# Patient Record
Sex: Female | Born: 1950 | Race: Black or African American | Hispanic: No | Smoking: Former smoker
Health system: Southern US, Community
[De-identification: ages and names within clinical notes are randomized; demographics above are authoritative.]

## PROBLEM LIST (undated history)

## (undated) DIAGNOSIS — E785 Hyperlipidemia, unspecified: Secondary | ICD-10-CM

## (undated) DIAGNOSIS — G459 Transient cerebral ischemic attack, unspecified: Secondary | ICD-10-CM

## (undated) DIAGNOSIS — I499 Cardiac arrhythmia, unspecified: Secondary | ICD-10-CM

## (undated) DIAGNOSIS — I509 Heart failure, unspecified: Secondary | ICD-10-CM

## (undated) DIAGNOSIS — I639 Cerebral infarction, unspecified: Secondary | ICD-10-CM

## (undated) DIAGNOSIS — G8929 Other chronic pain: Secondary | ICD-10-CM

## (undated) DIAGNOSIS — F419 Anxiety disorder, unspecified: Secondary | ICD-10-CM

## (undated) DIAGNOSIS — I10 Essential (primary) hypertension: Secondary | ICD-10-CM

## (undated) DIAGNOSIS — K219 Gastro-esophageal reflux disease without esophagitis: Secondary | ICD-10-CM

## (undated) DIAGNOSIS — J45909 Unspecified asthma, uncomplicated: Secondary | ICD-10-CM

## (undated) DIAGNOSIS — M545 Low back pain, unspecified: Secondary | ICD-10-CM

## (undated) DIAGNOSIS — J189 Pneumonia, unspecified organism: Secondary | ICD-10-CM

## (undated) DIAGNOSIS — M1990 Unspecified osteoarthritis, unspecified site: Secondary | ICD-10-CM

## (undated) HISTORY — PX: TONSILLECTOMY: SUR1361

## (undated) HISTORY — PX: PATELLA FRACTURE SURGERY: SHX735

## (undated) HISTORY — PX: FRACTURE SURGERY: SHX138

---

## 1978-10-20 HISTORY — PX: VAGINAL HYSTERECTOMY: SUR661

## 1997-09-07 ENCOUNTER — Other Ambulatory Visit: Admission: RE | Admit: 1997-09-07 | Discharge: 1997-09-07 | Payer: Self-pay | Admitting: Family Medicine

## 1998-01-01 ENCOUNTER — Emergency Department (HOSPITAL_COMMUNITY): Admission: EM | Admit: 1998-01-01 | Discharge: 1998-01-01 | Payer: Self-pay | Admitting: Emergency Medicine

## 1998-01-01 ENCOUNTER — Encounter: Payer: Self-pay | Admitting: Emergency Medicine

## 1999-11-09 ENCOUNTER — Emergency Department (HOSPITAL_COMMUNITY): Admission: EM | Admit: 1999-11-09 | Discharge: 1999-11-09 | Payer: Self-pay | Admitting: Emergency Medicine

## 2000-04-16 ENCOUNTER — Ambulatory Visit (HOSPITAL_COMMUNITY): Admission: RE | Admit: 2000-04-16 | Discharge: 2000-04-16 | Payer: Self-pay

## 2001-05-11 ENCOUNTER — Ambulatory Visit (HOSPITAL_COMMUNITY): Admission: RE | Admit: 2001-05-11 | Discharge: 2001-05-11 | Payer: Self-pay | Admitting: Family Medicine

## 2001-09-06 ENCOUNTER — Emergency Department (HOSPITAL_COMMUNITY): Admission: EM | Admit: 2001-09-06 | Discharge: 2001-09-06 | Payer: Self-pay | Admitting: *Deleted

## 2002-05-13 ENCOUNTER — Ambulatory Visit (HOSPITAL_COMMUNITY): Admission: RE | Admit: 2002-05-13 | Discharge: 2002-05-13 | Payer: Self-pay | Admitting: Family Medicine

## 2002-07-02 ENCOUNTER — Ambulatory Visit (HOSPITAL_BASED_OUTPATIENT_CLINIC_OR_DEPARTMENT_OTHER): Admission: RE | Admit: 2002-07-02 | Discharge: 2002-07-02 | Payer: Self-pay | Admitting: *Deleted

## 2002-07-02 ENCOUNTER — Encounter (INDEPENDENT_AMBULATORY_CARE_PROVIDER_SITE_OTHER): Payer: Self-pay | Admitting: *Deleted

## 2002-07-09 ENCOUNTER — Emergency Department (HOSPITAL_COMMUNITY): Admission: EM | Admit: 2002-07-09 | Discharge: 2002-07-09 | Payer: Self-pay | Admitting: Emergency Medicine

## 2002-09-10 ENCOUNTER — Ambulatory Visit (HOSPITAL_COMMUNITY): Admission: RE | Admit: 2002-09-10 | Discharge: 2002-09-10 | Payer: Self-pay | Admitting: Sports Medicine

## 2002-09-10 ENCOUNTER — Encounter: Admission: RE | Admit: 2002-09-10 | Discharge: 2002-09-10 | Payer: Self-pay | Admitting: Family Medicine

## 2003-05-16 ENCOUNTER — Ambulatory Visit (HOSPITAL_COMMUNITY): Admission: RE | Admit: 2003-05-16 | Discharge: 2003-05-16 | Payer: Self-pay | Admitting: Sports Medicine

## 2003-09-18 ENCOUNTER — Emergency Department (HOSPITAL_COMMUNITY): Admission: EM | Admit: 2003-09-18 | Discharge: 2003-09-18 | Payer: Self-pay | Admitting: Emergency Medicine

## 2003-11-03 ENCOUNTER — Ambulatory Visit: Payer: Self-pay | Admitting: Family Medicine

## 2003-12-02 ENCOUNTER — Ambulatory Visit: Payer: Self-pay | Admitting: Family Medicine

## 2004-05-17 ENCOUNTER — Ambulatory Visit (HOSPITAL_COMMUNITY): Admission: RE | Admit: 2004-05-17 | Discharge: 2004-05-17 | Payer: Self-pay | Admitting: Family Medicine

## 2005-05-21 ENCOUNTER — Ambulatory Visit (HOSPITAL_COMMUNITY): Admission: RE | Admit: 2005-05-21 | Discharge: 2005-05-21 | Payer: Self-pay | Admitting: Sports Medicine

## 2005-08-05 ENCOUNTER — Emergency Department (HOSPITAL_COMMUNITY): Admission: EM | Admit: 2005-08-05 | Discharge: 2005-08-05 | Payer: Self-pay | Admitting: Family Medicine

## 2005-08-23 ENCOUNTER — Encounter: Admission: RE | Admit: 2005-08-23 | Discharge: 2005-08-23 | Payer: Self-pay | Admitting: Orthopedic Surgery

## 2005-09-25 ENCOUNTER — Emergency Department (HOSPITAL_COMMUNITY): Admission: EM | Admit: 2005-09-25 | Discharge: 2005-09-25 | Payer: Self-pay | Admitting: Emergency Medicine

## 2005-09-27 ENCOUNTER — Inpatient Hospital Stay (HOSPITAL_COMMUNITY): Admission: RE | Admit: 2005-09-27 | Discharge: 2005-09-29 | Payer: Self-pay | Admitting: Orthopaedic Surgery

## 2005-11-15 ENCOUNTER — Ambulatory Visit (HOSPITAL_COMMUNITY): Admission: RE | Admit: 2005-11-15 | Discharge: 2005-11-16 | Payer: Self-pay | Admitting: Orthopaedic Surgery

## 2006-04-17 DIAGNOSIS — E7800 Pure hypercholesterolemia, unspecified: Secondary | ICD-10-CM | POA: Insufficient documentation

## 2006-04-17 DIAGNOSIS — I10 Essential (primary) hypertension: Secondary | ICD-10-CM | POA: Insufficient documentation

## 2006-04-17 DIAGNOSIS — J309 Allergic rhinitis, unspecified: Secondary | ICD-10-CM | POA: Insufficient documentation

## 2006-05-31 ENCOUNTER — Emergency Department (HOSPITAL_COMMUNITY): Admission: EM | Admit: 2006-05-31 | Discharge: 2006-05-31 | Payer: Self-pay | Admitting: Emergency Medicine

## 2006-06-17 ENCOUNTER — Ambulatory Visit: Payer: Self-pay | Admitting: Family Medicine

## 2006-06-17 ENCOUNTER — Telehealth: Payer: Self-pay | Admitting: *Deleted

## 2006-07-02 ENCOUNTER — Ambulatory Visit: Payer: Self-pay | Admitting: Family Medicine

## 2006-07-02 DIAGNOSIS — R1310 Dysphagia, unspecified: Secondary | ICD-10-CM | POA: Insufficient documentation

## 2006-07-02 DIAGNOSIS — K219 Gastro-esophageal reflux disease without esophagitis: Secondary | ICD-10-CM | POA: Insufficient documentation

## 2006-08-29 ENCOUNTER — Telehealth: Payer: Self-pay | Admitting: *Deleted

## 2006-08-29 ENCOUNTER — Ambulatory Visit: Payer: Self-pay | Admitting: Family Medicine

## 2008-01-20 ENCOUNTER — Ambulatory Visit (HOSPITAL_COMMUNITY): Admission: RE | Admit: 2008-01-20 | Discharge: 2008-01-20 | Payer: Self-pay | Admitting: Orthopaedic Surgery

## 2008-02-01 ENCOUNTER — Encounter: Admission: RE | Admit: 2008-02-01 | Discharge: 2008-02-01 | Payer: Self-pay | Admitting: Orthopaedic Surgery

## 2008-03-18 ENCOUNTER — Ambulatory Visit (HOSPITAL_COMMUNITY): Admission: RE | Admit: 2008-03-18 | Discharge: 2008-03-18 | Payer: Self-pay | Admitting: Orthopaedic Surgery

## 2008-06-27 ENCOUNTER — Emergency Department (HOSPITAL_COMMUNITY): Admission: EM | Admit: 2008-06-27 | Discharge: 2008-06-27 | Payer: Self-pay | Admitting: Emergency Medicine

## 2009-09-06 ENCOUNTER — Emergency Department (HOSPITAL_COMMUNITY): Admission: EM | Admit: 2009-09-06 | Discharge: 2009-09-06 | Payer: Self-pay | Admitting: Emergency Medicine

## 2010-01-03 ENCOUNTER — Encounter: Payer: Self-pay | Admitting: Family Medicine

## 2010-03-22 NOTE — Assessment & Plan Note (Signed)
Summary: sore throat/ls   Vital Signs:  Patient Profile:   60 Years Old Female Height:     66.5 inches Temp:     98.6 degrees F oral Pulse rate:   88 / minute BP sitting:   133 / 77  Vitals Entered By: Lillia Pauls CMA (August 29, 2006 2:23 PM)               PCP:  [name]  MD  Chief Complaint:  SORE THROAT/ALLERGIES/COUGH.  History of Present Illness: cough and allergy problems since Tuesday.  Sore throat also began tuesday.  No fever.  Occasional chills.   SORE THROAT  Onset:Tues Severity:8/10 Better with: ibuprofen  Symptoms  Fever: no    Highest Temp: Cough/URI sxs: yes Myalgias: no Headache: yes Rash: no Swollen glands:    only on r side   Location:  Recent Strep Exposure:  daughter had 1 month ago.  LUQ pain:stomach pains but went away on its own.  Heartburn/brash: reflux.  Allergy Sxs: yes  Red Flags STD exposure: no Breathing difficulty:no Drooling:no Trismus:no Pt is taking advair and BP meds, zyrtec this am.  Prilosec as needed.         Past Surgical History:    Reviewed history from 04/17/2006 and no changes required:       Hysterectomy & BSO - 02/19/1980, Sinus surgery - 09/19/2002   Family History:    Reviewed history from 04/17/2006 and no changes required:       Fam hx positive for alcoholism. Mother with HTN.  Social History:    Reviewed history from 04/17/2006 and no changes required:       Lives in [place] w/ husband.  Married X 12 yrs. 2 children in good health. Occasional EtOH, no smoking.  Works at [employer].     Physical Exam  General:     Well-developed,well-nourished,in no acute distress; alert,appropriate and cooperative throughout examination Head:     Normocephalic and atraumatic without obvious abnormalities. No apparent alopecia or balding. Eyes:     No corneal or conjunctival inflammation noted. EOMI. Perrla.  Ears:      External ear exam shows no significant lesions or deformities.  Otoscopic examination reveals clear canals, tympanic membranes are intact bilaterally without bulging, retraction, inflammation or discharge. Hearing is grossly normal bilaterally. Nose:     External nasal examination shows no deformity or inflammation. Nasal mucosa are pink and moist without lesions or exudates. Mouth:     Oral mucosa and oropharynx without lesions or exudates.  Teeth in good repair. Neck:     No deformities, masses, or tenderness noted. Lungs:     Normal respiratory effort, chest expands symmetrically. Lungs are clear to auscultation, no crackles or wheezes. Heart:     Normal rate and regular rhythm. S1 and S2 normal without gallop, murmur, click, rub or other extra sounds.    Impression & Recommendations:  Problem # 1:  RHINITIS, ALLERGIC (ICD-477.9) Most likely flareup of allergies since patient has been moving boxes.  will treat with allegra D and Ibuprofen for pain.  will have pt return in 1 week if no improvement.  see pt discharge instructions.  Medications prescribed.  No fever.  no signs of strep throat.  No noted peritonsillar abscess.   Orders: [facility]- Est Level  3 (04540)    Patient Instructions: 1)  Take an allegra D daily and ibuprofen every 8 hours for pain.  drink plenty of fluids.  get rest.  wear a mask  when around dust.  If no better by  middle of next week return for followup appt.

## 2010-03-22 NOTE — Progress Notes (Signed)
Summary: WI request  Phone Note Call from Patient Call back at Home Phone [phone]   Reason for Call: Talk to Nurse Summary of Call: pt wants to be seen for possible strep Initial call taken by: Haydee Salter,  August 29, 2006 10:06 AM  Follow-up for Phone Call        pt reports sore throat for 3 days. no fever. appointment scheduled today. Follow-up by: [name] RN,  August 29, 2006 10:45 AM

## 2010-03-22 NOTE — Miscellaneous (Signed)
   Clinical Lists Changes  Problems: Removed problem of ASTHMA, UNSPECIFIED (ICD-493.90) 

## 2010-03-22 NOTE — Assessment & Plan Note (Signed)
Summary: asthma   Vital Signs:  Patient Profile:   60 Years Old Female Height:     66.5 inches Weight:      159.5 pounds O2 Sat:      97 % Temp:     97.2 degrees F Pulse rate:   88 / minute BP sitting:   168 / 82  (left arm)  Pt. in pain?   no  Vitals Entered By: [name] RN (June 17, 2006 3:54 PM)              Is Patient Diabetic? No   Serial Vital Signs/Assessments:                                PEF    PreRx  PostRx Time      O2 Sat  O2 Type     L/min  L/min  L/min   By           97  %   Room air                          Shannon SMITH RN   PCP:  [name]  MD  Chief Complaint:  asthma  and cough.  History of Present Illness: cc: asthma exacerbatin HPI 60 yo female not seen in clinic since 11/2003 here w/ asthma exacerbation.   1)Asthma:  See following section 2)HTN:  Pt elevated BP today.  Has not taken BP meds in over a month.  Previously on benezepril and clonidine.  Wants to start back on meds.    Asthma History:  Type of asthma visit: Acute       The patient's current asthma symptoms started increasing about 06/15/2006.  This asthma flare has been triggered by: URI/cold.  She is now complaining of cough, nocturnal symptoms, wheezing, dyspnea/SOB, and sputum production.  She denies fever, itchy/watery eyes, nasal congestion/drainage, and sinus pressure.        Pt in ED 05/29/06 for flare-up and received breathing treatment.  Has not been on controller Flovent for over a year.  Has been using albuterol 7 times daily. Over past number of months has been waking up almost nightly with cough and has had many days with heavy albuterol use.  Albuterol has helped over past few days.    Asthma Severity Index Determination:      Asthma severity index questions reveal continual symptoms during the day and frequent nighttime symptoms.        Risk Factors:  Tobacco use:  quit   Review of Systems      See HPI   Physical Exam  General:      Well-developed,well-nourished,in no acute distress; alert,appropriate and cooperative throughout examination.  NAD Lungs:     Prior to treatment: RR 20no intercostal retractions, no accessory muscle use, R wheezes, and L wheezes. Fair air movement.  Faint coarse BS bases bilaterally.    AFter treatment:  RR 18 with improvement in air movement, wheezes are stable.  Pt appears more comfortable.   Heart:     Normal rate and regular rhythm. S1 and S2 normal without gallop, murmur, click, rub or other extra sounds.    Impression & Recommendations:  Problem # 1:  ASTHMA, UNSPECIFIED (ICD-493.90) Assessment: Deteriorated Pt was tight w/ moderate wheezing bilaterallly.  Received nebulizer treatment w/ Atrovent/Albuterol x 1.  After treatment pt feels less tight.  PE is improved with improved air movement. Pulse ox 97%  Pt states feels MUCH better after treatment.    Patient instructed to go to ED or Urgent Care if symtpoms worsen after office hours Taught pt how to use Advair 250/50 and gave sample. Will follow-up in 1-2 weeks with PCP.  If improves may get prescription.  Refilled Albuterol inhaler.  Prednisone 40 mg x 5 days.   Pt has spacer.  Pt will need baseline peakflow at next visit.   Orders: Pulse Oximetry- [facility] [phone])  Pulmonary Functions Reviewed: O2 sat: 97 (06/17/2006)  Orders: Pulse Oximetry- [facility] ([id number]) Nebulizer- [facility] ([id number])   Problem # 2:  HYPERTENSION, BENIGN SYSTEMIC (ICD-401.1) Assessment: Deteriorated Will start HCTZ 25 mg by mouth daily.  Will defer further meds including clonidine to her PCP.   BP today: 168/82   Problem # 3:  U R I (ICD-465.9) Assessment: New Supporative care with fluids and continued treatment of asthma Instructed on symptomatic treatment. Call if symptoms persist or worsen.   Other Orders: [facility]- Est  Level 4 (09811)   Patient Instructions: 1)  Please schedule a follow-up appointment in 1-2 weeks with Dr [name]).  2)  Use Advair Diskus as shown in clinic once in morning and once at night.  If it begins to run out before your appointment call clinic for prescription. 3)  Use Albuterol inhaler every 4-6 hours for next 1-2 days and then only as needed. 4)  Take Prednisone, two pills daily with meals.  (20mg  each) 5)  Take HCTZ 25 mg daily

## 2010-03-22 NOTE — Progress Notes (Signed)
Summary: wi request  Phone Note Call from Patient Call back at [phone]   Reason for Call: Talk to Nurse Summary of Call: pt is requesting wi appt, she sts she has asthma & is out of medication, pt has not been here in a while Initial call taken by: ERIN LEVAN,  June 17, 2006 2:54 PM  Follow-up for Phone Call        Appt Scheduled Today Follow-up by: Golden Circle RN,  June 17, 2006 3:02 PM

## 2010-06-04 LAB — CBC
HCT: 39.2 % (ref 36.0–46.0)
Hemoglobin: 13.1 g/dL (ref 12.0–15.0)
MCHC: 33.3 g/dL (ref 30.0–36.0)
MCV: 94.8 fL (ref 78.0–100.0)
Platelets: 266 10*3/uL (ref 150–400)
RBC: 4.13 MIL/uL (ref 3.87–5.11)
RDW: 14.7 % (ref 11.5–15.5)
WBC: 4.4 10*3/uL (ref 4.0–10.5)

## 2010-06-04 LAB — BASIC METABOLIC PANEL
BUN: 13 mg/dL (ref 6–23)
CO2: 25 mEq/L (ref 19–32)
Calcium: 9.8 mg/dL (ref 8.4–10.5)
Chloride: 110 mEq/L (ref 96–112)
Creatinine, Ser: 0.8 mg/dL (ref 0.4–1.2)
GFR calc Af Amer: >60 mL/min (ref >60)
GFR calc non Af Amer: >60 mL/min (ref >60)
Glucose, Bld: 87 mg/dL (ref 70–99)
Potassium: 3.8 mEq/L (ref 3.5–5.1)
Sodium: 144 mEq/L (ref 135–145)

## 2010-07-03 NOTE — Op Note (Signed)
Shannon [name], Shannon [name]            ACCOUNT NO.:  [id number]   MEDICAL RECORD NO.:  [id number]          PATIENT TYPE:  AMB   LOCATION:  SDS                          FACILITY:  [facility]   PHYSICIAN:  Vanita Panda. Magnus Ivan, M.D.DATE OF BIRTH:  [birth year]   DATE OF PROCEDURE:  03/18/2008  DATE OF DISCHARGE:  03/18/2008                               OPERATIVE REPORT   PREOPERATIVE DIAGNOSES:  1. Retained, painful prominent hardware, right patella status post      open reduction and internal fixation, right patella fracture.  2. Right knee patellofemoral chondromalacia.   POSTOPERATIVE DIAGNOSES:  1. Retained, painful prominent hardware, right patella status post      open reduction and internal fixation, right patella fracture.  2. Right knee patellofemoral chondromalacia.   PROCEDURE:  1. Removal of prominent screws, right patella x2.  2. Right knee arthroscopy with debridement and chondroplasty of the      patella.   SURGEON:  Vanita Panda. Magnus Ivan, MD   ANESTHESIA:  General.   ANTIBIOTICS:  One gram IV Ancef.   TOURNIQUET TIME:  57 minutes.   BLOOD LOSS:  Minimal.   COMPLICATIONS:  None.   INDICATIONS:  Briefly, Ms. Asmus is a 60 year old female who 2  years ago fell in a work-related accident landing on her right knee.  She had to undergo open reduction and internal fixation of patella  fracture.  She over the time has developed increasingly painful  chondromalacia of the patella with grinding of her knee.  Her x-ray  showed evidence that the patella fracture had healed completely.  There  was some painful prominence of hardware and she is a very thin  individual.  I recommended she undergo hardware removal with an  arthroscopic surgery of the knee to debride cartilage and smooth out  cartilage hanging from underneath the patella.  The risks and benefits  of surgery were explained to her in length and she agrees to surgery.   PROCEDURE:  After informed consent  was obtained, appropriate right leg  was marked.  She was brought to the operating room and placed supine on  the operating table.  General anesthesia was then obtained.  A  nonsterile tourniquet was placed around her upper right thigh and her  leg was prepped and draped with DuraPrep and sterile drapes including  sterile stockinette.  A lateral leg post was also utilized.  A time-out  was called to identify the correct patient and correct right knee.  I  then used an Esmarch to wrap out the leg and tourniquet was inflated to  300 mm pressure.  I made an incision directly over the previous incision  over the patella.  I was able to dissect down and identified 2 screw  heads that I removed easily without complications.  I then put the knee  through flexion and extension arc and patella was intact.  I debrided  areas of prominent osteophytes around the external aspect of the  patella.  I then proceeded with arthroscopic portion of the case.  Two  separate stab incisions were then made for arthroscopic portal  management.  An anterolateral portal was first placed and a camera was  inserted through this side.  I went directly the medial compartment and  made an anterior medial portal for arthroscopic shaver to be placed.  The medial compartment was then assessed and had just mild arthritic  changes.  The ACL and PCL were intact.  The lateral compartment had  grade 4 chondromalacia of the tibial plateau.  Finally, the undersurface  of the patella was assessed and there was significant grade 4  chondromalacia of patella.  Using an arthroscopic shaver, I was able to  debride back the cartilage to a stable margin and there were no further  flaps of cartilage hanging down and this did made the surface appear to  be more smooth.  I then removed all instrumentation from the knee and  drained the knee of an effusion.  I closed the portal sites with  interrupted 3-0 nylon suture.  I then closed deep  tissue of the knee  with 0 Vicryl, followed by 2-0 Vicryl in the subcutaneous tissue and  then a running 4-0 Monocryl subcuticular suture.  Steri-Strips were then  placed over this incision.  A mixture of clonidine, morphine, and  Marcaine was then inserted to the knee joint itself.  Sterile dressing  was then applied including sterile Ace wrap and tourniquet was let down  at 57 minutes and the toes pinked nicely.  The patient's knee was placed  in a knee immobilizer.  She was awakened, extubated, and taken to  recovery room in stable condition.  All final counts were correct.  There were no complications noted.      Vanita Panda. Magnus Ivan, M.D.  Electronically Signed     CYB/MEDQ  D:  03/18/2008  T:  03/18/2008  Job:  [id number]

## 2010-07-06 NOTE — Op Note (Signed)
NAMEMarland Kitchen  Shannon, [name] NO.:  [id number]   MEDICAL RECORD NO.:  [id number]          PATIENT TYPE:  OIB   LOCATION:  5028                         FACILITY:  [facility]   PHYSICIAN:  Vanita Panda. Magnus Ivan, M.D.DATE OF BIRTH:  01/20/1951   DATE OF PROCEDURE:  09/27/2005  DATE OF DISCHARGE:                                 OPERATIVE REPORT   PREOPERATIVE DIAGNOSIS:  Right comminuted patellar fracture.   POSTOPERATIVE DIAGNOSIS:  Right comminuted patellar fracture.   PROCEDURE:  1. Right knee partial patellectomy.  2. Right knee advancement of patellar tendon, with repair of retinaculum      and patellar tendon repair.   SURGEON:  Vanita Panda. Magnus Ivan, M.D.   ANESTHESIA:  General.   TOURNIQUET TIME:  1 hour and 36 minutes.   ANTIBIOTICS:  1 gram IV Ancef.   BLOOD LOSS:  Minimal.   COMPLICATIONS:  None.   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  She had the  inability to extend her knee and was seen in the emergency room by the ER  staff.  She was found to have a comminuted patellar fracture, that seemed to  involve more of the inferior pole of the patella involving 2 pieces within a  large portion of the patella remaining.  When I saw her in the office she  demonstrated the inability to extend her knee, and the x-rays did show the  aforementioned fracture.  It was recommended that she undergo attempted  repair versus a partial patellectomy with advancement of the patellar  tendon.  The risks and benefits of this were explained to her.  She agreed  to proceed with surgery.   PROCEDURE:  After informed consent was obtained and appropriate right knee  was marked, [name] was brought to the operating room and placed  supine on the operating table.  A nonsterile tourniquet was placed around  her upper leg.  General anesthesia was then obtained.  Her leg was  prepped  and draped with DuraPrep and sterile drapes, including a sterile  stockinette.  Before wrapping out her knee and inflating the tourniquet, she  was identified as the correct patient and the correct extremity.  The leg  was wrapped out with Esmarch and the tourniquet was inflated 300 mmHg  pressure.  A midline incision was then made directly over the patella,  carried down to the patellar tubercle; immediately a large hematoma was  noted.  There was extensive tearing of the retinaculum on both sides of her  knee and the large portion of the patella was remaining.  I did a medial  parapatellar arthrotomy, to be able to assess the undersurface of the  patella and the cartilage -- and the majority of the cartilage was intact.  There were two separate inferior pole pieces, and these could not be  repaired.  I excised some of the tissue, but left a remnant of bone for  advancement of the  patellar tendon.  I then ran a Krakow-type of suture  using a #2 FiberWire through the patellar tendon, with four strands of the  Krakow-type of suture.  I then placed 3 individual drill holes parallel  through the patella, at the mid side of the patella laterally, medially and  then the central part.  I then brought these sutures through 3 separate  drill holes, with 2 sutures through the medial middle drill hole and one  suture through each medial and lateral drill hole.  These were then tied  over a bony bridge at the superior pole of the patella to advance the  tendon.  I then oversewed the retinaculum on both sides of the knee with  interrupted 5-0 Ethibond suture, followed by #1 Ethibond suture to reinforce  the repair.  I then repaired several other small areas with 0 Vicryl suture.  The wound was copiously irrigated, and I did run fluid through the knee  before closing the arthrotomy with #1 Ethibond suture.  The soft tissues  were then irrigated again and I closed the deep soft tissues with 0  Vicryl,  followed by 2-0 Vicryl of the subcutaneous tissue, and staples on the skin.  Xerofoam followed by a well-padded sterile dressing was applied.  I then  placed her back in a knee immobilizer; the tourniquet was let down at 1 hour  and 36 minutes.  The toes did pinken nicely and there was palpable pulse in  her foot.  She was then awakened, extubated and taken to the recovery room  in stable condition.           ______________________________  Vanita Panda. Magnus Ivan, M.D.     CYB/MEDQ  D:  09/27/2005  T:  09/28/2005  Job:  [id number]

## 2010-07-06 NOTE — Op Note (Signed)
NAME:  Shannon [name], Shannon [name]                      ACCOUNT NO.:  [id number]   MEDICAL RECORD NO.:  [id number]                   PATIENT TYPE:  AMB   LOCATION:  DSC                                  FACILITY:  [facility]   PHYSICIAN:  Kathy Breach, M.D.                   DATE OF BIRTH:  [birth year]   DATE OF PROCEDURE:  07/02/2002  DATE OF DISCHARGE:                                 OPERATIVE REPORT   PREOPERATIVE DIAGNOSES:  1. Deviated nasal septum.  2. Hyperplasia, nasal turbinates.   POSTOPERATIVE DIAGNOSES:  1. Deviated nasal septum.  2. Hyperplasia, nasal turbinates.  3. Nasal sinus polyps.   PROCEDURES:  1. Nasal septoplasty.  2. Submucous resection, inferior turbinates bilaterally.  3. Bilateral intranasal ethmoidectomy for removal of nasal ethmoid polyps.   DESCRIPTION OF PROCEDURE:  With the patient under general orotracheal  anesthesia, a nasal block was applied with 4% Xylocaine with ephedrine-  soaked olive-tipped probes applied to the sphenopalatine and anterior  ethmoid nerve areas.  The patient's nose is completely obstructed by pale,  boggy congestion as well as marked deviation of the nasal septum to the  left.  Nose decongested more favorably than anticipated with the topical  block application.  The nasal septum was infiltrated with 1% Xylocaine and  1:100,000 epinephrine.  Cotton pledgets soaked with 4% Xylocaine and  ephedrine solution inserted along the turbinates, and later in the procedure  the inferior turbinates were infiltrated with 1% Xylocaine with 1:100,000  epinephrine for vasoconstrictive effect.  Once the patient's nose  significantly decongested, inspection revealed that there was sharp 4+  deviation of the anterior nasal septum of the quadrilateral cartilage to the  junction of the perpendicular ethmoid plate area to the left.  The middle  and inferior turbinates were markedly hyperplastic, right greater than left.  The patient also had polypoid changes  and some polyps visibly extending from  the middle meatus and carried out on the left side, visible after proceeding  with the septoplasty.  A caudal incision was made inside the left nasal  vestibule, and mucosal flaps were elevated off the cartilaginous and bony  septum bilaterally.  A couple of tears along the sharp deflections on the  left side occurred, but the right septum mucosa was kept entirely intact  without problem.  An incision was made removing the posterior third of the  quadrilateral cartilage, the perpendicular ethmoid plate, vomer and  maxillary crest spur, all extending markedly to the left, bringing the nasal  septum to the near-midline position and gaining visualization of the entire  nose on the left side.  A stab incision made over the anterior aspect of the  right inferior turbinate.  A superiorly-based mucosal flap was elevated, and  the lower two-thirds of the bone and the adjacent mucosa were sharply  excised.  Hemostasis with the suction cautery along the bony and mucosal  edges.  The remaining turbinate bone was outfractured.  A similar procedure  on the left side, decreasing the left inferior turbinate by 50%, somewhat  less than on the right.  At this point the obvious middle meatal polyps  presented on the left side, and marked polypoid degeneration of the mucosa  of the middle meatus of the right side was addressed, choosing to proceed  with removal of the polyps, extended into a complete nasal ethmoidectomy  being completed bilaterally, removing mucoid and polyps from the ethmoid  cells anteriorly and posteriorly bilaterally.  The middle turbinates, though  very more prominent right much more so than the left, were kept intact.  At  completion of the case the septal incision was closed with interrupted 4-0  chromic catgut sutures.  The nose was packed bilaterally with Vaseline  gauze, impregnated with bacitracin.  The patient's nasopharynx was suctioned   clear.  No active bleeding in the nasopharynx was present at the completion  of the case.  Blood loss in the suction canister greater than 50 to less  than 100 mL.  The patient tolerated the procedure well, was taken to the  recovery room in stable general condition.                                               Kathy Breach, M.D.    Shannon [name]  D:  07/02/2002  T:  07/03/2002  Job:  [id number]

## 2010-07-06 NOTE — Op Note (Signed)
Shannon [name], Shannon [name] NO.:  [id number]   MEDICAL RECORD NO.:  [id number]          PATIENT TYPE:  OIB   LOCATION:  5017                         FACILITY:  [facility]   PHYSICIAN:  Vanita Panda. Magnus Ivan, M.D.DATE OF BIRTH:  12-06-1950   DATE OF PROCEDURE:  11/15/2005  DATE OF DISCHARGE:  11/16/2005                                 OPERATIVE REPORT   PREOPERATIVE DIAGNOSIS:  Right patella fracture failed fixation.   POSTOPERATIVE DIAGNOSIS:  Right patella fracture failed fixation.   PROCEDURE:  Revision open reduction, internal fixation of right inferior  pole patella fracture.   SURGEON:  Vanita Panda. Magnus Ivan, M.D.   ANESTHESIA:  General.   ANTIBIOTICS:  1 gram IV Ancef.   BLOOD LOSS:  Minimal.   TOURNIQUET TIME:  1 hour and 45 minutes.   COMPLICATIONS:  None.   INDICATIONS:  Briefly, Ms. Botsford is a 60 year old, who around 6 weeks  ago, sustained a patella fracture in a fall.  This was of her right knee.  I  took her to the operating room and was able to advance the inferior pole of  patella piece through and with 3 drills and large #2 FiberWire suture, was  able to reapproximate the patellar tendon, and then I treated in knee  immobilization.  I did allow her to start bending the knee to just 30  degrees in hinged knee brace, but then she sustained a fall and came to my  office, and I found the inferior pole piece had pulled off completely.  I  recommended she undergo a partial patellectomy versus revision  osteosynthesis of the knee, and she agreed to proceed with surgery.   PROCEDURE DESCRIPTION:  After informed consent was obtained, the risks,  benefits, and surgery were explained to her in detail.  Her right leg was  marked, and she was brought to the operating room and placed supine on the  operating table.  A nonsterile tourniquet was placed around her upper leg,  and then her leg was prepped and draped with DuraPrep, and sterile drapes  including a sterile stockinette.  Prior to making an incision, a time-out  was called.  She was identified as the correct patient and the correct  extremity.  An Esmarch was used to wrap the leg, and tourniquet was inflated  to 300 mm of pressure.  I went through her original incision sharply with a  knife and carried this down to the level of the patella fracture, dividing  the soft tissue.  There was a gap at fracture site, and the 2 inferior pole  pieces had better consolidated.  I was able to curette the fracture site  itself and hopefully get to some bleeding bone.  I then was able to hold the  inferior pole pieces with reduction forceps and then placed 2 K-wires from  inferior to superior and over these drilled for 4-0 cannulated screws.  These were long partially threaded screws, and I placed two 4-0 cannulated  screws in a parallel format.  I then ran a FiberWire through these from a  tension band standpoint as well and  then oversewed the edges of the patellar  tendon.  I used fluoroscopy to assure that the screws did not penetrate the  articular surface.  I then thoroughly irrigated the wound, including  cleaning out a small arthrotomy that I had made to put my finger underneath  the patella to assure that it was a smooth surface.  The wound was then  copiously irrigated again, and I closed the arthrotomy sites with  interrupted 0 Vicryl suture followed by 0 Vicryl in the deep tissue, 2-0  Vicryl in the subcutaneous tissue, and staples on the skin.  Well-padded  sterile dressing was applied.  The patient's leg was placed in knee  immobilizer and tourniquet was let down.  The toes did pink nicely.  The  patient was awakened, extubated, and taken to recovery room in stable  condition.  Again, all final counts were correct, and there were no  complications noted.           ______________________________  Vanita Panda. Magnus Ivan, M.D.     CYB/MEDQ  D:  11/15/2005  T:  11/17/2005   Job:  [id number]

## 2010-08-10 ENCOUNTER — Emergency Department (HOSPITAL_COMMUNITY)
Admission: EM | Admit: 2010-08-10 | Discharge: 2010-08-11 | Disposition: A | Discharge status: Home / Self Care | Payer: Self-pay | Attending: Emergency Medicine | Admitting: Emergency Medicine

## 2010-08-10 DIAGNOSIS — I10 Essential (primary) hypertension: Secondary | ICD-10-CM | POA: Insufficient documentation

## 2010-08-10 DIAGNOSIS — R0602 Shortness of breath: Secondary | ICD-10-CM | POA: Insufficient documentation

## 2010-08-10 DIAGNOSIS — R0609 Other forms of dyspnea: Secondary | ICD-10-CM | POA: Insufficient documentation

## 2010-08-10 DIAGNOSIS — R0789 Other chest pain: Secondary | ICD-10-CM | POA: Insufficient documentation

## 2010-08-10 DIAGNOSIS — J45909 Unspecified asthma, uncomplicated: Secondary | ICD-10-CM | POA: Insufficient documentation

## 2010-08-10 DIAGNOSIS — R0989 Other specified symptoms and signs involving the circulatory and respiratory systems: Secondary | ICD-10-CM | POA: Insufficient documentation

## 2010-12-04 ENCOUNTER — Other Ambulatory Visit: Payer: Self-pay | Admitting: Internal Medicine

## 2010-12-04 DIAGNOSIS — Z1231 Encounter for screening mammogram for malignant neoplasm of breast: Secondary | ICD-10-CM

## 2010-12-20 ENCOUNTER — Ambulatory Visit (HOSPITAL_COMMUNITY)
Admission: RE | Admit: 2010-12-20 | Discharge: 2010-12-20 | Disposition: A | Discharge status: Home / Self Care | Payer: Self-pay | Source: Ambulatory Visit | Attending: Internal Medicine | Admitting: Internal Medicine

## 2010-12-20 DIAGNOSIS — Z1231 Encounter for screening mammogram for malignant neoplasm of breast: Secondary | ICD-10-CM | POA: Insufficient documentation

## 2012-01-09 ENCOUNTER — Other Ambulatory Visit (HOSPITAL_COMMUNITY): Payer: Self-pay | Admitting: Internal Medicine

## 2012-01-13 ENCOUNTER — Other Ambulatory Visit (HOSPITAL_COMMUNITY): Payer: Self-pay | Admitting: Internal Medicine

## 2012-01-13 DIAGNOSIS — Z1231 Encounter for screening mammogram for malignant neoplasm of breast: Secondary | ICD-10-CM

## 2012-01-30 ENCOUNTER — Ambulatory Visit (HOSPITAL_COMMUNITY)
Admission: RE | Admit: 2012-01-30 | Discharge: 2012-01-30 | Disposition: A | Discharge status: Home / Self Care | Payer: Self-pay | Source: Ambulatory Visit | Attending: Internal Medicine | Admitting: Internal Medicine

## 2012-01-30 DIAGNOSIS — Z1231 Encounter for screening mammogram for malignant neoplasm of breast: Secondary | ICD-10-CM

## 2012-09-20 ENCOUNTER — Emergency Department (HOSPITAL_COMMUNITY): Payer: No Typology Code available for payment source

## 2012-09-20 ENCOUNTER — Encounter (HOSPITAL_COMMUNITY): Payer: Self-pay | Admitting: *Deleted

## 2012-09-20 ENCOUNTER — Emergency Department (HOSPITAL_COMMUNITY)
Admission: EM | Admit: 2012-09-20 | Discharge: 2012-09-21 | Disposition: A | Discharge status: Home / Self Care | Payer: No Typology Code available for payment source | Attending: Emergency Medicine | Admitting: Emergency Medicine

## 2012-09-20 DIAGNOSIS — J45909 Unspecified asthma, uncomplicated: Secondary | ICD-10-CM | POA: Insufficient documentation

## 2012-09-20 DIAGNOSIS — I10 Essential (primary) hypertension: Secondary | ICD-10-CM | POA: Insufficient documentation

## 2012-09-20 DIAGNOSIS — J40 Bronchitis, not specified as acute or chronic: Secondary | ICD-10-CM | POA: Insufficient documentation

## 2012-09-20 HISTORY — DX: Unspecified asthma, uncomplicated: J45.909

## 2012-09-20 HISTORY — DX: Essential (primary) hypertension: I10

## 2012-09-20 MED ORDER — IPRATROPIUM BROMIDE HFA 17 MCG/ACT IN AERS
2.0000 | INHALATION_SPRAY | Freq: Once | RESPIRATORY_TRACT | Status: DC
  Filled 2012-09-20: qty 12.9

## 2012-09-20 MED ORDER — PREDNISONE 20 MG PO TABS
60.0000 mg | ORAL_TABLET | Freq: Once | ORAL | Status: AC
  Administered 2012-09-21: 60 mg via ORAL
  Filled 2012-09-20: qty 3

## 2012-09-20 MED ORDER — ALBUTEROL SULFATE (5 MG/ML) 0.5% IN NEBU
5.0000 mg | INHALATION_SOLUTION | Freq: Once | RESPIRATORY_TRACT | Status: DC
  Filled 2012-09-20: qty 1

## 2012-09-20 MED ORDER — ALBUTEROL SULFATE (5 MG/ML) 0.5% IN NEBU
5.0000 mg | INHALATION_SOLUTION | Freq: Once | RESPIRATORY_TRACT | Status: AC
  Administered 2012-09-21: 5 mg via RESPIRATORY_TRACT
  Filled 2012-09-20 (×2): qty 1

## 2012-09-20 NOTE — ED Notes (Signed)
Hx asthma. Currently out of medication

## 2012-09-20 NOTE — ED Notes (Signed)
Pt c/o SOB x 3 days progressively worsening, with productive cough.

## 2012-09-20 NOTE — ED Provider Notes (Signed)
CSN: [id number]     Arrival date & time 09/20/12  2311 History     First MD Initiated Contact with Patient 09/20/12 2327     Chief Complaint  Patient presents with  . Shortness of Breath   HPI  History provided by the patient. Patient is a 62 year old female with history of hypertension, hypercholesterolemia and asthma who presents with complaints of worsening cough, shortness of breath and wheezing for the past 3 days. Patient reports running out of her albuterol and Advair inhalers most 3 weeks ago. She has done generally well until the last 3 days. She has had a productive clear to yellow phlegm cough without hemoptysis. She denies significant nasal congestion or rhinorrhea. No sore throat. No associated fever, chills or sweats. She has not used any other treatments for symptoms. Denies any other aggravating or alleviating factors. No other associated symptoms. No chest pains, nausea, vomiting or diarrhea. No recent travel. No known sick contacts.   Past Medical History  Diagnosis Date  . Hypertension   . Asthma    No past surgical history on file. No family history on file. History  Substance Use Topics  . Smoking status: Not on file  . Smokeless tobacco: Not on file  . Alcohol Use: Not on file   OB History   Grav Para Term Preterm Abortions TAB SAB Ect Mult Living                 Review of Systems  Constitutional: Negative for fever, chills and unexpected weight change.  HENT: Negative for congestion, sore throat and rhinorrhea.   Respiratory: Positive for cough, shortness of breath and wheezing.   Cardiovascular: Negative for chest pain and palpitations.  Gastrointestinal: Negative for nausea, vomiting, abdominal pain and diarrhea.  All other systems reviewed and are negative.    Allergies  Review of patient's allergies indicates no known allergies.  Home Medications  No current outpatient prescriptions on file. BP 153/88  Pulse 93  Temp(Src) 97.7 F (36.5 C)  (Oral)  Resp 28  SpO2 97% Physical Exam  Nursing note and vitals reviewed. Constitutional: She is oriented to person, place, and time. She appears well-developed and well-nourished. No distress.  HENT:  Head: Normocephalic.  Mouth/Throat: Oropharynx is clear and moist.  Neck: Normal range of motion. Neck supple.  No meningeal signs.  Cardiovascular: Normal rate and regular rhythm.   Pulmonary/Chest: Effort normal. She has wheezes.  Expiratory wheezes  Abdominal: Soft. There is no tenderness. There is no rebound and no guarding.  Neurological: She is alert and oriented to person, place, and time.  Skin: Skin is warm and dry. No rash noted.  Psychiatric: She has a normal mood and affect. Her behavior is normal.    ED Course   Procedures     Dg Chest 2 View  09/20/2012   *RADIOLOGY REPORT*  Clinical Data: Shortness of breath, cough, asthma  CHEST - 2 VIEW  Comparison: 03/16/2008  Findings: Stable mild hyperinflation.  Normal heart size and vascularity.  No CHF, pneumonia, effusion or pneumothorax.  Trachea midline.  IMPRESSION: Stable hyperinflation.   Original Report Authenticated By: Judie Petit. Shick, M.D.      1. Bronchitis     MDM  11:25PM patient seen and evaluated. Patient with frequent occasional coughing. Appears well otherwise without any respiratory distress. Expiratory wheezing throughout. No other concerning symptoms.  Ran out of albuterol nad advair 3 wks ago  Pt feeling better after breathing treatment.  CXR without concerns for  PNA at this time. Pt does feel a second treatment would help and I will order.      Angus Seller, PA-C 09/21/12 [phone]

## 2012-09-21 ENCOUNTER — Encounter (HOSPITAL_COMMUNITY): Payer: Self-pay | Admitting: *Deleted

## 2012-09-21 MED ORDER — ALBUTEROL SULFATE HFA 108 (90 BASE) MCG/ACT IN AERS: 2.0000 | INHALATION_SPRAY | RESPIRATORY_TRACT | Status: DC | PRN

## 2012-09-21 MED ORDER — IPRATROPIUM BROMIDE 0.02 % IN SOLN
0.5000 mg | Freq: Once | RESPIRATORY_TRACT | Status: AC
  Administered 2012-09-21: 0.5 mg via RESPIRATORY_TRACT
  Filled 2012-09-21: qty 2.5

## 2012-09-21 MED ORDER — FLUTICASONE-SALMETEROL 250-50 MCG/DOSE IN AEPB: 1.0000 | INHALATION_SPRAY | Freq: Two times a day (BID) | RESPIRATORY_TRACT | Status: DC

## 2012-09-21 MED ORDER — ALBUTEROL SULFATE (5 MG/ML) 0.5% IN NEBU
5.0000 mg | INHALATION_SOLUTION | Freq: Once | RESPIRATORY_TRACT | Status: AC
  Administered 2012-09-21: 5 mg via RESPIRATORY_TRACT
  Filled 2012-09-21 (×2): qty 1

## 2012-09-21 NOTE — ED Provider Notes (Signed)
Medical screening examination/treatment/procedure(s) were performed by non-physician practitioner and as supervising physician I was immediately available for consultation/collaboration.  Jones Skene, M.D.     Jones Skene, MD 09/21/12 1610

## 2012-11-13 ENCOUNTER — Ambulatory Visit: Payer: No Typology Code available for payment source | Admitting: Cardiovascular Disease

## 2012-12-09 ENCOUNTER — Ambulatory Visit: Payer: No Typology Code available for payment source | Admitting: Cardiovascular Disease

## 2012-12-22 ENCOUNTER — Encounter: Payer: Self-pay | Admitting: Cardiovascular Disease

## 2013-01-07 ENCOUNTER — Other Ambulatory Visit (HOSPITAL_COMMUNITY): Payer: Self-pay | Admitting: Family Medicine

## 2013-01-07 DIAGNOSIS — Z1231 Encounter for screening mammogram for malignant neoplasm of breast: Secondary | ICD-10-CM

## 2013-02-01 ENCOUNTER — Ambulatory Visit (HOSPITAL_COMMUNITY)
Admission: RE | Admit: 2013-02-01 | Discharge: 2013-02-01 | Disposition: A | Discharge status: Home / Self Care | Payer: No Typology Code available for payment source | Source: Ambulatory Visit | Attending: Family Medicine | Admitting: Family Medicine

## 2013-02-01 DIAGNOSIS — Z1231 Encounter for screening mammogram for malignant neoplasm of breast: Secondary | ICD-10-CM | POA: Insufficient documentation

## 2014-01-26 ENCOUNTER — Other Ambulatory Visit (HOSPITAL_COMMUNITY): Payer: Self-pay | Admitting: Nurse Practitioner

## 2014-01-26 DIAGNOSIS — Z1231 Encounter for screening mammogram for malignant neoplasm of breast: Secondary | ICD-10-CM

## 2014-02-04 ENCOUNTER — Ambulatory Visit (HOSPITAL_COMMUNITY)
Admission: RE | Admit: 2014-02-04 | Discharge: 2014-02-04 | Disposition: A | Discharge status: Home / Self Care | Payer: No Typology Code available for payment source | Source: Ambulatory Visit | Attending: Nurse Practitioner | Admitting: Nurse Practitioner

## 2014-02-04 DIAGNOSIS — Z1231 Encounter for screening mammogram for malignant neoplasm of breast: Secondary | ICD-10-CM | POA: Diagnosis present

## 2014-05-26 ENCOUNTER — Emergency Department (HOSPITAL_COMMUNITY): Payer: Medicare HMO

## 2014-05-26 ENCOUNTER — Inpatient Hospital Stay (HOSPITAL_COMMUNITY): Payer: Medicare HMO

## 2014-05-26 ENCOUNTER — Encounter (HOSPITAL_COMMUNITY): Payer: Self-pay | Admitting: *Deleted

## 2014-05-26 ENCOUNTER — Inpatient Hospital Stay (HOSPITAL_COMMUNITY)
Admission: EM | Admit: 2014-05-26 | Discharge: 2014-05-27 | DRG: 392 | Disposition: A | Discharge status: Home / Self Care | Payer: Medicare HMO | Attending: Internal Medicine | Admitting: Internal Medicine

## 2014-05-26 DIAGNOSIS — J45909 Unspecified asthma, uncomplicated: Secondary | ICD-10-CM | POA: Diagnosis present

## 2014-05-26 DIAGNOSIS — I10 Essential (primary) hypertension: Secondary | ICD-10-CM | POA: Diagnosis present

## 2014-05-26 DIAGNOSIS — R0789 Other chest pain: Secondary | ICD-10-CM | POA: Diagnosis present

## 2014-05-26 DIAGNOSIS — E7800 Pure hypercholesterolemia, unspecified: Secondary | ICD-10-CM | POA: Diagnosis present

## 2014-05-26 DIAGNOSIS — R197 Diarrhea, unspecified: Secondary | ICD-10-CM | POA: Diagnosis present

## 2014-05-26 DIAGNOSIS — F10129 Alcohol abuse with intoxication, unspecified: Secondary | ICD-10-CM | POA: Diagnosis present

## 2014-05-26 DIAGNOSIS — F10120 Alcohol abuse with intoxication, uncomplicated: Secondary | ICD-10-CM | POA: Diagnosis not present

## 2014-05-26 DIAGNOSIS — E876 Hypokalemia: Secondary | ICD-10-CM | POA: Diagnosis present

## 2014-05-26 DIAGNOSIS — K210 Gastro-esophageal reflux disease with esophagitis: Secondary | ICD-10-CM | POA: Diagnosis present

## 2014-05-26 DIAGNOSIS — M545 Low back pain: Secondary | ICD-10-CM | POA: Diagnosis present

## 2014-05-26 DIAGNOSIS — M1990 Unspecified osteoarthritis, unspecified site: Secondary | ICD-10-CM | POA: Diagnosis present

## 2014-05-26 DIAGNOSIS — K222 Esophageal obstruction: Principal | ICD-10-CM | POA: Diagnosis present

## 2014-05-26 DIAGNOSIS — J309 Allergic rhinitis, unspecified: Secondary | ICD-10-CM | POA: Diagnosis present

## 2014-05-26 DIAGNOSIS — E860 Dehydration: Secondary | ICD-10-CM | POA: Diagnosis present

## 2014-05-26 DIAGNOSIS — A084 Viral intestinal infection, unspecified: Secondary | ICD-10-CM | POA: Diagnosis present

## 2014-05-26 DIAGNOSIS — R1310 Dysphagia, unspecified: Secondary | ICD-10-CM | POA: Diagnosis present

## 2014-05-26 DIAGNOSIS — K449 Diaphragmatic hernia without obstruction or gangrene: Secondary | ICD-10-CM | POA: Diagnosis present

## 2014-05-26 DIAGNOSIS — E785 Hyperlipidemia, unspecified: Secondary | ICD-10-CM | POA: Diagnosis present

## 2014-05-26 DIAGNOSIS — F10929 Alcohol use, unspecified with intoxication, unspecified: Secondary | ICD-10-CM | POA: Diagnosis present

## 2014-05-26 DIAGNOSIS — K219 Gastro-esophageal reflux disease without esophagitis: Secondary | ICD-10-CM | POA: Diagnosis present

## 2014-05-26 DIAGNOSIS — R079 Chest pain, unspecified: Secondary | ICD-10-CM | POA: Diagnosis not present

## 2014-05-26 DIAGNOSIS — N179 Acute kidney failure, unspecified: Secondary | ICD-10-CM | POA: Diagnosis present

## 2014-05-26 DIAGNOSIS — F10920 Alcohol use, unspecified with intoxication, uncomplicated: Secondary | ICD-10-CM

## 2014-05-26 HISTORY — DX: Low back pain: M54.5

## 2014-05-26 HISTORY — DX: Unspecified osteoarthritis, unspecified site: M19.90

## 2014-05-26 HISTORY — DX: Other chronic pain: G89.29

## 2014-05-26 HISTORY — DX: Hyperlipidemia, unspecified: E78.5

## 2014-05-26 HISTORY — DX: Gastro-esophageal reflux disease without esophagitis: K21.9

## 2014-05-26 HISTORY — DX: Low back pain, unspecified: M54.50

## 2014-05-26 LAB — COMPREHENSIVE METABOLIC PANEL
ALT: 18 U/L (ref 0–35)
AST: 21 U/L (ref 0–37)
Albumin: 3.5 g/dL (ref 3.5–5.2)
Alkaline Phosphatase: 94 U/L (ref 39–117)
Anion gap: 12 (ref 5–15)
BUN: 5 mg/dL — ABNORMAL LOW (ref 6–23)
CO2: 22 mmol/L (ref 19–32)
Calcium: 8.8 mg/dL (ref 8.4–10.5)
Chloride: 108 mmol/L (ref 96–112)
Creatinine, Ser: 0.98 mg/dL (ref 0.50–1.10)
GFR calc Af Amer: 69 mL/min — ABNORMAL LOW (ref >90)
GFR calc non Af Amer: 59 mL/min — ABNORMAL LOW (ref >90)
Glucose, Bld: 108 mg/dL — ABNORMAL HIGH (ref 70–99)
Potassium: 3.3 mmol/L — ABNORMAL LOW (ref 3.5–5.1)
Sodium: 142 mmol/L (ref 135–145)
Total Bilirubin: 0.6 mg/dL (ref 0.3–1.2)
Total Protein: 6.6 g/dL (ref 6.0–8.3)

## 2014-05-26 LAB — TROPONIN I
Troponin I: <0.03 ng/mL (ref <0.031)
Troponin I: <0.03 ng/mL (ref <0.031)
Troponin I: <0.03 ng/mL (ref <0.031)

## 2014-05-26 LAB — CBC
HCT: 37 % (ref 36.0–46.0)
HCT: 41.5 % (ref 36.0–46.0)
Hemoglobin: 12.2 g/dL (ref 12.0–15.0)
Hemoglobin: 13.8 g/dL (ref 12.0–15.0)
MCH: 30 pg (ref 26.0–34.0)
MCH: 30.1 pg (ref 26.0–34.0)
MCHC: 33 g/dL (ref 30.0–36.0)
MCHC: 33.3 g/dL (ref 30.0–36.0)
MCV: 90.4 fL (ref 78.0–100.0)
MCV: 90.9 fL (ref 78.0–100.0)
Platelets: 263 10*3/uL (ref 150–400)
Platelets: 306 10*3/uL (ref 150–400)
RBC: 4.07 MIL/uL (ref 3.87–5.11)
RBC: 4.59 MIL/uL (ref 3.87–5.11)
RDW: 13.9 % (ref 11.5–15.5)
RDW: 14 % (ref 11.5–15.5)
WBC: 5.2 10*3/uL (ref 4.0–10.5)
WBC: 5.8 10*3/uL (ref 4.0–10.5)

## 2014-05-26 LAB — BASIC METABOLIC PANEL
Anion gap: 16 — ABNORMAL HIGH (ref 5–15)
BUN: 7 mg/dL (ref 6–23)
CO2: 19 mmol/L (ref 19–32)
Calcium: 9 mg/dL (ref 8.4–10.5)
Chloride: 102 mmol/L (ref 96–112)
Creatinine, Ser: 1.2 mg/dL — ABNORMAL HIGH (ref 0.50–1.10)
GFR calc Af Amer: 54 mL/min — ABNORMAL LOW (ref >90)
GFR calc non Af Amer: 46 mL/min — ABNORMAL LOW (ref >90)
Glucose, Bld: 143 mg/dL — ABNORMAL HIGH (ref 70–99)
Potassium: 2.8 mmol/L — ABNORMAL LOW (ref 3.5–5.1)
Sodium: 137 mmol/L (ref 135–145)

## 2014-05-26 LAB — ETHANOL: Alcohol, Ethyl (B): 231 mg/dL — ABNORMAL HIGH (ref 0–9)

## 2014-05-26 LAB — RAPID URINE DRUG SCREEN, HOSP PERFORMED
Amphetamines: NOT DETECTED
Barbiturates: NOT DETECTED
Benzodiazepines: NOT DETECTED
Cocaine: NOT DETECTED
Opiates: NOT DETECTED
Tetrahydrocannabinol: NOT DETECTED

## 2014-05-26 LAB — LIPID PANEL
Cholesterol: 174 mg/dL (ref 0–200)
HDL: 59 mg/dL (ref >39)
LDL Cholesterol: 86 mg/dL (ref 0–99)
Total CHOL/HDL Ratio: 2.9 RATIO
Triglycerides: 145 mg/dL (ref <150)
VLDL: 29 mg/dL (ref 0–40)

## 2014-05-26 LAB — MAGNESIUM: Magnesium: 1.9 mg/dL (ref 1.5–2.5)

## 2014-05-26 LAB — PROTIME-INR
INR: 1.03 (ref 0.00–1.49)
Prothrombin Time: 13.7 seconds (ref 11.6–15.2)

## 2014-05-26 LAB — SODIUM, URINE, RANDOM: Sodium, Ur: 140 mmol/L

## 2014-05-26 LAB — CBG MONITORING, ED: Glucose-Capillary: 138 mg/dL — ABNORMAL HIGH (ref 70–99)

## 2014-05-26 LAB — HIV ANTIBODY (ROUTINE TESTING W REFLEX): HIV Screen 4th Generation wRfx: NONREACTIVE

## 2014-05-26 LAB — I-STAT TROPONIN, ED: Troponin i, poc: 0 ng/mL (ref 0.00–0.08)

## 2014-05-26 LAB — CREATININE, URINE, RANDOM: Creatinine, Urine: 98.86 mg/dL

## 2014-05-26 MED ORDER — LORAZEPAM 2 MG/ML IJ SOLN: 0.0000 mg | Freq: Four times a day (QID) | INTRAMUSCULAR | Status: DC

## 2014-05-26 MED ORDER — NITROGLYCERIN 0.4 MG SL SUBL: 0.4000 mg | SUBLINGUAL_TABLET | SUBLINGUAL | Status: DC | PRN

## 2014-05-26 MED ORDER — VITAMIN B-1 100 MG PO TABS
100.0000 mg | ORAL_TABLET | Freq: Every day | ORAL | Status: DC
  Administered 2014-05-26 – 2014-05-27 (×2): 100 mg via ORAL
  Filled 2014-05-26 (×2): qty 1

## 2014-05-26 MED ORDER — ADULT MULTIVITAMIN W/MINERALS CH
1.0000 | ORAL_TABLET | Freq: Every day | ORAL | Status: DC
  Administered 2014-05-26 – 2014-05-27 (×2): 1 via ORAL
  Filled 2014-05-26 (×2): qty 1

## 2014-05-26 MED ORDER — MOMETASONE FURO-FORMOTEROL FUM 100-5 MCG/ACT IN AERO
2.0000 | INHALATION_SPRAY | Freq: Two times a day (BID) | RESPIRATORY_TRACT | Status: DC
  Administered 2014-05-26: 2 via RESPIRATORY_TRACT
  Filled 2014-05-26: qty 8.8

## 2014-05-26 MED ORDER — PANTOPRAZOLE SODIUM 40 MG PO TBEC
40.0000 mg | DELAYED_RELEASE_TABLET | Freq: Every day | ORAL | Status: DC
  Administered 2014-05-26 – 2014-05-27 (×2): 40 mg via ORAL
  Filled 2014-05-26 (×2): qty 1

## 2014-05-26 MED ORDER — POTASSIUM CHLORIDE CRYS ER 20 MEQ PO TBCR
40.0000 meq | EXTENDED_RELEASE_TABLET | Freq: Once | ORAL | Status: AC
  Administered 2014-05-26: 40 meq via ORAL
  Filled 2014-05-26: qty 2

## 2014-05-26 MED ORDER — MORPHINE SULFATE 2 MG/ML IJ SOLN: 2.0000 mg | INTRAMUSCULAR | Status: DC | PRN

## 2014-05-26 MED ORDER — ONDANSETRON HCL 4 MG/2ML IJ SOLN: 4.0000 mg | Freq: Four times a day (QID) | INTRAMUSCULAR | Status: DC | PRN

## 2014-05-26 MED ORDER — ONDANSETRON HCL 4 MG PO TABS: 4.0000 mg | ORAL_TABLET | Freq: Four times a day (QID) | ORAL | Status: DC | PRN

## 2014-05-26 MED ORDER — ASPIRIN EC 81 MG PO TBEC
81.0000 mg | DELAYED_RELEASE_TABLET | Freq: Every day | ORAL | Status: DC
  Administered 2014-05-26 – 2014-05-27 (×2): 81 mg via ORAL
  Filled 2014-05-26 (×2): qty 1

## 2014-05-26 MED ORDER — LORAZEPAM 2 MG/ML IJ SOLN: 0.0000 mg | Freq: Two times a day (BID) | INTRAMUSCULAR | Status: DC

## 2014-05-26 MED ORDER — SODIUM CHLORIDE 0.9 % IV SOLN
INTRAVENOUS | Status: DC
  Administered 2014-05-26 (×2): via INTRAVENOUS

## 2014-05-26 MED ORDER — SODIUM CHLORIDE 0.9 % IJ SOLN
3.0000 mL | Freq: Two times a day (BID) | INTRAMUSCULAR | Status: DC
  Administered 2014-05-26: 3 mL via INTRAVENOUS

## 2014-05-26 MED ORDER — PNEUMOCOCCAL VAC POLYVALENT 25 MCG/0.5ML IJ INJ
0.5000 mL | INJECTION | INTRAMUSCULAR | Status: AC
  Administered 2014-05-27: 0.5 mL via INTRAMUSCULAR
  Filled 2014-05-26: qty 0.5

## 2014-05-26 MED ORDER — ALBUTEROL SULFATE (2.5 MG/3ML) 0.083% IN NEBU: 2.5000 mg | INHALATION_SOLUTION | RESPIRATORY_TRACT | Status: DC | PRN

## 2014-05-26 MED ORDER — FOLIC ACID 1 MG PO TABS
1.0000 mg | ORAL_TABLET | Freq: Every day | ORAL | Status: DC
  Administered 2014-05-26 – 2014-05-27 (×2): 1 mg via ORAL
  Filled 2014-05-26 (×2): qty 1

## 2014-05-26 MED ORDER — POTASSIUM CHLORIDE 10 MEQ/100ML IV SOLN
10.0000 meq | Freq: Once | INTRAVENOUS | Status: DC
  Filled 2014-05-26: qty 100

## 2014-05-26 MED ORDER — HEPARIN SODIUM (PORCINE) 5000 UNIT/ML IJ SOLN
5000.0000 [IU] | Freq: Three times a day (TID) | INTRAMUSCULAR | Status: DC
  Administered 2014-05-26 (×2): 5000 [IU] via SUBCUTANEOUS
  Filled 2014-05-26 (×6): qty 1

## 2014-05-26 MED ORDER — ATORVASTATIN CALCIUM 20 MG PO TABS
20.0000 mg | ORAL_TABLET | Freq: Every day | ORAL | Status: DC
  Administered 2014-05-26: 20 mg via ORAL
  Filled 2014-05-26 (×2): qty 1

## 2014-05-26 MED ORDER — AMLODIPINE BESYLATE 10 MG PO TABS
10.0000 mg | ORAL_TABLET | Freq: Every day | ORAL | Status: DC
  Administered 2014-05-26 – 2014-05-27 (×2): 10 mg via ORAL
  Filled 2014-05-26 (×2): qty 1

## 2014-05-26 MED ORDER — WHITE PETROLATUM GEL
Status: DC | PRN
  Administered 2014-05-26: 0.2 via TOPICAL
  Filled 2014-05-26 (×2): qty 28.35

## 2014-05-26 MED ORDER — LORAZEPAM 1 MG PO TABS: 1.0000 mg | ORAL_TABLET | Freq: Four times a day (QID) | ORAL | Status: DC | PRN

## 2014-05-26 MED ORDER — LORAZEPAM 2 MG/ML IJ SOLN: 1.0000 mg | Freq: Four times a day (QID) | INTRAMUSCULAR | Status: DC | PRN

## 2014-05-26 MED ORDER — HYDRALAZINE HCL 20 MG/ML IJ SOLN
5.0000 mg | INTRAMUSCULAR | Status: DC | PRN
Start: 2014-05-26 — End: 2014-05-27

## 2014-05-26 MED ORDER — ALUM & MAG HYDROXIDE-SIMETH 200-200-20 MG/5ML PO SUSP: 30.0000 mL | Freq: Four times a day (QID) | ORAL | Status: DC | PRN

## 2014-05-26 MED ORDER — THIAMINE HCL 100 MG/ML IJ SOLN
100.0000 mg | Freq: Every day | INTRAMUSCULAR | Status: DC
  Filled 2014-05-26 (×2): qty 1

## 2014-05-26 NOTE — Progress Notes (Signed)
Speech Language Pathology :   Patient Details Name: Shannon [name] MRN: [id number] DOB: 12-02-1950 Today's Date: 05/26/2014 Time:  -    Received order for MBS, spoke with MD who clarified pt needs barium esophagram and no further ST at this time.  Marcene Duos MA, CCC-SLP Acute Care Speech Language Pathologist
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
## 2014-05-26 NOTE — Progress Notes (Signed)
Dr Jerral Ralph notified of completion of esophagus xray. Results pending, images visible, need orders to feed and or give meds.
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
## 2014-05-26 NOTE — Progress Notes (Signed)
PATIENT DETAILS Name: Shannon [name] Age: 64 y.o. Sex: female Date of Birth: 1950/09/29 Admit Date: 05/26/2014 Admitting Physician Lorretta Harp, MD PCP:No PCP Per Patient  Subjective: No chest pain this am. Has solid food dysphagia for the past 3 months.   Assessment/Plan: Principal Problem:   Chest pain:mostly right sided/epigastric-suspect GI etiology.EKG/Troponin negative. Pending Echo. Await GI input and Barium esophagogram.  Active Problems:   Dysphagia: solid food dysphagia for the past 3 months. Await Barium Esophagogram, consulted GI for EGD.    Diarrhea:resolved, doubt any need for work up    Acute Kidney Injury:Resolved.     Hypokalemia:replete and recheck    TDV:VOHYWVPXTG, continue with Amlodipine    ETOH Abuse:counseled,continue with Ativan per [facility]WA protocol.    Dyslipidemia:continue Statin    GERD:continue PPI, has hx of solid food dysphagia. Await GI eval  Disposition: Remain inpatient  Antibiotics:  None   Anti-infectives    None      DVT Prophylaxis: Prophylactic Heparin   Code Status: Full code   Family Communication None at bedside  Procedures:  None  CONSULTS:  GI  Time spent 40 minutes-which includes 50% of the time with face-to-face with patient/ family and coordinating care related to the above assessment and plan.  MEDICATIONS: Scheduled Meds: . amLODipine  10 mg Oral Daily  . aspirin EC  81 mg Oral Daily  . atorvastatin  20 mg Oral q1800  . folic acid  1 mg Oral Daily  . heparin  5,000 Units Subcutaneous 3 times per day  . LORazepam  0-4 mg Intravenous Q6H   Followed by  . [START ON 05/28/2014] LORazepam  0-4 mg Intravenous Q12H  . mometasone-formoterol  2 puff Inhalation BID  . multivitamin with minerals  1 tablet Oral Daily  . pantoprazole  40 mg Oral Q1200  . potassium chloride  10 mEq Intravenous Once  . potassium chloride  40 mEq Oral Once  . sodium chloride  3 mL Intravenous Q12H  . thiamine  100 mg  Oral Daily   Or  . thiamine  100 mg Intravenous Daily   Continuous Infusions: . sodium chloride     PRN Meds:.albuterol, alum & mag hydroxide-simeth, hydrALAZINE, LORazepam **OR** LORazepam, morphine injection, nitroGLYCERIN, ondansetron **OR** ondansetron (ZOFRAN) IV    PHYSICAL EXAM: Vital signs in last 24 hours: Filed Vitals:   05/26/14 0215 05/26/14 0230 05/26/14 0245 05/26/14 0341  BP: 107/63 90/59 102/59 112/56  Pulse: 83 86 88 85  Temp:    97.9 F (36.6 C)  TempSrc:    Oral  Resp: 21 10 15 18   SpO2: 97% 92% 97% 98%    Weight change:  There were no vitals filed for this visit. There is no weight on file to calculate BMI.   Gen Exam: Awake and alert with clear speech.   Neck: Supple, No JVD.   Chest: B/L Clear.   CVS: S1 S2 Regular, no murmurs.  Abdomen: soft, BS +, non tender, non distended.  Extremities: no edema, lower extremities warm to touch. Neurologic: Non Focal.   Skin: No Rash.   Wounds: N/A.    Intake/Output from previous day: No intake or output data in the 24 hours ending 05/26/14 1204   LAB RESULTS: CBC  Recent Labs Lab 05/26/14 0041 05/26/14 0512  WBC 5.8 5.2  HGB 13.8 12.2  HCT 41.5 37.0  PLT 306 263  MCV 90.4 90.9  MCH 30.1 30.0  MCHC 33.3 33.0  RDW 13.9 14.0    Chemistries   Recent Labs Lab 05/26/14 0041 05/26/14 0512  NA 137 142  K 2.8* 3.3*  CL 102 108  CO2 19 22  GLUCOSE 143* 108*  BUN 7 5*  CREATININE 1.20* 0.98  CALCIUM 9.0 8.8  MG  --  1.9    CBG:  Recent Labs Lab 05/26/14 0041  GLUCAP 138*    GFR CrCl cannot be calculated (Unknown ideal weight.).  Coagulation profile  Recent Labs Lab 05/26/14 0512  INR 1.03    Cardiac Enzymes  Recent Labs Lab 05/26/14 0512 05/26/14 1020  TROPONINI <0.03 <0.03    Invalid input(s): POCBNP No results for input(s): DDIMER in the last 72 hours. No results for input(s): HGBA1C in the last 72 hours.  Recent Labs  05/26/14 0512  CHOL 174  HDL 59    LDLCALC 86  TRIG 145  CHOLHDL 2.9   No results for input(s): TSH, T4TOTAL, T3FREE, THYROIDAB in the last 72 hours.  Invalid input(s): FREET3 No results for input(s): VITAMINB12, FOLATE, FERRITIN, TIBC, IRON, RETICCTPCT in the last 72 hours. No results for input(s): LIPASE, AMYLASE in the last 72 hours.  Urine Studies No results for input(s): UHGB, CRYS in the last 72 hours.  Invalid input(s): UACOL, UAPR, USPG, UPH, UTP, UGL, UKET, UBIL, UNIT, UROB, ULEU, UEPI, UWBC, URBC, UBAC, CAST, UCOM, BILUA  MICROBIOLOGY: No results found for this or any previous visit (from the past 240 hour(s)).  RADIOLOGY STUDIES/RESULTS: Dg Chest Port 1 View  05/26/2014   CLINICAL DATA:  Centralized chest pain tonight with labored breathing. History of asthma and hypertension.  EXAM: PORTABLE CHEST - 1 VIEW  COMPARISON:  09/20/2012  FINDINGS: The heart size and mediastinal contours are within normal limits. Both lungs are clear. The visualized skeletal structures are unremarkable.  IMPRESSION: No active disease.   Electronically Signed   By: Burman Nieves M.D.   On: 05/26/2014 00:56    Jeoffrey Massed, MD  [facility] Pager:[phone]  If 7PM-7AM, please contact night-coverage [email or web address] Password [email or web address] 05/26/2014, 12:04 PM   LOS: 0 days

## 2014-05-26 NOTE — ED Provider Notes (Addendum)
CSN: [id number]     Arrival date & time 05/26/14  0032 History   First MD Initiated Contact with Patient 05/26/14 0100     This chart was scribed for Shannon Booze, MD by Arlan Organ, ED Scribe. This patient was seen in room A05C/A05C and the patient's care was started 1:04 AM.    Chief Complaint  Patient presents with  . Chest Pain   HPI  HPI Comments: Shannon [name] is a 64 y.o. female with a PMHx of HTN and hyperlipidemia  who presents to the Emergency Department complaining of constant, sudden onset chest pain that radiates into the R arm onset earlier this evening after consuming alcohol and arguing with her daughter. Pain is described as pressure which lasted approximately 10-15 minutes. Pt also reports SOB and diaphoresis at time of chest pain. She denies any fever, chills, nausea, or vomiting. Shannon [name] admits to consuming approximately 3-4 drinks this evening. No personal or family history of heart disease. No known allergies to medications.  She is followed by a Family Clinic- Unaware of name  Past Medical History  Diagnosis Date  . Hypertension   . Asthma    History reviewed. No pertinent past surgical history. History reviewed. No pertinent family history. History  Substance Use Topics  . Smoking status: Not on file  . Smokeless tobacco: Not on file  . Alcohol Use: Not on file   OB History    No data available     Review of Systems  Constitutional: Positive for diaphoresis. Negative for fever and chills.  Respiratory: Positive for shortness of breath.   Cardiovascular: Positive for chest pain.  All other systems reviewed and are negative.     Allergies  Review of patient's allergies indicates no known allergies.  Home Medications   Prior to Admission medications   Medication Sig Start Date End Date Taking? Authorizing Provider  albuterol (PROVENTIL HFA;VENTOLIN HFA) 108 (90 BASE) MCG/ACT inhaler Inhale 2 puffs into the lungs every 6 (six) hours as  needed for wheezing or shortness of breath.    Historical Provider, MD  albuterol (PROVENTIL HFA;VENTOLIN HFA) 108 (90 BASE) MCG/ACT inhaler Inhale 2 puffs into the lungs every 2 (two) hours as needed for wheezing or shortness of breath (cough). 09/21/12   Ivonne Andrew, PA-C  amLODipine (NORVASC) 10 MG tablet Take 10 mg by mouth daily.    Historical Provider, MD  aspirin EC 81 MG tablet Take 81 mg by mouth daily.    Historical Provider, MD  CALCIUM PO Take 1 tablet by mouth daily.    Historical Provider, MD  Fluticasone-Salmeterol (ADVAIR DISKUS) 250-50 MCG/DOSE AEPB Inhale 1 puff into the lungs 2 (two) times daily. 09/21/12   Ivonne Andrew, PA-C   Triage Vitals: BP 137/74 mmHg  Pulse 95  Temp(Src) 98.1 F (36.7 C) (Oral)  Resp 24  SpO2 100%   Physical Exam  Constitutional: She is oriented to person, place, and time. She appears well-developed and well-nourished. No distress.  HENT:  Head: Normocephalic and atraumatic.  Eyes: EOM are normal. Pupils are equal, round, and reactive to light.  Neck: Normal range of motion. Neck supple. No JVD present.  Cardiovascular: Normal rate, regular rhythm and normal heart sounds.   No murmur heard. Pulmonary/Chest: Effort normal and breath sounds normal. She has no wheezes. She has no rales. She exhibits no tenderness.  Abdominal: Soft. Bowel sounds are normal. She exhibits no distension and no mass. There is no tenderness.  Musculoskeletal: Normal range of  motion. She exhibits no edema.  Lymphadenopathy:    She has no cervical adenopathy.  Neurological: She is alert and oriented to person, place, and time. No cranial nerve deficit. Coordination normal.  Slow to answer questions  Skin: Skin is warm and dry. No rash noted.  Psychiatric: She has a normal mood and affect. Judgment normal.  Nursing note and vitals reviewed.   ED Course  Procedures (including critical care time)  DIAGNOSTIC STUDIES: Oxygen Saturation is 100% on RA, Normal by my  interpretation.    COORDINATION OF CARE: 1:01 AM-Discussed treatment plan with pt at bedside and pt agreed to plan.     Labs Review Results for orders placed or performed during the hospital encounter of 05/26/14  CBC  Result Value Ref Range   WBC 5.8 4.0 - 10.5 K/uL   RBC 4.59 3.87 - 5.11 MIL/uL   Hemoglobin 13.8 12.0 - 15.0 g/dL   HCT 16.1 09.6 - 04.5 %   MCV 90.4 78.0 - 100.0 fL   MCH 30.1 26.0 - 34.0 pg   MCHC 33.3 30.0 - 36.0 g/dL   RDW 40.9 81.1 - 91.4 %   Platelets 306 150 - 400 K/uL  Basic metabolic panel  Result Value Ref Range   Sodium 137 135 - 145 mmol/L   Potassium 2.8 (L) 3.5 - 5.1 mmol/L   Chloride 102 96 - 112 mmol/L   CO2 19 19 - 32 mmol/L   Glucose, Bld 143 (H) 70 - 99 mg/dL   BUN 7 6 - 23 mg/dL   Creatinine, Ser 7.82 (H) 0.50 - 1.10 mg/dL   Calcium 9.0 8.4 - 95.6 mg/dL   GFR calc non Af Amer 46 (L) >90 mL/min   GFR calc Af Amer 54 (L) >90 mL/min   Anion gap 16 (H) 5 - 15  Ethanol  Result Value Ref Range   Alcohol, Ethyl (B) 231 (H) 0 - 9 mg/dL  I-stat troponin, ED (not at [facility])  Result Value Ref Range   Troponin i, poc 0.00 0.00 - 0.08 ng/mL   Comment 3          CBG monitoring, ED  Result Value Ref Range   Glucose-Capillary 138 (H) 70 - 99 mg/dL   Imaging Review Dg Chest Port 1 View  05/26/2014   CLINICAL DATA:  Centralized chest pain tonight with labored breathing. History of asthma and hypertension.  EXAM: PORTABLE CHEST - 1 VIEW  COMPARISON:  09/20/2012  FINDINGS: The heart size and mediastinal contours are within normal limits. Both lungs are clear. The visualized skeletal structures are unremarkable.  IMPRESSION: No active disease.   Electronically Signed   By: Burman Nieves M.D.   On: 05/26/2014 00:56     EKG Interpretation   Date/Time:  Thursday May 26 2014 00:36:20 EDT Ventricular Rate:  103 PR Interval:  149 QRS Duration: 88 QT Interval:  374 QTC Calculation: 490 R Axis:   61 Text Interpretation:  Sinus tachycardia Nonspecific  repol abnormality,  diffuse leads When compared with ECG of 03/16/2008, No significant change  was found Confirmed by [facility]  MD, Arianis Bowditch ([id number]) on 05/26/2014 12:45:24 AM      MDM   Final diagnoses:  Chest pain, unspecified chest pain type  Alcohol intoxication, uncomplicated    Chest pain which is worrisome for cardiac etiology. She does have risk factors of hypertension and hyperlipidemia. She is also intoxicated. ECG shows no acute changes and troponin is normal. Hypokalemia is noted and she is given  both oral and intravenous potassium. Case is discussed with Dr. Clyde Lundborg of [facility] agrees to admit the patient under observation status.  I personally performed the services described in this documentation, which was scribed in my presence. The recorded information has been reviewed and is accurate.      Shannon Booze, MD 05/26/14 1610  Shannon Booze, MD 05/26/14 [phone]

## 2014-05-26 NOTE — Consult Note (Signed)
Reason for Consult: Dysphagia and diarrhea Referring Physician: [facility]  Elmer Ramp HPI: This is a 64 year old female with a PMH of HTN, GERD, asthma, hyperlipidemia, and ETOH abuse who is admitted with chest pain and diarrhea.  Her symptoms of chest pain started when she was having an argument with her daughter and this was associated with ETOH.  The chest pain appeared to be cardiac as it was substernal with a pressure sensation, but she was ruled out for an MI.  Her chest pain also resolved, but with the resolution of her chest pain she reports issues with dysphagia.  This has been an ongoing issue for the past 5 months.  The dysphagia is to both solids and liquids, but it is mostly with solids.  At times she has had to vomit the food bolus.  She has a history of GERD, but no problems with nausea or weight loss with her current symptoms.  An esophagram was performed and it is positive for a small sliding hiatal hernia and a nonobstructive Schatzki's ring.  The ring did not prevent the passage of the 13 mm tablet.  As for her diarrhea, it was acute and it has now resolved.  Past Medical History  Diagnosis Date  . Hypertension   . Asthma   . GERD (gastroesophageal reflux disease)   . HLD (hyperlipidemia)     Past Surgical History  Procedure Laterality Date  . Knee surgery      Right knee patellar surgery    Family History  Problem Relation Age of Onset  . Hypertension Mother   . Hypertension Father   . Hypertension Sister     Social History:  reports that she has never smoked. She does not have any smokeless tobacco history on file. She reports that she drinks alcohol. She reports that she does not use illicit drugs.  Allergies: No Known Allergies  Medications:  Scheduled: . amLODipine  10 mg Oral Daily  . aspirin EC  81 mg Oral Daily  . atorvastatin  20 mg Oral q1800  . folic acid  1 mg Oral Daily  . heparin  5,000 Units Subcutaneous 3 times per day  .  LORazepam  0-4 mg Intravenous Q6H   Followed by  . [START ON 05/28/2014] LORazepam  0-4 mg Intravenous Q12H  . mometasone-formoterol  2 puff Inhalation BID  . multivitamin with minerals  1 tablet Oral Daily  . pantoprazole  40 mg Oral Q1200  . potassium chloride  10 mEq Intravenous Once  . potassium chloride  40 mEq Oral Once  . sodium chloride  3 mL Intravenous Q12H  . thiamine  100 mg Oral Daily   Or  . thiamine  100 mg Intravenous Daily   Continuous: . sodium chloride      Results for orders placed or performed during the hospital encounter of 05/26/14 (from the past 24 hour(s))  Ethanol     Status: Abnormal   Collection Time: 05/26/14 12:40 AM  Result Value Ref Range   Alcohol, Ethyl (B) 231 (H) 0 - 9 mg/dL  CBC     Status: None   Collection Time: 05/26/14 12:41 AM  Result Value Ref Range   WBC 5.8 4.0 - 10.5 K/uL   RBC 4.59 3.87 - 5.11 MIL/uL   Hemoglobin 13.8 12.0 - 15.0 g/dL   HCT 16.1 09.6 - 04.5 %   MCV 90.4 78.0 - 100.0 fL   MCH 30.1 26.0 - 34.0 pg  MCHC 33.3 30.0 - 36.0 g/dL   RDW 14.7 82.9 - 56.2 %   Platelets 306 150 - 400 K/uL  Basic metabolic panel     Status: Abnormal   Collection Time: 05/26/14 12:41 AM  Result Value Ref Range   Sodium 137 135 - 145 mmol/L   Potassium 2.8 (L) 3.5 - 5.1 mmol/L   Chloride 102 96 - 112 mmol/L   CO2 19 19 - 32 mmol/L   Glucose, Bld 143 (H) 70 - 99 mg/dL   BUN 7 6 - 23 mg/dL   Creatinine, Ser 1.30 (H) 0.50 - 1.10 mg/dL   Calcium 9.0 8.4 - 86.5 mg/dL   GFR calc non Af Amer 46 (L) >90 mL/min   GFR calc Af Amer 54 (L) >90 mL/min   Anion gap 16 (H) 5 - 15  CBG monitoring, ED     Status: Abnormal   Collection Time: 05/26/14 12:41 AM  Result Value Ref Range   Glucose-Capillary 138 (H) 70 - 99 mg/dL  I-stat troponin, ED (not at [facility])     Status: None   Collection Time: 05/26/14 12:43 AM  Result Value Ref Range   Troponin i, poc 0.00 0.00 - 0.08 ng/mL   Comment 3          Troponin I (q 6hr x 3)     Status: None   Collection  Time: 05/26/14  5:12 AM  Result Value Ref Range   Troponin I <0.03 <0.031 ng/mL  Lipid panel     Status: None   Collection Time: 05/26/14  5:12 AM  Result Value Ref Range   Cholesterol 174 0 - 200 mg/dL   Triglycerides 784 <696 mg/dL   HDL 59 >29 mg/dL   Total CHOL/HDL Ratio 2.9 RATIO   VLDL 29 0 - 40 mg/dL   LDL Cholesterol 86 0 - 99 mg/dL  Magnesium     Status: None   Collection Time: 05/26/14  5:12 AM  Result Value Ref Range   Magnesium 1.9 1.5 - 2.5 mg/dL  Protime-INR     Status: None   Collection Time: 05/26/14  5:12 AM  Result Value Ref Range   Prothrombin Time 13.7 11.6 - 15.2 seconds   INR 1.03 0.00 - 1.49  Comprehensive metabolic panel     Status: Abnormal   Collection Time: 05/26/14  5:12 AM  Result Value Ref Range   Sodium 142 135 - 145 mmol/L   Potassium 3.3 (L) 3.5 - 5.1 mmol/L   Chloride 108 96 - 112 mmol/L   CO2 22 19 - 32 mmol/L   Glucose, Bld 108 (H) 70 - 99 mg/dL   BUN 5 (L) 6 - 23 mg/dL   Creatinine, Ser 5.28 0.50 - 1.10 mg/dL   Calcium 8.8 8.4 - 41.3 mg/dL   Total Protein 6.6 6.0 - 8.3 g/dL   Albumin 3.5 3.5 - 5.2 g/dL   AST 21 0 - 37 U/L   ALT 18 0 - 35 U/L   Alkaline Phosphatase 94 39 - 117 U/L   Total Bilirubin 0.6 0.3 - 1.2 mg/dL   GFR calc non Af Amer 59 (L) >90 mL/min   GFR calc Af Amer 69 (L) >90 mL/min   Anion gap 12 5 - 15  CBC     Status: None   Collection Time: 05/26/14  5:12 AM  Result Value Ref Range   WBC 5.2 4.0 - 10.5 K/uL   RBC 4.07 3.87 - 5.11 MIL/uL   Hemoglobin 12.2  12.0 - 15.0 g/dL   HCT 82.4 23.5 - 36.1 %   MCV 90.9 78.0 - 100.0 fL   MCH 30.0 26.0 - 34.0 pg   MCHC 33.0 30.0 - 36.0 g/dL   RDW 44.3 15.4 - 00.8 %   Platelets 263 150 - 400 K/uL  Troponin I (q 6hr x 3)     Status: None   Collection Time: 05/26/14 10:20 AM  Result Value Ref Range   Troponin I <0.03 <0.031 ng/mL  Urine rapid drug screen (hosp performed)     Status: None   Collection Time: 05/26/14  1:33 PM  Result Value Ref Range   Opiates NONE DETECTED  NONE DETECTED   Cocaine NONE DETECTED NONE DETECTED   Benzodiazepines NONE DETECTED NONE DETECTED   Amphetamines NONE DETECTED NONE DETECTED   Tetrahydrocannabinol NONE DETECTED NONE DETECTED   Barbiturates NONE DETECTED NONE DETECTED  Creatinine, urine, random     Status: None   Collection Time: 05/26/14  1:44 PM  Result Value Ref Range   Creatinine, Urine 98.86 mg/dL     Dg Esophagus  07/25/6193   CLINICAL DATA:  Dysphagia.  Food sticking in chest.  EXAM: ESOPHOGRAM / BARIUM SWALLOW / BARIUM TABLET STUDY  TECHNIQUE: Combined double contrast and single contrast examination performed using effervescent crystals, thick barium liquid, and thin barium liquid. The patient was observed with fluoroscopy swallowing a 56mm barium sulphate tablet.  FLUOROSCOPY TIME:  Fluoroscopy Time:  2 minutes 0 seconds  Number of Acquired Images:  10  COMPARISON:  None.  FINDINGS: No evidence of esophageal mass. A tiny sliding hiatal hernia is seen. A mild Schatzki ring is seen at the gastroesophageal junction, however this does not prevent passage of a 13 mm barium tablet.  Mild gastroesophageal reflux was seen to the level of the mid thoracic esophagus during coughing. Esophageal motility is within normal limits.  IMPRESSION: Tiny sliding hiatal hernia and mild gastroesophageal reflux.  Mild Schatzki ring at the GE junction, however this does not prevent passage of a 13 mm barium tablet.   Electronically Signed   By: Myles Rosenthal M.D.   On: 05/26/2014 13:24   Dg Chest Port 1 View  05/26/2014   CLINICAL DATA:  Centralized chest pain tonight with labored breathing. History of asthma and hypertension.  EXAM: PORTABLE CHEST - 1 VIEW  COMPARISON:  09/20/2012  FINDINGS: The heart size and mediastinal contours are within normal limits. Both lungs are clear. The visualized skeletal structures are unremarkable.  IMPRESSION: No active disease.   Electronically Signed   By: Burman Nieves M.D.   On: 05/26/2014 00:56    ROS:  As  stated above in the HPI otherwise negative.  Blood pressure 127/57, pulse 93, temperature 98 F (36.7 C), temperature source Oral, resp. rate 20, SpO2 97 %.    PE: Gen: NAD, Alert and Oriented HEENT:  [place]/AT, EOMI Neck: Supple, no LAD Lungs: CTA Bilaterally CV: RRR without M/G/R ABM: Soft, NTND, +BS Ext: No C/C/E  Assessment/Plan: 1) Dysphagia. 2) Schatzki's ring. 3) Noncardiac chest pain. 4) Diarrhea.   With her symptoms I think she will benefit with an EGD and possible dilation.    Plan: 1) EGD. 2) Continue with Protonix.  Abiha Lukehart D 05/26/2014, 2:25 PM

## 2014-05-26 NOTE — Evaluation (Signed)
Occupational Therapy Evaluation and Discharge Patient Details Name: Shannon [name] MRN: [id number] DOB: [birth year] Today's Date: 05/26/2014    History of Present Illness Pt is a 64 y.o. Female with PMH of HTN, HLD, GERD, asthma, dysphagia, alcohol abuse admitted 05/26/14 with chest pain and diarrhea. In ED, pt's chest pain resolved. Pt's dysphagia has been occuring for several months and she has not yet seen a GI doctor. In ED, pt was found to have negative troponins, potassium 2.8, AKI, no tachy; EKG showed Q-wave only aVL, nonspecific T-wave changes in precordial leads.   Clinical Impression   PTA pt was independent with ADLs and IADLs. Pt is at Mod I level and reports chest pain has resolved. Pt is eager for results of barium esophagram today to further determine cause of dysphagia. No further acute OT needs.     Follow Up Recommendations  No OT follow up    Equipment Recommendations  None recommended by OT    Recommendations for Other Services       Precautions / Restrictions Precautions Precautions: Fall Restrictions Weight Bearing Restrictions: No      Mobility Bed Mobility               General bed mobility comments: Pt sitting in recliner when OT arrived.   Transfers Overall transfer level: Modified independent Equipment used: None                  Balance Overall balance assessment: No apparent balance deficits (not formally assessed)                                          ADL Overall ADL's : Modified independent                                       General ADL Comments: Mod I for ADLs. Pt is eager from results of barium esophagram.      Vision Additional Comments: Pt reports no change from baseline.           Pertinent Vitals/Pain Pain Assessment: No/denies pain        Extremity/Trunk Assessment Upper Extremity Assessment Upper Extremity Assessment: Overall WFL for tasks assessed   Lower  Extremity Assessment Lower Extremity Assessment: Overall WFL for tasks assessed   Cervical / Trunk Assessment Cervical / Trunk Assessment: Normal   Communication Communication Communication: No difficulties   Cognition Arousal/Alertness: Awake/alert Behavior During Therapy: WFL for tasks assessed/performed Overall Cognitive Status: Within Functional Limits for tasks assessed                                Home Living Family/patient expects to be discharged to:: Private residence Living Arrangements: Other (Comment) (pt did not state but reports she does not live alone) Available Help at Discharge: Family;Available 24 hours/day Type of Home: Apartment Home Access: Stairs to enter Entrance Stairs-Number of Steps: 6 steps down to her apartment door   Home Layout: One level     Bathroom Shower/Tub: Tub/shower unit Shower/tub characteristics: Engineer, building services: Standard     Home Equipment: None          Prior Functioning/Environment Level of Independence: Independent        Comments: Does not work, continues  to drive. Is able to do all IADL's independently.     OT Diagnosis: Generalized weakness;Acute pain    End of Session  Activity Tolerance: Patient tolerated treatment well Patient left: in chair;with call bell/phone within reach   Time: 1610-9604 OT Time Calculation (min): 10 min Charges:  OT General Charges $OT Visit: 1 Procedure OT Evaluation $Initial OT Evaluation Tier I: 1 Procedure G-Codes:    Rae Lips 06-10-2014, 1:45 PM  Carney Living, OTR/L Occupational Therapist [phone] (pager)

## 2014-05-27 ENCOUNTER — Inpatient Hospital Stay (HOSPITAL_COMMUNITY): Payer: Medicare HMO | Admitting: Critical Care Medicine

## 2014-05-27 ENCOUNTER — Encounter (HOSPITAL_COMMUNITY): Payer: Self-pay | Admitting: Critical Care Medicine

## 2014-05-27 ENCOUNTER — Encounter (HOSPITAL_COMMUNITY)
Admission: EM | Disposition: A | Discharge status: Home / Self Care | Payer: Self-pay | Source: Home / Self Care | Attending: Internal Medicine

## 2014-05-27 DIAGNOSIS — R079 Chest pain, unspecified: Secondary | ICD-10-CM

## 2014-05-27 DIAGNOSIS — K219 Gastro-esophageal reflux disease without esophagitis: Secondary | ICD-10-CM

## 2014-05-27 DIAGNOSIS — K222 Esophageal obstruction: Secondary | ICD-10-CM | POA: Diagnosis not present

## 2014-05-27 HISTORY — PX: ESOPHAGOGASTRODUODENOSCOPY: SHX5428

## 2014-05-27 LAB — HEMOGLOBIN A1C
Hgb A1c MFr Bld: 6.1 % — ABNORMAL HIGH (ref 4.8–5.6)
Mean Plasma Glucose: 128 mg/dL

## 2014-05-27 LAB — GLUCOSE, CAPILLARY: Glucose-Capillary: 110 mg/dL — ABNORMAL HIGH (ref 70–99)

## 2014-05-27 SURGERY — EGD (ESOPHAGOGASTRODUODENOSCOPY)
Anesthesia: Monitor Anesthesia Care

## 2014-05-27 MED ORDER — SODIUM CHLORIDE 0.9 % IV SOLN: INTRAVENOUS | Status: DC

## 2014-05-27 MED ORDER — PANTOPRAZOLE SODIUM 40 MG PO TBEC: 40.0000 mg | DELAYED_RELEASE_TABLET | Freq: Every day | ORAL | Status: DC

## 2014-05-27 MED ORDER — LACTATED RINGERS IV SOLN
INTRAVENOUS | Status: DC
  Administered 2014-05-27: 1000 mL via INTRAVENOUS

## 2014-05-27 MED ORDER — BUTAMBEN-TETRACAINE-BENZOCAINE 2-2-14 % EX AERO
INHALATION_SPRAY | CUTANEOUS | Status: DC | PRN
  Administered 2014-05-27: 2 via TOPICAL

## 2014-05-27 MED ORDER — PROPOFOL INFUSION 10 MG/ML OPTIME
INTRAVENOUS | Status: DC | PRN
  Administered 2014-05-27: 100 ug/kg/min via INTRAVENOUS

## 2014-05-27 NOTE — Anesthesia Procedure Notes (Signed)
Procedure Name: MAC Date/Time: 05/27/2014 8:30 AM Performed by: Glo Herring B Pre-anesthesia Checklist: Patient identified, Emergency Drugs available, Suction available, Patient being monitored and Timeout performed Patient Re-evaluated:Patient Re-evaluated prior to inductionOxygen Delivery Method: Nasal cannula Intubation Type: IV induction Placement Confirmation: positive ETCO2 Dental Injury: Teeth and Oropharynx as per pre-operative assessment

## 2014-05-27 NOTE — Anesthesia Postprocedure Evaluation (Signed)
  Anesthesia Post-op Note  Patient: Shannon [name]  Procedure(s) Performed: Procedure(s): ESOPHAGOGASTRODUODENOSCOPY (EGD) (N/A)  Patient Location: Endoscopy Unit  Anesthesia Type:MAC  Level of Consciousness: awake and alert   Airway and Oxygen Therapy: Patient Spontanous Breathing  Post-op Pain: none  Post-op Assessment: Post-op Vital signs reviewed, Patient's Cardiovascular Status Stable, Respiratory Function Stable, Patent Airway, No signs of Nausea or vomiting and Pain level controlled  Post-op Vital Signs: Reviewed and stable  Last Vitals:  Filed Vitals:   05/27/14 1042  BP: 115/56  Pulse: 100  Temp:   Resp:     Complications: No apparent anesthesia complications

## 2014-05-27 NOTE — Op Note (Signed)
[facility] [address] [place], [address]   ENDOSCOPY PROCEDURE REPORT  PATIENT: Shannon [name], Shannon [name]  MR#: [id number] BIRTHDATE: 12-12-1950 , 64  yrs. old GENDER: female ENDOSCOPIST:Zahirah Cheslock Elnoria Howard, MD REFERRED BY: PROCEDURE DATE:  30-May-2014 PROCEDURE:   EGD w/ balloon dilation ASA CLASS:    Class III INDICATIONS: dysphagia. MEDICATION: Monitored anesthesia care TOPICAL ANESTHETIC:   Cetacaine Spray  DESCRIPTION OF PROCEDURE:   After the risks and benefits of the procedure were explained, informed consent was obtained.  The [employer] GASTOROSCOPE [id number]  endoscope was introduced through the mouth  and advanced to the second portion of the duodenum .  The instrument was slowly withdrawn as the mucosa was fully examined. Estimated blood loss is zero unless otherwise noted in this procedure report.   FINDINGS: In the distal esophagus there was a peptic stricture and evidence of an LA Grade A esophagitis.  A 3-4 cm hiatal hernia was also identified.  The stricture was balloon dilated at 16.5 mm. The balloon moved freely, but with deflation of the balloon two small mucosal tears were achieved.  The gastric lumen exhibited some mild antral erythema.  The duodenum was normal.    Retroflexed views revealed no abnormalities.    The scope was then withdrawn from the patient and the procedure completed.  COMPLICATIONS: There were no immediate complications.  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 2) LA Grade A esophagitis. 3) 3-4 cm hiatal hernia. 4) Mild antral eyrthema.  RECOMMENDATIONS: 1) PPI QD 30 minutes before breakfast. 2) Follow up in the office in 2-4 weeks.   _______________________________ eSignedJeani Hawking, MD May 30, 2014 8:49 AM     cc:  CPT CODES: ICD CODES:  The ICD and CPT codes recommended by this software are interpretations from the data that the clinical staff has captured with the  software.  The verification of the translation of this report to the ICD and CPT codes and modifiers is the sole responsibility of the health care institution and practicing physician where this report was generated.  [employer] will not be held responsible for the validity of the ICD and CPT codes included on this report.  AMA assumes no liability for data contained or not contained herein. CPT is a Publishing rights manager of the [employer].  PATIENT NAME:  Shannon [name], Shannon [name] MR#: [id number]

## 2014-05-27 NOTE — Progress Notes (Signed)
  Echocardiogram 2D Echocardiogram has been performed.  Delcie Roch 05/27/2014, 11:18 AM

## 2014-05-27 NOTE — Progress Notes (Signed)
Utilization review completed.  

## 2014-05-27 NOTE — Progress Notes (Signed)
Patient discharged to home. Discharge instructions and prescriptions given, patient verbalized understanding. Patient taken out to private vehicle via wheelchair. afleming RN

## 2014-05-27 NOTE — Transfer of Care (Signed)
Immediate Anesthesia Transfer of Care Note  Patient: Shannon [name]  Procedure(s) Performed: Procedure(s): ESOPHAGOGASTRODUODENOSCOPY (EGD) (N/A)  Patient Location: Endoscopy Unit  Anesthesia Type:MAC  Level of Consciousness: awake, alert  and oriented  Airway & Oxygen Therapy: Patient Spontanous Breathing and Patient connected to nasal cannula oxygen  Post-op Assessment: Report given to RN, Post -op Vital signs reviewed and stable and Patient moving all extremities X 4  Post vital signs: Reviewed and stable  Last Vitals:  Filed Vitals:   05/27/14 0734  BP: 144/75  Pulse:   Temp: 37.2 C  Resp: 15    Complications: No apparent anesthesia complications

## 2014-05-27 NOTE — Progress Notes (Signed)
Patient returned to room from Endoscopy. Patient A/O, pain 0/10 at this time. POC dicussed with the patient and her daughter. Patient resting comfortably at this time. afleming RN

## 2014-05-27 NOTE — Discharge Summary (Signed)
PATIENT DETAILS Name: Shannon [name] Age: 64 y.o. Sex: female Date of Birth: June 03, 1950 MRN: [id number]. Admitting Physician: Lorretta Harp, MD [id number]  Admit Date: 05/26/2014 Discharge date: 05/27/2014  Recommendations for Outpatient Follow-up:  1. Please ensure follow-up with gastroenterology-Dr. Elnoria Howard 2. Please continue counseling regarding avoidance of further alcohol use 3. Consider referral to cardiology for outpatient nuclear stress test  PRIMARY DISCHARGE DIAGNOSIS:  Principal Problem:   Chest pain Active Problems:   HYPERCHOLESTEROLEMIA   HYPERTENSION, BENIGN SYSTEMIC   Allergic rhinitis   G E R D   Dysphagia   AKI (acute kidney injury)   Diarrhea   Alcohol intoxication   Hypokalemia      PAST MEDICAL HISTORY: Past Medical History  Diagnosis Date  . Hypertension   . Asthma   . GERD (gastroesophageal reflux disease)   . HLD (hyperlipidemia)   . Arthritis     "right knee" (05/26/2014)  . Chronic lower back pain     DISCHARGE MEDICATIONS: Current Discharge Medication List    START taking these medications   Details  pantoprazole (PROTONIX) 40 MG tablet Take 1 tablet (40 mg total) by mouth daily. Take 30 minutes before breakfast Qty: 30 tablet, Refills: 0      CONTINUE these medications which have NOT CHANGED   Details  albuterol (PROVENTIL HFA;VENTOLIN HFA) 108 (90 BASE) MCG/ACT inhaler Inhale 2 puffs into the lungs every 2 (two) hours as needed for wheezing or shortness of breath (cough). Qty: 1 Inhaler, Refills: 0    amLODipine (NORVASC) 10 MG tablet Take 10 mg by mouth daily.    aspirin EC 81 MG tablet Take 81 mg by mouth daily.    CALCIUM PO Take 1 tablet by mouth daily.    Fluticasone-Salmeterol (ADVAIR DISKUS) 250-50 MCG/DOSE AEPB Inhale 1 puff into the lungs 2 (two) times daily. Qty: 60 each, Refills: 0    lovastatin (ALTOPREV) 40 MG 24 hr tablet Take 80 mg by mouth at bedtime.    metoprolol tartrate (LOPRESSOR)  25 MG tablet Take 25 mg by mouth 2 (two) times daily.        ALLERGIES:  No Known Allergies  BRIEF HPI:  See H&P, Labs, Consult and Test reports for all details in brief, patient was admitted for evaluation of chest pain and diarrhea.  CONSULTATIONS:   GI  PERTINENT RADIOLOGIC STUDIES: Dg Esophagus  05/26/2014   CLINICAL DATA:  Dysphagia.  Food sticking in chest.  EXAM: ESOPHOGRAM / BARIUM SWALLOW / BARIUM TABLET STUDY  TECHNIQUE: Combined double contrast and single contrast examination performed using effervescent crystals, thick barium liquid, and thin barium liquid. The patient was observed with fluoroscopy swallowing a 13mm barium sulphate tablet.  FLUOROSCOPY TIME:  Fluoroscopy Time:  2 minutes 0 seconds  Number of Acquired Images:  10  COMPARISON:  None.  FINDINGS: No evidence of esophageal mass. A tiny sliding hiatal hernia is seen. A mild Schatzki ring is seen at the gastroesophageal junction, however this does not prevent passage of a 13 mm barium tablet.  Mild gastroesophageal reflux was seen to the level of the mid thoracic esophagus during coughing. Esophageal motility is within normal limits.  IMPRESSION: Tiny sliding hiatal hernia and mild gastroesophageal reflux.  Mild Schatzki ring at the GE junction, however this does not prevent passage of a 13 mm barium tablet.   Electronically Signed   By: Myles Rosenthal M.D.   On: 05/26/2014 13:24   Dg Chest Port 1 View  05/26/2014  CLINICAL DATA:  Centralized chest pain tonight with labored breathing. History of asthma and hypertension.  EXAM: PORTABLE CHEST - 1 VIEW  COMPARISON:  09/20/2012  FINDINGS: The heart size and mediastinal contours are within normal limits. Both lungs are clear. The visualized skeletal structures are unremarkable.  IMPRESSION: No active disease.   Electronically Signed   By: Burman Nieves M.D.   On: 05/26/2014 00:56     PERTINENT LAB RESULTS: CBC:  Recent Labs  05/26/14 0041 05/26/14 0512  WBC 5.8 5.2    HGB 13.8 12.2  HCT 41.5 37.0  PLT 306 263   CMET CMP     Component Value Date/Time   NA 142 05/26/2014 0512   K 3.3* 05/26/2014 0512   CL 108 05/26/2014 0512   CO2 22 05/26/2014 0512   GLUCOSE 108* 05/26/2014 0512   BUN 5* 05/26/2014 0512   CREATININE 0.98 05/26/2014 0512   CALCIUM 8.8 05/26/2014 0512   PROT 6.6 05/26/2014 0512   ALBUMIN 3.5 05/26/2014 0512   AST 21 05/26/2014 0512   ALT 18 05/26/2014 0512   ALKPHOS 94 05/26/2014 0512   BILITOT 0.6 05/26/2014 0512   GFRNONAA 59* 05/26/2014 0512   GFRAA 69* 05/26/2014 0512    GFR Estimated Creatinine Clearance: 55.7 mL/min (by C-G formula based on Cr of 0.98). No results for input(s): LIPASE, AMYLASE in the last 72 hours.  Recent Labs  05/26/14 0512 05/26/14 1020 05/26/14 1539  TROPONINI <0.03 <0.03 <0.03   Invalid input(s): POCBNP No results for input(s): DDIMER in the last 72 hours.  Recent Labs  05/26/14 0500  HGBA1C 6.1*    Recent Labs  05/26/14 0512  CHOL 174  HDL 59  LDLCALC 86  TRIG 145  CHOLHDL 2.9   No results for input(s): TSH, T4TOTAL, T3FREE, THYROIDAB in the last 72 hours.  Invalid input(s): FREET3 No results for input(s): VITAMINB12, FOLATE, FERRITIN, TIBC, IRON, RETICCTPCT in the last 72 hours. Coags:  Recent Labs  05/26/14 0512  INR 1.03   Microbiology: No results found for this or any previous visit (from the past 240 hour(s)).   BRIEF HOSPITAL COURSE:   Principal Problem: Chest pain:mostly right sided/epigastric-suspect GI etiology.EKG/Troponin negative.  Echo showed preserved ejection fraction without any wall motion abnormalities. Gastroenterology was consulted, patient underwent a barium esophagogram which showed a Schatzki ring. Subsequently underwent EGD on 4/8 which showed a peptic stricture which was dilated. Patient had lunch following EGD, did not have any further dysphagia. Recommendations are to keep on a PPI and have patient follow up with Dr. Elnoria Howard as an  outpatient. Will defer to PCP for outpatient cardiology referral and also both stress testing. However pain very atypical in nature.  Active Problems: Dysphagia: Patient developed solid food dysphagia for the past 3 months. Please see above.  Diarrhea: Resolved without need any further workup.   Hypokalemia:repleted   INO:MVEHMCNOBS, continue with Amlodipine   ETOH Abuse:counseled, no signs of alcohol withdrawal this admission, was managed with Ativan per CIWA protocol.   Dyslipidemia:continue Statin   GERD:continue PPI  TODAY-DAY OF DISCHARGE:  Subjective:   Surya Beine today has no headache,no chest abdominal pain,no new weakness tingling or numbness, feels much better wants to go home today.   Objective:   Blood pressure 115/56, pulse 100, temperature 98 F (36.7 C), temperature source Oral, resp. rate 13, height 5\' 7"  (1.702 m), weight 70.308 kg (155 lb), SpO2 99 %.  Intake/Output Summary (Last 24 hours) at 05/27/14 1403 Last data filed at  05/27/14 0845  Gross per 24 hour  Intake    540 ml  Output    200 ml  Net    340 ml   Filed Weights   05/27/14 0734  Weight: 70.308 kg (155 lb)    Exam Awake Alert, Oriented *3, No new F.N deficits, Normal affect [place].AT,PERRAL Supple Neck,No JVD, No cervical lymphadenopathy appriciated.  Symmetrical Chest wall movement, Good air movement bilaterally, CTAB RRR,No Gallops,Rubs or new Murmurs, No Parasternal Heave +ve B.Sounds, Abd Soft, Non tender, No organomegaly appriciated, No rebound -guarding or rigidity. No Cyanosis, Clubbing or edema, No new Rash or bruise  DISCHARGE CONDITION: Stable  DISPOSITION: Home  DISCHARGE INSTRUCTIONS:    Activity:  As tolerated   Diet recommendation: Heart Healthy diet  Discharge Instructions    Call MD for:  severe uncontrolled pain    Complete by:  As directed      Diet - low sodium heart healthy    Complete by:  As directed      Increase activity slowly    Complete by:   As directed            Follow-up Information    Follow up with [facility]. Schedule an appointment as soon as possible for a visit in 1 week.   Contact information:   [address] [place] [address] [phone]       Follow up with HUNG,PATRICK D, MD. Schedule an appointment as soon as possible for a visit in 2 weeks.   Specialty:  Gastroenterology   Contact information:   [address] [place] [address] [phone]       Total Time spent on discharge equals 25 minutes.  SignedJeoffrey Massed 05/27/2014 2:03 PM

## 2014-05-27 NOTE — Anesthesia Preprocedure Evaluation (Signed)
Anesthesia Evaluation  Patient identified by MRN, date of birth, ID band Patient awake    Reviewed: Allergy & Precautions, NPO status , Patient's Chart, lab work & pertinent test results  Airway        Dental  (+) Dental Advisory Given   Pulmonary asthma , former smoker,          Cardiovascular hypertension, Pt. on medications     Neuro/Psych    GI/Hepatic GERD-  Medicated,  Endo/Other    Renal/GU      Musculoskeletal  (+) Arthritis -,   Abdominal   Peds  Hematology   Anesthesia Other Findings   Reproductive/Obstetrics                             Anesthesia Physical Anesthesia Plan  ASA: II  Anesthesia Plan: MAC   Post-op Pain Management:    Induction: Intravenous  Airway Management Planned: Nasal Cannula  Additional Equipment:   Intra-op Plan:   Post-operative Plan:   Informed Consent: I have reviewed the patients History and Physical, chart, labs and discussed the procedure including the risks, benefits and alternatives for the proposed anesthesia with the patient or authorized representative who has indicated his/her understanding and acceptance.   Dental advisory given  Plan Discussed with: Anesthesiologist and Surgeon  Anesthesia Plan Comments:         Anesthesia Quick Evaluation

## 2014-05-30 ENCOUNTER — Encounter (HOSPITAL_COMMUNITY): Payer: Self-pay | Admitting: Gastroenterology

## 2014-06-24 ENCOUNTER — Other Ambulatory Visit: Payer: Self-pay | Admitting: Internal Medicine

## 2014-12-25 ENCOUNTER — Encounter (HOSPITAL_COMMUNITY): Payer: Self-pay | Admitting: *Deleted

## 2014-12-25 ENCOUNTER — Emergency Department (HOSPITAL_COMMUNITY): Payer: Medicare HMO

## 2014-12-25 ENCOUNTER — Emergency Department (HOSPITAL_COMMUNITY)
Admission: EM | Admit: 2014-12-25 | Discharge: 2014-12-25 | Disposition: A | Discharge status: Home / Self Care | Payer: Medicare HMO | Attending: Emergency Medicine | Admitting: Emergency Medicine

## 2014-12-25 DIAGNOSIS — Z7951 Long term (current) use of inhaled steroids: Secondary | ICD-10-CM | POA: Diagnosis not present

## 2014-12-25 DIAGNOSIS — J45901 Unspecified asthma with (acute) exacerbation: Secondary | ICD-10-CM | POA: Diagnosis not present

## 2014-12-25 DIAGNOSIS — G8929 Other chronic pain: Secondary | ICD-10-CM | POA: Diagnosis not present

## 2014-12-25 DIAGNOSIS — Z79891 Long term (current) use of opiate analgesic: Secondary | ICD-10-CM | POA: Diagnosis not present

## 2014-12-25 DIAGNOSIS — Z79899 Other long term (current) drug therapy: Secondary | ICD-10-CM | POA: Diagnosis not present

## 2014-12-25 DIAGNOSIS — M1990 Unspecified osteoarthritis, unspecified site: Secondary | ICD-10-CM | POA: Insufficient documentation

## 2014-12-25 DIAGNOSIS — Z7982 Long term (current) use of aspirin: Secondary | ICD-10-CM | POA: Insufficient documentation

## 2014-12-25 DIAGNOSIS — E785 Hyperlipidemia, unspecified: Secondary | ICD-10-CM | POA: Diagnosis not present

## 2014-12-25 DIAGNOSIS — I10 Essential (primary) hypertension: Secondary | ICD-10-CM | POA: Insufficient documentation

## 2014-12-25 DIAGNOSIS — K219 Gastro-esophageal reflux disease without esophagitis: Secondary | ICD-10-CM | POA: Diagnosis not present

## 2014-12-25 DIAGNOSIS — R0602 Shortness of breath: Secondary | ICD-10-CM | POA: Diagnosis present

## 2014-12-25 LAB — CBC WITH DIFFERENTIAL/PLATELET
Basophils Absolute: 0 10*3/uL (ref 0.0–0.1)
Basophils Relative: 1 %
Eosinophils Absolute: 0.9 10*3/uL — ABNORMAL HIGH (ref 0.0–0.7)
Eosinophils Relative: 14 %
HCT: 37.6 % (ref 36.0–46.0)
Hemoglobin: 12.4 g/dL (ref 12.0–15.0)
Lymphocytes Relative: 30 %
Lymphs Abs: 1.9 10*3/uL (ref 0.7–4.0)
MCH: 29.8 pg (ref 26.0–34.0)
MCHC: 33 g/dL (ref 30.0–36.0)
MCV: 90.4 fL (ref 78.0–100.0)
Monocytes Absolute: 0.5 10*3/uL (ref 0.1–1.0)
Monocytes Relative: 8 %
Neutro Abs: 3 10*3/uL (ref 1.7–7.7)
Neutrophils Relative %: 47 %
Platelets: 272 10*3/uL (ref 150–400)
RBC: 4.16 MIL/uL (ref 3.87–5.11)
RDW: 14 % (ref 11.5–15.5)
WBC: 6.3 10*3/uL (ref 4.0–10.5)

## 2014-12-25 LAB — BASIC METABOLIC PANEL
Anion gap: 12 (ref 5–15)
BUN: 13 mg/dL (ref 6–20)
CO2: 22 mmol/L (ref 22–32)
Calcium: 9.2 mg/dL (ref 8.9–10.3)
Chloride: 106 mmol/L (ref 101–111)
Creatinine, Ser: 0.87 mg/dL (ref 0.44–1.00)
GFR calc Af Amer: >60 mL/min (ref >60)
GFR calc non Af Amer: >60 mL/min (ref >60)
Glucose, Bld: 103 mg/dL — ABNORMAL HIGH (ref 65–99)
Potassium: 3.4 mmol/L — ABNORMAL LOW (ref 3.5–5.1)
Sodium: 140 mmol/L (ref 135–145)

## 2014-12-25 LAB — BRAIN NATRIURETIC PEPTIDE: B Natriuretic Peptide: 29 pg/mL (ref 0.0–100.0)

## 2014-12-25 MED ORDER — PREDNISONE 20 MG PO TABS
60.0000 mg | ORAL_TABLET | Freq: Once | ORAL | Status: AC
  Administered 2014-12-25: 60 mg via ORAL
  Filled 2014-12-25: qty 3

## 2014-12-25 MED ORDER — PREDNISONE 20 MG PO TABS: 60.0000 mg | ORAL_TABLET | Freq: Every day | ORAL | Status: DC

## 2014-12-25 MED ORDER — IPRATROPIUM-ALBUTEROL 0.5-2.5 (3) MG/3ML IN SOLN
6.0000 mL | RESPIRATORY_TRACT | Status: DC
  Administered 2014-12-25: 6 mL via RESPIRATORY_TRACT
  Filled 2014-12-25: qty 3

## 2014-12-25 NOTE — Discharge Instructions (Signed)
Asthma, Adult Shannon [name], continue to use albuterol at home 2 puffs every 6 hours for the next 2 days. Take prednisone as prescribed and see your primary care doctor within 3 days for close follow up.  If any symptoms worsen, come back to the ED immediately.  Thank you. Asthma is a condition of the lungs in which the airways tighten and narrow. Asthma can make it hard to breathe. Asthma cannot be cured, but medicine and lifestyle changes can help control it. Asthma may be started (triggered) by:  Animal skin flakes (dander).  Dust.  Cockroaches.  Pollen.  Mold.  Smoke.  Cleaning products.  Hair sprays or aerosol sprays.  Paint fumes or strong smells.  Cold air, weather changes, and winds.  Crying or laughing hard.  Stress.  Certain medicines or drugs.  Foods, such as dried fruit, potato chips, and sparkling grape juice.  Infections or conditions (colds, flu).  Exercise.  Certain medical conditions or diseases.  Exercise or tiring activities. HOME CARE   Take medicine as told by your doctor.  Use a peak flow meter as told by your doctor. A peak flow meter is a tool that measures how well the lungs are working.  Record and keep track of the peak flow meter's readings.  Understand and use the asthma action plan. An asthma action plan is a written plan for taking care of your asthma and treating your attacks.  To help prevent asthma attacks:  Do not smoke. Stay away from secondhand smoke.  Change your heating and air conditioning filter often.  Limit your use of fireplaces and wood stoves.  Get rid of pests (such as roaches and mice) and their droppings.  Throw away plants if you see mold on them.  Clean your floors. Dust regularly. Use cleaning products that do not smell.  Have someone vacuum when you are not home. Use a vacuum cleaner with a HEPA filter if possible.  Replace carpet with wood, tile, or vinyl flooring. Carpet can trap animal skin  flakes and dust.  Use allergy-proof pillows, mattress covers, and box spring covers.  Wash bed sheets and blankets every week in hot water and dry them in a dryer.  Use blankets that are made of polyester or cotton.  Clean bathrooms and kitchens with bleach. If possible, have someone repaint the walls in these rooms with mold-resistant paint. Keep out of the rooms that are being cleaned and painted.  Wash hands often. GET HELP IF:  You have make a whistling sound when breaking (wheeze), have shortness of breath, or have a cough even if taking medicine to prevent attacks.  The colored mucus you cough up (sputum) is thicker than usual.  The colored mucus you cough up changes from clear or white to yellow, green, gray, or bloody.  You have problems from the medicine you are taking such as:  A rash.  Itching.  Swelling.  Trouble breathing.  You need reliever medicines more than 2-3 times a week.  Your peak flow measurement is still at 50-79% of your personal best after following the action plan for 1 hour.  You have a fever. GET HELP RIGHT AWAY IF:   You seem to be worse and are not responding to medicine during an asthma attack.  You are short of breath even at rest.  You get short of breath when doing very little activity.  You have trouble eating, drinking, or talking.  You have chest pain.  You have a fast  heartbeat.  Your lips or fingernails start to turn blue.  You are light-headed, dizzy, or faint.  Your peak flow is less than 50% of your personal best.   This information is not intended to replace advice given to you by your health care provider. Make sure you discuss any questions you have with your health care provider.   Document Released: 07/24/2007 Document Revised: 10/26/2014 Document Reviewed: 09/03/2012 Elsevier Interactive Patient Education [employer].

## 2014-12-25 NOTE — ED Notes (Signed)
The pt has asthma and she has had  Difficult breathing for 2 weeks..  Audible faint  Expiratory wheezes

## 2014-12-25 NOTE — ED Provider Notes (Signed)
CSN: [id number]   Arrival date & time 12/25/14 0206  History  By signing my name below, I, Bethel Born, attest that this documentation has been prepared under the direction and in the presence of Tomasita Crumble, MD. Electronically Signed: Bethel Born, ED Scribe. 12/25/2014. 6:21 AM.  Chief Complaint  Patient presents with  . Shortness of Breath    HPI The history is provided by the patient. No language interpreter was used.   Shannon [name] is a 64 y.o. female with history of asthma, HTN, and HLD who presents to the Emergency Department complaining of constant SOB with gradual onset 3 weeks ago. Pt states that a couple of months ago her Advair was changed to Spiriva and the new medication does not seem to work as well. She saw her PCP 4 days ago and the medication was changed back but she states that the pharmacy has not yet received it. Her breathing is worse with exertion and laying flat. Improved with rest. She also has swelling in her feet that is not new. Pt denies unexpected weight gain, chest pain, nausea, and sweating. No recent steroid use.  Past Medical History  Diagnosis Date  . Hypertension   . Asthma   . GERD (gastroesophageal reflux disease)   . HLD (hyperlipidemia)   . Arthritis     "right knee" (05/26/2014)  . Chronic lower back pain     Past Surgical History  Procedure Laterality Date  . Tonsillectomy  ~ 1970  . Patella fracture surgery Right ~ 2009  . Fracture surgery    . Vaginal hysterectomy  1980's  . Esophagogastroduodenoscopy N/A 05/27/2014    Procedure: ESOPHAGOGASTRODUODENOSCOPY (EGD);  Surgeon: Jeani Hawking, MD;  Location: [facility] ENDOSCOPY;  Service: Endoscopy;  Laterality: N/A;    Family History  Problem Relation Age of Onset  . Hypertension Mother   . Hypertension Father   . Hypertension Sister     Social History  Substance Use Topics  . Smoking status: Former Smoker -- 0.75 packs/day for 30 years    Types: Cigarettes  . Smokeless tobacco:  Never Used     Comment: "stopped smoking in ~ 2000"  . Alcohol Use: 7.2 oz/week    0 Standard drinks or equivalent, 12 Shots of liquor per week     Comment: 05/26/2014 "4, 1 shot mixed drinks q Fri,, Sat, Sun"     Review of Systems 10 Systems reviewed and all are negative for acute change except as noted in the HPI. Home Medications   Prior to Admission medications   Medication Sig Start Date End Date Taking? Authorizing Provider  albuterol (PROVENTIL HFA;VENTOLIN HFA) 108 (90 BASE) MCG/ACT inhaler Inhale 2 puffs into the lungs every 2 (two) hours as needed for wheezing or shortness of breath (cough). 09/21/12  Yes Peter Dammen, PA-C  amLODipine (NORVASC) 10 MG tablet Take 10 mg by mouth daily.   Yes Historical Provider, MD  aspirin EC 81 MG tablet Take 81 mg by mouth daily.   Yes Historical Provider, MD  CALCIUM PO Take 1 tablet by mouth daily.   Yes Historical Provider, MD  lovastatin (ALTOPREV) 40 MG 24 hr tablet Take 80 mg by mouth at bedtime.   Yes Historical Provider, MD  metoprolol tartrate (LOPRESSOR) 25 MG tablet Take 25 mg by mouth 2 (two) times daily.   Yes Historical Provider, MD  pantoprazole (PROTONIX) 40 MG tablet Take 1 tablet (40 mg total) by mouth daily. Take 30 minutes before breakfast 05/27/14  Yes Shanker  Levora Dredge, MD  SPIRIVA HANDIHALER 18 MCG inhalation capsule INL 1 PUFF PO VIA HANDIHALER ONCE D 10/19/14  Yes Historical Provider, MD  Fluticasone-Salmeterol (ADVAIR DISKUS) 250-50 MCG/DOSE AEPB Inhale 1 puff into the lungs 2 (two) times daily. Patient not taking: Reported on 12/25/2014 09/21/12   Ivonne Andrew, PA-C    Allergies  Review of patient's allergies indicates no known allergies.  Triage Vitals: BP 124/63 mmHg  Pulse 72  Temp(Src) 97.4 F (36.3 C) (Oral)  Resp 22  Ht  (1.702 m)  Wt 171 lb 4.8 oz (77.701 kg)  BMI 26.82 kg/m2  SpO2 97%  Physical Exam  Constitutional: She is oriented to person, place, and time. She appears well-developed and  well-nourished. No distress.  HENT:  Head: Normocephalic and atraumatic.  Nose: Nose normal.  Mouth/Throat: Oropharynx is clear and moist. No oropharyngeal exudate.  Eyes: Conjunctivae and EOM are normal. Pupils are equal, round, and reactive to light. No scleral icterus.  Neck: Normal range of motion. Neck supple. No JVD present. No tracheal deviation present. No thyromegaly present.  Cardiovascular: Normal rate, regular rhythm and normal heart sounds.  Exam reveals no gallop and no friction rub.   No murmur heard. Pulmonary/Chest: Effort normal. No respiratory distress. She has wheezes. She exhibits no tenderness.  Inspiratory and expiratory wheezing in the bilateral lung fields.  Abdominal: Soft. Bowel sounds are normal. She exhibits no distension and no mass. There is no tenderness. There is no rebound and no guarding.  Musculoskeletal: Normal range of motion. She exhibits no edema or tenderness.  Lymphadenopathy:    She has no cervical adenopathy.  Neurological: She is alert and oriented to person, place, and time. No cranial nerve deficit. She exhibits normal muscle tone.  Skin: Skin is warm and dry. No rash noted. No erythema. No pallor.  Nursing note and vitals reviewed.   ED Course  Procedures   DIAGNOSTIC STUDIES: Oxygen Saturation is 97% on RA, normal by my interpretation.    COORDINATION OF CARE: 3:13 AM Discussed treatment plan which includes lab work, CXR, EKG with pt at bedside and pt agreed to plan.  Labs Reviewed  CBC WITH DIFFERENTIAL/PLATELET - Abnormal; Notable for the following:    Eosinophils Absolute 0.9 (*)    All other components within normal limits  BASIC METABOLIC PANEL - Abnormal; Notable for the following:    Potassium 3.4 (*)    Glucose, Bld 103 (*)    All other components within normal limits  BRAIN NATRIURETIC PEPTIDE    Imaging Review Dg Chest 2 View  12/25/2014  CLINICAL DATA:  Wheezing and dyspnea, onset today. EXAM: CHEST  2 VIEW  COMPARISON:  05/26/2014 FINDINGS: There is moderate hyperinflation. The lungs are clear. The pulmonary vasculature is normal. There is no pleural effusion. Hilar, mediastinal and cardiac contours are unremarkable and unchanged. IMPRESSION: Hyperinflation Electronically Signed   By: Ellery Plunk M.D.   On: 12/25/2014 04:17    I personally reviewed and evaluated these images and lab results as a part of my medical decision-making.   EKG Interpretation  Date/Time:  Sunday December 25 2014 02:13:27 EST Ventricular Rate:  74 PR Interval:  154 QRS Duration: 90 QT Interval:  424 QTC Calculation: 470 R Axis:   65 Text Interpretation:  Normal sinus rhythm Cannot rule out Anterior infarct , age undetermined Abnormal ECG No significant change since last tracing Confirmed by Erroll Luna [phone]) on 12/25/2014 3:55:48 AM    MDM   Final diagnoses:  None  Patient presents to the ED for SOB consistent with her asthma.  She denies history of CHF, although she has sleep orthopnea and worsening DOE.  She is wheezing on exam.  She was given duoneb treatments and prednisone for treatment.  Will DC with prednisone as well.  She will be getting advair from her PCP this coming week.  BNP is normal and there is no fluid on chest xray.  She now appears well and wheezing has resolved after treatment.  VS remain within her normal limits and she is safe for DC.    I personally performed the services described in this documentation, which was scribed in my presence. The recorded information has been reviewed and is accurate.      Tomasita Crumble, MD 12/25/14 [phone]

## 2015-01-03 ENCOUNTER — Other Ambulatory Visit: Payer: Self-pay

## 2015-01-03 DIAGNOSIS — Z1231 Encounter for screening mammogram for malignant neoplasm of breast: Secondary | ICD-10-CM

## 2015-02-07 ENCOUNTER — Ambulatory Visit
Admission: RE | Admit: 2015-02-07 | Discharge: 2015-02-07 | Disposition: A | Discharge status: Home / Self Care | Payer: Medicare HMO | Source: Ambulatory Visit

## 2015-02-07 DIAGNOSIS — Z1231 Encounter for screening mammogram for malignant neoplasm of breast: Secondary | ICD-10-CM

## 2016-01-05 ENCOUNTER — Other Ambulatory Visit: Payer: Self-pay | Admitting: Primary Care

## 2016-01-05 DIAGNOSIS — Z1231 Encounter for screening mammogram for malignant neoplasm of breast: Secondary | ICD-10-CM

## 2016-02-13 ENCOUNTER — Ambulatory Visit
Admission: RE | Admit: 2016-02-13 | Discharge: 2016-02-13 | Disposition: A | Discharge status: Home / Self Care | Payer: Medicare HMO | Source: Ambulatory Visit | Attending: Primary Care | Admitting: Primary Care

## 2016-02-13 DIAGNOSIS — Z1231 Encounter for screening mammogram for malignant neoplasm of breast: Secondary | ICD-10-CM

## 2017-01-13 ENCOUNTER — Other Ambulatory Visit: Payer: Self-pay | Admitting: Primary Care

## 2017-01-13 DIAGNOSIS — Z1231 Encounter for screening mammogram for malignant neoplasm of breast: Secondary | ICD-10-CM

## 2017-02-13 ENCOUNTER — Ambulatory Visit
Admission: RE | Admit: 2017-02-13 | Discharge: 2017-02-13 | Disposition: A | Discharge status: Home / Self Care | Payer: Medicare HMO | Source: Ambulatory Visit | Attending: Primary Care | Admitting: Primary Care

## 2017-02-13 DIAGNOSIS — Z1231 Encounter for screening mammogram for malignant neoplasm of breast: Secondary | ICD-10-CM

## 2017-02-24 ENCOUNTER — Encounter (HOSPITAL_COMMUNITY): Payer: Self-pay | Admitting: Emergency Medicine

## 2017-02-24 ENCOUNTER — Emergency Department (HOSPITAL_COMMUNITY)
Admission: EM | Admit: 2017-02-24 | Discharge: 2017-02-24 | Disposition: A | Discharge status: Home / Self Care | Payer: Medicare HMO | Attending: Emergency Medicine | Admitting: Emergency Medicine

## 2017-02-24 ENCOUNTER — Emergency Department (HOSPITAL_COMMUNITY): Payer: Medicare HMO

## 2017-02-24 DIAGNOSIS — M25562 Pain in left knee: Secondary | ICD-10-CM

## 2017-02-24 DIAGNOSIS — Z79899 Other long term (current) drug therapy: Secondary | ICD-10-CM | POA: Insufficient documentation

## 2017-02-24 DIAGNOSIS — M79671 Pain in right foot: Secondary | ICD-10-CM | POA: Insufficient documentation

## 2017-02-24 DIAGNOSIS — Z87891 Personal history of nicotine dependence: Secondary | ICD-10-CM | POA: Diagnosis not present

## 2017-02-24 DIAGNOSIS — W108XXA Fall (on) (from) other stairs and steps, initial encounter: Secondary | ICD-10-CM | POA: Diagnosis not present

## 2017-02-24 DIAGNOSIS — Y939 Activity, unspecified: Secondary | ICD-10-CM | POA: Diagnosis not present

## 2017-02-24 DIAGNOSIS — Z7982 Long term (current) use of aspirin: Secondary | ICD-10-CM | POA: Insufficient documentation

## 2017-02-24 DIAGNOSIS — Y92008 Other place in unspecified non-institutional (private) residence as the place of occurrence of the external cause: Secondary | ICD-10-CM | POA: Insufficient documentation

## 2017-02-24 DIAGNOSIS — S99912A Unspecified injury of left ankle, initial encounter: Secondary | ICD-10-CM | POA: Diagnosis present

## 2017-02-24 DIAGNOSIS — S93402A Sprain of unspecified ligament of left ankle, initial encounter: Secondary | ICD-10-CM | POA: Insufficient documentation

## 2017-02-24 DIAGNOSIS — J45909 Unspecified asthma, uncomplicated: Secondary | ICD-10-CM | POA: Insufficient documentation

## 2017-02-24 DIAGNOSIS — W19XXXA Unspecified fall, initial encounter: Secondary | ICD-10-CM

## 2017-02-24 DIAGNOSIS — E785 Hyperlipidemia, unspecified: Secondary | ICD-10-CM | POA: Insufficient documentation

## 2017-02-24 DIAGNOSIS — Y998 Other external cause status: Secondary | ICD-10-CM | POA: Diagnosis not present

## 2017-02-24 DIAGNOSIS — I10 Essential (primary) hypertension: Secondary | ICD-10-CM | POA: Diagnosis not present

## 2017-02-24 MED ORDER — ACETAMINOPHEN 500 MG PO TABS
1000.0000 mg | ORAL_TABLET | Freq: Once | ORAL | Status: AC
Start: 2017-02-24 — End: 2017-02-24
  Administered 2017-02-24: 1000 mg via ORAL
  Filled 2017-02-24: qty 2

## 2017-02-24 NOTE — Discharge Instructions (Signed)
Your evaluation today is reassuring, all x-rays negative for acute fracture, does show some degenerative joint disease.  Treat pain with Tylenol or ibuprofen, keep knee and ankle elevated as much as possible, use brace and knee sleeve to help with compression and ice to help with swelling.  These follow-up with Dr. Aundria Rud with orthopedics if symptoms are not improving.  If you have significantly worsening pain or unable to bend or move the joints, or have redness, warmth, or fevers please return to the ED for reevaluation.

## 2017-02-24 NOTE — ED Notes (Signed)
Patient left at this time with all belongings. 

## 2017-02-24 NOTE — ED Notes (Signed)
Called for patient, no answer in WR 

## 2017-02-24 NOTE — ED Provider Notes (Signed)
[facility] EMERGENCY DEPARTMENT Provider Note   CSN: [id number] Arrival date & time: 02/24/17  1837     History   Chief Complaint Chief Complaint  Patient presents with  . Fall    HPI  Shannon [name] is a 67 y.o. Female with a history of hypertension, asthma, GERD, HLD, arthritis, and chronic low back pain, presents to the ED after a mechanical fall.  Patient reports she missed a step while going down her porch steps today.  Patient denies hitting her head or any loss.  Patient complaining primarily of left ankle and left knee pain as well as some mild right foot pain.  Patient has continued to be ambulatory despite this pain but it is very uncomfortable.  Patient reports some chronic lower back and hip pain, but this is not worsened since the fall.  Patient denies any numbness or tingling of the extremities, no loss of bowel or bladder control.  Patient reports small abrasion to her left shin, and left palm but no large or lacerations.      Past Medical History:  Diagnosis Date  . Arthritis    "right knee" (05/26/2014)  . Asthma   . Chronic lower back pain   . GERD (gastroesophageal reflux disease)   . HLD (hyperlipidemia)   . Hypertension     Patient Active Problem List   Diagnosis Date Noted  . Chest pain 05/26/2014  . AKI (acute kidney injury) (HCC) 05/26/2014  . Pain in the chest 05/26/2014  . Diarrhea 05/26/2014  . Alcohol intoxication (HCC)   . Hypokalemia   . G E R D 07/02/2006  . Dysphagia 07/02/2006  . HYPERCHOLESTEROLEMIA 04/17/2006  . HYPERTENSION, BENIGN SYSTEMIC 04/17/2006  . Allergic rhinitis 04/17/2006    Past Surgical History:  Procedure Laterality Date  . ESOPHAGOGASTRODUODENOSCOPY N/A 05/27/2014   Procedure: ESOPHAGOGASTRODUODENOSCOPY (EGD);  Surgeon: Jeani Hawking, MD;  Location: [facility] ENDOSCOPY;  Service: Endoscopy;  Laterality: N/A;  . FRACTURE SURGERY    . PATELLA FRACTURE SURGERY Right ~ 2009  . TONSILLECTOMY  ~ 1970  .  VAGINAL HYSTERECTOMY  1980's    OB History    No data available       Home Medications    Prior to Admission medications   Medication Sig Start Date End Date Taking? Authorizing Provider  albuterol (PROVENTIL HFA;VENTOLIN HFA) 108 (90 BASE) MCG/ACT inhaler Inhale 2 puffs into the lungs every 2 (two) hours as needed for wheezing or shortness of breath (cough). 09/21/12   Ivonne Andrew, PA-C  amLODipine (NORVASC) 10 MG tablet Take 10 mg by mouth daily.    [provider]  aspirin EC 81 MG tablet Take 81 mg by mouth daily.    [provider]  CALCIUM PO Take 1 tablet by mouth daily.    [provider]  Fluticasone-Salmeterol (ADVAIR DISKUS) 250-50 MCG/DOSE AEPB Inhale 1 puff into the lungs 2 (two) times daily. Patient not taking: Reported on 12/25/2014 09/21/12   Ivonne Andrew, PA-C  lovastatin (ALTOPREV) 40 MG 24 hr tablet Take 80 mg by mouth at bedtime.    [provider]  metoprolol tartrate (LOPRESSOR) 25 MG tablet Take 25 mg by mouth 2 (two) times daily.    [provider]  pantoprazole (PROTONIX) 40 MG tablet Take 1 tablet (40 mg total) by mouth daily. Take 30 minutes before breakfast 05/27/14   Ghimire, Werner Lean, MD  predniSONE (DELTASONE) 20 MG tablet Take 3 tablets (60 mg total) by mouth daily. 12/25/14  Tomasita Crumble, MD  SPIRIVA HANDIHALER 18 MCG inhalation capsule INL 1 PUFF PO VIA HANDIHALER ONCE D 10/19/14   [provider]    Family History Family History  Problem Relation Age of Onset  . Hypertension Mother   . Hypertension Father   . Hypertension Sister     Social History Social History   Tobacco Use  . Smoking status: Former Smoker    Types: Cigarettes  . Smokeless tobacco: Never Used  Substance Use Topics  . Alcohol use: Yes    Alcohol/week: 0.0 oz  . Drug use: No     Allergies   Patient has no known allergies.   Review of Systems Review of Systems  Constitutional: Negative for chills and fever.    Musculoskeletal: Positive for arthralgias and joint swelling.       L knee and ankle, right foot  Neurological: Negative for weakness and numbness.     Physical Exam Updated Vital Signs BP 132/69 (BP Location: Right Arm)   Pulse 90   Temp 98.9 F (37.2 C) (Oral)   Resp 18   SpO2 98%   Physical Exam  Constitutional: She appears well-developed and well-nourished. No distress.  HENT:  Head: Normocephalic and atraumatic.  Eyes: Right eye exhibits no discharge. Left eye exhibits no discharge.  Neck: Normal range of motion. Neck supple.  C-spine NTTP, normal ROM  Cardiovascular: Normal rate, regular rhythm and normal heart sounds.  Pulmonary/Chest: Effort normal and breath sounds normal. No respiratory distress.  Abdominal: Soft. Bowel sounds are normal. She exhibits no distension. There is no tenderness.  Musculoskeletal:  Swelling and tenderness over the lateral malleolus of the left ankle, no obvious deformity, no erythema or warmth, no wounds, nontender to palpation over the medial malleolus or calcaneus, flexion intact although limited by discomfort, sensation intact, DP and TP pulses 2+ bilaterally Left knee with minimal swelling over the patella, no obvious deformity, warmth or erythema, full extension and flexion, distal pulses intact as stated above. Right foot with mild tenderness over the top of the foot, no swelling or deformity, no erythema or warmth, no wounds, sensation intact, DP pulse 2+, dorsi and plantar flexion are intact. All other joints supple and easily movable, all compartments soft  Neurological: She is alert. Coordination normal.  Skin: Skin is warm and dry. Capillary refill takes less than 2 seconds. She is not diaphoretic.  Small surface abrasion to left shin and left palm  Psychiatric: She has a normal mood and affect. Her behavior is normal.  Nursing note and vitals reviewed.    ED Treatments / Results  Labs (all labs ordered are listed, but only  abnormal results are displayed) Labs Reviewed - No data to display  EKG  EKG Interpretation None       Radiology Dg Ankle Complete Left  Result Date: 02/24/2017 CLINICAL DATA:  Larey Seat off porch and injured left ankle. EXAM: LEFT ANKLE COMPLETE - 3+ VIEW COMPARISON:  None. FINDINGS: The ankle mortise is maintained. No acute ankle fracture is identified. No osteochondral lesion. No definite joint effusion. The visualized mid and hindfoot bony structures are intact. No acute fracture. Small calcaneal heel spur is noted. IMPRESSION: No acute ankle or hindfoot fracture. Electronically Signed   By: Rudie Meyer M.D.   On: 02/24/2017 20:53   Dg Knee Complete 4 Views Left  Result Date: 02/24/2017 CLINICAL DATA:  Larey Seat.  Left knee pain. EXAM: LEFT KNEE - COMPLETE 4+ VIEW COMPARISON:  None. FINDINGS: Tricompartmental degenerative changes are  noted along with chondrocalcinosis. No acute fracture or osteochondral lesion. No joint effusion. IMPRESSION: Tricompartmental degenerative changes and chondrocalcinosis but no acute fracture. No joint effusion. Electronically Signed   By: Rudie Meyer M.D.   On: 02/24/2017 20:55   Dg Foot Complete Right  Result Date: 02/24/2017 CLINICAL DATA:  Larey Seat tonight and injured right foot. EXAM: RIGHT FOOT COMPLETE - 3+ VIEW COMPARISON:  None. FINDINGS: The joint spaces are fairly well maintained. Mild to moderate degenerative changes at the first MTP joint with hallux valgus deformity. No acute fracture is identified. No osteochondral lesions. IMPRESSION: No acute bony findings. Electronically Signed   By: Rudie Meyer M.D.   On: 02/24/2017 20:56    Procedures Procedures (including critical care time)  Medications Ordered in ED Medications - No data to display   Initial Impression / Assessment and Plan / ED Course  I have reviewed the triage vital signs and the nursing notes.  Pertinent labs & imaging results that were available during my care of the patient were  reviewed by me and considered in my medical decision making (see chart for details).  Patient presents complaining of left knee, left ankle and right foot pain after mechanical fall.  Patient did not hit her head, no loss of consciousness, no neck pain no spinal tenderness.  Left ankle likely sprain, tenderness over the lateral malleolus, neurovascularly intact, x-ray without signs of fracture.  Left knee with minimal swelling, no concern for septic joint, normal range of motion, neurovascularly intact, x-ray shows tricompartmental degenerative changes but no fracture or joint effusion.  Right foot with very minimal tenderness, patient able to bear weight without difficulty, neurovascularly intact, x-ray without evidence of fracture.  Will place patient in knee sleeve and left ASO brace and provide crutches.  Pain treated in the ED with Tylenol.  Counseled on RICE therapy.  Patient to follow-up with orthopedics symptoms not improving.  Strict return precautions discussed.  Patient expresses understanding and agrees with plan  Final Clinical Impressions(s) / ED Diagnoses   Final diagnoses:  Fall, initial encounter  Acute pain of left knee  Sprain of left ankle, unspecified ligament, initial encounter  Right foot pain    ED Discharge Orders    None       Dartha Lodge, [place] 02/25/17 1213    Pricilla Loveless, MD 02/26/17 [phone]

## 2017-02-24 NOTE — ED Triage Notes (Signed)
Patient missed her step and fell at porch (home) this evening , no LOC , reports pain at left ankle , left knee and right foot pain .

## 2017-02-24 NOTE — Progress Notes (Signed)
Orthopedic Tech Progress Note Patient Details:  EARLY SHRAKE 1950-03-31 [id number]  Ortho Devices Type of Ortho Device: Knee Sleeve, ASO Ortho Device/Splint Location: lle Ortho Device/Splint Interventions: Ordered, Application, Adjustment   Post Interventions Patient Tolerated: Well Instructions Provided: Care of device, Adjustment of device   Trinna Post 02/24/2017, 11:28 PM

## 2018-02-02 ENCOUNTER — Other Ambulatory Visit: Payer: Self-pay | Admitting: Nurse Practitioner

## 2018-02-02 DIAGNOSIS — Z1231 Encounter for screening mammogram for malignant neoplasm of breast: Secondary | ICD-10-CM

## 2018-03-11 ENCOUNTER — Ambulatory Visit
Admission: RE | Admit: 2018-03-11 | Discharge: 2018-03-11 | Disposition: A | Discharge status: Home / Self Care | Payer: Medicare HMO | Source: Ambulatory Visit | Attending: Nurse Practitioner | Admitting: Nurse Practitioner

## 2018-03-11 DIAGNOSIS — Z1231 Encounter for screening mammogram for malignant neoplasm of breast: Secondary | ICD-10-CM

## 2018-04-19 DIAGNOSIS — G459 Transient cerebral ischemic attack, unspecified: Secondary | ICD-10-CM

## 2018-04-19 HISTORY — DX: Transient cerebral ischemic attack, unspecified: G45.9

## 2018-05-04 ENCOUNTER — Emergency Department (HOSPITAL_COMMUNITY): Payer: Medicare HMO

## 2018-05-04 ENCOUNTER — Other Ambulatory Visit: Payer: Self-pay

## 2018-05-04 ENCOUNTER — Observation Stay (HOSPITAL_COMMUNITY)
Admission: EM | Admit: 2018-05-04 | Discharge: 2018-05-05 | Disposition: A | Discharge status: Home / Self Care | Payer: Medicare HMO | Attending: Internal Medicine | Admitting: Internal Medicine

## 2018-05-04 ENCOUNTER — Observation Stay (HOSPITAL_BASED_OUTPATIENT_CLINIC_OR_DEPARTMENT_OTHER): Payer: Medicare HMO

## 2018-05-04 ENCOUNTER — Encounter (HOSPITAL_COMMUNITY): Payer: Self-pay | Admitting: Emergency Medicine

## 2018-05-04 DIAGNOSIS — E785 Hyperlipidemia, unspecified: Secondary | ICD-10-CM | POA: Diagnosis not present

## 2018-05-04 DIAGNOSIS — I351 Nonrheumatic aortic (valve) insufficiency: Secondary | ICD-10-CM | POA: Diagnosis not present

## 2018-05-04 DIAGNOSIS — Z87891 Personal history of nicotine dependence: Secondary | ICD-10-CM | POA: Diagnosis not present

## 2018-05-04 DIAGNOSIS — Z7982 Long term (current) use of aspirin: Secondary | ICD-10-CM | POA: Insufficient documentation

## 2018-05-04 DIAGNOSIS — I10 Essential (primary) hypertension: Secondary | ICD-10-CM | POA: Insufficient documentation

## 2018-05-04 DIAGNOSIS — Z79899 Other long term (current) drug therapy: Secondary | ICD-10-CM | POA: Diagnosis not present

## 2018-05-04 DIAGNOSIS — R531 Weakness: Secondary | ICD-10-CM

## 2018-05-04 DIAGNOSIS — J45909 Unspecified asthma, uncomplicated: Secondary | ICD-10-CM | POA: Insufficient documentation

## 2018-05-04 DIAGNOSIS — G459 Transient cerebral ischemic attack, unspecified: Principal | ICD-10-CM | POA: Diagnosis present

## 2018-05-04 DIAGNOSIS — R7303 Prediabetes: Secondary | ICD-10-CM | POA: Insufficient documentation

## 2018-05-04 HISTORY — DX: Transient cerebral ischemic attack, unspecified: G45.9

## 2018-05-04 LAB — URINALYSIS, ROUTINE W REFLEX MICROSCOPIC
Bilirubin Urine: NEGATIVE
Glucose, UA: NEGATIVE mg/dL
Hgb urine dipstick: NEGATIVE
Ketones, ur: NEGATIVE mg/dL
Leukocytes,Ua: NEGATIVE
Nitrite: NEGATIVE
Protein, ur: NEGATIVE mg/dL
Specific Gravity, Urine: 1.031 — ABNORMAL HIGH (ref 1.005–1.030)
pH: 5 (ref 5.0–8.0)

## 2018-05-04 LAB — DIFFERENTIAL
Abs Immature Granulocytes: 0.02 10*3/uL (ref 0.00–0.07)
Basophils Absolute: 0 10*3/uL (ref 0.0–0.1)
Basophils Relative: 1 %
Eosinophils Absolute: 0.3 10*3/uL (ref 0.0–0.5)
Eosinophils Relative: 6 %
Immature Granulocytes: 1 %
Lymphocytes Relative: 39 %
Lymphs Abs: 1.7 10*3/uL (ref 0.7–4.0)
Monocytes Absolute: 0.4 10*3/uL (ref 0.1–1.0)
Monocytes Relative: 9 %
Neutro Abs: 2 10*3/uL (ref 1.7–7.7)
Neutrophils Relative %: 44 %

## 2018-05-04 LAB — CBC
HCT: 41.5 % (ref 36.0–46.0)
HCT: 40.6 % (ref 36.0–46.0)
Hemoglobin: 12.8 g/dL (ref 12.0–15.0)
Hemoglobin: 12.8 g/dL (ref 12.0–15.0)
MCH: 30.1 pg (ref 26.0–34.0)
MCH: 30.3 pg (ref 26.0–34.0)
MCHC: 30.8 g/dL (ref 30.0–36.0)
MCHC: 31.5 g/dL (ref 30.0–36.0)
MCV: 97.6 fL (ref 80.0–100.0)
MCV: 96 fL (ref 80.0–100.0)
Platelets: 240 10*3/uL (ref 150–400)
Platelets: 246 10*3/uL (ref 150–400)
RBC: 4.25 MIL/uL (ref 3.87–5.11)
RBC: 4.23 MIL/uL (ref 3.87–5.11)
RDW: 13.2 % (ref 11.5–15.5)
RDW: 13.1 % (ref 11.5–15.5)
WBC: 4.4 10*3/uL (ref 4.0–10.5)
WBC: 5.8 10*3/uL (ref 4.0–10.5)
nRBC: 0 % (ref 0.0–0.2)
nRBC: 0 % (ref 0.0–0.2)

## 2018-05-04 LAB — LIPID PANEL
Cholesterol: 247 mg/dL — ABNORMAL HIGH (ref 0–200)
HDL: 77 mg/dL (ref >40)
LDL Cholesterol: 151 mg/dL — ABNORMAL HIGH (ref 0–99)
Total CHOL/HDL Ratio: 3.2 RATIO
Triglycerides: 95 mg/dL (ref <150)
VLDL: 19 mg/dL (ref 0–40)

## 2018-05-04 LAB — COMPREHENSIVE METABOLIC PANEL
ALT: 19 U/L (ref 0–44)
AST: 22 U/L (ref 15–41)
Albumin: 3.6 g/dL (ref 3.5–5.0)
Alkaline Phosphatase: 80 U/L (ref 38–126)
Anion gap: 10 (ref 5–15)
BUN: 16 mg/dL (ref 8–23)
CO2: 17 mmol/L — ABNORMAL LOW (ref 22–32)
Calcium: 8.6 mg/dL — ABNORMAL LOW (ref 8.9–10.3)
Chloride: 113 mmol/L — ABNORMAL HIGH (ref 98–111)
Creatinine, Ser: 1.07 mg/dL — ABNORMAL HIGH (ref 0.44–1.00)
GFR calc Af Amer: >60 mL/min (ref >60)
GFR calc non Af Amer: 53 mL/min — ABNORMAL LOW (ref >60)
Glucose, Bld: 95 mg/dL (ref 70–99)
Potassium: 3.6 mmol/L (ref 3.5–5.1)
Sodium: 140 mmol/L (ref 135–145)
Total Bilirubin: 0.6 mg/dL (ref 0.3–1.2)
Total Protein: 6.6 g/dL (ref 6.5–8.1)

## 2018-05-04 LAB — RAPID URINE DRUG SCREEN, HOSP PERFORMED
Amphetamines: NOT DETECTED
Barbiturates: NOT DETECTED
Benzodiazepines: NOT DETECTED
Cocaine: NOT DETECTED
Opiates: NOT DETECTED
Tetrahydrocannabinol: NOT DETECTED

## 2018-05-04 LAB — CBG MONITORING, ED: Glucose-Capillary: 90 mg/dL (ref 70–99)

## 2018-05-04 LAB — ECHOCARDIOGRAM COMPLETE
Height: 66 in
Weight: 2610.25 oz

## 2018-05-04 LAB — CREATININE, SERUM
Creatinine, Ser: 0.99 mg/dL (ref 0.44–1.00)
GFR calc Af Amer: >60 mL/min (ref >60)
GFR calc non Af Amer: 59 mL/min — ABNORMAL LOW (ref >60)

## 2018-05-04 LAB — I-STAT TROPONIN, ED: Troponin i, poc: 0.14 ng/mL (ref 0.00–0.08)

## 2018-05-04 LAB — TROPONIN I
Troponin I: <0.03 ng/mL (ref <0.03)
Troponin I: <0.03 ng/mL (ref <0.03)

## 2018-05-04 LAB — PROTIME-INR
INR: 1 (ref 0.8–1.2)
Prothrombin Time: 12.8 seconds (ref 11.4–15.2)

## 2018-05-04 LAB — APTT: aPTT: 30 seconds (ref 24–36)

## 2018-05-04 LAB — I-STAT CREATININE, ED: Creatinine, Ser: 1.1 mg/dL — ABNORMAL HIGH (ref 0.44–1.00)

## 2018-05-04 LAB — ETHANOL: Alcohol, Ethyl (B): 157 mg/dL — ABNORMAL HIGH (ref <10)

## 2018-05-04 LAB — HEMOGLOBIN A1C
Hgb A1c MFr Bld: 6.4 % — ABNORMAL HIGH (ref 4.8–5.6)
Mean Plasma Glucose: 136.98 mg/dL

## 2018-05-04 MED ORDER — CLOPIDOGREL BISULFATE 75 MG PO TABS
75.0000 mg | ORAL_TABLET | Freq: Every day | ORAL | Status: DC
  Administered 2018-05-04 – 2018-05-05 (×2): 75 mg via ORAL
  Filled 2018-05-04 (×2): qty 1

## 2018-05-04 MED ORDER — SODIUM CHLORIDE 0.9% FLUSH
3.0000 mL | Freq: Two times a day (BID) | INTRAVENOUS | Status: DC
  Administered 2018-05-04 – 2018-05-05 (×2): 3 mL via INTRAVENOUS

## 2018-05-04 MED ORDER — ENOXAPARIN SODIUM 40 MG/0.4ML ~~LOC~~ SOLN
40.0000 mg | SUBCUTANEOUS | Status: DC
  Administered 2018-05-04: 40 mg via SUBCUTANEOUS
  Filled 2018-05-04: qty 0.4

## 2018-05-04 MED ORDER — ATORVASTATIN CALCIUM 40 MG PO TABS
40.0000 mg | ORAL_TABLET | Freq: Every day | ORAL | Status: DC
  Administered 2018-05-04: 40 mg via ORAL
  Filled 2018-05-04: qty 1

## 2018-05-04 MED ORDER — LORATADINE 10 MG PO TABS
10.0000 mg | ORAL_TABLET | Freq: Every day | ORAL | Status: DC
  Administered 2018-05-04 – 2018-05-05 (×2): 10 mg via ORAL
  Filled 2018-05-04 (×2): qty 1

## 2018-05-04 MED ORDER — ACETAMINOPHEN 325 MG PO TABS: 650.0000 mg | ORAL_TABLET | Freq: Four times a day (QID) | ORAL | Status: DC | PRN

## 2018-05-04 MED ORDER — ASPIRIN EC 81 MG PO TBEC
81.0000 mg | DELAYED_RELEASE_TABLET | Freq: Every day | ORAL | Status: DC
  Administered 2018-05-04 – 2018-05-05 (×2): 81 mg via ORAL
  Filled 2018-05-04 (×2): qty 1

## 2018-05-04 MED ORDER — FLUOXETINE HCL 10 MG PO CAPS
10.0000 mg | ORAL_CAPSULE | Freq: Every day | ORAL | Status: DC
  Administered 2018-05-04 – 2018-05-05 (×2): 10 mg via ORAL
  Filled 2018-05-04 (×2): qty 1

## 2018-05-04 MED ORDER — FLUTICASONE FUROATE-VILANTEROL 200-25 MCG/INH IN AEPB
1.0000 | INHALATION_SPRAY | Freq: Every day | RESPIRATORY_TRACT | Status: DC
  Administered 2018-05-04 – 2018-05-05 (×2): 1 via RESPIRATORY_TRACT
  Filled 2018-05-04: qty 28

## 2018-05-04 MED ORDER — STROKE: EARLY STAGES OF RECOVERY BOOK
Freq: Once | Status: AC
  Administered 2018-05-04: 11:00:00

## 2018-05-04 MED ORDER — LORAZEPAM 1 MG PO TABS
0.5000 mg | ORAL_TABLET | Freq: Once | ORAL | Status: AC
  Administered 2018-05-04: 0.5 mg via ORAL
  Filled 2018-05-04: qty 1

## 2018-05-04 MED ORDER — FLUTICASONE PROPIONATE 50 MCG/ACT NA SUSP
1.0000 | Freq: Every day | NASAL | Status: DC
  Administered 2018-05-04 – 2018-05-05 (×2): 1 via NASAL
  Filled 2018-05-04: qty 16

## 2018-05-04 MED ORDER — IOPAMIDOL (ISOVUE-370) INJECTION 76%
INTRAVENOUS | Status: AC
  Filled 2018-05-04: qty 100

## 2018-05-04 MED ORDER — ACETAMINOPHEN 650 MG RE SUPP: 650.0000 mg | Freq: Four times a day (QID) | RECTAL | Status: DC | PRN

## 2018-05-04 MED ORDER — IOPAMIDOL (ISOVUE-370) INJECTION 76%
75.0000 mL | Freq: Once | INTRAVENOUS | Status: AC | PRN
  Administered 2018-05-04: 75 mL via INTRAVENOUS

## 2018-05-04 MED ORDER — GABAPENTIN 300 MG PO CAPS
600.0000 mg | ORAL_CAPSULE | Freq: Every day | ORAL | Status: DC
  Administered 2018-05-04: 600 mg via ORAL
  Filled 2018-05-04: qty 2

## 2018-05-04 NOTE — Progress Notes (Addendum)
STROKE TEAM PROGRESS NOTE   INTERVAL HISTORY Her son is at the bedside.  She reported hand numbness and weakness in her L hand at 4a (she went to bed at 2a). She could move her whole arm up and down but could not move her fingers at all. She still has some residual numbness in her left hand. I have reviewed history of present illness in detail with the patient.  Vitals:   05/04/18 0715 05/04/18 0730 05/04/18 0745 05/04/18 1052  BP: 130/81 133/67 125/60 135/68  Pulse:    72  Resp: 17 (!) 21 14 16   Temp:    98.2 F (36.8 C)  TempSrc:    Oral  SpO2:    100%  Weight:      Height:        CBC:  Recent Labs  Lab 05/04/18 0600 05/04/18 1030  WBC 4.4 5.8  NEUTROABS 2.0  --   HGB 12.8 12.8  HCT 41.5 40.6  MCV 97.6 96.0  PLT 240 246    Basic Metabolic Panel:  Recent Labs  Lab 05/04/18 0600 05/04/18 0605  NA 140  --   K 3.6  --   CL 113*  --   CO2 17*  --   GLUCOSE 95  --   BUN 16  --   CREATININE 1.07* 1.10*  CALCIUM 8.6*  --    Lipid Panel:     Component Value Date/Time   CHOL 174 05/26/2014 0512   TRIG 145 05/26/2014 0512   HDL 59 05/26/2014 0512   CHOLHDL 2.9 05/26/2014 0512   VLDL 29 05/26/2014 0512   LDLCALC 86 05/26/2014 0512   HgbA1c:  Lab Results  Component Value Date   HGBA1C 6.1 (H) 05/26/2014   Urine Drug Screen:     Component Value Date/Time   LABOPIA NONE DETECTED 05/04/2018 0803   COCAINSCRNUR NONE DETECTED 05/04/2018 0803   LABBENZ NONE DETECTED 05/04/2018 0803   AMPHETMU NONE DETECTED 05/04/2018 0803   THCU NONE DETECTED 05/04/2018 0803   LABBARB NONE DETECTED 05/04/2018 0803    Alcohol Level     Component Value Date/Time   ETH 157 (H) 05/04/2018 0600    IMAGING Ct Angio Head W Or Wo Contrast  Result Date: 05/04/2018 CLINICAL DATA:  Weakness, left-sided, with dizziness EXAM: CT ANGIOGRAPHY HEAD AND NECK TECHNIQUE: Multidetector CT imaging of the head and neck was performed using the standard protocol during bolus administration of  intravenous contrast. Multiplanar CT image reconstructions and MIPs were obtained to evaluate the vascular anatomy. Carotid stenosis measurements (when applicable) are obtained utilizing NASCET criteria, using the distal internal carotid diameter as the denominator. CONTRAST:  15mL ISOVUE-370 IOPAMIDOL (ISOVUE-370) INJECTION 76% COMPARISON:  Noncontrast head CT earlier today FINDINGS: CTA NECK FINDINGS Aortic arch: Atherosclerosis.  No acute finding.  3 vessel branching Right carotid system: Mild narrowing at the ICA bulb due to atherosclerotic plaque. No ulceration or evident dissection. Beaded appearance of the ICA. Left carotid system: Atheromatous wall thickening of the common carotid and proximal ICA without flow limiting stenosis. ICA beading without ulceration or visible dissection. Vertebral arteries: No proximal subclavian stenosis. Both vertebral arteries are widely patent to the dura. No superimposed beading or dissection is seen Skeleton: Spondylosis.  No acute or aggressive finding Other neck: Large bilateral submandibular gland calculi. No superimposed acute inflammation. Upper chest: Emphysema Review of the MIP images confirms the above findings CTA HEAD FINDINGS Anterior circulation: Mild atherosclerotic plaque along the carotid siphons. No branch occlusion or  flow limiting stenosis. Negative for aneurysm Posterior circulation: Widely patent vertebral and basilar arteries. Hypoplastic P1 segments. No branch occlusion. Negative for aneurysm. Venous sinuses: Patent Anatomic variants: As above Delayed phase: Not obtained in the emergent setting Review of the MIP images confirms the above findings IMPRESSION: 1. No emergent finding. 2. Cervical and intracranial atherosclerosis without flow limiting stenosis of major vessels. 3. 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 4. Incidental large submandibular gland calculi. 5. Emphysema. Electronically Signed   By: Marnee Spring M.D.    On: 05/04/2018 06:24   Dg Chest 2 View  Result Date: 05/04/2018 CLINICAL DATA:  Stroke-like symptoms EXAM: CHEST - 2 VIEW COMPARISON:  12/25/2014 FINDINGS: Apical emphysematous change. Hyperinflation with diaphragm flattening. There is no edema, consolidation, effusion, or pneumothorax. Normal heart size and mediastinal contours. Artifact from EKG leads. IMPRESSION: 1. No acute finding. 2. Emphysema. Electronically Signed   By: Marnee Spring M.D.   On: 05/04/2018 07:15   Ct Angio Neck W Or Wo Contrast  Result Date: 05/04/2018 CLINICAL DATA:  Weakness, left-sided, with dizziness EXAM: CT ANGIOGRAPHY HEAD AND NECK TECHNIQUE: Multidetector CT imaging of the head and neck was performed using the standard protocol during bolus administration of intravenous contrast. Multiplanar CT image reconstructions and MIPs were obtained to evaluate the vascular anatomy. Carotid stenosis measurements (when applicable) are obtained utilizing NASCET criteria, using the distal internal carotid diameter as the denominator. CONTRAST:  21mL ISOVUE-370 IOPAMIDOL (ISOVUE-370) INJECTION 76% COMPARISON:  Noncontrast head CT earlier today FINDINGS: CTA NECK FINDINGS Aortic arch: Atherosclerosis.  No acute finding.  3 vessel branching Right carotid system: Mild narrowing at the ICA bulb due to atherosclerotic plaque. No ulceration or evident dissection. Beaded appearance of the ICA. Left carotid system: Atheromatous wall thickening of the common carotid and proximal ICA without flow limiting stenosis. ICA beading without ulceration or visible dissection. Vertebral arteries: No proximal subclavian stenosis. Both vertebral arteries are widely patent to the dura. No superimposed beading or dissection is seen Skeleton: Spondylosis.  No acute or aggressive finding Other neck: Large bilateral submandibular gland calculi. No superimposed acute inflammation. Upper chest: Emphysema Review of the MIP images confirms the above findings CTA HEAD  FINDINGS Anterior circulation: Mild atherosclerotic plaque along the carotid siphons. No branch occlusion or flow limiting stenosis. Negative for aneurysm Posterior circulation: Widely patent vertebral and basilar arteries. Hypoplastic P1 segments. No branch occlusion. Negative for aneurysm. Venous sinuses: Patent Anatomic variants: As above Delayed phase: Not obtained in the emergent setting Review of the MIP images confirms the above findings IMPRESSION: 1. No emergent finding. 2. Cervical and intracranial atherosclerosis without flow limiting stenosis of major vessels. 3. 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 4. Incidental large submandibular gland calculi. 5. Emphysema. Electronically Signed   By: Marnee Spring M.D.   On: 05/04/2018 06:24   Mr Brain Wo Contrast  Result Date: 05/04/2018 CLINICAL DATA:  Left-sided weakness in dizziness. EXAM: MRI HEAD WITHOUT CONTRAST TECHNIQUE: Multiplanar, multiecho pulse sequences of the brain and surrounding structures were obtained without intravenous contrast. COMPARISON:  CT studies earlier same day FINDINGS: Brain: Diffusion imaging does not show any acute or subacute infarction. There are chronic small-vessel ischemic changes of pons. No focal cerebellar finding. Few old small vessel insults of the thalami. No abnormality of the hemispheric white matter or cortex. No mass lesion, hemorrhage, hydrocephalus or extra-axial collection. 3-4 mm dural calcification in the right posterior temporal region as previously seen, not significant. Vascular: Major  vessels at the base of the brain show flow. Skull and upper cervical spine: Negative Sinuses/Orbits: Ordinary mild seasonal mucosal thickening. Orbits negative. Other: None IMPRESSION: No acute finding. Chronic small-vessel ischemic changes of the pons. Few old small vessel infarctions of the thalami. Electronically Signed   By: Paulina Fusi M.D.   On: 05/04/2018 09:07   Ct Head Code Stroke  Wo Contrast  Result Date: 05/04/2018 CLINICAL DATA:  Code stroke.  Left-sided weakness EXAM: CT HEAD WITHOUT CONTRAST TECHNIQUE: Contiguous axial images were obtained from the base of the skull through the vertex without intravenous contrast. COMPARISON:  None. FINDINGS: Brain: No evidence of acute infarction, hemorrhage, hydrocephalus, extra-axial collection or mass effect. 9 mm calcification along the right cerebral convexity could reflect a tiny meningioma. Vascular: No hyperdense vessel Skull: Negative Sinuses/Orbits: Negative Other: These results were communicated to Dr. Wilford Corner at 6:09 amon 3/16/2020by text page via the [facility] messaging system. ASPECTS [facility] Stroke Program Early CT Score) - Ganglionic level infarction (caudate, lentiform nuclei, internal capsule, insula, M1-M3 cortex): 7 - Supraganglionic infarction (M4-M6 cortex): 3 Total score (0-10 with 10 being normal): 10 IMPRESSION: No acute finding.ASPECTS is 10. Electronically Signed   By: Marnee Spring M.D.   On: 05/04/2018 06:11    PHYSICAL EXAM Pleasant middle-aged African-American lady not in distress. . Afebrile. Head is nontraumatic. Neck is supple without bruit.    Cardiac exam no murmur or gallop. Lungs are clear to auscultation. Distal pulses are well felt. Neurological Exam ;  Awake  Alert oriented x 3. Normal speech and language.eye movements full without nystagmus.fundi were not visualized. Vision acuity and fields appear normal. Hearing is normal. Palatal movements are normal. Face symmetric. Tongue midline. Normal strength, tone, reflexes and coordination. Subjective diminished left hand and forearm sensation. Gait deferred.  ASSESSMENT/PLAN Shannon [name] is a 68 y.o. female with history of asthma, HTN, HLD, chronic back pain presenting with L arm weakness.   R brain TIA likely small vessel disease  Code Stroke CT head No acute stroke. ASPECTS 10.     CTA head & neck no LVO, atherosclerosis. FMD B distal ICA.  Incidental large submandibular gland calculi. Emphysema.  MRI  No acute stroke. 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   2D Echo  pending   LDL pending   HgbA1c 6.4  Lovenox 40 mg sq daily for VTE prophylaxis  aspirin 81 mg daily prior to admission, now on aspirin 81 mg daily. Given TIA with ABCD2 score >4, recommend aspirin 81 mg and plavix 75 mg daily x 3 weeks, then PLAVIX alone. Orders adjusted.   Therapy recommendations:  pending   Disposition:  pending   Hypertension  Stable . BP goal normotensive  Hyperlipidemia  Home meds:  zocor 20  Changed to lipitor 40 on arrival  LDL 86, goal < 70  Continue statin at discharge  Other Stroke Risk Factors  Advanced age  Former Cigarette smoker  ETOH use, advised to drink no more than 1 drink(s) a day  Other Active Problems  Hx asthma  GERD  Chronic LBP  Hospital day # 0  Shannon Main, MSN, APRN, ANVP-BC, AGPCNP-BC Advanced Practice Stroke Nurse [facility] See Amion for Schedule & Pager information 05/04/2018 11:22 AM  I have personally obtained history,examined this patient, reviewed notes, independently viewed imaging studies, participated in medical decision making and plan of care.ROS completed by me personally and pertinent positives fully documented  I have made any additions or clarifications directly  to the above note. Agree with note above. She presented with sudden onset of left upper extremity weakness and numbness and has obtained near complete recovery but MRI shows no definite stroke. Likely small vessel disease TIAs. Continue ongoing stroke workup and dual antiplatelet therapy for 3 weeks followed by Plavix alone. Greater than 50% time during this 35 minute visit was spent on counseling and coordination of care about her TIA discussion about stroke prevention and treatment and answering questions. Delia Heady, MD Medical Director [facility] Stroke Center Pager:  [phone] 05/04/2018 4:14 PM  To contact Stroke Continuity provider, please refer to [email or web address]. After hours, contact General Neurology

## 2018-05-04 NOTE — Consult Note (Signed)
Neurology Consultation  Reason for Consult: Code Stroke Referring Physician: Dr Bebe Shaggy  CC: dizziness, left arm weakness  History is obtained from: patient, chart  HPI: Shannon [name] is a 68 y.o. female PMH of asthma, chronic back pain, HTN, HLD who was in her usual state of health when she went to bed at 2 AM and woke up later in the morning and had difficulty using the left arm. Her son was with him in the house and reports that his mother was not able to have a strong grip on the left hand and also noted to lift the left arm.  EMS was called.  Upon arrival, she reported some dizziness EMS and they also noted left-sided weakness.  The left-sided weakness started to improve on the way to the hospital.  On my initial evaluation, her only deficits were decreased sensation on the left side. NIH stroke scale 1. Systolic blood pressure in the 190s on arrival and on scene. CT of the head unremarkable.  CT unremarkable for acute process. TPA not offered because of low nondisabling NIH stroke scale.  LKW: 0200 hours on 05/04/2018 tpa given?: no, low NIH, nondisabling symptoms Premorbid modified Rankin scale (mRS): 1  ROS: ROS was performed and is negative except as noted in the HPI.   Past Medical History:  Diagnosis Date  . Arthritis    "right knee" (05/26/2014)  . Asthma   . Chronic lower back pain   . GERD (gastroesophageal reflux disease)   . HLD (hyperlipidemia)   . Hypertension    Family History  Problem Relation Age of Onset  . Hypertension Mother   . Hypertension Father   . Hypertension Sister    Social History:   reports that she has quit smoking. Her smoking use included cigarettes. She has never used smokeless tobacco. She reports current alcohol use. She reports that she does not use drugs.    Medications  Current Facility-Administered Medications:  .  iopamidol (ISOVUE-370) 76 % injection, , , ,   Current Outpatient Medications:  .  albuterol (PROVENTIL  HFA;VENTOLIN HFA) 108 (90 BASE) MCG/ACT inhaler, Inhale 2 puffs into the lungs every 2 (two) hours as needed for wheezing or shortness of breath (cough)., Disp: 1 Inhaler, Rfl: 0 .  amLODipine (NORVASC) 10 MG tablet, Take 10 mg by mouth daily., Disp: , Rfl:  .  aspirin EC 81 MG tablet, Take 81 mg by mouth daily., Disp: , Rfl:  .  CALCIUM PO, Take 1 tablet by mouth daily., Disp: , Rfl:  .  Fluticasone-Salmeterol (ADVAIR DISKUS) 250-50 MCG/DOSE AEPB, Inhale 1 puff into the lungs 2 (two) times daily. (Patient not taking: Reported on 12/25/2014), Disp: 60 each, Rfl: 0 .  lovastatin (ALTOPREV) 40 MG 24 hr tablet, Take 80 mg by mouth at bedtime., Disp: , Rfl:  .  metoprolol tartrate (LOPRESSOR) 25 MG tablet, Take 25 mg by mouth 2 (two) times daily., Disp: , Rfl:  .  pantoprazole (PROTONIX) 40 MG tablet, Take 1 tablet (40 mg total) by mouth daily. Take 30 minutes before breakfast, Disp: 30 tablet, Rfl: 0 .  predniSONE (DELTASONE) 20 MG tablet, Take 3 tablets (60 mg total) by mouth daily., Disp: 12 tablet, Rfl: 0 .  SPIRIVA HANDIHALER 18 MCG inhalation capsule, INL 1 PUFF PO VIA HANDIHALER ONCE D, Disp: , Rfl: 3  Exam: Current vital signs: BP 126/68 (BP Location: Right Arm)   Pulse 85   Temp 97.8 F (36.6 C) (Oral)   Resp 18  Ht 5\' 6"  (1.676 m)   Wt 74 kg   SpO2 95%   BMI 26.33 kg/m  Vital signs in last 24 hours: Temp:  [97.8 F (36.6 C)] 97.8 F (36.6 C) (03/16 0626) Pulse Rate:  [85] 85 (03/16 0626) Resp:  [18] 18 (03/16 0626) BP: (126)/(68) 126/68 (03/16 0626) SpO2:  [95 %] 95 % (03/16 0626) Weight:  [74 kg] 74 kg (03/16 0628) GENERAL: Awake, alert in NAD HEENT: - Normocephalic and atraumatic, dry mm, no LN++, no Thyromegally LUNGS - Clear to auscultation bilaterally with no wheezes CV - S1S2 RRR, no m/r/g, equal pulses bilaterally. ABDOMEN - Soft, nontender, nondistended with normoactive BS Ext: warm, well perfused, intact peripheral pulses,no edema  NEURO:  Mental Status: AA&Ox3   Language: speech is not dysarthric.  Naming, repetition, fluency, and comprehension intact. Cranial Nerves: PERRL. EOMI, visual fields full, no facial asymmetry, facial sensation decreased to touch, hearing intact, tongue/uvula/soft palate midline, normal sternocleidomastoid and trapezius muscle strength. No evidence of tongue atrophy or fibrillations Motor: No vertical drift but mild left hemiparesis without vertical drift. Tone: is normal and bulk is normal Sensation-decreased to light touch on the left side. Coordination: FTN intact bilaterally, no ataxia in BLE. Gait- deferred  Labs I have reviewed labs in epic and the results pertinent to this consultation are:  CBC    Component Value Date/Time   WBC 6.3 12/25/2014 0316   RBC 4.16 12/25/2014 0316   HGB 12.4 12/25/2014 0316   HCT 37.6 12/25/2014 0316   PLT 272 12/25/2014 0316   MCV 90.4 12/25/2014 0316   MCH 29.8 12/25/2014 0316   MCHC 33.0 12/25/2014 0316   RDW 14.0 12/25/2014 0316   LYMPHSABS 1.9 12/25/2014 0316   MONOABS 0.5 12/25/2014 0316   EOSABS 0.9 (H) 12/25/2014 0316   BASOSABS 0.0 12/25/2014 0316   CMP     Component Value Date/Time   NA 140 12/25/2014 0316   K 3.4 (L) 12/25/2014 0316   CL 106 12/25/2014 0316   CO2 22 12/25/2014 0316   GLUCOSE 103 (H) 12/25/2014 0316   BUN 13 12/25/2014 0316   CREATININE 1.10 (H) 05/04/2018 0605   CALCIUM 9.2 12/25/2014 0316   PROT 6.6 05/26/2014 0512   ALBUMIN 3.5 05/26/2014 0512   AST 21 05/26/2014 0512   ALT 18 05/26/2014 0512   ALKPHOS 94 05/26/2014 0512   BILITOT 0.6 05/26/2014 0512   GFRNONAA >60 12/25/2014 0316   GFRAA >60 12/25/2014 0316  Troponin 0 0.14  Imaging I have reviewed the images obtained: CT-scan of the brain-no acute changes CTA head and neck with no LVO.  Assessment: 68 year old woman brought in as an acute code stroke for dizziness, left arm weakness which was rapidly improving. On my examination, she had mild left arm and leg weakness with  no facial involvement but had no vertical drift. The only deficit on my exam for the NIH stroke scale was decreased sensation on the left-NIH stroke scale 1 for which I did not offer IV TPA. She has no evidence of LVO on exam or CTA hence not a candidate for EVT. At this time differentials include hypertensive urgency versus subcortical lacunar stroke/TIA versus left-sided weakness from a cardiac cause  Impression: Evaluate for stroke/TIA Evaluate for cardiac etiology of left arm numbness Evaluate for hypertensive urgency  Recommendations: MRI of the brain without contrast Admit to hospitalist-telemetry Frequent neurochecks A1c Lipid panel Physical therapy Occupational Therapy Aspirin 325 Atorvastatin 80 Allow for permissive hypertension at this time until  imaging results become available. Management of troponinemia per primary team   Please page stroke NP/PA/MD (listed on AMION)  from 8am-4 pm as this patient will be followed by the stroke team at this point.  -- Milon Dikes, MD [facility] Pager: [phone] If 7pm to 7am, please call on call as listed on AMION.

## 2018-05-04 NOTE — ED Notes (Signed)
Patient transported to MRI 

## 2018-05-04 NOTE — Plan of Care (Signed)

## 2018-05-04 NOTE — ED Triage Notes (Signed)
BIB [facility] with c/o left sided weakness. Code Stroke Called. LKW: 0200.

## 2018-05-04 NOTE — ED Provider Notes (Signed)
[facility] EMERGENCY DEPARTMENT Provider Note   CSN: [id number] Arrival date & time: 05/04/18  9147  An emergency department physician performed an initial assessment on this suspected stroke patient at 0555.  History   Chief Complaint Chief Complaint  Patient presents with   Code Stroke    HPI Shannon [name] is a 68 y.o. female with a past medical history of asthma, HLD, HTN, GERD who presents to ED for evaluation of left-sided arm and leg weakness that began 4hrs prior to arrival.  Symptoms began when she woke up and had difficulty using her left arm.  She notes that she has had intermittent generalized headaches for the past 3 days for which she has been taking aspirin for.  She denies any recent head injuries or falls.  She states that her weakness began to improve while she was in route to the ED.  She denies any chest pain, shortness of breath changes from baseline, vision changes, vomiting, abdominal pain, fever, URI symptoms.     HPI  Past Medical History:  Diagnosis Date   Arthritis    "right knee" (05/26/2014)   Asthma    Chronic lower back pain    GERD (gastroesophageal reflux disease)    HLD (hyperlipidemia)    Hypertension     Patient Active Problem List   Diagnosis Date Noted   Chest pain 05/26/2014   AKI (acute kidney injury) (HCC) 05/26/2014   Pain in the chest 05/26/2014   Diarrhea 05/26/2014   Alcohol intoxication (HCC)    Hypokalemia    G E R D 07/02/2006   Dysphagia 07/02/2006   HYPERCHOLESTEROLEMIA 04/17/2006   HYPERTENSION, BENIGN SYSTEMIC 04/17/2006   Allergic rhinitis 04/17/2006    Past Surgical History:  Procedure Laterality Date   ESOPHAGOGASTRODUODENOSCOPY N/A 05/27/2014   Procedure: ESOPHAGOGASTRODUODENOSCOPY (EGD);  Surgeon: Jeani Hawking, MD;  Location: [facility] ENDOSCOPY;  Service: Endoscopy;  Laterality: N/A;   FRACTURE SURGERY     PATELLA FRACTURE SURGERY Right ~ 2009   TONSILLECTOMY  ~ 1970    VAGINAL HYSTERECTOMY  1980's     OB History   No obstetric history on file.      Home Medications    Prior to Admission medications   Medication Sig Start Date End Date Taking? Authorizing Provider  albuterol (PROVENTIL HFA;VENTOLIN HFA) 108 (90 BASE) MCG/ACT inhaler Inhale 2 puffs into the lungs every 2 (two) hours as needed for wheezing or shortness of breath (cough). 09/21/12  Yes Ivonne Andrew, PA-C  aspirin EC 81 MG tablet Take 81 mg by mouth daily.   Yes [provider]  cholecalciferol (VITAMIN D3) 25 MCG (1000 UT) tablet Take 2,000 Units by mouth daily.   Yes [provider]  cloNIDine (CATAPRES) 0.2 MG tablet Take 0.2 mg by mouth 2 (two) times daily.   Yes [provider]  desloratadine (CLARINEX) 5 MG tablet Take 5 mg by mouth daily.   Yes [provider]  FLUoxetine (PROZAC) 10 MG capsule Take 10 mg by mouth daily.   Yes [provider]  fluticasone (FLONASE) 50 MCG/ACT nasal spray Place 1 spray into both nostrils daily.   Yes [provider]  Fluticasone-Salmeterol (ADVAIR) 500-50 MCG/DOSE AEPB Inhale 1 puff into the lungs 2 (two) times daily.   Yes [provider]  gabapentin (NEURONTIN) 600 MG tablet Take 600 mg by mouth at bedtime.   Yes [provider]  metoprolol tartrate (LOPRESSOR) 25 MG tablet Take 25 mg by mouth 2 (two)  times daily.   Yes [provider]  simvastatin (ZOCOR) 20 MG tablet Take 20 mg by mouth daily.   Yes [provider]  Fluticasone-Salmeterol (ADVAIR DISKUS) 250-50 MCG/DOSE AEPB Inhale 1 puff into the lungs 2 (two) times daily. Patient not taking: Reported on 12/25/2014 09/21/12   Ivonne Andrew, PA-C  pantoprazole (PROTONIX) 40 MG tablet Take 1 tablet (40 mg total) by mouth daily. Take 30 minutes before breakfast Patient not taking: Reported on 05/04/2018 05/27/14   Maretta Bees, MD  predniSONE (DELTASONE) 20 MG tablet Take 3 tablets (60 mg total) by mouth  daily. Patient not taking: Reported on 05/04/2018 12/25/14   Tomasita Crumble, MD    Family History Family History  Problem Relation Age of Onset   Hypertension Mother    Hypertension Father    Hypertension Sister     Social History Social History   Tobacco Use   Smoking status: Former Smoker    Types: Cigarettes   Smokeless tobacco: Never Used  Substance Use Topics   Alcohol use: Yes    Alcohol/week: 0.0 standard drinks   Drug use: No     Allergies   Patient has no known allergies.   Review of Systems Review of Systems  Constitutional: Negative for appetite change, chills and fever.  HENT: Negative for ear pain, rhinorrhea, sneezing and sore throat.   Eyes: Negative for photophobia and visual disturbance.  Respiratory: Negative for cough, chest tightness, shortness of breath and wheezing.   Cardiovascular: Negative for chest pain and palpitations.  Gastrointestinal: Negative for abdominal pain, blood in stool, constipation, diarrhea, nausea and vomiting.  Genitourinary: Negative for dysuria, hematuria and urgency.  Musculoskeletal: Negative for myalgias.  Skin: Negative for rash.  Neurological: Positive for weakness, numbness and headaches. Negative for dizziness and light-headedness.     Physical Exam Updated Vital Signs BP 125/60    Pulse 79    Temp 97.8 F (36.6 C) (Oral)    Resp 14    Ht 5\' 6"  (1.676 m)    Wt 74 kg    SpO2 100%    BMI 26.33 kg/m   Physical Exam Vitals signs and nursing note reviewed.  Constitutional:      General: She is not in acute distress.    Appearance: She is well-developed.  HENT:     Head: Normocephalic and atraumatic.     Nose: Nose normal.  Eyes:     General: No scleral icterus.       Right eye: No discharge.        Left eye: No discharge.     Extraocular Movements: Extraocular movements intact.     Conjunctiva/sclera: Conjunctivae normal.     Pupils: Pupils are equal, round, and reactive to light.  Neck:      Musculoskeletal: Normal range of motion and neck supple.  Cardiovascular:     Rate and Rhythm: Normal rate and regular rhythm.     Heart sounds: Normal heart sounds. No murmur. No friction rub. No gallop.   Pulmonary:     Effort: Pulmonary effort is normal. No respiratory distress.     Breath sounds: Normal breath sounds.  Abdominal:     General: Bowel sounds are normal. There is no distension.     Palpations: Abdomen is soft.     Tenderness: There is no abdominal tenderness. There is no guarding.  Musculoskeletal: Normal range of motion.  Skin:    General: Skin is warm and dry.     Findings: No  rash.  Neurological:     Mental Status: She is alert.     Motor: No abnormal muscle tone.     Coordination: Coordination normal.     Comments: Strength 4/5 of left upper extremity.  No facial asymmetry noted.  No pronator drift noted. Pupils reactive. Cranial nerves appear grossly intact.  Decreased sensation noted to left upper extremity.      ED Treatments / Results  Labs (all labs ordered are listed, but only abnormal results are displayed) Labs Reviewed  ETHANOL - Abnormal; Notable for the following components:      Result Value   Alcohol, Ethyl (B) 157 (*)    All other components within normal limits  COMPREHENSIVE METABOLIC PANEL - Abnormal; Notable for the following components:   Chloride 113 (*)    CO2 17 (*)    Creatinine, Ser 1.07 (*)    Calcium 8.6 (*)    GFR calc non Af Amer 53 (*)    All other components within normal limits  URINALYSIS, ROUTINE W REFLEX MICROSCOPIC - Abnormal; Notable for the following components:   Color, Urine STRAW (*)    Specific Gravity, Urine 1.031 (*)    All other components within normal limits  I-STAT CREATININE, ED - Abnormal; Notable for the following components:   Creatinine, Ser 1.10 (*)    All other components within normal limits  I-STAT TROPONIN, ED - Abnormal; Notable for the following components:   Troponin i, poc 0.14 (*)    All  other components within normal limits  PROTIME-INR  APTT  CBC  DIFFERENTIAL  TROPONIN I  RAPID URINE DRUG SCREEN, HOSP PERFORMED  CBG MONITORING, ED    EKG EKG Interpretation  Date/Time:  Monday May 04 2018 06:42:21 EDT Ventricular Rate:  78 PR Interval:    QRS Duration: 96 QT Interval:  414 QTC Calculation: 472 R Axis:   160 Text Interpretation:  Accelerated junctional rhythm Right ventricular hypertrophy Anterolateral infarct, age indeterminate Abnormal T, consider ischemia, lateral leads Abnormal ekg Confirmed by Zadie Rhine ([id number]) on 05/04/2018 6:48:42 AM   Radiology Ct Angio Head W Or Wo Contrast  Result Date: 05/04/2018 CLINICAL DATA:  Weakness, left-sided, with dizziness EXAM: CT ANGIOGRAPHY HEAD AND NECK TECHNIQUE: Multidetector CT imaging of the head and neck was performed using the standard protocol during bolus administration of intravenous contrast. Multiplanar CT image reconstructions and MIPs were obtained to evaluate the vascular anatomy. Carotid stenosis measurements (when applicable) are obtained utilizing NASCET criteria, using the distal internal carotid diameter as the denominator. CONTRAST:  69mL ISOVUE-370 IOPAMIDOL (ISOVUE-370) INJECTION 76% COMPARISON:  Noncontrast head CT earlier today FINDINGS: CTA NECK FINDINGS Aortic arch: Atherosclerosis.  No acute finding.  3 vessel branching Right carotid system: Mild narrowing at the ICA bulb due to atherosclerotic plaque. No ulceration or evident dissection. Beaded appearance of the ICA. Left carotid system: Atheromatous wall thickening of the common carotid and proximal ICA without flow limiting stenosis. ICA beading without ulceration or visible dissection. Vertebral arteries: No proximal subclavian stenosis. Both vertebral arteries are widely patent to the dura. No superimposed beading or dissection is seen Skeleton: Spondylosis.  No acute or aggressive finding Other neck: Large bilateral submandibular gland  calculi. No superimposed acute inflammation. Upper chest: Emphysema Review of the MIP images confirms the above findings CTA HEAD FINDINGS Anterior circulation: Mild atherosclerotic plaque along the carotid siphons. No branch occlusion or flow limiting stenosis. Negative for aneurysm Posterior circulation: Widely patent vertebral and basilar arteries. Hypoplastic P1 segments. No  branch occlusion. Negative for aneurysm. Venous sinuses: Patent Anatomic variants: As above Delayed phase: Not obtained in the emergent setting Review of the MIP images confirms the above findings IMPRESSION: 1. No emergent finding. 2. Cervical and intracranial atherosclerosis without flow limiting stenosis of major vessels. 3. 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 4. Incidental large submandibular gland calculi. 5. Emphysema. Electronically Signed   By: Marnee Spring M.D.   On: 05/04/2018 06:24   Dg Chest 2 View  Result Date: 05/04/2018 CLINICAL DATA:  Stroke-like symptoms EXAM: CHEST - 2 VIEW COMPARISON:  12/25/2014 FINDINGS: Apical emphysematous change. Hyperinflation with diaphragm flattening. There is no edema, consolidation, effusion, or pneumothorax. Normal heart size and mediastinal contours. Artifact from EKG leads. IMPRESSION: 1. No acute finding. 2. Emphysema. Electronically Signed   By: Marnee Spring M.D.   On: 05/04/2018 07:15   Ct Angio Neck W Or Wo Contrast  Result Date: 05/04/2018 CLINICAL DATA:  Weakness, left-sided, with dizziness EXAM: CT ANGIOGRAPHY HEAD AND NECK TECHNIQUE: Multidetector CT imaging of the head and neck was performed using the standard protocol during bolus administration of intravenous contrast. Multiplanar CT image reconstructions and MIPs were obtained to evaluate the vascular anatomy. Carotid stenosis measurements (when applicable) are obtained utilizing NASCET criteria, using the distal internal carotid diameter as the denominator. CONTRAST:  57mL ISOVUE-370  IOPAMIDOL (ISOVUE-370) INJECTION 76% COMPARISON:  Noncontrast head CT earlier today FINDINGS: CTA NECK FINDINGS Aortic arch: Atherosclerosis.  No acute finding.  3 vessel branching Right carotid system: Mild narrowing at the ICA bulb due to atherosclerotic plaque. No ulceration or evident dissection. Beaded appearance of the ICA. Left carotid system: Atheromatous wall thickening of the common carotid and proximal ICA without flow limiting stenosis. ICA beading without ulceration or visible dissection. Vertebral arteries: No proximal subclavian stenosis. Both vertebral arteries are widely patent to the dura. No superimposed beading or dissection is seen Skeleton: Spondylosis.  No acute or aggressive finding Other neck: Large bilateral submandibular gland calculi. No superimposed acute inflammation. Upper chest: Emphysema Review of the MIP images confirms the above findings CTA HEAD FINDINGS Anterior circulation: Mild atherosclerotic plaque along the carotid siphons. No branch occlusion or flow limiting stenosis. Negative for aneurysm Posterior circulation: Widely patent vertebral and basilar arteries. Hypoplastic P1 segments. No branch occlusion. Negative for aneurysm. Venous sinuses: Patent Anatomic variants: As above Delayed phase: Not obtained in the emergent setting Review of the MIP images confirms the above findings IMPRESSION: 1. No emergent finding. 2. Cervical and intracranial atherosclerosis without flow limiting stenosis of major vessels. 3. 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 4. Incidental large submandibular gland calculi. 5. Emphysema. Electronically Signed   By: Marnee Spring M.D.   On: 05/04/2018 06:24   Ct Head Code Stroke Wo Contrast  Result Date: 05/04/2018 CLINICAL DATA:  Code stroke.  Left-sided weakness EXAM: CT HEAD WITHOUT CONTRAST TECHNIQUE: Contiguous axial images were obtained from the base of the skull through the vertex without intravenous contrast.  COMPARISON:  None. FINDINGS: Brain: No evidence of acute infarction, hemorrhage, hydrocephalus, extra-axial collection or mass effect. 9 mm calcification along the right cerebral convexity could reflect a tiny meningioma. Vascular: No hyperdense vessel Skull: Negative Sinuses/Orbits: Negative Other: These results were communicated to Dr. Wilford Corner at 6:09 amon 3/16/2020by text page via the [facility] messaging system. ASPECTS [facility] Stroke Program Early CT Score) - Ganglionic level infarction (caudate, lentiform nuclei, internal capsule, insula, M1-M3 cortex): 7 - Supraganglionic infarction (M4-M6 cortex): 3 Total score (0-10  with 10 being normal): 10 IMPRESSION: No acute finding.ASPECTS is 10. Electronically Signed   By: Marnee Spring M.D.   On: 05/04/2018 06:11    Procedures Procedures (including critical care time)  CRITICAL CARE Performed by: Dietrich Pates   Total critical care time: 33 minutes  Critical care time was exclusive of separately billable procedures and treating other patients.  Critical care was necessary to treat or prevent imminent or life-threatening deterioration.  Critical care was time spent personally by me on the following activities: development of treatment plan with patient and/or surrogate as well as nursing, discussions with consultants, evaluation of patient's response to treatment, examination of patient, obtaining history from patient or surrogate, ordering and performing treatments and interventions, ordering and review of laboratory studies, ordering and review of radiographic studies, pulse oximetry and re-evaluation of patient's condition.   Medications Ordered in ED Medications  iopamidol (ISOVUE-370) 76 % injection 75 mL (75 mLs Intravenous Contrast Given 05/04/18 0610)  LORazepam (ATIVAN) tablet 0.5 mg (0.5 mg Oral Given 05/04/18 0807)     Initial Impression / Assessment and Plan / ED Course  I have reviewed the triage vital signs and the nursing  notes.  Pertinent labs & imaging results that were available during my care of the patient were reviewed by me and considered in my medical decision making (see chart for details).  Clinical Course as of May 03 841  Mon May 04, 2018  0600 Alcohol, Ethyl (B)(!): 157 [HK]  [id number] Patient denies chest pain.  Troponin i, poc(!!): 0.14 [HK]  [id number] Per Dr. Wilford Corner: Recommendations: MRI of the brain without contrast Admit to hospitalist-telemetry Frequent neurochecks A1c Lipid panel Physical therapy Occupational Therapy Aspirin 325 Atorvastatin 80 Allow for permissive hypertension at this time until imaging results become available. Management of troponinemia per primary team   [HK]  0802 Troponin I: <0.03 [HK]    Clinical Course User Index [HK] Dietrich Pates, PA-C       68 year old female with a past medical history of hypertension, hyperlipidemia, GERD who presents to ED for left-sided weakness that began 4 hours prior to arrival.  She was transported to the ED as a code stroke.  She was evaluated by Dr. Jerrell Belfast of neurology and CT, CTA of the head and neck were completed.  She does note that her symptoms have gradually improved although still evident while being transported to the ED.  She denies chest pain, shortness of breath.  Lab work shows elevated i-STAT troponin of 0.14, abnormal EKG with changes from priors.  CT, CTA of the head and neck are negative for acute abnormality.  Chest x-ray is negative.  Alcohol level elevated at 157.  She notes that she will only drink alcohol on the weekend and did drink 3 mixed drinks yesterday.  She denies daily alcohol use.  Denies any recent injuries or falls.  Neurology recommends MRI of the brain with and without contrast, hospitalist admission.  Will order MRI, awaiting results of UA, UDS, repeat troponin.  Her vital signs are reassuring.  8:02 AM Patient's lab troponin is negative.  8:43 AM Urinalysis is unremarkable. Will admit for further  workup and management for stroke like symptoms. Patient discussed with and seen by my attending, Dr. Bebe Shaggy.     Portions of this note were generated with Scientist, clinical (histocompatibility and immunogenetics). Dictation errors may occur despite best attempts at proofreading.  Final Clinical Impressions(s) / ED Diagnoses   Final diagnoses:  Left-sided weakness    ED Discharge Orders  None       Dietrich Pates, PA-C 05/04/18 1610    Zadie Rhine, MD 05/05/18 1347

## 2018-05-04 NOTE — ED Notes (Signed)
Patient transported to X-ray 

## 2018-05-04 NOTE — Progress Notes (Signed)
Physical Therapy Evaluation Patient Details Name: Shannon [name] MRN: [id number] DOB: 28-Oct-1950 Today's Date: 05/04/2018   History of Present Illness  Patient is 68 y/o female presenting to hospital with L hand numbness and weakness secondary to TIA. MRI and CT negative for acute stroke. Chest x-ray revealed emphysema. PMH includes asthma, GERD, HTN, HLD, and chronic low back pain.  Clinical Impression  Patient admitted to hospital secondary to problems above and with deficits below. Patient required min guard to stand x2 with and without AD. Required use of RW for improved stability in standing. Ambulated with supervision and use of RW. Educated about use of RW at home following d/c. Given functional mobility deficits, recommending HHPT following d/c. Patient has necessary assistance/supervision at home. Patient will benefit from acute physical therapy to maximize independence and safety with functional mobility.     Follow Up Recommendations Home health PT;Supervision for mobility/OOB    Equipment Recommendations  None recommended by PT    Recommendations for Other Services       Precautions / Restrictions Precautions Precautions: None Restrictions Weight Bearing Restrictions: No      Mobility  Bed Mobility Overal bed mobility: Modified Independent             General bed mobility comments: Patient at modI level for all bed mobility  Transfers Overall transfer level: Needs assistance Equipment used: None;Rolling walker (2 wheeled) Transfers: Sit to/from Stand Sit to Stand: Min guard         General transfer comment: Patient required min guard to stand x2. Patient with unsteadiness upon standing on first trial with no AD therefore returned to sitting. Second stand required use of RW with improved stability. Verbal cues for safe hand placement when using RW.  Ambulation/Gait Ambulation/Gait assistance: Supervision Gait Distance (Feet): 125 Feet Assistive  device: Rolling walker (2 wheeled) Gait Pattern/deviations: Step-through pattern;Decreased stride length Gait velocity: decreased Gait velocity interpretation: 1.31 - 2.62 ft/sec, indicative of limited community ambulator General Gait Details: Patient ambulated with supervision and use of RW for safety. No LOB noted. Verbal cues for safe proximity to RW. Educated about use of RW at home following d/c for improved stability.  Stairs            Wheelchair Mobility    Modified Rankin (Stroke Patients Only)       Balance Overall balance assessment: Needs assistance Sitting-balance support: No upper extremity supported;Feet supported Sitting balance-Leahy Scale: Good     Standing balance support: Bilateral upper extremity supported Standing balance-Leahy Scale: Poor Standing balance comment: reliant on BUE support to maintain standing balance                             Pertinent Vitals/Pain Pain Assessment: Faces Faces Pain Scale: No hurt    Home Living Family/patient expects to be discharged to:: Private residence Living Arrangements: Children Available Help at Discharge: Family;Available 24 hours/day Type of Home: House Home Access: Stairs to enter Entrance Stairs-Rails: None Entrance Stairs-Number of Steps: 4 Home Layout: One level Home Equipment: Walker - 2 wheels      Prior Function Level of Independence: Independent               Hand Dominance        Extremity/Trunk Assessment   Upper Extremity Assessment Upper Extremity Assessment: Defer to OT evaluation;LUE deficits/detail LUE Deficits / Details: Decreased grip strength in LUE. Decreased sensation along L 3rd digit.  LUE Sensation:  decreased light touch    Lower Extremity Assessment Lower Extremity Assessment: Generalized weakness    Cervical / Trunk Assessment Cervical / Trunk Assessment: Normal  Communication   Communication: No difficulties  Cognition Arousal/Alertness:  Awake/alert Behavior During Therapy: WFL for tasks assessed/performed Overall Cognitive Status: Within Functional Limits for tasks assessed                                        General Comments General comments (skin integrity, edema, etc.): Patient son in room during session    Exercises     Assessment/Plan    PT Assessment Patient needs continued PT services  PT Problem List Decreased strength;Decreased activity tolerance;Decreased balance;Decreased mobility;Decreased knowledge of use of DME;Impaired sensation       PT Treatment Interventions DME instruction;Gait training;Stair training;Functional mobility training;Therapeutic activities;Therapeutic exercise;Balance training;Neuromuscular re-education;Patient/family education    PT Goals (Current goals can be found in the Care Plan section)  Acute Rehab PT Goals Patient Stated Goal: go home PT Goal Formulation: With patient Time For Goal Achievement: 05/18/18 Potential to Achieve Goals: Good    Frequency Min 3X/week   Barriers to discharge        Co-evaluation               AM-PAC PT "6 Clicks" Mobility  Outcome Measure Help needed turning from your back to your side while in a flat bed without using bedrails?: None Help needed moving from lying on your back to sitting on the side of a flat bed without using bedrails?: None Help needed moving to and from a bed to a chair (including a wheelchair)?: A Little Help needed standing up from a chair using your arms (e.g., wheelchair or bedside chair)?: A Little Help needed to walk in hospital room?: A Little Help needed climbing 3-5 steps with a railing? : A Lot 6 Click Score: 19    End of Session Equipment Utilized During Treatment: Gait belt Activity Tolerance: Patient tolerated treatment well Patient left: in bed;with call bell/phone within reach;with bed alarm set;with family/visitor present Nurse Communication: Mobility status PT Visit  Diagnosis: Unsteadiness on feet (R26.81);Muscle weakness (generalized) (M62.81);Other abnormalities of gait and mobility (R26.89)    Time: 1761-6073 PT Time Calculation (min) (ACUTE ONLY): 19 min   Charges:   PT Evaluation $PT Eval Low Complexity: 1 Low          Vanessa Ralphs, SPT  Vanessa Ralphs 05/04/2018, 4:18 PM

## 2018-05-04 NOTE — ED Provider Notes (Signed)
Patient seen/examined in the Emergency Department in conjunction with Advanced Practice Provider [facility] Patient left-sided weakness.  She was transported as a code stroke.  She denies any chest pain. Exam : Alert, no acute distress.  No obvious leg drift to my exam.  No murmurs noted Plan: Patient reportedly had left arm and left leg weakness that are improving.  However she does have abnormal EKG with mildly elevated troponin.  She flatly denies chest pain, and there is no obvious electrolyte abnormalities. Patient will need to be admitted for further evaluation    Zadie Rhine, MD 05/04/18 0710

## 2018-05-04 NOTE — H&P (Signed)
Date: 05/04/2018               Patient Name:  Shannon [name] MRN: [id number]  DOB: 04/22/1950 Age / Sex: 68 y.o., female   PCP: [facility], [facility]         Medical Service: Internal Medicine Teaching Service         Attending Physician: Dr. Earl Lagos, MD    First Contact: Dr. Chesley Mires Pager: [phone]  Second Contact: Dr. Crista Elliot Pager: [phone]       After Hours (After 5p/  First Contact Pager: [phone]  weekends / holidays): Second Contact Pager: [phone]   Chief Complaint: Left-sided weakness.  History of Present Illness: Shannon [name] is a 68 y.o. female with a past medical history of asthma, HLD, HTN, GERD who presents to ED with complaint of sudden onset left-sided weakness started around 4 AM.  Patient was up the whole night watching TV, dozing off intermittently, got up to get ready for bed around 4 AM and noticed left-sided weakness more in his left upper extremity.  She was having some difficulty walking but denies any fall.  Is having some headaches few days ago but denies any headache today.  She denies any blurry vision, difficulty talking or swallowing.  She denies any chest pain, palpitations or shortness of breath.  Nausea, vomiting, abdominal pain, diarrhea, recent illnesses, fever or chills.  No urinary symptoms. Her symptoms are improving since then.  ED course.  She was hemodynamically stable, mild left hemiparesis, labs within normal limit except initial I stat troponin positive,repeat negative, EKG with anterolateral infarct, no chest pain, elevated ethanol at 157, CT head, CTA head and neck and MRI all negative for any acute infarct.  She was admitted for completion of stroke work-up.  Meds:  Current Meds  Medication Sig  . albuterol (PROVENTIL HFA;VENTOLIN HFA) 108 (90 BASE) MCG/ACT inhaler Inhale 2 puffs into the lungs every 2 (two) hours as needed for wheezing or shortness of breath (cough).  Marland Kitchen aspirin EC 81 MG tablet  Take 81 mg by mouth daily.  . cholecalciferol (VITAMIN D3) 25 MCG (1000 UT) tablet Take 2,000 Units by mouth daily.  . cloNIDine (CATAPRES) 0.2 MG tablet Take 0.2 mg by mouth 2 (two) times daily.  Marland Kitchen desloratadine (CLARINEX) 5 MG tablet Take 5 mg by mouth daily.  Marland Kitchen FLUoxetine (PROZAC) 10 MG capsule Take 10 mg by mouth daily.  . fluticasone (FLONASE) 50 MCG/ACT nasal spray Place 1 spray into both nostrils daily.  . Fluticasone-Salmeterol (ADVAIR) 500-50 MCG/DOSE AEPB Inhale 1 puff into the lungs 2 (two) times daily.  Marland Kitchen gabapentin (NEURONTIN) 600 MG tablet Take 600 mg by mouth at bedtime.  . metoprolol tartrate (LOPRESSOR) 25 MG tablet Take 25 mg by mouth 2 (two) times daily.  . simvastatin (ZOCOR) 20 MG tablet Take 20 mg by mouth daily.     Allergies: Allergies as of 05/04/2018  . (No Known Allergies)   Past Medical History:  Diagnosis Date  . Arthritis    "right knee" (05/26/2014)  . Asthma   . Chronic lower back pain   . GERD (gastroesophageal reflux disease)   . HLD (hyperlipidemia)   . Hypertension     Family History: Daughter died of breast cancer at age 63.  Father with prostate cancer at age of 13.  Social History: Lives with her son.  Independent in all her ADLs.  Quit smoking 20 years ago, socially drinks on weekends, denies any  illicit drug use.  Review of Systems: A complete ROS was negative except as per HPI.   Physical Exam: Blood pressure 125/60, pulse 79, temperature 97.8 F (36.6 C), temperature source Oral, resp. rate 14, height 5\' 6"  (1.676 m), weight 74 kg, SpO2 100 %. Vitals:   05/04/18 0645 05/04/18 0715 05/04/18 0730 05/04/18 0745  BP: 136/67 130/81 133/67 125/60  Pulse: 79     Resp: 20 17 (!) 21 14  Temp:      TempSrc:      SpO2: 100%     Weight:      Height:       General: Vital signs reviewed.  Patient is well-developed and well-nourished, in no acute distress and cooperative with exam.  Head: Normocephalic and atraumatic. Eyes: EOMI,  conjunctivae normal, no scleral icterus.  Neck: Supple, trachea midline, normal ROM, no JVD, masses, thyromegaly, or carotid bruit present.  Cardiovascular: RRR, S1 normal, S2 normal, no murmurs, gallops, or rubs. Pulmonary/Chest: Clear to auscultation bilaterally, no wheezes, rales, or rhonchi. Abdominal: Soft, non-tender, non-distended, BS +, no masses, organomegaly, or guarding present.  Extremities: No lower extremity edema bilaterally,  pulses symmetric and intact bilaterally. No cyanosis or clubbing. Neurological: A&O x3, cranial nerve II-XII are grossly intact, strength 4/5 in left hand grip, rest 5/5, sensory intact to light touch bilaterally.  Skin: Warm, dry and intact. No rashes or erythema. Psychiatric: Normal mood and affect. speech and behavior is normal. Cognition and memory are normal.  EKG: personally reviewed my interpretation is sinus rhythm with inverted QRS and T waves in anterolateral leads, remained same on repeat EKG, new from her previous EKG done in 2016.  CXR: personally reviewed my interpretation is hyperinflated lungs without any acute abnormality.  Assessment & Plan by Problem: Shannon [name] is a 68 y.o. female with a past medical history of asthma, HLD, HTN, GERD who presents to ED with complaint of sudden onset left-sided weakness started around 4 AM. Active Problems:   TIA (transient ischemic attack) MRI without any acute infarct but did show chronic small vessel ischemic changes in pons and thalami.  CTA with cervical and intracranial atherosclerosis without any flow limitation.  Improving neurologic exam. Passed swallow evaluation. -Admitted for completion of stroke work-up. -A1c, lipid profile and echocardiogram ordered. -PT/OT evaluation -Aspirin. -Lipitor 40 mg daily.  Initial elevated i-STAT troponin.  No chest pain or shortness of breath. Repeat troponin negative.  EKG with QRS and T wave inversion in anterolateral leads which is new as compared  to her previous EKG of 2016.  Denies any cardiac event. -Repeat troponin one more time.  Hypertension.  Currently normotensive, holding home antihypertensives. She was on clonidine and metoprolol at home.  She did not know why she was placed on this regimen and denies any ACEI allergies.  With her A1c being 6.4, although no protein in urine she might get benefit with ACE inhibitor.  DVT prophylaxis.  Lovenox CODE STATUS.  Full Diet.  Heart healthy  Dispo: Admit patient to Observation with expected length of stay less than 2 midnights.  SignedArnetha Courser, MD 05/04/2018, 10:30 AM  Pager: [phone]

## 2018-05-04 NOTE — Progress Notes (Signed)
  Echocardiogram 2D Echocardiogram has been performed.  Celene Skeen 05/04/2018, 1:25 PM

## 2018-05-05 DIAGNOSIS — J45909 Unspecified asthma, uncomplicated: Secondary | ICD-10-CM

## 2018-05-05 DIAGNOSIS — K219 Gastro-esophageal reflux disease without esophagitis: Secondary | ICD-10-CM

## 2018-05-05 DIAGNOSIS — E785 Hyperlipidemia, unspecified: Secondary | ICD-10-CM | POA: Diagnosis not present

## 2018-05-05 DIAGNOSIS — Z7982 Long term (current) use of aspirin: Secondary | ICD-10-CM

## 2018-05-05 DIAGNOSIS — I10 Essential (primary) hypertension: Secondary | ICD-10-CM | POA: Diagnosis not present

## 2018-05-05 DIAGNOSIS — Z7902 Long term (current) use of antithrombotics/antiplatelets: Secondary | ICD-10-CM

## 2018-05-05 DIAGNOSIS — R7303 Prediabetes: Secondary | ICD-10-CM

## 2018-05-05 DIAGNOSIS — Z79899 Other long term (current) drug therapy: Secondary | ICD-10-CM

## 2018-05-05 DIAGNOSIS — F419 Anxiety disorder, unspecified: Secondary | ICD-10-CM

## 2018-05-05 DIAGNOSIS — G459 Transient cerebral ischemic attack, unspecified: Secondary | ICD-10-CM | POA: Diagnosis not present

## 2018-05-05 LAB — HIV ANTIBODY (ROUTINE TESTING W REFLEX): HIV Screen 4th Generation wRfx: NONREACTIVE

## 2018-05-05 MED ORDER — ATORVASTATIN CALCIUM 80 MG PO TABS: 80.0000 mg | ORAL_TABLET | Freq: Every day | ORAL | Status: DC

## 2018-05-05 MED ORDER — ATORVASTATIN CALCIUM 40 MG PO TABS: 40.0000 mg | ORAL_TABLET | Freq: Every day | ORAL | 0 refills | Status: DC

## 2018-05-05 MED ORDER — ASPIRIN EC 81 MG PO TBEC: 81.0000 mg | DELAYED_RELEASE_TABLET | Freq: Every day | ORAL | 0 refills | Status: AC

## 2018-05-05 MED ORDER — CLOPIDOGREL BISULFATE 75 MG PO TABS: 75.0000 mg | ORAL_TABLET | Freq: Every day | ORAL | 0 refills | Status: DC

## 2018-05-05 MED ORDER — ATORVASTATIN CALCIUM 80 MG PO TABS: 80.0000 mg | ORAL_TABLET | Freq: Every day | ORAL | 2 refills | Status: AC

## 2018-05-05 NOTE — Progress Notes (Signed)
Physical Therapy Treatment Patient Details Name: Shannon [name] MRN: [id number] DOB: 1950-07-29 Today's Date: 05/05/2018    History of Present Illness Patient is 68 y/o female presenting to hospital with L hand numbness and weakness secondary to TIA. MRI and CT negative for acute stroke. Chest x-ray revealed emphysema. PMH includes asthma, GERD, HTN, HLD, and chronic low back pain.    PT Comments    Pt received in bed, willing to participate in therapy. Mod(I) to min guard with mobility. Attempted ambulation without AD today, assessed balance with light challenges (see details below). Pt able to manage stairs without rail, HHA. From a PT standpoint, pt will be safe to DC home with her son.  Continue to recommend HHPT to ensure pt is moving safely in the home. She will continue to benefit from skilled acute PT services in order to safely progress functional mobility for safe DC.    Follow Up Recommendations  Home health PT;Supervision for mobility/OOB     Equipment Recommendations  None recommended by PT    Recommendations for Other Services       Precautions / Restrictions Precautions Precautions: Other (comment) Precaution Comments: watch HR  Restrictions Weight Bearing Restrictions: No    Mobility  Bed Mobility Overal bed mobility: Modified Independent             General bed mobility comments: HOB elevated, no assistance needed  Transfers Overall transfer level: Needs assistance Equipment used: None Transfers: Sit to/from Stand Sit to Stand: Supervision         General transfer comment: Supervision for safety  Ambulation/Gait Ambulation/Gait assistance: Min guard     Gait Pattern/deviations: Step-through pattern;Decreased stride length Gait velocity: decreased   General Gait Details: Pt ambulated without RW in hallways with min guard after safely marching in place very steadily with no UE support; assessed balance with head turns, turns during  ambulation and no LOB. Pt reports she feels at baseline.   Stairs Stairs: Yes Stairs assistance: Min guard Stair Management: No rails;Step to pattern Number of Stairs: 2 General stair comments: Pt able to ascend 2 steps with HHA without difficulty, educated pt on need for son to help her up and down steps for the [facility]    Wheelchair Mobility    Modified Rankin (Stroke Patients Only)       Balance Overall balance assessment: Needs assistance Sitting-balance support: No upper extremity supported;Feet supported Sitting balance-Leahy Scale: Good     Standing balance support: No upper extremity supported;During functional activity Standing balance-Leahy Scale: Good Standing balance comment: min guard for safety; pt able to ambulate in hallways with no UE support, head turns, direction changes, turns without LOB             High level balance activites: Direction changes;Turns;Head turns              Cognition Arousal/Alertness: Awake/alert Behavior During Therapy: WFL for tasks assessed/performed Overall Cognitive Status: Within Functional Limits for tasks assessed                                        Exercises Other Exercises Other Exercises: Provided [facility] handout and reviewed with pt     General Comments General comments (skin integrity, edema, etc.): HR upto 140 during functional ADLs       Pertinent Vitals/Pain Pain Assessment: No/denies pain    Home Living Family/patient expects to be  discharged to:: Private residence Living Arrangements: Children Available Help at Discharge: Family;Available 24 hours/day Type of Home: House Home Access: Stairs to enter Entrance Stairs-Rails: None Home Layout: One level Home Equipment: Environmental consultant - 2 wheels;Shower seat      Prior Function Level of Independence: Independent      Comments: completes IADLs, does not drive or work    PT Goals (current goals can now be found in the care plan section)  Acute Rehab PT Goals Patient Stated Goal: go home PT Goal Formulation: With patient Time For Goal Achievement: 05/18/18 Potential to Achieve Goals: Good Progress towards PT goals: Progressing toward goals    Frequency    Min 3X/week      PT Plan Current plan remains appropriate    Co-evaluation              AM-PAC PT "6 Clicks" Mobility   Outcome Measure  Help needed turning from your back to your side while in a flat bed without using bedrails?: None Help needed moving from lying on your back to sitting on the side of a flat bed without using bedrails?: None Help needed moving to and from a bed to a chair (including a wheelchair)?: A Little Help needed standing up from a chair using your arms (e.g., wheelchair or bedside chair)?: A Little Help needed to walk in hospital room?: A Little Help needed climbing 3-5 steps with a railing? : A Little 6 Click Score: 20    End of Session Equipment Utilized During Treatment: Gait belt Activity Tolerance: Patient tolerated treatment well Patient left: in bed;with call bell/phone within reach;with bed alarm set   PT Visit Diagnosis: Unsteadiness on feet (R26.81);Muscle weakness (generalized) (M62.81);Other abnormalities of gait and mobility (R26.89)     Time:  -     Charges:                        Halina Andreas, SPT    Halina Andreas 05/05/2018, 11:11 AM

## 2018-05-05 NOTE — Progress Notes (Addendum)
STROKE TEAM PROGRESS NOTE   INTERVAL HISTORY Working with therapy. HH PT recommended. No OT needs for d/c home today.  Vitals:   05/05/18 0007 05/05/18 0500 05/05/18 0732 05/05/18 0832  BP: 137/62 137/70 (!) 147/72   Pulse: 80  89   Resp: 18 16 18    Temp: 98.7 F (37.1 C) 98.5 F (36.9 C) 99 F (37.2 C)   TempSrc: Oral Oral Oral   SpO2: 99% 99% 96% 100%  Weight:      Height:        CBC:  Recent Labs  Lab 05/04/18 0600 05/04/18 1030  WBC 4.4 5.8  NEUTROABS 2.0  --   HGB 12.8 12.8  HCT 41.5 40.6  MCV 97.6 96.0  PLT 240 246    Basic Metabolic Panel:  Recent Labs  Lab 05/04/18 0600 05/04/18 0605 05/04/18 1030  NA 140  --   --   K 3.6  --   --   CL 113*  --   --   CO2 17*  --   --   GLUCOSE 95  --   --   BUN 16  --   --   CREATININE 1.07* 1.10* 0.99  CALCIUM 8.6*  --   --    Lipid Panel:     Component Value Date/Time   CHOL 247 (H) 05/04/2018 1030   TRIG 95 05/04/2018 1030   HDL 77 05/04/2018 1030   CHOLHDL 3.2 05/04/2018 1030   VLDL 19 05/04/2018 1030   LDLCALC 151 (H) 05/04/2018 1030   HgbA1c:  Lab Results  Component Value Date   HGBA1C 6.4 (H) 05/04/2018   Urine Drug Screen:     Component Value Date/Time   LABOPIA NONE DETECTED 05/04/2018 0803   COCAINSCRNUR NONE DETECTED 05/04/2018 0803   LABBENZ NONE DETECTED 05/04/2018 0803   AMPHETMU NONE DETECTED 05/04/2018 0803   THCU NONE DETECTED 05/04/2018 0803   LABBARB NONE DETECTED 05/04/2018 0803    Alcohol Level     Component Value Date/Time   ETH 157 (H) 05/04/2018 0600    IMAGING Ct Angio Head W Or Wo Contrast  Result Date: 05/04/2018 CLINICAL DATA:  Weakness, left-sided, with dizziness EXAM: CT ANGIOGRAPHY HEAD AND NECK TECHNIQUE: Multidetector CT imaging of the head and neck was performed using the standard protocol during bolus administration of intravenous contrast. Multiplanar CT image reconstructions and MIPs were obtained to evaluate the vascular anatomy. Carotid stenosis  measurements (when applicable) are obtained utilizing NASCET criteria, using the distal internal carotid diameter as the denominator. CONTRAST:  55mL ISOVUE-370 IOPAMIDOL (ISOVUE-370) INJECTION 76% COMPARISON:  Noncontrast head CT earlier today FINDINGS: CTA NECK FINDINGS Aortic arch: Atherosclerosis.  No acute finding.  3 vessel branching Right carotid system: Mild narrowing at the ICA bulb due to atherosclerotic plaque. No ulceration or evident dissection. Beaded appearance of the ICA. Left carotid system: Atheromatous wall thickening of the common carotid and proximal ICA without flow limiting stenosis. ICA beading without ulceration or visible dissection. Vertebral arteries: No proximal subclavian stenosis. Both vertebral arteries are widely patent to the dura. No superimposed beading or dissection is seen Skeleton: Spondylosis.  No acute or aggressive finding Other neck: Large bilateral submandibular gland calculi. No superimposed acute inflammation. Upper chest: Emphysema Review of the MIP images confirms the above findings CTA HEAD FINDINGS Anterior circulation: Mild atherosclerotic plaque along the carotid siphons. No branch occlusion or flow limiting stenosis. Negative for aneurysm Posterior circulation: Widely patent vertebral and basilar arteries. Hypoplastic P1 segments. No branch  occlusion. Negative for aneurysm. Venous sinuses: Patent Anatomic variants: As above Delayed phase: Not obtained in the emergent setting Review of the MIP images confirms the above findings IMPRESSION: 1. No emergent finding. 2. Cervical and intracranial atherosclerosis without flow limiting stenosis of major vessels. 3. 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 4. Incidental large submandibular gland calculi. 5. Emphysema. Electronically Signed   By: Marnee Spring M.D.   On: 05/04/2018 06:24   Dg Chest 2 View  Result Date: 05/04/2018 CLINICAL DATA:  Stroke-like symptoms EXAM: CHEST - 2 VIEW  COMPARISON:  12/25/2014 FINDINGS: Apical emphysematous change. Hyperinflation with diaphragm flattening. There is no edema, consolidation, effusion, or pneumothorax. Normal heart size and mediastinal contours. Artifact from EKG leads. IMPRESSION: 1. No acute finding. 2. Emphysema. Electronically Signed   By: Marnee Spring M.D.   On: 05/04/2018 07:15   Ct Angio Neck W Or Wo Contrast  Result Date: 05/04/2018 CLINICAL DATA:  Weakness, left-sided, with dizziness EXAM: CT ANGIOGRAPHY HEAD AND NECK TECHNIQUE: Multidetector CT imaging of the head and neck was performed using the standard protocol during bolus administration of intravenous contrast. Multiplanar CT image reconstructions and MIPs were obtained to evaluate the vascular anatomy. Carotid stenosis measurements (when applicable) are obtained utilizing NASCET criteria, using the distal internal carotid diameter as the denominator. CONTRAST:  75mL ISOVUE-370 IOPAMIDOL (ISOVUE-370) INJECTION 76% COMPARISON:  Noncontrast head CT earlier today FINDINGS: CTA NECK FINDINGS Aortic arch: Atherosclerosis.  No acute finding.  3 vessel branching Right carotid system: Mild narrowing at the ICA bulb due to atherosclerotic plaque. No ulceration or evident dissection. Beaded appearance of the ICA. Left carotid system: Atheromatous wall thickening of the common carotid and proximal ICA without flow limiting stenosis. ICA beading without ulceration or visible dissection. Vertebral arteries: No proximal subclavian stenosis. Both vertebral arteries are widely patent to the dura. No superimposed beading or dissection is seen Skeleton: Spondylosis.  No acute or aggressive finding Other neck: Large bilateral submandibular gland calculi. No superimposed acute inflammation. Upper chest: Emphysema Review of the MIP images confirms the above findings CTA HEAD FINDINGS Anterior circulation: Mild atherosclerotic plaque along the carotid siphons. No branch occlusion or flow limiting  stenosis. Negative for aneurysm Posterior circulation: Widely patent vertebral and basilar arteries. Hypoplastic P1 segments. No branch occlusion. Negative for aneurysm. Venous sinuses: Patent Anatomic variants: As above Delayed phase: Not obtained in the emergent setting Review of the MIP images confirms the above findings IMPRESSION: 1. No emergent finding. 2. Cervical and intracranial atherosclerosis without flow limiting stenosis of major vessels. 3. 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 4. Incidental large submandibular gland calculi. 5. Emphysema. Electronically Signed   By: Marnee Spring M.D.   On: 05/04/2018 06:24   Mr Brain Wo Contrast  Result Date: 05/04/2018 CLINICAL DATA:  Left-sided weakness in dizziness. EXAM: MRI HEAD WITHOUT CONTRAST TECHNIQUE: Multiplanar, multiecho pulse sequences of the brain and surrounding structures were obtained without intravenous contrast. COMPARISON:  CT studies earlier same day FINDINGS: Brain: Diffusion imaging does not show any acute or subacute infarction. There are chronic small-vessel ischemic changes of pons. No focal cerebellar finding. Few old small vessel insults of the thalami. No abnormality of the hemispheric white matter or cortex. No mass lesion, hemorrhage, hydrocephalus or extra-axial collection. 3-4 mm dural calcification in the right posterior temporal region as previously seen, not significant. Vascular: Major vessels at the base of the brain show flow. Skull and upper cervical spine: Negative Sinuses/Orbits: Ordinary mild seasonal  mucosal thickening. Orbits negative. Other: None IMPRESSION: No acute finding. Chronic small-vessel ischemic changes of the pons. Few old small vessel infarctions of the thalami. Electronically Signed   By: Paulina Fusi M.D.   On: 05/04/2018 09:07   Ct Head Code Stroke Wo Contrast  Result Date: 05/04/2018 CLINICAL DATA:  Code stroke.  Left-sided weakness EXAM: CT HEAD WITHOUT CONTRAST  TECHNIQUE: Contiguous axial images were obtained from the base of the skull through the vertex without intravenous contrast. COMPARISON:  None. FINDINGS: Brain: No evidence of acute infarction, hemorrhage, hydrocephalus, extra-axial collection or mass effect. 9 mm calcification along the right cerebral convexity could reflect a tiny meningioma. Vascular: No hyperdense vessel Skull: Negative Sinuses/Orbits: Negative Other: These results were communicated to Dr. Wilford Corner at 6:09 amon 3/16/2020by text page via the [facility] messaging system. ASPECTS [facility] Stroke Program Early CT Score) - Ganglionic level infarction (caudate, lentiform nuclei, internal capsule, insula, M1-M3 cortex): 7 - Supraganglionic infarction (M4-M6 cortex): 3 Total score (0-10 with 10 being normal): 10 IMPRESSION: No acute finding.ASPECTS is 10. Electronically Signed   By: Marnee Spring M.D.   On: 05/04/2018 06:11   2D Echocardiogram   1. The left ventricle has low normal systolic function, with an ejection fraction of 50-55%. The cavity size was mildly dilated. Left ventricular diastolic parameters were normal. No evidence of left ventricular regional wall motion abnormalities.  2. The right ventricle has normal systolic function. The cavity was normal. There is no increase in right ventricular wall thickness.  3. The pericardial effusion is posterior to the left ventricle.  4. Trivial pericardial effusion is present.  5. The mitral valve is abnormal. Mild thickening of the mitral valve leaflet. Mild calcification of the mitral valve leaflet.  6. There is a small area of thickening below the mitral valve, on the ventricular side of the anterior leaflet, that may be thickened chordae or degenerative valve.  7. The aortic valve is tricuspid Mild thickening of the aortic valve Mild calcification of the aortic valve. Aortic valve regurgitation is mild by color flow Doppler.   PHYSICAL EXAM Pleasant middle-aged African-American lady not in  distress. . Afebrile. Head is nontraumatic. Neck is supple without bruit.    Cardiac exam no murmur or gallop. Lungs are clear to auscultation. Distal pulses are well felt. Neurological Exam ;  Awake  Alert oriented x 3. Normal speech and language.eye movements full without nystagmus.fundi were not visualized. Vision acuity and fields appear normal. Hearing is normal. Palatal movements are normal. Face symmetric. Tongue midline. Normal strength, tone, reflexes and coordination. Subjective diminished left hand and forearm sensation. Gait deferred.  ASSESSMENT/PLAN Shannon [name] is a 68 y.o. female with history of asthma, HTN, HLD, chronic back pain presenting with L arm weakness.   R brain TIA likely small vessel disease  Code Stroke CT head No acute stroke. ASPECTS 10.     CTA head & neck no LVO, atherosclerosis. FMD B distal ICA. Incidental large submandibular gland calculi. Emphysema.  MRI  No acute stroke. 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   2D Echo  EF 50-55%. No source of embolus   LDL 151  HgbA1c 6.4  Lovenox 40 mg sq daily for VTE prophylaxis  aspirin 81 mg daily prior to admission, now on aspirin 81 mg and plavix 75 mg daily x 3 weeks, then PLAVIX alone.   Therapy recommendations:  No OT and HH PT   Disposition:  pending  Ok for d/c from stroke standpoint Follow-up  Stroke Clinic at [facility] in 4 weeks. Office will call with appointment date and time. Order placed.  Hypertension  Stable . BP goal normotensive  Hyperlipidemia  Home meds:  zocor 20  Changed to lipitor 40 on arrival  LDL 151, goal < 70  Given elevated LDL, increased lipitor to 80  Continue statin at discharge  Needs follow up  Other Stroke Risk Factors  Advanced age  Former Cigarette smoker  ETOH use, advised to drink no more than 1 drink(s) a day  Other Active Problems  Hx asthma  GERD  Chronic LBP  Hospital day # 0  Annie Main, MSN, APRN, ANVP-BC, AGPCNP-BC Advanced Practice Stroke Nurse [facility] See Amion for Schedule & Pager information 05/05/2018 10:49 AM  Agree with note above. Recommend discharge home today with  home physical therapy. Follow-up as an outpatient in stroke clinic. Stroke team will sign off. Delia Heady, MD To contact Stroke Continuity provider, please refer to [email or web address]. After hours, contact General Neurology

## 2018-05-05 NOTE — Progress Notes (Signed)
   Subjective: No overnight events. Ms. Kepley reports that she has a headache. Her strength is improving. She takes clonidine and metoprolol at home for high blood pressure, with reportedly good compliance. She takes fluoxetine for anxiety. The results of her tests were discussed with the patient. All questions were answered.   Objective:  Vital signs in last 24 hours: Vitals:   05/05/18 0007 05/05/18 0500 05/05/18 0732 05/05/18 0832  BP: 137/62 137/70 (!) 147/72   Pulse: 80  89   Resp: 18 16 18    Temp: 98.7 F (37.1 C) 98.5 F (36.9 C) 99 F (37.2 C)   TempSrc: Oral Oral Oral   SpO2: 99% 99% 96% 100%  Weight:      Height:       Gen: lying comfortably in bed, no distress Neuro: alert and oriented x3, strength 4+/5 in the left hand and 5/5 in the right hand. CN2-12 intact, lower extremity strength 5/5 bilaterally, sensation intact throughout CV: RRR, no murmurs Pulm: CTAB, normal effort on room air   Assessment/Plan:  Active Problems:   TIA (transient ischemic attack)  Davanee Coverstone Williamsonis a 68 y.o.femalewith a past medical history of asthma, HLD, HTN,GERD who presents to ED with complaint of sudden onset left-sided weakness started around 4 AM.  TIA: MRI without any acute infarct but did show chronic small vessel ischemic changes in pons and thalami.  CTA with cervical and intracranial atherosclerosis without any flow limitation.  Improving neurologic exam. Passed swallow evaluation. Grip strength 4+/5 on the left. Otherwise, neuro exam is normal.  - echo without embolic source, MV mildly calcified  - a1c 6.5, recommend lifestyle modifications - LDL 151, started high intensity statin  -PT: HH - OT: pending  -Aspirin and plavix for 3 weeks, then plavis alone   Hypertension.  Currently normotensive, holding home antihypertensives. She was on clonidine and metoprolol at home.  She did not know why she was placed on this regimen and denies any ACEI allergies.  With  her A1c being 6.4, although no protein in urine she might get benefit with ACE inhibitor. - hold metoprolol and clonidine on discharge and recommend close PCP f/u to discuss a different regiment   Dispo: Anticipated discharge in approximately today pending OT recommendations   Ali Lowe, MD 05/05/2018, 10:18 AM Pager: [phone]

## 2018-05-05 NOTE — Discharge Summary (Signed)
Name: Shannon [name] MRN: [id number] DOB: 02/05/1951 68 y.o. PCP: Medicine, [facility]  Date of Admission: 05/04/2018  5:56 AM Date of Discharge: 05/05/18 Attending Physician: Earl Lagos, MD  Discharge Diagnosis: 1. TIA 2. Pre-diabetes 3. HLD 4. HTN  Discharge Medications: Allergies as of 05/05/2018   No Known Allergies     Medication List    STOP taking these medications   cloNIDine 0.2 MG tablet Commonly known as:  CATAPRES   metoprolol tartrate 25 MG tablet Commonly known as:  LOPRESSOR   pantoprazole 40 MG tablet Commonly known as:  PROTONIX   predniSONE 20 MG tablet Commonly known as:  DELTASONE   simvastatin 20 MG tablet Commonly known as:  ZOCOR     TAKE these medications   albuterol 108 (90 Base) MCG/ACT inhaler Commonly known as:  PROVENTIL HFA;VENTOLIN HFA Inhale 2 puffs into the lungs every 2 (two) hours as needed for wheezing or shortness of breath (cough).   aspirin EC 81 MG tablet Take 1 tablet (81 mg total) by mouth daily for 21 days.   atorvastatin 40 MG tablet Commonly known as:  LIPITOR Take 1 tablet (40 mg total) by mouth daily at 6 PM.   cholecalciferol 25 MCG (1000 UT) tablet Commonly known as:  VITAMIN D3 Take 2,000 Units by mouth daily.   clopidogrel 75 MG tablet Commonly known as:  PLAVIX Take 1 tablet (75 mg total) by mouth daily. Start taking on:  May 06, 2018   desloratadine 5 MG tablet Commonly known as:  CLARINEX Take 5 mg by mouth daily.   FLUoxetine 10 MG capsule Commonly known as:  PROZAC Take 10 mg by mouth daily.   fluticasone 50 MCG/ACT nasal spray Commonly known as:  FLONASE Place 1 spray into both nostrils daily.   Fluticasone-Salmeterol 500-50 MCG/DOSE Aepb Commonly known as:  ADVAIR Inhale 1 puff into the lungs 2 (two) times daily. What changed:  Another medication with the same name was removed. Continue taking this medication, and follow the directions you see here.    gabapentin 600 MG tablet Commonly known as:  NEURONTIN Take 600 mg by mouth at bedtime.       Disposition and follow-up:   Shannon [name] was discharged from [facility] in Good condition.  At the hospital follow up visit please address:  1.  TIA: Only neuro deficit on discharge was 4+/5 left grip strength. Home simvastatin was switched to atorvastatin. Continue asa 81mg  for 3 weeks and then discontinue. Start Plavix and continue indefinitely.   HTN: please discuss BP meds. Unclear why she was on clonidine and metoprolol. Seems like an old regiment. Normotensive while holding these meds so they were discontinued on discharge. Consider starting ACEi.   2.  Labs / imaging needed at time of follow-up: none  3.  Pending labs/ test needing follow-up: none   Follow-up Appointments: Follow-up Information    [facility] Follow up in 4 week(s).   Specialty:  Neurology Why:  stroke clinic. office will call with appt date and time.  Contact information: [address] [place] [address] [phone]       Medicine, [facility]. Schedule an appointment as soon as possible for a visit in 1 week(s).   Specialty:  Family Medicine Why:  to discuss blood pressure medications Contact information: [address] [place] [address] [phone]           Hospital Course  by problem list: 1. TIA: R brain, likely small vessel disease - presented with acute left arm weakness, could not move left fingers. At discharge, left grip strength was 4+/5. Otherwise, neuro exam was normal - MRI and CTA head/neck without any acute changes or large vessel occlusion  - no PFO on echo  - a1c 6.4: lifestyle modifications - LDL 151: started on atorvastatin 40mg  qd, home simvastatin discontinued  - asa 81mg  + plavix 75mg  daily for 3 weeks, followed by plavix alone  - HH PT arranged at discharge  2. HTN:  - remained  normotensive while holding home metoprolol and clonidine - unclear why she is on this BP regiment - discontinued metop and clonidine at discharge - recommend close PCP f/u to discuss new regiment. Consider starting ACEi given pre-diabetes. UA was without proteinuria.   Discharge Vitals:   BP (!) 147/72 (BP Location: Left Arm)   Pulse 89   Temp 99 F (37.2 C) (Oral)   Resp 18   Ht 5\' 6"  (1.676 m)   Wt 74 kg   SpO2 100%   BMI 26.33 kg/m   Pertinent Labs, Studies, and Procedures:   MRI brain: No acute finding. Chronic small-vessel ischemic changes of the pons. Few old small vessel infarctions of the thalami.  FINDINGS: Brain: Diffusion imaging does not show any acute or subacute infarction. There are chronic small-vessel ischemic changes of pons. No focal cerebellar finding. Few old small vessel insults of the thalami. No abnormality of the hemispheric white matter or cortex. No mass lesion, hemorrhage, hydrocephalus or extra-axial collection. 3-4 mm dural calcification in the right posterior temporal region as previously seen, not significant.  Vascular: Major vessels at the base of the brain show flow.  Skull and upper cervical spine: Negative  Sinuses/Orbits: Ordinary mild seasonal mucosal thickening. Orbits negative.  CTA head/neck: 1. No emergent finding. 2. Cervical and intracranial atherosclerosis without flow limiting stenosis of major vessels. 3. 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 4. Incidental large submandibular gland calculi. 5. Emphysema.  FINDINGS: CTA NECK FINDINGS  Aortic arch: Atherosclerosis.  No acute finding.  3 vessel branching  Right carotid system: Mild narrowing at the ICA bulb due to atherosclerotic plaque. No ulceration or evident dissection. Beaded appearance of the ICA.  Left carotid system: Atheromatous wall thickening of the common carotid and proximal ICA without flow limiting stenosis. ICA  beading without ulceration or visible dissection.  Vertebral arteries: No proximal subclavian stenosis. Both vertebral arteries are widely patent to the dura. No superimposed beading or dissection is seen  Skeleton: Spondylosis.  No acute or aggressive finding  Other neck: Large bilateral submandibular gland calculi. No superimposed acute inflammation.  Upper chest: Emphysema  Review of the MIP images confirms the above findings  CTA HEAD FINDINGS  Anterior circulation: Mild atherosclerotic plaque along the carotid siphons. No branch occlusion or flow limiting stenosis. Negative for aneurysm  Posterior circulation: Widely patent vertebral and basilar arteries. Hypoplastic P1 segments. No branch occlusion. Negative for aneurysm.  Venous sinuses: Patent  Anatomic variants: As above  Delayed phase: Not obtained in the emergent setting  Review of the MIP images confirms the above findings  Echo:  1. The left ventricle has low normal systolic function, with an ejection fraction of 50-55%. The cavity size was mildly dilated. Left ventricular diastolic parameters were normal. No evidence of left ventricular regional wall motion abnormalities.  2. The right ventricle has normal systolic function. The cavity was normal. There is no  increase in right ventricular wall thickness.  3. The pericardial effusion is posterior to the left ventricle.  4. Trivial pericardial effusion is present.  5. The mitral valve is abnormal. Mild thickening of the mitral valve leaflet. Mild calcification of the mitral valve leaflet.  6. There is a small area of thickening below the mitral valve, on the ventricular side of the anterior leaflet, that may be thickened chordae or degenerative valve.  7. The aortic valve is tricuspid Mild thickening of the aortic valve Mild calcification of the aortic valve. Aortic valve regurgitation is mild by color flow Doppler.  Lipid Panel     Component Value  Date/Time   CHOL 247 (H) 05/04/2018 1030   TRIG 95 05/04/2018 1030   HDL 77 05/04/2018 1030   CHOLHDL 3.2 05/04/2018 1030   VLDL 19 05/04/2018 1030   LDLCALC 151 (H) 05/04/2018 1030     Discharge Instructions: Discharge Instructions    Ambulatory referral to Neurology   Complete by:  As directed    Follow up with stroke clinic NP (Jessica Vanschaick or Darrol Angel, if both not available, consider Dr. Delia Heady, Dr. Jamelle Rushing, or Dr. Naomie Dean) at [facility] in about 4 weeks.   Diet - low sodium heart healthy   Complete by:  As directed    Discharge instructions   Complete by:  As directed    Ms. Dawnie, Milley were admitted to the hospital because you had a stroke. Thankfully, your symptoms improved. It will be very important to follow up with your regular doctor to make sure you are staying as healthy as possible to try and prevent another stroke. We have made some changes to your medications. Please look closely at your updated medication list. A summary of the medication changes are as follows:   For high cholesterol: - stop Zocor (simvastatin) - start Lipitor (atorvastatin)  For high blood pressure: - stop metoprolol and clonidine (these are old medicines and not used very often anymore) - follow up with your regular doctor to discuss starting a different blood pressure medication  For your stroke: - take aspirin daily for 3 weeks, then stop taking it - start Plavix (clopidogrel) daily. You will take this medication forever. It will help thin your blood and prevent another stroke.  For your pre-diabetes: - Please follow up with your doctor to monitor your sugars - There are medications your doctor can start you on but first, I recommend lifestyle modifications - I have included some information on the DASH diet   Increase activity slowly   Complete by:  As directed       Signed: Ali Lowe, MD 05/05/2018, 10:53 AM   Pager:  [phone]

## 2018-05-05 NOTE — Evaluation (Signed)
Occupational Therapy Evaluation Patient Details Name: Shannon [name] MRN: [id number] DOB: [birth year] Today's Date: 05/05/2018    History of Present Illness Patient is 68 y/o female presenting to hospital with L hand numbness and weakness secondary to TIA. MRI and CT negative for acute stroke. Chest x-ray revealed emphysema. PMH includes asthma, GERD, HTN, HLD, and chronic low back pain.   Clinical Impression   PTA patient reports independent with ADLs, IADLs, but does not drive. Admitted for above and limited by problem list below, including L UE weakness and impaired FMC, decreased activity tolerance.  She is able to complete self care with close supervision for UB/LB dressing, functional transfers (simulated toilet/tub), and functional mobility in room.  Noted HR increased to 140s during simple ADLs, asymptomatic.  Patient reports available [place] use for bathing seated in shower, after education on safety and recommendations.  Provided [facility] handout for HEP of exercise and items commonly found at home.  Will continue to follow while admitted to maximize independence, safety and L UE strength/coordination, but anticipate no further needs after dc home.      Follow Up Recommendations  No OT follow up;Supervision - Intermittent    Equipment Recommendations  None recommended by OT    Recommendations for Other Services       Precautions / Restrictions Precautions Precautions: Other (comment) Precaution Comments: watch HR  Restrictions Weight Bearing Restrictions: No      Mobility Bed Mobility Overal bed mobility: Modified Independent             General bed mobility comments: no assist required  Transfers Overall transfer level: Needs assistance Equipment used: None Transfers: Sit to/from Stand Sit to Stand: Supervision         General transfer comment: supervision for safety, no assist required    Balance Overall balance assessment: Needs assistance Sitting-balance  support: No upper extremity supported;Feet supported Sitting balance-Leahy Scale: Good     Standing balance support: Bilateral upper extremity supported;During functional activity Standing balance-Leahy Scale: Fair Standing balance comment: supervision for safety and balance, no losses during in room and functional mobility                            ADL either performed or assessed with clinical judgement   ADL Overall ADL's : Needs assistance/impaired Eating/Feeding: Modified independent;Sitting   Grooming: Oral care;Supervision/safety;Standing   Upper Body Bathing: Set up;Sitting   Lower Body Bathing: Supervison/ safety;Set up;Sit to/from stand Lower Body Bathing Details (indicate cue type and reason): reviewed safety seated, using shower chair for safety  (pt reports having at home and agreeable to using)  Upper Body Dressing : Supervision/safety;Set up;Sitting   Lower Body Dressing: Sit to/from stand;Supervision/safety Lower Body Dressing Details (indicate cue type and reason): able to adjust socks with out assist, sit to stand with supervision Toilet Transfer: Supervision/safety;Ambulation Toilet Transfer Details (indicate cue type and reason): simulated in room  Toileting- Clothing Manipulation and Hygiene: Supervision/safety;Sit to/from stand   Tub/ Shower Transfer: Tub transfer;Supervision/safety;Ambulation;Shower Field seismologist Details (indicate cue type and reason): simulated, cueing for safety and agreeable to use [place]  Functional mobility during ADLs: Min guard;Supervision/safety General ADL Comments: min guard initally fading to supervision for functional mobility not using RW, limited by L UE weakness but functional use      Vision Baseline Vision/History: Wears glasses Wears Glasses: Reading only Patient Visual Report: No change from baseline Vision Assessment?: No apparent visual deficits Additional Comments: appears  [facility]      Perception      Praxis      Pertinent Vitals/Pain Pain Assessment: No/denies pain     Hand Dominance Right   Extremity/Trunk Assessment Upper Extremity Assessment Upper Extremity Assessment: LUE deficits/detail LUE Deficits / Details: grossly weak, 3+/5 compared to 4+/5 L UE; decreased sensation  LUE Sensation: decreased light touch LUE Coordination: decreased fine motor   Lower Extremity Assessment Lower Extremity Assessment: Defer to PT evaluation   Cervical / Trunk Assessment Cervical / Trunk Assessment: Normal   Communication Communication Communication: No difficulties   Cognition Arousal/Alertness: Awake/alert Behavior During Therapy: WFL for tasks assessed/performed Overall Cognitive Status: Within Functional Limits for tasks assessed                                     General Comments  HR upto 140 during functional ADLs     Exercises Exercises: Other exercises Other Exercises Other Exercises: Provided [facility] handout and reviewed with pt    Shoulder Instructions      Home Living Family/patient expects to be discharged to:: Private residence Living Arrangements: Children Available Help at Discharge: Family;Available 24 hours/day Type of Home: House Home Access: Stairs to enter [employer] of Steps: 4 Entrance Stairs-Rails: None Home Layout: One level     Bathroom Shower/Tub: Chief Strategy Officer: Standard     Home Equipment: Environmental consultant - 2 wheels;Shower seat          Prior Functioning/Environment Level of Independence: Independent        Comments: completes IADLs, does not drive or work         OT Problem List: Decreased strength;Impaired balance (sitting and/or standing);Decreased coordination;Decreased knowledge of use of DME or AE;Decreased knowledge of precautions;Impaired sensation;Impaired UE functional use      OT Treatment/Interventions: Self-care/ADL training;Energy conservation;Neuromuscular  education;Therapeutic activities;Balance training;Patient/family education    OT Goals(Current goals can be found in the care plan section) Acute Rehab OT Goals Patient Stated Goal: go home OT Goal Formulation: With patient Time For Goal Achievement: 05/19/18 Potential to Achieve Goals: Good  OT Frequency: Min 2X/week   Barriers to D/C:            Co-evaluation              AM-PAC OT "6 Clicks" Daily Activity     Outcome Measure Help from another person eating meals?: None Help from another person taking care of personal grooming?: None Help from another person toileting, which includes using toliet, bedpan, or urinal?: None Help from another person bathing (including washing, rinsing, drying)?: A Little Help from another person to put on and taking off regular upper body clothing?: None Help from another person to put on and taking off regular lower body clothing?: None 6 Click Score: 23   End of Session Equipment Utilized During Treatment: Gait belt Nurse Communication: Mobility status  Activity Tolerance: Patient tolerated treatment well Patient left: in bed;with call bell/phone within reach;with bed alarm set  OT Visit Diagnosis: Unsteadiness on feet (R26.81);Other symptoms and signs involving the nervous system (R29.898);Muscle weakness (generalized) (M62.81)                Time: 0881-1031 OT Time Calculation (min): 17 min Charges:  OT General Charges $OT Visit: 1 Visit OT Evaluation $OT Eval Low Complexity: 1 Low  Chancy Milroy, OT Acute Rehabilitation Services Pager [phone] Office [phone]  Chancy Milroy 05/05/2018, 10:31 AM

## 2018-05-05 NOTE — TOC Transition Note (Signed)
Transition of Care [facility]) - CM/SW Discharge Note   Patient Details  Name: Shannon [name] MRN: [id number] Date of Birth: [birth year]  [facility] CM/SW Contact:  Kermit Balo, RN Phone Number: 05/05/2018, 12:12 PM   Clinical Narrative:    Pt has transportation home.   Final next level of care: Home w Home Health Services Barriers to Discharge: No Barriers Identified   Patient Goals and CMS Choice Patient states their goals for this hospitalization and ongoing recovery are:: to get home CMS Medicare.gov Compare Post Acute Care list provided to:: Patient Choice offered to / list presented to : Patient  Discharge Placement                       Discharge Plan and Services Discharge Planning Services: CM Consult Post Acute Care Choice: Home Health              HH Arranged: PT [facility] Agency: [facility] Services(Cheryl with [facility] accepted the referral)   Social Determinants of Health (SDOH) Interventions     Readmission Risk Interventions No flowsheet data found.

## 2018-05-05 NOTE — Care Management Obs Status (Signed)
MEDICARE OBSERVATION STATUS NOTIFICATION   Patient Details  Name: Shannon [name] MRN: [id number] Date of Birth: [birth year]   Medicare Observation Status Notification Given:  Yes    Kermit Balo, RN 05/05/2018, 11:48 AM

## 2018-05-05 NOTE — Progress Notes (Signed)
Pt discharge education and instructions completed. Pt discharge home with family to transport her home. Pt IV and telemetry removed; pt to pick up electronically sent prescriptions from preferred pharmacy on file. Pt transported off unit via wheelchair with belongings to the side. Dionne Bucy RN

## 2018-05-05 NOTE — Progress Notes (Signed)
  Date: 05/05/2018  Patient name: Shannon [name]  Medical record number: [id number]  Date of birth: 02-25-1950   I have seen and evaluated Shannon [name] and discussed their care with the Residency Team.  In brief, patient is a 68 year old female with a past medical history of asthma, hyperlipidemia, hypertension, GERD who presented to the ED with sudden onset left-sided weakness.  Patient was watching TV and was sleeping intermittently and woke up to get ready for bed around 4 AM.  At that time she noted weakness on her left side more in her left upper extremity.  She also complained of some difficulty ambulating.  No chest pain, no shortness of breath, no palpitations, no lightheadedness, no syncope, no headache, no blurry vision, no fevers or chills, no abdominal pain, no diarrhea, no nausea or vomiting.  Today patient states that her left sided weakness is improving and is almost resolved.  PMHx, Fam Hx, and/or Soc Hx : As per resident admit note  Vitals:   05/05/18 0832 05/05/18 1120  BP:  (!) 146/67  Pulse:  90  Resp:  20  Temp:  98.8 F (37.1 C)  SpO2: 100% 97%   General: Awake, alert, oriented x3, NAD CVS: Regular rate and rhythm, normal heart sounds Lungs: CTA bilaterally Abdomen: Soft, nontender, nondistended, normoactive bowel sounds Extremities: No edema noted Neuro: Mild decreased grip strength in left hand but otherwise power is 5 out of 5 bilateral upper and lower extremities, sensation is intact, cranial nerves II to XII grossly intact  Assessment and Plan: I have seen and evaluated the patient as outlined above. I agree with the formulated Assessment and Plan as detailed in the residents' note, with the following changes:   1.  TIA: -Patient presented to the ED with new onset left-sided weakness worse in her left upper extremity in the setting of a history of hypertension.  CTA showed some mild intracranial atherosclerosis without any flow limitation.  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  2D echo showed a normal EF with no regional wall motion abnormalities and some mild thickening of the mitral valve leaflet and no embolic source.  We will continue with high intensity statin as well as aspirin and Plavix for 3 weeks and then Plavix alone.  PT/OT recommendations appreciated.  Patient to go home with home PT.  No further work-up at this time.  Patient stable for DC home today.  Of note, patient's systolic blood pressures have been in the 130s to 140s off of her home metoprolol and clonidine.  I am uncertain as to why this regimen is being used in this patient.  We will have patient follow-up with her PCP for resumption of these meds if needed.  She may benefit from an ACE inhibitor or ARB given her elevated A1c and hypertension especially if she has proteinuria.  Earl Lagos, MD 3/17/20203:34 PM

## 2018-05-05 NOTE — Discharge Instructions (Signed)
DASH Eating Plan °DASH stands for "Dietary Approaches to Stop Hypertension." The DASH eating plan is a healthy eating plan that has been shown to reduce high blood pressure (hypertension). It may also reduce your risk for type 2 diabetes, heart disease, and stroke. The DASH eating plan may also help with weight loss. °What are tips for following this plan? ° °General guidelines °· Avoid eating more than 2,300 mg (milligrams) of salt (sodium) a day. If you have hypertension, you may need to reduce your sodium intake to 1,500 mg a day. °· Limit alcohol intake to no more than 1 drink a day for nonpregnant women and 2 drinks a day for men. One drink equals 12 oz of beer, 5 oz of wine, or 1½ oz of hard liquor. °· Work with your health care provider to maintain a healthy body weight or to lose weight. Ask what an ideal weight is for you. °· Get at least 30 minutes of exercise that causes your heart to beat faster (aerobic exercise) most days of the week. Activities may include walking, swimming, or biking. °· Work with your health care provider or diet and nutrition specialist (dietitian) to adjust your eating plan to your individual calorie needs. °Reading food labels ° °· Check food labels for the amount of sodium per serving. Choose foods with less than 5 percent of the Daily Value of sodium. Generally, foods with less than 300 mg of sodium per serving fit into this eating plan. °· To find whole grains, look for the word "whole" as the first word in the ingredient list. °Shopping °· Buy products labeled as "low-sodium" or "no salt added." °· Buy fresh foods. Avoid canned foods and premade or frozen meals. °Cooking °· Avoid adding salt when cooking. Use salt-free seasonings or herbs instead of table salt or sea salt. Check with your health care provider or pharmacist before using salt substitutes. °· Do not fry foods. Cook foods using healthy methods such as baking, boiling, grilling, and broiling instead. °· Cook with  heart-healthy oils, such as olive, canola, soybean, or sunflower oil. °Meal planning °· Eat a balanced diet that includes: °? 5 or more servings of fruits and vegetables each day. At each meal, try to fill half of your plate with fruits and vegetables. °? Up to 6-8 servings of whole grains each day. °? Less than 6 oz of lean meat, poultry, or fish each day. A 3-oz serving of meat is about the same size as a deck of cards. One egg equals 1 oz. °? 2 servings of low-fat dairy each day. °? A serving of nuts, seeds, or beans 5 times each week. °? Heart-healthy fats. Healthy fats called Omega-3 fatty acids are found in foods such as flaxseeds and coldwater fish, like sardines, salmon, and mackerel. °· Limit how much you eat of the following: °? Canned or prepackaged foods. °? Food that is high in trans fat, such as fried foods. °? Food that is high in saturated fat, such as fatty meat. °? Sweets, desserts, sugary drinks, and other foods with added sugar. °? Full-fat dairy products. °· Do not salt foods before eating. °· Try to eat at least 2 vegetarian meals each week. °· Eat more home-cooked food and less restaurant, buffet, and fast food. °· When eating at a restaurant, ask that your food be prepared with less salt or no salt, if possible. °What foods are recommended? °The items listed may not be a complete list. Talk with your dietitian about   what dietary choices are best for you. °Grains °Whole-grain or whole-wheat bread. Whole-grain or whole-wheat pasta. Brown rice. Oatmeal. Quinoa. Bulgur. Whole-grain and low-sodium cereals. Pita bread. Low-fat, low-sodium crackers. Whole-wheat flour tortillas. °Vegetables °Fresh or frozen vegetables (raw, steamed, roasted, or grilled). Low-sodium or reduced-sodium tomato and vegetable juice. Low-sodium or reduced-sodium tomato sauce and tomato paste. Low-sodium or reduced-sodium canned vegetables. °Fruits °All fresh, dried, or frozen fruit. Canned fruit in natural juice (without  added sugar). °Meat and other protein foods °Skinless chicken or turkey. Ground chicken or turkey. Pork with fat trimmed off. Fish and seafood. Egg whites. Dried beans, peas, or lentils. Unsalted nuts, nut butters, and seeds. Unsalted canned beans. Lean cuts of beef with fat trimmed off. Low-sodium, lean deli meat. °Dairy °Low-fat (1%) or fat-free (skim) milk. Fat-free, low-fat, or reduced-fat cheeses. Nonfat, low-sodium ricotta or cottage cheese. Low-fat or nonfat yogurt. Low-fat, low-sodium cheese. °Fats and oils °Soft margarine without trans fats. Vegetable oil. Low-fat, reduced-fat, or light mayonnaise and salad dressings (reduced-sodium). Canola, safflower, olive, soybean, and sunflower oils. Avocado. °Seasoning and other foods °Herbs. Spices. Seasoning mixes without salt. Unsalted popcorn and pretzels. Fat-free sweets. °What foods are not recommended? °The items listed may not be a complete list. Talk with your dietitian about what dietary choices are best for you. °Grains °Baked goods made with fat, such as croissants, muffins, or some breads. Dry pasta or rice meal packs. °Vegetables °Creamed or fried vegetables. Vegetables in a cheese sauce. Regular canned vegetables (not low-sodium or reduced-sodium). Regular canned tomato sauce and paste (not low-sodium or reduced-sodium). Regular tomato and vegetable juice (not low-sodium or reduced-sodium). Pickles. Olives. °Fruits °Canned fruit in a light or heavy syrup. Fried fruit. Fruit in cream or butter sauce. °Meat and other protein foods °Fatty cuts of meat. Ribs. Fried meat. Bacon. Sausage. Bologna and other processed lunch meats. Salami. Fatback. Hotdogs. Bratwurst. Salted nuts and seeds. Canned beans with added salt. Canned or smoked fish. Whole eggs or egg yolks. Chicken or turkey with skin. °Dairy °Whole or 2% milk, cream, and half-and-half. Whole or full-fat cream cheese. Whole-fat or sweetened yogurt. Full-fat cheese. Nondairy creamers. Whipped toppings.  Processed cheese and cheese spreads. °Fats and oils °Butter. Stick margarine. Lard. Shortening. Ghee. Bacon fat. Tropical oils, such as coconut, palm kernel, or palm oil. °Seasoning and other foods °Salted popcorn and pretzels. Onion salt, garlic salt, seasoned salt, table salt, and sea salt. Worcestershire sauce. Tartar sauce. Barbecue sauce. Teriyaki sauce. Soy sauce, including reduced-sodium. Steak sauce. Canned and packaged gravies. Fish sauce. Oyster sauce. Cocktail sauce. Horseradish that you find on the shelf. Ketchup. Mustard. Meat flavorings and tenderizers. Bouillon cubes. Hot sauce and Tabasco sauce. Premade or packaged marinades. Premade or packaged taco seasonings. Relishes. Regular salad dressings. °Where to find more information: °· National Heart, Lung, and Blood Institute: www.nhlbi.nih.gov °· American Heart Association: www.heart.org °Summary °· The DASH eating plan is a healthy eating plan that has been shown to reduce high blood pressure (hypertension). It may also reduce your risk for type 2 diabetes, heart disease, and stroke. °· With the DASH eating plan, you should limit salt (sodium) intake to 2,300 mg a day. If you have hypertension, you may need to reduce your sodium intake to 1,500 mg a day. °· When on the DASH eating plan, aim to eat more fresh fruits and vegetables, whole grains, lean proteins, low-fat dairy, and heart-healthy fats. °· Work with your health care provider or diet and nutrition specialist (dietitian) to adjust your eating plan to your   individual calorie needs. This information is not intended to replace advice given to you by your health care provider. Make sure you discuss any questions you have with your health care provider. Document Released: 01/24/2011 Document Revised: 01/29/2016 Document Reviewed: 01/29/2016 Elsevier Interactive Patient Education  2019 [employer]   Transient Ischemic Attack A transient ischemic attack (TIA) is a "warning stroke" that  causes stroke-like symptoms that go away quickly. A TIA does not cause lasting damage to the brain. But having a TIA is a sign that you may be at risk for a stroke. Lifestyle changes and medical treatments can help prevent a stroke. It is important to know the symptoms of a TIA and what to do. Get help right away, even if your symptoms go away. The symptoms of a TIA are the same as those of a stroke. They can happen fast, and they usually go away within minutes or hours. They can include:  Weakness or loss of feeling in your face, arm, or leg. This often happens on one side of your body.  Trouble walking.  Trouble moving your arms or legs.  Trouble talking or understanding what people are saying.  Trouble seeing.  Seeing two of one object (double vision).  Feeling dizzy.  Feeling confused.  Loss of balance or coordination.  Feeling sick to your stomach (nauseous) and throwing up (vomiting).  A very bad headache for no reason.  What increases the risk? Certain things may make you more likely to have a TIA. Some of these are things that you can change, such as:  Being very overweight (obese).  Using products that contain nicotine or tobacco, such as cigarettes and e-cigarettes.  Taking birth control pills.  Not being active.  Drinking too much alcohol.  Using drugs. Other risk factors include:  Having an irregular heartbeat (atrial fibrillation).  Being African American or Hispanic.  Having had blood clots, stroke, TIA, or heart attack in the past.  Being a woman with a history of high blood pressure in pregnancy (preeclampsia).  Being over the age of 82.  Being female.  Having family history of stroke.  Having the following diseases or conditions: ? High blood pressure. ? High cholesterol. ? Diabetes. ? Heart disease. ? Sickle cell disease. ? Sleep apnea. ? Migraine headache. ? Long-term (chronic) diseases that cause soreness and swelling  (inflammation). ? Disorders that affect how your blood clots. Follow these instructions at home: Medicines   Take over-the-counter and prescription medicines only as told by your doctor.  If you were told to take aspirin or another medicine to thin your blood, take it exactly as told by your doctor. ? Taking too much of the medicine can cause bleeding. ? Taking too little of the medicine may not work to treat the problem. Eating and drinking   Eat 5 or more servings of fruits and vegetables each day.  Follow instructions from your doctor about your diet. You may need to follow a certain diet to help lower your risk of having a stroke. You may need to: ? Eat a diet that is low in fat and salt. ? Eat foods that contain a lot of fiber. ? Limit the amount of carbohydrates and sugar in your diet.  Limit alcohol intake to 1 drink a day for nonpregnant women and 2 drinks a day for men. One drink equals 12 oz of beer, 5 oz of wine, or 1 oz of hard liquor. General instructions  Keep a healthy weight.  Stay active. Try to get at least 30 minutes of activity on all or most days.  Find out if you have a condition called sleep apnea. Get treatment if needed.  Do not use any products that contain nicotine or tobacco, such as cigarettes and e-cigarettes. If you need help quitting, ask your doctor.  Do not abuse drugs.  Keep all follow-up visits as told by your doctor. This is important. Get help right away if:  You have any signs of stroke. "BE FAST" is an easy way to remember the main warning signs: ? B - Balance. Signs are dizziness, sudden trouble walking, or loss of balance. ? E - Eyes. Signs are trouble seeing or a sudden change in how you see. ? F - Face. Signs are sudden weakness or loss of feeling of the face, or the face or eyelid drooping on one side. ? A - Arms. Signs are weakness or loss of feeling in an arm. This happens suddenly and usually on one side of the body. ? S -  Speech. Signs are sudden trouble speaking, slurred speech, or trouble understanding what people say. ? T - Time. Time to call emergency services. Write down what time symptoms started.  You have other signs of stroke, such as: ? A sudden, very bad headache with no known cause. ? Feeling sick to your stomach (nausea). ? Throwing up (vomiting). ? Jerky movements that you cannot control (seizure). These symptoms may be an emergency. Do not wait to see if the symptoms will go away. Get medical help right away. Call your local emergency services (911 in the [place] Do not drive yourself to the hospital. Summary  A transient ischemic attack (TIA) is a "warning stroke" that causes stroke-like symptoms that go away quickly.  A TIA is a medical emergency. Get help right away, even if your symptoms go away.  A TIA does not cause lasting damage to the brain.  Having a TIA is a sign that you may be at risk for a stroke. Lifestyle changes and medical treatments can help prevent a stroke. This information is not intended to replace advice given to you by your health care provider. Make sure you discuss any questions you have with your health care provider. Document Released: 11/14/2007 Document Revised: 08/12/2017 Document Reviewed: 05/08/2016 Elsevier Interactive Patient Education  2019 [employer].

## 2018-06-09 ENCOUNTER — Other Ambulatory Visit: Payer: Self-pay

## 2018-06-09 ENCOUNTER — Telehealth: Payer: Self-pay

## 2018-06-09 MED ORDER — CLOPIDOGREL BISULFATE 75 MG PO TABS: 75.0000 mg | ORAL_TABLET | Freq: Every day | ORAL | 0 refills | Status: DC

## 2018-06-09 NOTE — Telephone Encounter (Signed)
I called pt that her visit will be change to video due to COVID 19. I also stated that we are not doing visit in the office a this time due to COVID 19. Pt gave verbal consent to do video and to file insurance. I update PCP,pharmacy, and med list. Pt was given [email or web address] to install on her phone. Pt was given 30 day rx of plavix per Dr.Sethi and PCP will mange ongoing. Pt stated she took her last pill yesterday. She call the PCP and they never return her call. She saw PCP for a hospital visit two weeks after being discharge from the hospital.

## 2018-06-11 NOTE — Telephone Encounter (Signed)
LInk sent to email on 06/11/2018 at 850 for visit on 06/15/2018.

## 2018-06-15 ENCOUNTER — Ambulatory Visit (INDEPENDENT_AMBULATORY_CARE_PROVIDER_SITE_OTHER): Payer: Medicare HMO | Admitting: Adult Health

## 2018-06-15 ENCOUNTER — Encounter: Payer: Self-pay | Admitting: Adult Health

## 2018-06-15 ENCOUNTER — Other Ambulatory Visit: Payer: Self-pay

## 2018-06-15 DIAGNOSIS — R7303 Prediabetes: Secondary | ICD-10-CM

## 2018-06-15 DIAGNOSIS — I10 Essential (primary) hypertension: Secondary | ICD-10-CM

## 2018-06-15 DIAGNOSIS — G459 Transient cerebral ischemic attack, unspecified: Secondary | ICD-10-CM

## 2018-06-15 DIAGNOSIS — E785 Hyperlipidemia, unspecified: Secondary | ICD-10-CM

## 2018-06-15 NOTE — Progress Notes (Signed)
I agree with the above plan 

## 2018-06-15 NOTE — Patient Instructions (Addendum)
Continue clopidogrel 75 mg daily  and Plavix  for secondary stroke prevention  Continue to follow up with PCP regarding cholesterol and blood pressure management - request repeat blood work at follow up visit including cholesterol and diabetes level (lipid panel and A1c)  Continue to monitor blood pressure at home  Maintain strict control of hypertension with blood pressure goal below 130/90, diabetes with hemoglobin A1c goal below 6.5% and cholesterol with LDL cholesterol (bad cholesterol) goal below 70 mg/dL. I also advised the patient to eat a healthy diet with plenty of whole grains, cereals, fruits and vegetables, exercise regularly and maintain ideal body weight.  Followup in the future with me in 6 months or call earlier if needed       Thank you for coming to see [place] at [facility]. I hope we have been able to provide you high quality care today.  You may receive a patient satisfaction survey over the next few weeks. We would appreciate your feedback and comments so that we may continue to improve ourselves and the health of our patients.

## 2018-06-15 NOTE — Progress Notes (Signed)
[facility] [address] [place]. [place] [address] [phone]       VIRTUAL VISIT FOLLOW UP NOTE  [name] Date of Birth:  [birth year] Medical Record Number:  [id number]   Reason for Referral:  hospital stroke follow up    Virtual Visit via Video Note  I connected with Elmer Ramp on 06/15/18 at  3:15 PM EDT by a video enabled telemedicine application located remotely in my own home and verified that I am speaking with the correct person using two identifiers who was located at their own home.   I discussed the limitations of evaluation and management by telemedicine and the availability of in person appointments. The patient expressed understanding and agreed to proceed.    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 History obtained from patient and chart review. Reviewed all radiology images and labs personally.   Ms. ZIANNA [name] is a 68 y.o. female with history of asthma, HTN, HLD, and chronic back pain who presented to [facility] ED with L arm weakness.  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  CTA head and neck negative for LVO but did show arthrosclerosis, FMD B distal ICA, incidental large submandibular gland cocci and emphysema.  2D echo unremarkable.  LDL 151 and A1c 6.4.  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  Recommended DAPT for 3 weeks and Plavix alone along with increasing home statin dose.  Other stroke risk factors include advanced age, former tobacco use, EtOH use and prior stroke on imaging.  Discharged home in stable condition without residual deficits without therapy needs.  She has been doing well from  a stroke standpoint without recurring symptoms or new stroke/TIA symptoms.  She has completed 3 weeks DAPT and continues on Plavix alone without side effects of bleeding or bruising.  Continues on atorvastatin 80 mg daily without side effects myalgias.  Blood pressure has not been being monitored regularly at home. She does have a cuff but unsure on how to use it. She was started on a new medication by PCP today for blood pressure. Denies sleep apnea symptoms.     ROS:   14 system review of systems performed and negative with exception of no complaints  PMH:  Past Medical History:  Diagnosis Date  . Arthritis    "right knee" (05/26/2014)  . Asthma   . Chronic lower back pain   . GERD (gastroesophageal reflux disease)   . HLD (hyperlipidemia)   . Hypertension   . TIA (transient ischemic attack) 04/2018    PSH:  Past Surgical History:  Procedure Laterality Date  . ESOPHAGOGASTRODUODENOSCOPY N/A 05/27/2014   Procedure: ESOPHAGOGASTRODUODENOSCOPY (EGD);  Surgeon: Jeani Hawking, MD;  Location: [facility] ENDOSCOPY;  Service: Endoscopy;  Laterality: N/A;  . FRACTURE SURGERY    . PATELLA FRACTURE SURGERY Right ~ 2009  . TONSILLECTOMY  ~ 1970  . VAGINAL HYSTERECTOMY  1980's    Social History:  Social History   Socioeconomic History  . Marital status: Widowed    Spouse name: Not on file  . Number of children: Not on file  . Years of education: Not on file  . Highest education level: Not on file  Occupational History  .  Not on file  Social Needs  . Financial resource strain: Not on file  . Food insecurity:    Worry: Not on file    Inability: Not on file  . Transportation needs:    Medical: Not on file    Non-medical: Not on file  Tobacco Use  . Smoking status: Former Smoker    Types: Cigarettes  . Smokeless tobacco: Never Used  Substance and Sexual Activity  . Alcohol use: Yes    Alcohol/week: 0.0 standard drinks  . Drug use: No  . Sexual activity: Not Currently  Lifestyle  .  Physical activity:    Days per week: Not on file    Minutes per session: Not on file  . Stress: Not on file  Relationships  . Social connections:    Talks on phone: Not on file    Gets together: Not on file    Attends religious service: Not on file    Active member of club or organization: Not on file    Attends meetings of clubs or organizations: Not on file    Relationship status: Not on file  . Intimate partner violence:    Fear of current or ex partner: Not on file    Emotionally abused: Not on file    Physically abused: Not on file    Forced sexual activity: Not on file  Other Topics Concern  . Not on file  Social History Narrative  . Not on file    Family History:  Family History  Problem Relation Age of Onset  . Hypertension Mother   . Hypertension Father   . Hypertension Sister     Medications:   Current Outpatient Medications on File Prior to Visit  Medication Sig Dispense Refill  . albuterol (PROVENTIL HFA;VENTOLIN HFA) 108 (90 BASE) MCG/ACT inhaler Inhale 2 puffs into the lungs every 2 (two) hours as needed for wheezing or shortness of breath (cough). 1 Inhaler 0  . atorvastatin (LIPITOR) 80 MG tablet Take 1 tablet (80 mg total) by mouth daily at 6 PM. 30 tablet 2  . cholecalciferol (VITAMIN D3) 25 MCG (1000 UT) tablet Take 2,000 Units by mouth daily.    . clopidogrel (PLAVIX) 75 MG tablet Take 1 tablet (75 mg total) by mouth daily. 30 tablet 0  . desloratadine (CLARINEX) 5 MG tablet Take 5 mg by mouth daily.    Marland Kitchen. FLUoxetine (PROZAC) 10 MG capsule Take 10 mg by mouth daily.    . fluticasone (FLONASE) 50 MCG/ACT nasal spray Place 1 spray into both nostrils daily.    . Fluticasone-Salmeterol (ADVAIR) 500-50 MCG/DOSE AEPB Inhale 1 puff into the lungs 2 (two) times daily.    Marland Kitchen. gabapentin (NEURONTIN) 600 MG tablet Take 600 mg by mouth at bedtime.     No current facility-administered medications on file prior to visit.     Allergies:  No Known Allergies    Physical Exam  Depression screen PHQ 2/9 06/15/2018  Decreased Interest 0  Down, Depressed, Hopeless 0  PHQ - 2 Score 0     General: well developed, well nourished, pleasant middle-aged African-American female, seated, in no evident distress Head: head normocephalic and atraumatic.     Neurologic Exam Mental Status: Awake and fully alert. Oriented to place and time. Recent and remote memory intact. Attention span, concentration and fund of knowledge appropriate. Mood and affect appropriate.  Cranial Nerves: Extraocular movements full without nystagmus. Hearing intact to voice. Face, tongue, palate moves normally and symmetrically.  Shoulder shrug  symmetric. Motor: No evidence of weakness project assessment Sensory.:  UTA but subjectively denies temperature variation or numbness/tingling Coordination: Rapid alternating movements normal in all extremities. Finger-to-nose and heel-to-shin performed accurately bilaterally. Gait and Station: Arises from chair without difficulty. Stance is normal. Gait demonstrates normal stride length and balance. Able to heel, toe and tandem walk without difficulty.  Reflexes: UTA   NIHSS  0 Modified Rankin  0    Diagnostic Data (Labs, Imaging, Testing)  CT HEAD WO CONTRAST 05/04/2018 IMPRESSION: No acute finding.ASPECTS is 10.  CT ANGIO HEAD W OR WO CONTRAST CT ANGIO NECK W OR WO CONTRAST 05/04/2018 IMPRESSION: 1. No emergent finding. 2. Cervical and intracranial atherosclerosis without flow limiting stenosis of major vessels. 3. 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 4. Incidental large submandibular gland calculi. 5. Emphysema.  MR BRAIN WO CONTRAST 05/04/2018 IMPRESSION: No acute finding. Chronic small-vessel ischemic changes of the pons. Few old small vessel infarctions of the thalami.  ECHOCARDIOGRAM 05/04/2018 IMPRESSIONS  1. The left ventricle has low normal systolic function, with an ejection  fraction of 50-55%. The cavity size was mildly dilated. Left ventricular diastolic parameters were normal. No evidence of left ventricular regional wall motion abnormalities.  2. The right ventricle has normal systolic function. The cavity was normal. There is no increase in right ventricular wall thickness.  3. The pericardial effusion is posterior to the left ventricle.  4. Trivial pericardial effusion is present.  5. The mitral valve is abnormal. Mild thickening of the mitral valve leaflet. Mild calcification of the mitral valve leaflet.  6. There is a small area of thickening below the mitral valve, on the ventricular side of the anterior leaflet, that may be thickened chordae or degenerative valve.  7. The aortic valve is tricuspid Mild thickening of the aortic valve Mild calcification of the aortic valve. Aortic valve regurgitation is mild by color flow Doppler.     ASSESSMENT: Shannon [name] is a 68 y.o. year old female here with right brain TIA on 05/03/2018 secondary to small vessel disease. Vascular risk factors include HTN, HLD, former tobacco use, EtOH use, advanced age and prior stroke per imaging.  She has been stable from a stroke standpoint without recurring or new stroke/TIA symptoms.    PLAN:  1. Right brain TIA: Continue clopidogrel 75 mg daily  and atorvastatin 80 mg daily for secondary stroke prevention. Maintain strict control of hypertension with blood pressure goal below 130/90, diabetes with hemoglobin A1c goal below 6.5% and cholesterol with LDL cholesterol (bad cholesterol) goal below 70 mg/dL.  I also advised the patient to eat a healthy diet with plenty of whole grains, cereals, fruits and vegetables, exercise regularly with at least 30 minutes of continuous activity daily and maintain ideal body weight. 2. HTN: Started on new antihypertensives today by PCP but patient unable to state what medication was started.  Recommend and educated on importance of monitoring BP  at home especially with change of antihypertensives 3. HLD: Advised to continue current treatment regimen along with continued follow-up with PCP for future prescribing and monitoring of lipid panel 4. Prediabetes: Avoid carbohydrates along with sugars and sweets and attempts to lower A1c by diet alone.  Advised her to continue to follow with PCP for ongoing monitoring     Follow up in 6 months or call earlier if needed   Greater than 50% of time during this 45 minute visit was spent on counseling, explanation of diagnosis of right brain TIA, reviewing  risk factor management of HTN and HLD, planning of further management along with potential future management, and discussion with patient and family answering all questions.    George Hugh, AGNP-BC  [facility] [address] [place], [place] [address]  Phone [phone] Fax [phone] Note: This document was prepared with digital dictation and possible smart phrase technology. Any transcriptional errors that result from this process are unintentional.

## 2018-09-01 ENCOUNTER — Other Ambulatory Visit: Payer: Self-pay | Admitting: Family Medicine

## 2018-09-01 DIAGNOSIS — R220 Localized swelling, mass and lump, head: Secondary | ICD-10-CM

## 2018-09-01 DIAGNOSIS — R221 Localized swelling, mass and lump, neck: Secondary | ICD-10-CM

## 2018-09-09 ENCOUNTER — Other Ambulatory Visit: Payer: Self-pay

## 2018-09-09 ENCOUNTER — Ambulatory Visit
Admission: RE | Admit: 2018-09-09 | Discharge: 2018-09-09 | Disposition: A | Discharge status: Home / Self Care | Payer: Medicare HMO | Source: Ambulatory Visit | Attending: Family Medicine | Admitting: Family Medicine

## 2018-09-09 DIAGNOSIS — R221 Localized swelling, mass and lump, neck: Secondary | ICD-10-CM

## 2018-09-09 DIAGNOSIS — R220 Localized swelling, mass and lump, head: Secondary | ICD-10-CM

## 2018-12-16 ENCOUNTER — Telehealth: Payer: Self-pay | Admitting: Adult Health

## 2018-12-16 ENCOUNTER — Ambulatory Visit: Payer: Medicare HMO | Admitting: Adult Health

## 2018-12-16 NOTE — Telephone Encounter (Signed)
Called the patient to inform her that Janett Billow has an opening on 12/17/18 at 12:45 pm with check in of 12:15 pm. I have left a VM informing the patient to call back if this time would work.   If patient calls back. I had placed this slot on hold to allow time for the patient to call back.

## 2018-12-16 NOTE — Telephone Encounter (Signed)
Pt arrived late to 10/28 appointment and when rescheduling stated she needed to be seen sooner than next available appointment.

## 2018-12-17 ENCOUNTER — Encounter: Payer: Self-pay | Admitting: Adult Health

## 2019-02-03 ENCOUNTER — Other Ambulatory Visit: Payer: Self-pay | Admitting: Family Medicine

## 2019-02-03 DIAGNOSIS — Z1231 Encounter for screening mammogram for malignant neoplasm of breast: Secondary | ICD-10-CM

## 2019-03-24 ENCOUNTER — Ambulatory Visit
Admission: RE | Admit: 2019-03-24 | Discharge: 2019-03-24 | Disposition: A | Discharge status: Home / Self Care | Payer: Medicare HMO | Source: Ambulatory Visit | Attending: Family Medicine | Admitting: Family Medicine

## 2019-03-24 ENCOUNTER — Other Ambulatory Visit: Payer: Self-pay

## 2019-03-24 DIAGNOSIS — Z1231 Encounter for screening mammogram for malignant neoplasm of breast: Secondary | ICD-10-CM

## 2019-06-17 ENCOUNTER — Encounter: Payer: Self-pay | Admitting: Adult Health

## 2019-06-17 ENCOUNTER — Ambulatory Visit: Payer: Medicare HMO | Admitting: Adult Health

## 2019-06-17 ENCOUNTER — Other Ambulatory Visit: Payer: Self-pay

## 2019-06-17 ENCOUNTER — Telehealth: Payer: Self-pay | Admitting: Adult Health

## 2019-06-17 VITALS — BP 150/82 | HR 76 | Temp 97.6°F | Ht 67.0 in | Wt 171.8 lb

## 2019-06-17 DIAGNOSIS — Z8673 Personal history of transient ischemic attack (TIA), and cerebral infarction without residual deficits: Secondary | ICD-10-CM

## 2019-06-17 DIAGNOSIS — E785 Hyperlipidemia, unspecified: Secondary | ICD-10-CM

## 2019-06-17 DIAGNOSIS — R29818 Other symptoms and signs involving the nervous system: Secondary | ICD-10-CM

## 2019-06-17 DIAGNOSIS — R002 Palpitations: Secondary | ICD-10-CM

## 2019-06-17 DIAGNOSIS — R202 Paresthesia of skin: Secondary | ICD-10-CM

## 2019-06-17 DIAGNOSIS — R200 Anesthesia of skin: Secondary | ICD-10-CM

## 2019-06-17 DIAGNOSIS — R208 Other disturbances of skin sensation: Secondary | ICD-10-CM | POA: Diagnosis not present

## 2019-06-17 DIAGNOSIS — I10 Essential (primary) hypertension: Secondary | ICD-10-CM

## 2019-06-17 NOTE — Progress Notes (Signed)
[facility] [address] [place]. [place] [address] [phone]       STROKE FOLLOW UP NOTE  Shannon [name] Date of Birth:  Jul 13, 1950 Medical Record Number:  [id number]   Reason for Referral: stroke follow up   Chief Complaint: Chief Complaint  Patient presents with  . Transient Ischemic Attack    rm 9, FU "this past Sat I got numbness in my left arm and hand, it's better now; on April 11th I had palpitations x 5 hours- saw PCP the next day"    HPI:    Today, 06/17/2019, Shannon [name] returns for TIA follow-up but apparently patient called requesting today's appointment for new onset symptoms.  She states on Saturday, 06/12/2019, she woke up with left arm numbness/tingling and increased weakness with greater weakness distally.  Denies presence of lower extremity or facial numbness/tingling or weakness, speech impairment, imbalance, visual changes or altered mental status.  She does endorse very slight headache upon awakening but shortly subsided.  She denies neck pain or shoulder pain with only slight cramping sensation forearm.  Symptoms have been slowly improving with continued mild numbness/tingling and "it just feels weird".  Of note, history of TIA in 04/2018 with presenting symptom of acute left arm weakness but denies recurrent episodes or symptoms since that time until recently.  Also states on 05/30/2019, she experienced approximately 5 hours of palpitations stating "you can see my heart beating in my chest".  Did not check blood pressure or heart rate at that time.  Does endorse shortness of breath and lightheadedness during that time but denies chest pain or pressure, nausea, left arm symptoms or diaphoresis.  She was seen by her PCP the following day with reported normal EKG and apparently was advised to follow-up with [place] in regards to the symptoms.  Underlying history of palpitations typically short duration occurring more frequently with exertion.  She  has not previously been evaluated by cardiology.  She endorses ongoing compliance with all prescribed medications including clopidogrel and atorvastatin 80 mg daily for secondary stroke prevention.  Blood pressure today 150/82.  No further concerns at this time.     History provided for reference purposes only Initial visit via virtual 06/15/2018: She has been doing well from a stroke standpoint without recurring symptoms or new stroke/TIA symptoms.  She has completed 3 weeks DAPT and continues on Plavix alone without side effects of bleeding or bruising.  Continues on atorvastatin 80 mg daily without side effects myalgias.  Blood pressure has not been being monitored regularly at home. She does have a cuff but unsure on how to use it. She was started on a new medication by PCP today for blood pressure. Denies sleep apnea symptoms.   TIA admission 05/03/2018: Shannon [name] is a 69 y.o. female with history of asthma, HTN, HLD, and chronic back pain who presented to [facility] ED with L arm weakness.  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  CTA head and neck negative for LVO but did show arthrosclerosis, FMD B distal ICA, incidental large submandibular gland cocci and emphysema.  2D echo unremarkable.  LDL 151 and A1c 6.4.  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  Recommended DAPT for 3 weeks and Plavix alone along with increasing home statin dose.  Other stroke risk factors include advanced age, former tobacco use, EtOH use and prior  stroke on imaging.  Discharged home in stable condition without residual deficits without therapy needs.      ROS:   14 system review of systems performed and negative with exception of see HPI  PMH:  Past Medical History:  Diagnosis Date  . Arthritis    "right knee" (05/26/2014)  . Asthma   . Chronic lower back pain   . GERD  (gastroesophageal reflux disease)   . HLD (hyperlipidemia)   . Hypertension   . TIA (transient ischemic attack) 04/2018    PSH:  Past Surgical History:  Procedure Laterality Date  . ESOPHAGOGASTRODUODENOSCOPY N/A 05/27/2014   Procedure: ESOPHAGOGASTRODUODENOSCOPY (EGD);  Surgeon: Jeani Hawking, MD;  Location: [facility] ENDOSCOPY;  Service: Endoscopy;  Laterality: N/A;  . FRACTURE SURGERY    . PATELLA FRACTURE SURGERY Right ~ 2009  . TONSILLECTOMY  ~ 1970  . VAGINAL HYSTERECTOMY  1980's    Social History:  Social History   Socioeconomic History  . Marital status: Widowed    Spouse name: Not on file  . Number of children: Not on file  . Years of education: Not on file  . Highest education level: Not on file  Occupational History  . Not on file  Tobacco Use  . Smoking status: Former Smoker    Types: Cigarettes  . Smokeless tobacco: Never Used  Substance and Sexual Activity  . Alcohol use: Yes    Alcohol/week: 0.0 standard drinks  . Drug use: No  . Sexual activity: Not Currently  Other Topics Concern  . Not on file  Social History Narrative  . Not on file   Social Determinants of Health   Financial Resource Strain:   . Difficulty of Paying Living Expenses:   Food Insecurity:   . Worried About Programme researcher, broadcasting/film/video in the Last Year:   . Barista in the Last Year:   Transportation Needs:   . Freight forwarder (Medical):   Marland Kitchen Lack of Transportation (Non-Medical):   Physical Activity:   . Days of Exercise per Week:   . Minutes of Exercise per Session:   Stress:   . Feeling of Stress :   Social Connections:   . Frequency of Communication with Friends and Family:   . Frequency of Social Gatherings with Friends and Family:   . Attends Religious Services:   . Active Member of Clubs or Organizations:   . Attends Banker Meetings:   Marland Kitchen Marital Status:   Intimate Partner Violence:   . Fear of Current or Ex-Partner:   . Emotionally Abused:   Marland Kitchen Physically  Abused:   . Sexually Abused:     Family History:  Family History  Problem Relation Age of Onset  . Hypertension Mother   . Hypertension Father   . Hypertension Sister     Medications:   Current Outpatient Medications on File Prior to Visit  Medication Sig Dispense Refill  . albuterol (PROVENTIL HFA;VENTOLIN HFA) 108 (90 BASE) MCG/ACT inhaler Inhale 2 puffs into the lungs every 2 (two) hours as needed for wheezing or shortness of breath (cough). 1 Inhaler 0  . atorvastatin (LIPITOR) 80 MG tablet Take 1 tablet (80 mg total) by mouth daily at 6 PM. 30 tablet 2  . cholecalciferol (VITAMIN D3) 25 MCG (1000 UT) tablet Take 2,000 Units by mouth daily.    . clopidogrel (PLAVIX) 75 MG tablet Take 1 tablet (75 mg total) by mouth daily. 30 tablet 0  . desloratadine (CLARINEX) 5 MG tablet  Take 5 mg by mouth daily.    Marland Kitchen FLUoxetine (PROZAC) 10 MG capsule Take 10 mg by mouth daily.    . fluticasone (FLONASE) 50 MCG/ACT nasal spray Place 1 spray into both nostrils daily.    . Fluticasone-Salmeterol (ADVAIR) 500-50 MCG/DOSE AEPB Inhale 1 puff into the lungs 2 (two) times daily.    Marland Kitchen gabapentin (NEURONTIN) 600 MG tablet Take 600 mg by mouth at bedtime.    . pantoprazole (PROTONIX) 20 MG tablet Take 20 mg by mouth daily.    . valsartan (DIOVAN) 80 MG tablet Take 80 mg by mouth daily.     No current facility-administered medications on file prior to visit.    Allergies:   Allergies  Allergen Reactions  . Amoxicillin-Pot Clavulanate Itching      Vitals Today's Vitals   06/17/19 1227  BP: (!) 150/82  Pulse: 76  Temp: 97.6 F (36.4 C)  Weight: 171 lb 12.8 oz (77.9 kg)  Height: 5\' 7"  (1.702 m)   Body mass index is 26.91 kg/m.    Physical Exam  General: well developed, well nourished, pleasant elderly African American female, seated, in no evident distress Head: head normocephalic and atraumatic.   Neck: supple with no carotid or supraclavicular bruits Cardiovascular: regular rate  and rhythm, no murmurs Musculoskeletal: no deformity Skin:  no rash/petichiae Vascular:  Normal pulses all extremities   Neurologic Exam Mental Status: Awake and fully alert.   Fluent speech and language.  Oriented to place and time. Recent and remote memory intact. Attention span, concentration and fund of knowledge appropriate. Mood and affect appropriate.  Cranial Nerves: Pupils equal, briskly reactive to light. Extraocular movements full without nystagmus. Visual fields full to confrontation. Hearing intact. Facial sensation intact. Face, tongue, palate moves normally and symmetrically.  Motor: Normal bulk and tone. Normal strength in all tested extremity muscles except slight 4+/5 left hand grip strength.  Negative pronator drift. Sensory.:  Decreased light touch, pinprick and vibratory sensation LUE compared to RUE.  Intact sensation bilateral lower extremities. Coordination: Rapid alternating movements normal in all extremities. Finger-to-nose and heel-to-shin performed accurately bilaterally. Gait and Station: Arises from chair without difficulty. Stance is normal. Gait demonstrates normal stride length and balance without use of assistive device. Reflexes: 1+ and symmetric. Toes downgoing.       ASSESSMENT: Shannon [name] is a 69 y.o. year old female here with right brain TIA on 05/03/2018 secondary to small vessel disease. Vascular risk factors include HTN, HLD, former tobacco use, EtOH use, advanced age and prior stroke per imaging.  Returned today due to 5-day history of left arm numbness/tingling and weakness upon awakening.  She does endorse gradual improvement but continues to experience decreased sensation and subjective numbness/tingling    PLAN:  1. Left arm symptoms: Positive for decreased sensory and possibly decreased grip strength.  Ddx thalamic stroke vs neuropathy vs nerve impingement vs metabolic etiology.  History of FMD.  Obtain MRI brain to rule out stroke -no  indication for emergent imaging need as symptoms have been improving but advise any new or worsening stroke/TIA symptoms to call 911 immediately for further evaluation.  If imaging unremarkable, may proceed with EMG/NCV for further evaluation.  Will obtain lab work including BMP, TSH, B12, lipid panel and A1c 2. Right brain TIA: Continue clopidogrel 75 mg daily  and atorvastatin 80 mg daily for secondary stroke prevention. Maintain strict control of hypertension with blood pressure goal below 130/90, diabetes with hemoglobin A1c goal below 6.5% and cholesterol  with LDL cholesterol (bad cholesterol) goal below 70 mg/dL.  I also advised the patient to eat a healthy diet with plenty of whole grains, cereals, fruits and vegetables, exercise regularly with at least 30 minutes of continuous activity daily and maintain ideal body weight. 3. Subjective palpitations: Assessment with normal rate and rhythm.  Per patient, evaluation by PCP with normal EKG.  Longstanding history of intermittent palpitations which appear to worsen with exertion.  Advised patient to follow-up with PCP in regards to possibly needing cardiology referral for further evaluation. 4. HLD: Continue atorvastatin 80 mg daily and will obtain lipid panel today to ensure satisfactory management.  Request future follow-up with PCP for ongoing prescribing, monitoring and management 5. HTN: Recommend monitoring at home as well as during episodes of palpitations and ongoing follow-up with PCP for monitoring management    Follow-up in 2 months or call earlier if needed   I spent 40 minutes of face-to-face and non-face-to-face time with patient.  This included previsit chart review, lab review, study review, order entry, electronic health record documentation, patient education regarding new onset symptoms and possible etiologies, prior TIA, importance of managing stroke risk factors and answered all questions to patients satisfaction.     Ihor Austin, AGNP-BC  [facility] [address] [place], [place] [address]  Phone [phone] Fax [phone] Note: This document was prepared with digital dictation and possible smart phrase technology. Any transcriptional errors that result from this process are unintentional.

## 2019-06-17 NOTE — Patient Instructions (Addendum)
In regards to recent onset of symptoms, recommend obtaining MRI to rule out possible stroke.  We will also obtain lab work to look at electrolytes, thyroid function, B12 level as well as cholesterol levels and A1c.  If imaging unremarkable, would recommend EMG/NCV if you continue to experience symptoms for further evaluation  If you develop any worsening or new stroke/TIA symptoms please call 911 immediately for further evaluation  In regards to your palpitations, please speak further with your PCP for possible need of cardiology evaluation and having cardiac monitor for further evaluation  Continue clopidogrel 75 mg daily  and atorvastatin  for secondary stroke prevention  Continue to follow up with PCP regarding cholesterol and blood pressure management   Maintain strict control of hypertension with blood pressure goal below 130/90, diabetes with hemoglobin A1c goal below 6.5% and cholesterol with LDL cholesterol (bad cholesterol) goal below 70 mg/dL. I also advised the patient to eat a healthy diet with plenty of whole grains, cereals, fruits and vegetables, exercise regularly and maintain ideal body weight.  Follow-up in 2 months or call earlier if needed       Thank you for coming to see [place] at [facility]. I hope we have been able to provide you high quality care today.  You may receive a patient satisfaction survey over the next few weeks. We would appreciate your feedback and comments so that we may continue to improve ourselves and the health of our patients.

## 2019-06-17 NOTE — Telephone Encounter (Signed)
[employer] medicare order sent to [facility]. They will obtain the auth and reach out to the patient to schedule.  °

## 2019-06-18 LAB — VITAMIN B12: Vitamin B-12: 439 pg/mL (ref 232–1245)

## 2019-06-18 LAB — LIPID PANEL
Chol/HDL Ratio: 2.7 ratio (ref 0.0–4.4)
Cholesterol, Total: 193 mg/dL (ref 100–199)
HDL: 71 mg/dL (ref >39)
LDL Chol Calc (NIH): 109 mg/dL — ABNORMAL HIGH (ref 0–99)
Triglycerides: 71 mg/dL (ref 0–149)
VLDL Cholesterol Cal: 13 mg/dL (ref 5–40)

## 2019-06-18 LAB — BASIC METABOLIC PANEL
BUN/Creatinine Ratio: 15 (ref 12–28)
BUN: 15 mg/dL (ref 8–27)
CO2: 20 mmol/L (ref 20–29)
Calcium: 9.4 mg/dL (ref 8.7–10.3)
Chloride: 104 mmol/L (ref 96–106)
Creatinine, Ser: 1 mg/dL (ref 0.57–1.00)
GFR calc Af Amer: 66 mL/min/{1.73_m2} (ref >59)
GFR calc non Af Amer: 57 mL/min/{1.73_m2} — ABNORMAL LOW (ref >59)
Glucose: 89 mg/dL (ref 65–99)
Potassium: 4.8 mmol/L (ref 3.5–5.2)
Sodium: 143 mmol/L (ref 134–144)

## 2019-06-18 LAB — TSH: TSH: 0.795 u[IU]/mL (ref 0.450–4.500)

## 2019-06-18 LAB — HEMOGLOBIN A1C
Est. average glucose Bld gHb Est-mCnc: 120 mg/dL
Hgb A1c MFr Bld: 5.8 % — ABNORMAL HIGH (ref 4.8–5.6)

## 2019-06-21 ENCOUNTER — Telehealth: Payer: Self-pay

## 2019-06-21 NOTE — Progress Notes (Signed)
I agree with the above plan 

## 2019-06-21 NOTE — Telephone Encounter (Signed)
Pt verified by name and DOB, results given per provider, pt voiced understanding all question answered. 

## 2019-07-17 ENCOUNTER — Ambulatory Visit
Admission: RE | Admit: 2019-07-17 | Discharge: 2019-07-17 | Disposition: A | Discharge status: Home / Self Care | Payer: Medicare HMO | Source: Ambulatory Visit | Attending: Adult Health | Admitting: Adult Health

## 2019-07-17 ENCOUNTER — Other Ambulatory Visit: Payer: Self-pay

## 2019-07-17 ENCOUNTER — Other Ambulatory Visit: Payer: Medicare HMO

## 2019-07-17 DIAGNOSIS — R208 Other disturbances of skin sensation: Secondary | ICD-10-CM | POA: Diagnosis not present

## 2019-07-17 DIAGNOSIS — R202 Paresthesia of skin: Secondary | ICD-10-CM | POA: Diagnosis not present

## 2019-07-20 ENCOUNTER — Telehealth: Payer: Self-pay

## 2019-07-20 NOTE — Telephone Encounter (Addendum)
Spoke to the patient and notified her of the message below. Pt had no additional questions.  ----- Message from Ihor Austin, NP sent at 07/20/2019  7:36 AM EDT ----- Please advise patient that recent MRI did not show any new findings or abnormalities.  If numbness/tingling is still present, please advise patient to follow-up with PCP for further evaluation

## 2019-07-28 ENCOUNTER — Other Ambulatory Visit: Payer: Self-pay | Admitting: Family Medicine

## 2019-07-28 ENCOUNTER — Telehealth: Payer: Self-pay

## 2019-07-28 DIAGNOSIS — M545 Low back pain, unspecified: Secondary | ICD-10-CM

## 2019-07-28 DIAGNOSIS — M5416 Radiculopathy, lumbar region: Secondary | ICD-10-CM

## 2019-07-28 DIAGNOSIS — R29898 Other symptoms and signs involving the musculoskeletal system: Secondary | ICD-10-CM

## 2019-07-28 NOTE — Telephone Encounter (Signed)
NOTES ON FILE FROM  [facility] [phone], SENT REFERRAL TO SCHEDULING

## 2019-08-09 NOTE — Telephone Encounter (Signed)
Error   This encounter was created in error - please disregard. 

## 2019-08-17 ENCOUNTER — Encounter: Payer: Self-pay | Admitting: Cardiovascular Disease

## 2019-08-17 ENCOUNTER — Other Ambulatory Visit: Payer: Self-pay

## 2019-08-17 ENCOUNTER — Ambulatory Visit (INDEPENDENT_AMBULATORY_CARE_PROVIDER_SITE_OTHER): Payer: Medicare HMO | Admitting: Cardiovascular Disease

## 2019-08-17 VITALS — BP 134/90 | HR 85 | Ht 67.0 in | Wt 173.6 lb

## 2019-08-17 DIAGNOSIS — E7800 Pure hypercholesterolemia, unspecified: Secondary | ICD-10-CM

## 2019-08-17 DIAGNOSIS — R002 Palpitations: Secondary | ICD-10-CM | POA: Diagnosis not present

## 2019-08-17 DIAGNOSIS — I10 Essential (primary) hypertension: Secondary | ICD-10-CM | POA: Diagnosis not present

## 2019-08-17 NOTE — Assessment & Plan Note (Signed)
History of essential hypertension blood pressure measured today 134/90.  She is on valsartan.

## 2019-08-17 NOTE — Assessment & Plan Note (Signed)
History of hyperlipidemia on statin therapy with lipid profile performed 06/17/2019 revealing total cholesterol of 193, LDL 109 and HDL 71.

## 2019-08-17 NOTE — Assessment & Plan Note (Signed)
Shannon [name] was referred to me by Dr. Lazarus Salines for evaluation of palpitations.  She had to get episodes back in April but none since.  She did have a TIA back in 2020 which left her with some left upper extremity numbness which has since resolved.  I am going to get a 2-week Zio patch to rule out occult A. fib.

## 2019-08-17 NOTE — Patient Instructions (Addendum)
Medication Instructions:  NO CHANGE *If you need a refill on your cardiac medications before your next appointment, please call your pharmacy*   Lab Work: If you have labs (blood work) drawn today and your tests are completely normal, you will receive your results only by: Marland Kitchen MyChart Message (if you have MyChart) OR . A paper copy in the mail If you have any lab test that is abnormal or we need to change your treatment, we will call you to review the results  ZIO XT- Long Term Monitor Instructions   Your physician has requested you wear your ZIO patch monitor_14 ______days.   This is a single patch monitor.  [employer] supplies one patch monitor per enrollment.  Additional stickers are not available.   Please do not apply patch if you will be having a Nuclear Stress Test, Echocardiogram, Cardiac CT, MRI, or Chest Xray during the time frame you would be wearing the monitor. The patch cannot be worn during these tests.  You cannot remove and re-apply the ZIO XT patch monitor.   Your ZIO patch monitor will be sent USPS Priority mail from [facility] directly to your home address. The monitor may also be mailed to a PO BOX if home delivery is not available.   It may take 3-5 days to receive your monitor after you have been enrolled.   Once you have received you monitor, please review enclosed instructions.  Your monitor has already been registered assigning a specific monitor serial # to you.   Applying the monitor   Shave hair from upper left chest.   Hold abrader disc by orange tab.  Rub abrader in 40 strokes over left upper chest as indicated in your monitor instructions.   Clean area with 4 enclosed alcohol pads .  Use all pads to assure are is cleaned thoroughly.  Let dry.   Apply patch as indicated in monitor instructions.  Patch will be place under collarbone on left side of chest with arrow pointing upward.   Rub patch adhesive wings for 2 minutes.Remove white label marked  "1".  Remove white label marked "2".  Rub patch adhesive wings for 2 additional minutes.   While looking in a mirror, press and release button in center of patch.  A small green light will flash 3-4 times .  This will be your only indicator the monitor has been turned on.     Do not shower for the first 24 hours.  You may shower after the first 24 hours.   Press button if you feel a symptom. You will hear a small click.  Record Date, Time and Symptom in the Patient Log Book.   When you are ready to remove patch, follow instructions on last 2 pages of Patient Log Book.  Stick patch monitor onto last page of Patient Log Book.   Place Patient Log Book in [place] box.  Use locking tab on box and tape box closed securely.  The [employer] and [employer] has [employer] on it.  Please place in mailbox as soon as possible.  Your physician should have your test results approximately 7 days after the monitor has been mailed back to [facility]   Call [facility] Customer Care at [phone] if you have questions regarding your ZIO XT patch monitor.  Call them immediately if you see an orange light blinking on your monitor.   If your monitor falls off in less than 4 days contact our Monitor department at [phone].  If your monitor becomes loose or falls off after 4 days call [employer] at [phone] for suggestions on securing your monitor.     Follow-Up: At [facility], you and your health needs are our priority.  As part of our continuing mission to provide you with exceptional heart care, we have created designated Provider Care Teams.  These Care Teams include your primary Cardiologist (physician) and Advanced Practice Providers (APPs -  Physician Assistants and Nurse Practitioners) who all work together to provide you with the care you need, when you need it.  We recommend signing up for the patient portal called "MyChart".  Sign up information is provided on this After Visit Summary.   MyChart is used to connect with patients for Virtual Visits (Telemedicine).  Patients are able to view lab/test results, encounter notes, upcoming appointments, etc.  Non-urgent messages can be sent to your provider as well.   To learn more about what you can do with MyChart, go to [email or web address].    Your next appointment:   6 month(s)  The format for your next appointment:   In Person  Provider:   Nanetta Batty MD

## 2019-08-17 NOTE — Progress Notes (Signed)
08/17/2019 Elmer Ramp   1950-04-10  [id number]  Primary Physician Medicine, [facility] Primary Cardiologist: Runell Gess MD Milagros Loll, [place], [place]  HPI:  Shannon [name] is a 69 y.o. mildly overweight widowed African-American female mother of 2 children, grandmother of 10 grandchildren referred to me by Dr. Lazarus Salines, her PCP, for evaluation of palpitations.  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  She smoked remotely and stopped 20 years ago.  She does have treated hypertension and hyperlipidemia.  There is no family history for heart disease.  She is never had a heart attack but did have a TIA back in 2020.  She denies chest pain but does get dyspneic on exertion.  She has reactive airways disease on bronchodilators.  She had 2 episodes of tachypalpitations back in April but none since.  She did have a 2D echo performed 05/04/2018 which was essentially normal.   Current Meds  Medication Sig  . albuterol (PROVENTIL HFA;VENTOLIN HFA) 108 (90 BASE) MCG/ACT inhaler Inhale 2 puffs into the lungs every 2 (two) hours as needed for wheezing or shortness of breath (cough).  Marland Kitchen atorvastatin (LIPITOR) 80 MG tablet Take 1 tablet (80 mg total) by mouth daily at 6 PM.  . cholecalciferol (VITAMIN D3) 25 MCG (1000 UT) tablet Take 2,000 Units by mouth daily.  . clopidogrel (PLAVIX) 75 MG tablet Take 1 tablet (75 mg total) by mouth daily.  Marland Kitchen desloratadine (CLARINEX) 5 MG tablet Take 5 mg by mouth daily.  Marland Kitchen FLUoxetine (PROZAC) 10 MG capsule Take 10 mg by mouth daily.  . fluticasone (FLONASE) 50 MCG/ACT nasal spray Place 1 spray into both nostrils daily.  . Fluticasone-Salmeterol (ADVAIR) 500-50 MCG/DOSE AEPB Inhale 1 puff into the lungs 2 (two) times daily.  Marland Kitchen gabapentin (NEURONTIN) 600 MG tablet Take 600 mg by mouth at bedtime.  . pantoprazole (PROTONIX) 20 MG tablet Take 20 mg by mouth daily.  . valsartan (DIOVAN) 80 MG tablet Take 80 mg by mouth daily.     Allergies  Allergen Reactions  .  Amoxicillin-Pot Clavulanate Itching    Social History   Socioeconomic History  . Marital status: Widowed    Spouse name: Not on file  . Number of children: Not on file  . Years of education: Not on file  . Highest education level: Not on file  Occupational History  . Not on file  Tobacco Use  . Smoking status: Former Smoker    Types: Cigarettes  . Smokeless tobacco: Never Used  Vaping Use  . Vaping Use: Never used  Substance and Sexual Activity  . Alcohol use: Yes    Alcohol/week: 0.0 standard drinks  . Drug use: No  . Sexual activity: Not Currently  Other Topics Concern  . Not on file  Social History Narrative  . Not on file   Social Determinants of Health   Financial Resource Strain:   . Difficulty of Paying Living Expenses:   Food Insecurity:   . Worried About Programme researcher, broadcasting/film/video in the Last Year:   . Barista in the Last Year:   Transportation Needs:   . Freight forwarder (Medical):   Marland Kitchen Lack of Transportation (Non-Medical):   Physical Activity:   . Days of Exercise per Week:   . Minutes of Exercise per Session:   Stress:   . Feeling of Stress :   Social Connections:   . Frequency of Communication with Friends and Family:   . Frequency  of Social Gatherings with Friends and Family:   . Attends Religious Services:   . Active Member of Clubs or Organizations:   . Attends Banker Meetings:   Marland Kitchen Marital Status:   Intimate Partner Violence:   . Fear of Current or Ex-Partner:   . Emotionally Abused:   Marland Kitchen Physically Abused:   . Sexually Abused:      Review of Systems: General: negative for chills, fever, night sweats or weight changes.  Cardiovascular: negative for chest pain, dyspnea on exertion, edema, orthopnea, palpitations, paroxysmal nocturnal dyspnea or shortness of breath Dermatological: negative for rash Respiratory: negative for cough or wheezing Urologic: negative for hematuria Abdominal: negative for nausea, vomiting,  diarrhea, bright red blood per rectum, melena, or hematemesis Neurologic: negative for visual changes, syncope, or dizziness All other systems reviewed and are otherwise negative except as noted above.    Blood pressure 134/90, pulse 85, height 5\' 7"  (1.702 m), weight 173 lb 9.6 oz (78.7 kg), SpO2 94 %.  General appearance: alert and no distress Neck: no adenopathy, no carotid bruit, no JVD, supple, symmetrical, trachea midline and thyroid not enlarged, symmetric, no tenderness/mass/nodules Lungs: clear to auscultation bilaterally Heart: regular rate and rhythm, S1, S2 normal, no murmur, click, rub or gallop Extremities: extremities normal, atraumatic, no cyanosis or edema Pulses: 2+ and symmetric Skin: Skin color, texture, turgor normal. No rashes or lesions Neurologic: Alert and oriented X 3, normal strength and tone. Normal symmetric reflexes. Normal coordination and gait  EKG sinus rhythm at 85 with septal Q waves and nonspecific ST and T wave changes.  I personally reviewed this EKG.  ASSESSMENT AND PLAN:   HYPERCHOLESTEROLEMIA History of hyperlipidemia on statin therapy with lipid profile performed 06/17/2019 revealing total cholesterol of 193, LDL 109 and HDL 71.  HYPERTENSION, BENIGN SYSTEMIC History of essential hypertension blood pressure measured today 134/90.  She is on valsartan.  Palpitations Ms. Gautier was referred to me by Dr. Clinton Sawyer for evaluation of palpitations.  She had to get episodes back in April but none since.  She did have a TIA back in 2020 which left her with some left upper extremity numbness which has since resolved.  I am going to get a 2-week Zio patch to rule out occult A. fib.      2021 MD FACP,FACC,FAHA, [facility] 08/17/2019 3:26 PM

## 2019-08-18 ENCOUNTER — Encounter: Payer: Self-pay | Admitting: *Deleted

## 2019-08-18 NOTE — Progress Notes (Signed)
Patient ID: Shannon [name], female   DOB: 01/07/1951, 69 y.o.   MRN: [id number] Patient enrolled for [employer] to ship a 14 day ZIO XT long term holter monitor to her home.

## 2019-08-27 ENCOUNTER — Other Ambulatory Visit: Payer: Self-pay | Admitting: Family Medicine

## 2019-08-30 ENCOUNTER — Telehealth: Payer: Self-pay

## 2019-08-30 ENCOUNTER — Ambulatory Visit: Payer: Medicare HMO | Admitting: Adult Health

## 2019-08-30 ENCOUNTER — Encounter: Payer: Self-pay | Admitting: Adult Health

## 2019-08-30 NOTE — Telephone Encounter (Signed)
No show appt

## 2019-08-30 NOTE — Telephone Encounter (Signed)
Pt called and left VM at 8:29am to let us know that she will not be at her appt today.

## 2019-08-31 ENCOUNTER — Other Ambulatory Visit: Payer: Medicare HMO

## 2019-09-03 ENCOUNTER — Ambulatory Visit (INDEPENDENT_AMBULATORY_CARE_PROVIDER_SITE_OTHER): Payer: Medicare HMO

## 2019-09-03 DIAGNOSIS — R002 Palpitations: Secondary | ICD-10-CM

## 2020-02-04 ENCOUNTER — Ambulatory Visit: Payer: Medicare HMO | Admitting: Cardiovascular Disease

## 2020-03-01 ENCOUNTER — Other Ambulatory Visit: Payer: Self-pay | Admitting: Family Medicine

## 2020-03-01 DIAGNOSIS — Z1231 Encounter for screening mammogram for malignant neoplasm of breast: Secondary | ICD-10-CM

## 2020-03-29 ENCOUNTER — Encounter: Payer: Self-pay | Admitting: Cardiovascular Disease

## 2020-03-29 ENCOUNTER — Other Ambulatory Visit: Payer: Self-pay

## 2020-03-29 ENCOUNTER — Ambulatory Visit (INDEPENDENT_AMBULATORY_CARE_PROVIDER_SITE_OTHER): Payer: Medicare HMO | Admitting: Cardiovascular Disease

## 2020-03-29 DIAGNOSIS — I10 Essential (primary) hypertension: Secondary | ICD-10-CM

## 2020-03-29 DIAGNOSIS — E7800 Pure hypercholesterolemia, unspecified: Secondary | ICD-10-CM | POA: Diagnosis not present

## 2020-03-29 DIAGNOSIS — R002 Palpitations: Secondary | ICD-10-CM | POA: Diagnosis not present

## 2020-03-29 DIAGNOSIS — G4733 Obstructive sleep apnea (adult) (pediatric): Secondary | ICD-10-CM | POA: Diagnosis not present

## 2020-03-29 NOTE — Patient Instructions (Signed)
Medication Instructions:  Your physician recommends that you continue on your current medications as directed. Please refer to the Current Medication list given to you today.  *If you need a refill on your cardiac medications before your next appointment, please call your pharmacy*   Testing/Procedures: Dr. Allyson Sabal has ordered a CT coronary calcium score. This test is done at [address]. This is $99 out of pocket.   Coronary CalciumScan A coronary calcium scan is an imaging test used to look for deposits of calcium and other fatty materials (plaques) in the inner lining of the blood vessels of the heart (coronary arteries). These deposits of calcium and plaques can partly clog and narrow the coronary arteries without producing any symptoms or warning signs. This puts a person at risk for a heart attack. This test can detect these deposits before symptoms develop. Tell a health care provider about:  Any allergies you have.  All medicines you are taking, including vitamins, herbs, eye drops, creams, and over-the-counter medicines.  Any problems you or family members have had with anesthetic medicines.  Any blood disorders you have.  Any surgeries you have had.  Any medical conditions you have.  Whether you are pregnant or may be pregnant. What are the risks? Generally, this is a safe procedure. However, problems may occur, including:  Harm to a pregnant woman and her unborn baby. This test involves the use of radiation. Radiation exposure can be dangerous to a pregnant woman and her unborn baby. If you are pregnant, you generally should not have this procedure done.  Slight increase in the risk of cancer. This is because of the radiation involved in the test. What happens before the procedure? No preparation is needed for this procedure. What happens during the procedure?  You will undress and remove any jewelry around your neck or chest.  You will put on a  hospital gown.  Sticky electrodes will be placed on your chest. The electrodes will be connected to an electrocardiogram (ECG) machine to record a tracing of the electrical activity of your heart.  A CT scanner will take pictures of your heart. During this time, you will be asked to lie still and hold your breath for 2-3 seconds while a picture of your heart is being taken. The procedure may vary among health care providers and hospitals. What happens after the procedure?  You can get dressed.  You can return to your normal activities.  It is up to you to get the results of your test. Ask your health care provider, or the department that is doing the test, when your results will be ready. Summary  A coronary calcium scan is an imaging test used to look for deposits of calcium and other fatty materials (plaques) in the inner lining of the blood vessels of the heart (coronary arteries).  Generally, this is a safe procedure. Tell your health care provider if you are pregnant or may be pregnant.  No preparation is needed for this procedure.  A CT scanner will take pictures of your heart.  You can return to your normal activities after the scan is done. This information is not intended to replace advice given to you by your health care provider. Make sure you discuss any questions you have with your health care provider. Document Released: 08/03/2007 Document Revised: 12/25/2015 Document Reviewed: 12/25/2015 Elsevier Interactive Patient Education  2017 [employer].  Your physician has recommended that you have a sleep study. This test  records several body functions during sleep, including: brain activity, eye movement, oxygen and carbon dioxide blood levels, heart rate and rhythm, breathing rate and rhythm, the flow of air through your mouth and nose, snoring, body muscle movements, and chest and belly movement.    Follow-Up: At [facility], you and your health needs are our priority.   As part of our continuing mission to provide you with exceptional heart care, we have created designated Provider Care Teams.  These Care Teams include your primary Cardiologist (physician) and Advanced Practice Providers (APPs -  Physician Assistants and Nurse Practitioners) who all work together to provide you with the care you need, when you need it.  We recommend signing up for the patient portal called "MyChart".  Sign up information is provided on this After Visit Summary.  MyChart is used to connect with patients for Virtual Visits (Telemedicine).  Patients are able to view lab/test results, encounter notes, upcoming appointments, etc.  Non-urgent messages can be sent to your provider as well.   To learn more about what you can do with MyChart, go to [email or web address].    Your next appointment:   12 month(s)  The format for your next appointment:   In Person  Provider:   Nanetta Batty, MD

## 2020-03-29 NOTE — Assessment & Plan Note (Signed)
Patient is moderately overweight with a BMI of 27.  She does have nocturnal snoring and daytime somnolence.  I suspect she has obstructive sleep apnea which may be also contributing to her palpitations.  I am going to get a sleep study to further evaluate.

## 2020-03-29 NOTE — Assessment & Plan Note (Signed)
History of hyperlipidemia on statin therapy with lipid profile performed 06/17/2019 revealing total cholesterol 193, LDL 109 and HDL of 71.  She is not quite at goal for primary prevention on maximum doses of atorvastatin.  I am going to get a coronary calcium score to risk stratify.

## 2020-03-29 NOTE — Assessment & Plan Note (Signed)
History of essential hypertension a blood pressure measured today 142/86.  She is on valsartan.

## 2020-03-29 NOTE — Assessment & Plan Note (Signed)
History of palpitations with event monitor that showed PACs, PVCs and short runs of SVT and nonsustained ventricular tachycardia performed 09/03/2019.  She does have normal LV function by 2D echocardiography.  She says she is only had 2 episodes of palpitations over the last 8 months however.

## 2020-03-29 NOTE — Progress Notes (Signed)
03/29/2020 Elmer Ramp   09-Mar-1950  [id number]  Primary Physician Medicine, [facility] Primary Cardiologist: Runell Gess MD Milagros Loll, [place], [place]  HPI:  Shannon [name] is a 70 y.o.  mildly overweight widowed African-American female mother of 2 children, grandmother of 10 grandchildren referred to me by Dr. Lazarus Salines, her PCP, for evaluation of palpitations.    I last saw her in the office 08/17/2019.  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  She smoked remotely and stopped 20 years ago.  She does have treated hypertension and hyperlipidemia.  There is no family history for heart disease.  She is never had a heart attack but did have a TIA back in 2020.  She denies chest pain but does get dyspneic on exertion.  She has reactive airways disease on bronchodilators.  She had 2 episodes of tachypalpitations back in April but none since.  She did have a 2D echo performed 05/04/2018 which was essentially normal.  Since I saw her in the office she did have an event monitor performed 09/03/2019 that showed occasional PACs/PVCs with short runs of PSVT and NSVT.  She is really had only 2 episodes of tachypalpitations the last 8 months.  She does plan of nocturnal snoring and daytime somnolence.  And has moderate obesity.  She may have obstructive sleep apnea contributing to this as well as her palpitations.   Current Meds  Medication Sig  . albuterol (PROVENTIL HFA;VENTOLIN HFA) 108 (90 BASE) MCG/ACT inhaler Inhale 2 puffs into the lungs every 2 (two) hours as needed for wheezing or shortness of breath (cough).  Marland Kitchen atorvastatin (LIPITOR) 80 MG tablet Take 1 tablet (80 mg total) by mouth daily at 6 PM.  . cholecalciferol (VITAMIN D3) 25 MCG (1000 UT) tablet Take 2,000 Units by mouth daily.  . clopidogrel (PLAVIX) 75 MG tablet Take 1 tablet (75 mg total) by mouth daily.  Marland Kitchen desloratadine (CLARINEX) 5 MG tablet Take 5 mg by mouth daily.  Marland Kitchen FLUoxetine (PROZAC) 10 MG capsule Take 10 mg by mouth daily.   . fluticasone (FLONASE) 50 MCG/ACT nasal spray Place 1 spray into both nostrils daily.  . Fluticasone-Salmeterol (ADVAIR) 500-50 MCG/DOSE AEPB Inhale 1 puff into the lungs 2 (two) times daily.  Marland Kitchen gabapentin (NEURONTIN) 600 MG tablet Take 600 mg by mouth 3 (three) times daily.  . pantoprazole (PROTONIX) 20 MG tablet Take 20 mg by mouth daily.  . valsartan (DIOVAN) 80 MG tablet Take 80 mg by mouth daily.     Allergies  Allergen Reactions  . Amoxicillin-Pot Clavulanate Itching    Social History   Socioeconomic History  . Marital status: Widowed    Spouse name: Not on file  . Number of children: Not on file  . Years of education: Not on file  . Highest education level: Not on file  Occupational History  . Not on file  Tobacco Use  . Smoking status: Former Smoker    Types: Cigarettes  . Smokeless tobacco: Never Used  Vaping Use  . Vaping Use: Never used  Substance and Sexual Activity  . Alcohol use: Yes    Alcohol/week: 0.0 standard drinks  . Drug use: No  . Sexual activity: Not Currently  Other Topics Concern  . Not on file  Social History Narrative  . Not on file   Social Determinants of Health   Financial Resource Strain: Not on file  Food Insecurity: Not on file  Transportation Needs: Not on file  Physical  Activity: Not on file  Stress: Not on file  Social Connections: Not on file  Intimate Partner Violence: Not on file     Review of Systems: General: negative for chills, fever, night sweats or weight changes.  Cardiovascular: negative for chest pain, dyspnea on exertion, edema, orthopnea, palpitations, paroxysmal nocturnal dyspnea or shortness of breath Dermatological: negative for rash Respiratory: negative for cough or wheezing Urologic: negative for hematuria Abdominal: negative for nausea, vomiting, diarrhea, bright red blood per rectum, melena, or hematemesis Neurologic: negative for visual changes, syncope, or dizziness All other systems reviewed and  are otherwise negative except as noted above.    Blood pressure (!) 142/86, pulse 81, height 5\' 7"  (1.702 m), weight 175 lb 12.8 oz (79.7 kg).  General appearance: alert and no distress Neck: no adenopathy, no carotid bruit, no JVD, supple, symmetrical, trachea midline and thyroid not enlarged, symmetric, no tenderness/mass/nodules Lungs: clear to auscultation bilaterally Heart: regular rate and rhythm, S1, S2 normal, no murmur, click, rub or gallop Extremities: extremities normal, atraumatic, no cyanosis or edema Pulses: 2+ and symmetric Skin: Skin color, texture, turgor normal. No rashes or lesions Neurologic: Alert and oriented X 3, normal strength and tone. Normal symmetric reflexes. Normal coordination and gait  EKG sinus rhythm 81 with frequent PVCs.  ASSESSMENT AND PLAN:   HYPERCHOLESTEROLEMIA History of hyperlipidemia on statin therapy with lipid profile performed 06/17/2019 revealing total cholesterol 193, LDL 109 and HDL of 71.  She is not quite at goal for primary prevention on maximum doses of atorvastatin.  I am going to get a coronary calcium score to risk stratify.  HYPERTENSION, BENIGN SYSTEMIC History of essential hypertension a blood pressure measured today 142/86.  She is on valsartan.  Palpitations History of palpitations with event monitor that showed PACs, PVCs and short runs of SVT and nonsustained ventricular tachycardia performed 09/03/2019.  She does have normal LV function by 2D echocardiography.  She says she is only had 2 episodes of palpitations over the last 8 months however.  Obstructive sleep apnea Patient is moderately overweight with a BMI of 27.  She does have nocturnal snoring and daytime somnolence.  I suspect she has obstructive sleep apnea which may be also contributing to her palpitations.  I am going to get a sleep study to further evaluate.      09/05/2019 MD FACP,FACC,FAHA, [facility] 03/29/2020 11:28 AM

## 2020-03-30 ENCOUNTER — Telehealth: Payer: Self-pay | Admitting: *Deleted

## 2020-03-30 NOTE — Telephone Encounter (Signed)
Left sleep study appointment details and contact information on VM.

## 2020-04-12 ENCOUNTER — Other Ambulatory Visit: Payer: Self-pay

## 2020-04-12 ENCOUNTER — Ambulatory Visit
Admission: RE | Admit: 2020-04-12 | Discharge: 2020-04-12 | Disposition: A | Discharge status: Home / Self Care | Payer: Medicare HMO | Source: Ambulatory Visit | Attending: Family Medicine | Admitting: Family Medicine

## 2020-04-12 ENCOUNTER — Ambulatory Visit: Payer: Medicare HMO

## 2020-04-12 DIAGNOSIS — Z1231 Encounter for screening mammogram for malignant neoplasm of breast: Secondary | ICD-10-CM

## 2020-04-21 ENCOUNTER — Ambulatory Visit (INDEPENDENT_AMBULATORY_CARE_PROVIDER_SITE_OTHER)
Admission: RE | Admit: 2020-04-21 | Discharge: 2020-04-21 | Disposition: A | Discharge status: Home / Self Care | Payer: Self-pay | Source: Ambulatory Visit | Attending: Cardiovascular Disease | Admitting: Cardiovascular Disease

## 2020-04-21 ENCOUNTER — Other Ambulatory Visit: Payer: Self-pay

## 2020-04-21 DIAGNOSIS — I10 Essential (primary) hypertension: Secondary | ICD-10-CM

## 2020-04-21 DIAGNOSIS — E7800 Pure hypercholesterolemia, unspecified: Secondary | ICD-10-CM

## 2020-05-03 ENCOUNTER — Telehealth: Payer: Self-pay

## 2020-05-03 DIAGNOSIS — E7800 Pure hypercholesterolemia, unspecified: Secondary | ICD-10-CM

## 2020-05-03 NOTE — Telephone Encounter (Signed)
-----   Message from Runell Gess, MD sent at 04/21/2020  5:03 PM EST ----- Elevated coronary calcium score of 507 on high-dose atorvastatin not at goal for secondary prevention.  Refer to lipid clinic for further evaluation and treatment.

## 2020-05-03 NOTE — Telephone Encounter (Signed)
Called pt regarding coronary calcium score. Left message for pt to call back. Will proceed with placing order for referral to lipid clinic per Dr. Allyson Sabal.

## 2020-05-04 NOTE — Telephone Encounter (Signed)
Pt aware of recommendations and agrees with plan ./cy 

## 2020-05-04 NOTE — Telephone Encounter (Signed)
Patient is returning phone call.  °

## 2020-05-16 ENCOUNTER — Other Ambulatory Visit: Payer: Self-pay

## 2020-05-16 ENCOUNTER — Ambulatory Visit (HOSPITAL_BASED_OUTPATIENT_CLINIC_OR_DEPARTMENT_OTHER): Payer: Medicare HMO | Attending: Cardiovascular Disease | Admitting: Cardiovascular Disease

## 2020-05-16 DIAGNOSIS — G478 Other sleep disorders: Secondary | ICD-10-CM

## 2020-05-16 DIAGNOSIS — R0902 Hypoxemia: Secondary | ICD-10-CM | POA: Insufficient documentation

## 2020-05-16 DIAGNOSIS — G4736 Sleep related hypoventilation in conditions classified elsewhere: Secondary | ICD-10-CM | POA: Diagnosis not present

## 2020-05-16 DIAGNOSIS — G4733 Obstructive sleep apnea (adult) (pediatric): Secondary | ICD-10-CM

## 2020-06-02 ENCOUNTER — Encounter: Payer: Self-pay | Admitting: Internal Medicine

## 2020-06-02 ENCOUNTER — Telehealth (INDEPENDENT_AMBULATORY_CARE_PROVIDER_SITE_OTHER): Payer: Medicare HMO | Admitting: Internal Medicine

## 2020-06-02 VITALS — Wt 179.0 lb

## 2020-06-02 DIAGNOSIS — Z8673 Personal history of transient ischemic attack (TIA), and cerebral infarction without residual deficits: Secondary | ICD-10-CM | POA: Diagnosis not present

## 2020-06-02 DIAGNOSIS — I359 Nonrheumatic aortic valve disorder, unspecified: Secondary | ICD-10-CM | POA: Diagnosis not present

## 2020-06-02 DIAGNOSIS — E785 Hyperlipidemia, unspecified: Secondary | ICD-10-CM | POA: Diagnosis not present

## 2020-06-02 DIAGNOSIS — I2584 Coronary atherosclerosis due to calcified coronary lesion: Secondary | ICD-10-CM

## 2020-06-02 DIAGNOSIS — I2510 Atherosclerotic heart disease of native coronary artery without angina pectoris: Secondary | ICD-10-CM | POA: Diagnosis not present

## 2020-06-02 MED ORDER — EZETIMIBE 10 MG PO TABS: 10.0000 mg | ORAL_TABLET | Freq: Every day | ORAL | 3 refills | Status: DC

## 2020-06-02 NOTE — Progress Notes (Signed)
Virtual Visit via Video Note   This visit type was conducted due to national recommendations for restrictions regarding the COVID-19 Pandemic (e.g. social distancing) in an effort to limit this patient's exposure and mitigate transmission in our community.  Due to her co-morbid illnesses, this patient is at least at moderate risk for complications without adequate follow up.  This format is felt to be most appropriate for this patient at this time.  All issues noted in this document were discussed and addressed.  A limited physical exam was performed with this format.  Please refer to the patient's chart for her consent to telehealth for [facility].      Date:  06/02/2020   ID:  Shannon [name], DOB [birth year], MRN [id number] The patient was identified using 2 identifiers.  Evaluation Performed:  New Patient Evaluation  Patient Location:  [address] [place] [address]  Provider location:   [address] [place], [place] [address]  PCP:  Sherral Hammers, FNP  Cardiologist:  No primary care provider on file. Electrophysiologist:  None   Chief Complaint:  Manage dyslipidemia  History of Present Illness:    Shannon [name] is a 70 y.o. female who presents via audio/video conferencing for a telehealth visit today.  This is a pleasant 70 year old female patient of Dr. Allyson Sabal who was kindly referred for evaluation and management of dyslipidemia.  She had a recent coronary calcium score performed which was 507, 94th percentile for age and sex matched control.  She was also noted to have aortic atherosclerosis as well as a heavily calcified aortic valve.  Based on this she should have a desired LDL cholesterol below 70.  Most recently her labs 11 months ago showed total cholesterol 193, triglycerides 71, HDL 71 and LDL 109.  Although she has a high HDL cholesterol, she has obviously developed progressive coronary disease.  She also has a reported history of TIA  and again her target LDL should be much lower.  Fortunately, she has been tolerant of statin and is on atorvastatin 80 mg.  She has not tried other therapies.  The patient does not have symptoms concerning for COVID-19 infection (fever, chills, cough, or new SHORTNESS OF BREATH).    Prior CV studies:   The following studies were reviewed today:  Chart reviewed, lab work  PMHx:  Past Medical History:  Diagnosis Date  . Arthritis    "right knee" (05/26/2014)  . Asthma   . Chronic lower back pain   . GERD (gastroesophageal reflux disease)   . HLD (hyperlipidemia)   . Hypertension   . TIA (transient ischemic attack) 04/2018    Past Surgical History:  Procedure Laterality Date  . ESOPHAGOGASTRODUODENOSCOPY N/A 05/27/2014   Procedure: ESOPHAGOGASTRODUODENOSCOPY (EGD);  Surgeon: Jeani Hawking, MD;  Location: [facility] ENDOSCOPY;  Service: Endoscopy;  Laterality: N/A;  . FRACTURE SURGERY    . PATELLA FRACTURE SURGERY Right ~ 2009  . TONSILLECTOMY  ~ 1970  . VAGINAL HYSTERECTOMY  1980's    FAMHx:  Family History  Problem Relation Age of Onset  . Hypertension Mother   . Hypertension Father   . Hypertension Sister     SOCHx:   reports that she has quit smoking. Her smoking use included cigarettes. She has never used smokeless tobacco. She reports current alcohol use. She reports that she does not use drugs.  ALLERGIES:  Allergies  Allergen Reactions  . Amoxicillin-Pot Clavulanate Itching    MEDS:  Current Meds  Medication  Sig  . albuterol (PROVENTIL HFA;VENTOLIN HFA) 108 (90 BASE) MCG/ACT inhaler Inhale 2 puffs into the lungs every 2 (two) hours as needed for wheezing or shortness of breath (cough).  Marland Kitchen atorvastatin (LIPITOR) 80 MG tablet Take 1 tablet (80 mg total) by mouth daily at 6 PM.  . busPIRone (BUSPAR) 10 MG tablet Take 10 mg by mouth 2 (two) times daily.  . cholecalciferol (VITAMIN D3) 25 MCG (1000 UT) tablet Take 2,000 Units by mouth daily.  . clopidogrel (PLAVIX) 75 MG  tablet Take 1 tablet (75 mg total) by mouth daily.  Marland Kitchen desloratadine (CLARINEX) 5 MG tablet Take 5 mg by mouth daily.  Marland Kitchen ezetimibe (ZETIA) 10 MG tablet Take 1 tablet (10 mg total) by mouth daily.  Marland Kitchen FLUoxetine (PROZAC) 20 MG capsule Take 20 mg by mouth daily.  . fluticasone (FLONASE) 50 MCG/ACT nasal spray Place 1 spray into both nostrils daily.  . Fluticasone-Salmeterol (ADVAIR) 500-50 MCG/DOSE AEPB Inhale 1 puff into the lungs 2 (two) times daily.  Marland Kitchen gabapentin (NEURONTIN) 600 MG tablet Take 600 mg by mouth 3 (three) times daily.  . pantoprazole (PROTONIX) 20 MG tablet Take 20 mg by mouth daily.  . valsartan (DIOVAN) 80 MG tablet Take 80 mg by mouth daily.  . [DISCONTINUED] FLUoxetine (PROZAC) 10 MG capsule Take 10 mg by mouth daily.     ROS: Pertinent items noted in HPI and remainder of comprehensive ROS otherwise negative.  Labs/Other Tests and Data Reviewed:    Recent Labs: 06/17/2019: BUN 15; Creatinine, Ser 1.00; Potassium 4.8; Sodium 143; TSH 0.795   Recent Lipid Panel Lab Results  Component Value Date/Time   CHOL 193 06/17/2019 01:19 PM   TRIG 71 06/17/2019 01:19 PM   HDL 71 06/17/2019 01:19 PM   CHOLHDL 2.7 06/17/2019 01:19 PM   CHOLHDL 3.2 05/04/2018 10:30 AM   LDLCALC 109 (H) 06/17/2019 01:19 PM    Wt Readings from Last 3 Encounters:  06/02/20 179 lb (81.2 kg)  05/16/20 175 lb (79.4 kg)  03/29/20 175 lb 12.8 oz (79.7 kg)     Exam:    Vital Signs:  Wt 179 lb (81.2 kg)   BMI 28.04 kg/m    General appearance: alert and no distress Lungs: No visual respiratory difficulty Abdomen: Mildly overweight Extremities: extremities normal, atraumatic, no cyanosis or edema Skin: Skin color, texture, turgor normal. No rashes or lesions Neurologic: Grossly normal Psych: Pleasant  ASSESSMENT & PLAN:    1. Mixed dyslipidemia, goal LDL less than 70 2. High CAC score of 507, 94th percentile, aortic valve calcification 3. History of TIA  Shannon [name] has a mixed  dyslipidemia with a target LDL less than 70 but has not been able to achieve that with high potency atorvastatin.  It does sound like she has somewhat of an atherogenic diet with a number of fried foods which she said she could improve on.  I think that would be helpful but she will likely need additional therapy to reach target.  I would recommend adding ezetimibe 10 mg daily to her regimen.  We will repeat lipids in 3 months and follow-up with me at that time.  COVID-19 Education: The signs and symptoms of COVID-19 were discussed with the patient and how to seek care for testing (follow up with PCP or arrange E-visit).  The importance of social distancing was discussed today.  Patient Risk:   After full review of this patients clinical status, I feel that they are at least moderate risk at this  time.  Time:   Today, I have spent 25 minutes with the patient with telehealth technology discussing dyslipidemia, coronary artery calcium score, history of TIA.     Medication Adjustments/Labs and Tests Ordered: Current medicines are reviewed at length with the patient today.  Concerns regarding medicines are outlined above.   Tests Ordered: Orders Placed This Encounter  Procedures  . Lipid panel    Medication Changes: Meds ordered this encounter  Medications  . ezetimibe (ZETIA) 10 MG tablet    Sig: Take 1 tablet (10 mg total) by mouth daily.    Dispense:  90 tablet    Refill:  3    Disposition:  in 3 month(s)  Chrystie Nose, MD, [facility], FACP  [place]  [facility]  Medical Director of the Advanced Lipid Disorders &  Cardiovascular Risk Reduction Clinic Diplomate of the American Board of Clinical Lipidology Attending Cardiologist  Direct Dial: [phone]  Fax: [phone]  Website:  [email or web address]  Chrystie Nose, MD  06/02/2020 9:33 AM

## 2020-06-02 NOTE — Patient Instructions (Signed)
Medication Instructions:  Your physician has recommended you make the following change in your medication: START zetia 10mg   *If you need a refill on your cardiac medications before your next appointment, please call your pharmacy*   Lab Work: FASTING lab work in 3 months to check cholesterol  ** complete about 1 week before your next visit with Dr.  If you have labs (blood work) drawn today and your tests are completely normal, you will receive your results only by: Rennis Golden MyChart Message (if you have MyChart) OR . A paper copy in the mail If you have any lab test that is abnormal or we need to change your treatment, we will call you to review the results.   Testing/Procedures: NONE   Follow-Up: At [facility], you and your health needs are our priority.  As part of our continuing mission to provide you with exceptional heart care, we have created designated Provider Care Teams.  These Care Teams include your primary Cardiologist (physician) and Advanced Practice Providers (APPs -  Physician Assistants and Nurse Practitioners) who all work together to provide you with the care you need, when you need it.  We recommend signing up for the patient portal called "MyChart".  Sign up information is provided on this After Visit Summary.  MyChart is used to connect with patients for Virtual Visits (Telemedicine).  Patients are able to view lab/test results, encounter notes, upcoming appointments, etc.  Non-urgent messages can be sent to your provider as well.   To learn more about what you can do with MyChart, go to [facility].    Your next appointment:   3-4 month(s) - lipid clinic  The format for your next appointment:   In Person  Provider:   [email or web address] Hilty, MD   Other Instructions

## 2020-06-04 ENCOUNTER — Encounter (HOSPITAL_BASED_OUTPATIENT_CLINIC_OR_DEPARTMENT_OTHER): Payer: Self-pay | Admitting: Cardiovascular Disease

## 2020-06-04 NOTE — Procedures (Signed)
Patient Name: Shannon [name], Shannon [name] Date: 05/16/2020 Gender: Female D.O.B: Jul 08, 1950 Age (years): 6 Referring Provider: Runell Gess Height (inches): 67 Interpreting Physician: Nicki Guadalajara MD, ABSM Weight (lbs): 175 RPSGT: Ulyess Mort BMI: 27 MRN: [id number] Neck Size: 14.50  CLINICAL INFORMATION Sleep Study Type: NPSG  Indication for sleep study: Fatigue, Hypertension, Morning Headaches, Snoring  Epworth Sleepiness Score: 10  SLEEP STUDY TECHNIQUE As per the AASM Manual for the Scoring of Sleep and Associated Events v2.3 (April 2016) with a hypopnea requiring 4% desaturations.  The channels recorded and monitored were frontal, central and occipital EEG, electrooculogram (EOG), submentalis EMG (chin), nasal and oral airflow, thoracic and abdominal wall motion, anterior tibialis EMG, snore microphone, electrocardiogram, and pulse oximetry.  MEDICATIONS albuterol (PROVENTIL HFA;VENTOLIN HFA) 108 (90 BASE) MCG/ACT inhaler atorvastatin (LIPITOR) 80 MG tablet busPIRone (BUSPAR) 10 MG tablet cholecalciferol (VITAMIN D3) 25 MCG (1000 UT) tablet clopidogrel (PLAVIX) 75 MG tablet desloratadine (CLARINEX) 5 MG tablet ezetimibe (ZETIA) 10 MG tablet FLUoxetine (PROZAC) 20 MG capsule fluticasone (FLONASE) 50 MCG/ACT nasal spray Fluticasone-Salmeterol (ADVAIR) 500-50 MCG/DOSE AEPB gabapentin (NEURONTIN) 600 MG tablet pantoprazole (PROTONIX) 20 MG tablet valsartan (DIOVAN) 80 MG tablet Medications self-administered by patient taken the night of the study : N/A  SLEEP ARCHITECTURE The study was initiated at 10:44:02 PM and ended at 4:54:30 AM.  Sleep onset time was 57.4 minutes and the sleep efficiency was 81.3%%. The total sleep time was 301.1 minutes.  Stage REM latency was 191.0 minutes.  The patient spent 11.8%% of the night in stage N1 sleep, 69.3%% in stage N2 sleep, 0.0%% in stage N3 and 18.9% in REM.  Alpha intrusion was absent.  Supine sleep was  0.00%.  RESPIRATORY PARAMETERS The overall apnea/hypopnea index (AHI) was 2.6 per hour. The respiratory disturbance index (RDI) was 10.2/h. There were 1 total apneas, including 1 obstructive, 0 central and 0 mixed apneas. There were 12 hypopneas and 38 RERAs.  The AHI during Stage REM sleep was 3.2 per hour.  AHI while supine was N/A per hour.  The mean oxygen saturation was 91.9%. The minimum SpO2 during sleep was 87.0%.  Soft snoring was noted during this study.  CARDIAC DATA The 2 lead EKG demonstrated sinus rhythm. The mean heart rate was 80.4 beats per minute. Other EKG findings include: PVCs.  LEG MOVEMENT DATA The total PLMS were 0 with a resulting PLMS index of 0.0. Associated arousal with leg movement index was 0.0.  IMPRESSIONS - Increased upper airway resistance without significant obstructive sleep apnea  (AHI 2.6/h, RDI 10.2/h) with absent supine sleep. - Mild oxygen desaturation to a nadir of 87.0%. - The patient snored with soft snoring volume. - EKG findings include PVCs. - Clinically significant periodic limb movements did not occur during sleep. No significant associated arousals.  DIAGNOSIS - Increased upper airway reisistance (UARS) - Nocturnal Hypoxemia (G47.36)  RECOMMENDATIONS - At present, there is no indication for CPAP therapy. - Effort should be made to optimize nasal and oropharyngeal patency. - Supine sleep was not present on this study. Since a positional effect on sleep disordered breathing could not be evaluated, continue to avoid supine sleep. - Avoid alcohol, sedatives and other CNS depressants that may worsen sleep apnea and disrupt normal sleep architecture. - Sleep hygiene should be reviewed to assess factors that may improve sleep quality. - Weight management and regular exercise should be initiated or continued if appropriate.  [Electronically signed] 06/04/2020 09:58 AM  Nicki Guadalajara MD, [facility], ABSM Diplomate, American Board  of Sleep  Medicine   NPI: [id number] [place] [facility] PH: [phone]   FX: [phone] ACCREDITED BY THE AMERICAN ACADEMY OF SLEEP MEDICINE

## 2020-06-07 ENCOUNTER — Telehealth: Payer: Self-pay | Admitting: *Deleted

## 2020-06-07 NOTE — Telephone Encounter (Signed)
Patient notified of sleep study results. She voiced verbal understanding.

## 2020-06-07 NOTE — Telephone Encounter (Signed)
Left message to return a call to discuss HST. Results.

## 2020-08-03 ENCOUNTER — Other Ambulatory Visit: Payer: Self-pay | Admitting: Nurse Practitioner

## 2020-08-03 DIAGNOSIS — Z13820 Encounter for screening for osteoporosis: Secondary | ICD-10-CM

## 2020-09-08 ENCOUNTER — Ambulatory Visit
Admission: RE | Admit: 2020-09-08 | Discharge: 2020-09-08 | Disposition: A | Discharge status: Home / Self Care | Payer: Medicare HMO | Source: Ambulatory Visit | Attending: Nurse Practitioner | Admitting: Nurse Practitioner

## 2020-09-08 ENCOUNTER — Other Ambulatory Visit: Payer: Self-pay

## 2020-09-08 DIAGNOSIS — Z13820 Encounter for screening for osteoporosis: Secondary | ICD-10-CM

## 2021-03-05 ENCOUNTER — Other Ambulatory Visit: Payer: Self-pay | Admitting: Internal Medicine

## 2021-03-27 ENCOUNTER — Other Ambulatory Visit: Payer: Self-pay | Admitting: Nurse Practitioner

## 2021-03-27 DIAGNOSIS — Z1231 Encounter for screening mammogram for malignant neoplasm of breast: Secondary | ICD-10-CM

## 2021-04-13 ENCOUNTER — Ambulatory Visit
Admission: RE | Admit: 2021-04-13 | Discharge: 2021-04-13 | Disposition: A | Discharge status: Home / Self Care | Payer: Medicare HMO | Source: Ambulatory Visit | Attending: Nurse Practitioner | Admitting: Nurse Practitioner

## 2021-04-13 DIAGNOSIS — Z1231 Encounter for screening mammogram for malignant neoplasm of breast: Secondary | ICD-10-CM

## 2021-05-03 ENCOUNTER — Other Ambulatory Visit: Payer: Self-pay

## 2021-05-03 ENCOUNTER — Encounter: Payer: Self-pay | Admitting: Pulmonary Disease

## 2021-05-03 ENCOUNTER — Ambulatory Visit (INDEPENDENT_AMBULATORY_CARE_PROVIDER_SITE_OTHER): Payer: Medicare HMO | Admitting: Pulmonary Disease

## 2021-05-03 VITALS — BP 108/60 | HR 94 | Temp 98.4°F | Ht 66.0 in | Wt 173.0 lb

## 2021-05-03 DIAGNOSIS — R911 Solitary pulmonary nodule: Secondary | ICD-10-CM | POA: Diagnosis not present

## 2021-05-03 DIAGNOSIS — J4540 Moderate persistent asthma, uncomplicated: Secondary | ICD-10-CM

## 2021-05-03 DIAGNOSIS — K219 Gastro-esophageal reflux disease without esophagitis: Secondary | ICD-10-CM | POA: Diagnosis not present

## 2021-05-03 MED ORDER — MONTELUKAST SODIUM 10 MG PO TABS: 10.0000 mg | ORAL_TABLET | Freq: Every day | ORAL | 11 refills | Status: DC

## 2021-05-03 MED ORDER — PANTOPRAZOLE SODIUM 40 MG PO TBEC: 40.0000 mg | DELAYED_RELEASE_TABLET | Freq: Every day | ORAL | 6 refills | Status: DC

## 2021-05-03 NOTE — Patient Instructions (Addendum)
Continue advair 500-39mcg 1 puff twice daily ? ?Continue albuterol inhaler as needed for wheezing ? ?Start taking pantoprazole 40mg  daily, 30 minutes prior to breakfast ? ?Start taking montelukast 10mg  daily for asthma/allergies.  ? ?Please call in 1 month if you continue to have wheezing at bedtime.  ? ?Follow up in 3 months with pulmonary function tests ? ? ?

## 2021-05-03 NOTE — Progress Notes (Signed)
? ?Synopsis: Referred in March 2023 for shortness of breath by Vanita Panda, MD ? ?Subjective:  ? ?PATIENT ID: Shannon [name] GENDER: female DOB: 1950-06-29, MRN: [id number] ? ?HPI ? ?Chief Complaint  ?Patient presents with  ? Pulmonary Consult  ?  Referred by Lily Peer, FNP. Pt c/o SOB "for years"- worse over the past year sometimes winded walking room to room and occ has SOB when she lies down and she has to turn on her side for relief. She has wheezing at night. She is using her albuterol inhaler about 2 x per wk and this helps.   ? ?Shannon [name] is a 70 year old woman, former smoker with hypertension, GERD and asthma who is referred to pulmonary clinic for shortness of breath.  ? ?Patient reports using Advair 500-50 mcg 1 puff twice daily over the last 15 to 20 years without much issue until recently she has noticed increased wheezing in the evening at bedtime.  She uses albuterol inhaler with relief of her wheezing.  She denies nighttime awakenings from her sleep with cough wheezing or shortness of breath.  Cold air and hot humid days seem to bother her asthma symptoms along with strong cleaning agents.  She denies any issues with strong perfumes or colognes. ? ?She also reports increased reflux over recent months and has complaints of food getting stuck in her chest similar to before when she required esophageal dilation by gastroenterology.  She has upcoming appointment with her GI team for follow-up.  She is eating 1 hour prior to bedtime. ? ?Seasonal allergies can also affect her asthma symptoms especially spring season. ? ?She is retired and previously worked at [employer] as a Training and development officer.  She is a former smoker and quit in 2000.  She smoked half a pack a day for 30 years.  She was exposed to secondhand smoke in childhood.  She currently lives with her son. ? ?Past Medical History:  ?Diagnosis Date  ? Arthritis   ? "right knee" (05/26/2014)  ? Asthma   ? Chronic lower back pain   ? GERD  (gastroesophageal reflux disease)   ? HLD (hyperlipidemia)   ? Hypertension   ? TIA (transient ischemic attack) 04/2018  ?  ? ?Family History  ?Problem Relation Age of Onset  ? Hypertension Mother   ? Hypertension Father   ? Colon cancer Father   ? Hypertension Sister   ?  ? ?Social History  ? ?Socioeconomic History  ? Marital status: Widowed  ?  Spouse name: Not on file  ? Number of children: Not on file  ? Years of education: Not on file  ? Highest education level: Not on file  ?Occupational History  ? Not on file  ?Tobacco Use  ? Smoking status: Former  ?  Packs/day: 0.50  ?  Years: 30.00  ?  Pack years: 15.00  ?  Types: Cigarettes  ?  Quit date: 11/10/1998  ?  Years since quitting: 22.4  ? Smokeless tobacco: Never  ?Vaping Use  ? Vaping Use: Never used  ?Substance and Sexual Activity  ? Alcohol use: Yes  ?  Alcohol/week: 0.0 standard drinks  ? Drug use: No  ? Sexual activity: Not Currently  ?Other Topics Concern  ? Not on file  ?Social History Narrative  ? Not on file  ? ?Social Determinants of Health  ? ?Financial Resource Strain: Not on file  ?Food Insecurity: Not on file  ?Transportation Needs: Not on file  ?Physical Activity:  Not on file  ?Stress: Not on file  ?Social Connections: Not on file  ?Intimate Partner Violence: Not on file  ?  ? ?Allergies  ?Allergen Reactions  ? Amoxicillin-Pot Clavulanate Itching  ?  ? ?Outpatient Medications Prior to Visit  ?Medication Sig Dispense Refill  ? albuterol (PROVENTIL HFA;VENTOLIN HFA) 108 (90 BASE) MCG/ACT inhaler Inhale 2 puffs into the lungs every 2 (two) hours as needed for wheezing or shortness of breath (cough). 1 Inhaler 0  ? atorvastatin (LIPITOR) 80 MG tablet Take 1 tablet (80 mg total) by mouth daily at 6 PM. 30 tablet 2  ? busPIRone (BUSPAR) 10 MG tablet Take 10 mg by mouth 2 (two) times daily.    ? cholecalciferol (VITAMIN D3) 25 MCG (1000 UT) tablet Take 2,000 Units by mouth daily.    ? clopidogrel (PLAVIX) 75 MG tablet Take 1 tablet (75 mg total) by mouth  daily. 30 tablet 0  ? desloratadine (CLARINEX) 5 MG tablet Take 5 mg by mouth daily.    ? FLUoxetine (PROZAC) 20 MG capsule Take 20 mg by mouth daily.    ? fluticasone (FLONASE) 50 MCG/ACT nasal spray Place 1 spray into both nostrils daily.    ? Fluticasone-Salmeterol (ADVAIR) 500-50 MCG/DOSE AEPB Inhale 1 puff into the lungs 2 (two) times daily.    ? gabapentin (NEURONTIN) 600 MG tablet Take 600 mg by mouth 3 (three) times daily.    ? valsartan (DIOVAN) 80 MG tablet Take 80 mg by mouth daily.    ? pantoprazole (PROTONIX) 20 MG tablet Take 20 mg by mouth daily.    ? ezetimibe (ZETIA) 10 MG tablet Take 1 tablet (10 mg total) by mouth daily. 90 tablet 3  ? ?No facility-administered medications prior to visit.  ? ?Review of Systems  ?Constitutional:  Negative for chills, fever, malaise/fatigue and weight loss.  ?HENT:  Positive for sore throat. Negative for congestion and sinus pain.   ?Eyes: Negative.   ?Respiratory:  Positive for cough, sputum production, shortness of breath and wheezing. Negative for hemoptysis.   ?Cardiovascular:  Positive for palpitations. Negative for chest pain, orthopnea, claudication and leg swelling.  ?Gastrointestinal:  Negative for abdominal pain, heartburn, nausea and vomiting.  ?Genitourinary: Negative.   ?Musculoskeletal:  Negative for joint pain and myalgias.  ?Skin:  Negative for rash.  ?Neurological:  Negative for weakness.  ?Endo/Heme/Allergies: Negative.   ?Psychiatric/Behavioral:  The patient is nervous/anxious.   ? ?Objective:  ? ?Vitals:  ? 05/03/21 1408  ?BP: 108/60  ?Pulse: 94  ?Temp: 98.4 ?F (36.9 ?C)  ?TempSrc: Oral  ?SpO2: 99%  ?Weight: 173 lb (78.5 kg)  ?Height: 5\' 6"  (1.676 m)  ? ? ?Physical Exam ?Constitutional:   ?   General: She is not in acute distress. ?   Appearance: She is not ill-appearing.  ?HENT:  ?   Head: Normocephalic and atraumatic.  ?Eyes:  ?   General: No scleral icterus. ?   Conjunctiva/sclera: Conjunctivae normal.  ?   Pupils: Pupils are equal, round, and  reactive to light.  ?Cardiovascular:  ?   Rate and Rhythm: Normal rate and regular rhythm.  ?   Pulses: Normal pulses.  ?   Heart sounds: Normal heart sounds. No murmur heard. ?Pulmonary:  ?   Effort: Pulmonary effort is normal.  ?   Breath sounds: Normal breath sounds. No wheezing, rhonchi or rales.  ?Abdominal:  ?   General: Bowel sounds are normal.  ?   Palpations: Abdomen is soft.  ?Musculoskeletal:  ?  Right lower leg: No edema.  ?   Left lower leg: No edema.  ?Lymphadenopathy:  ?   Cervical: No cervical adenopathy.  ?Skin: ?   General: Skin is warm and dry.  ?Neurological:  ?   General: No focal deficit present.  ?   Mental Status: She is alert.  ?Psychiatric:     ?   Mood and Affect: Mood normal.     ?   Behavior: Behavior normal.     ?   Thought Content: Thought content normal.     ?   Judgment: Judgment normal.  ? ?CBC ?   ?Component Value Date/Time  ? WBC 5.8 05/04/2018 1030  ? RBC 4.23 05/04/2018 1030  ? HGB 12.8 05/04/2018 1030  ? HCT 40.6 05/04/2018 1030  ? PLT 246 05/04/2018 1030  ? MCV 96.0 05/04/2018 1030  ? MCH 30.3 05/04/2018 1030  ? MCHC 31.5 05/04/2018 1030  ? RDW 13.1 05/04/2018 1030  ? LYMPHSABS 1.7 05/04/2018 0600  ? MONOABS 0.4 05/04/2018 0600  ? EOSABS 0.3 05/04/2018 0600  ? BASOSABS 0.0 05/04/2018 0600  ? ?BMP Latest Ref Rng & Units 06/17/2019 05/04/2018 05/04/2018  ?Glucose 65 - 99 mg/dL 89 - -  ?BUN 8 - 27 mg/dL 15 - -  ?Creatinine 0.57 - 1.00 mg/dL 1.00 0.99 1.10(H)  ?BUN/Creat Ratio 12 - 28 15 - -  ?Sodium 134 - 144 mmol/L 143 - -  ?Potassium 3.5 - 5.2 mmol/L 4.8 - -  ?Chloride 96 - 106 mmol/L 104 - -  ?CO2 20 - 29 mmol/L 20 - -  ?Calcium 8.7 - 10.3 mg/dL 9.4 - -  ? ?Chest imaging: ?CT Coronary Scan 04/21/20 ?1.  No acute findings in the imaged extracardiac chest. ?2.  Aortic Atherosclerosis (ICD10-I70.0). ?3. Right middle lobe 4 mm pulmonary nodule. No follow-up needed if ?patient is low-risk. Non-contrast chest CT can be considered in 12 ?months if patient is high-risk. This  recommendation follows the ?consensus statement: Guidelines for Management of Incidental ?Pulmonary Nodules Detected on CT Images: From the Fleischner Society ?2017; Radiology 2017; [id number]. ?4.  Tiny hiatal he

## 2021-06-19 ENCOUNTER — Emergency Department (HOSPITAL_COMMUNITY): Payer: Medicare HMO

## 2021-06-19 ENCOUNTER — Observation Stay (HOSPITAL_COMMUNITY)
Admission: EM | Admit: 2021-06-19 | Discharge: 2021-06-21 | Disposition: A | Discharge status: Home / Self Care | Payer: Medicare HMO | Attending: Internal Medicine | Admitting: Internal Medicine

## 2021-06-19 DIAGNOSIS — N179 Acute kidney failure, unspecified: Secondary | ICD-10-CM | POA: Diagnosis present

## 2021-06-19 DIAGNOSIS — Z79899 Other long term (current) drug therapy: Secondary | ICD-10-CM | POA: Diagnosis not present

## 2021-06-19 DIAGNOSIS — Z87891 Personal history of nicotine dependence: Secondary | ICD-10-CM | POA: Insufficient documentation

## 2021-06-19 DIAGNOSIS — J441 Chronic obstructive pulmonary disease with (acute) exacerbation: Secondary | ICD-10-CM | POA: Diagnosis not present

## 2021-06-19 DIAGNOSIS — Z7902 Long term (current) use of antithrombotics/antiplatelets: Secondary | ICD-10-CM | POA: Diagnosis not present

## 2021-06-19 DIAGNOSIS — R0602 Shortness of breath: Secondary | ICD-10-CM | POA: Diagnosis present

## 2021-06-19 DIAGNOSIS — J45901 Unspecified asthma with (acute) exacerbation: Secondary | ICD-10-CM

## 2021-06-19 DIAGNOSIS — I10 Essential (primary) hypertension: Secondary | ICD-10-CM | POA: Insufficient documentation

## 2021-06-19 DIAGNOSIS — Z8673 Personal history of transient ischemic attack (TIA), and cerebral infarction without residual deficits: Secondary | ICD-10-CM | POA: Insufficient documentation

## 2021-06-19 DIAGNOSIS — J9601 Acute respiratory failure with hypoxia: Secondary | ICD-10-CM

## 2021-06-19 DIAGNOSIS — J189 Pneumonia, unspecified organism: Secondary | ICD-10-CM

## 2021-06-19 DIAGNOSIS — Z20822 Contact with and (suspected) exposure to covid-19: Secondary | ICD-10-CM | POA: Diagnosis not present

## 2021-06-19 DIAGNOSIS — E876 Hypokalemia: Secondary | ICD-10-CM | POA: Diagnosis not present

## 2021-06-19 LAB — PROTIME-INR
INR: 1 (ref 0.8–1.2)
Prothrombin Time: 13 seconds (ref 11.4–15.2)

## 2021-06-19 LAB — LACTIC ACID, PLASMA: Lactic Acid, Venous: 1.4 mmol/L (ref 0.5–1.9)

## 2021-06-19 LAB — COMPREHENSIVE METABOLIC PANEL
ALT: 17 U/L (ref 0–44)
AST: 25 U/L (ref 15–41)
Albumin: 3.3 g/dL — ABNORMAL LOW (ref 3.5–5.0)
Alkaline Phosphatase: 71 U/L (ref 38–126)
Anion gap: 12 (ref 5–15)
BUN: 8 mg/dL (ref 8–23)
CO2: 20 mmol/L — ABNORMAL LOW (ref 22–32)
Calcium: 8.6 mg/dL — ABNORMAL LOW (ref 8.9–10.3)
Chloride: 105 mmol/L (ref 98–111)
Creatinine, Ser: 1.3 mg/dL — ABNORMAL HIGH (ref 0.44–1.00)
GFR, Estimated: 44 mL/min — ABNORMAL LOW (ref >60)
Glucose, Bld: 145 mg/dL — ABNORMAL HIGH (ref 70–99)
Potassium: 2.9 mmol/L — ABNORMAL LOW (ref 3.5–5.1)
Sodium: 137 mmol/L (ref 135–145)
Total Bilirubin: 1.2 mg/dL (ref 0.3–1.2)
Total Protein: 7.2 g/dL (ref 6.5–8.1)

## 2021-06-19 LAB — CBC WITH DIFFERENTIAL/PLATELET
Abs Immature Granulocytes: 0.02 10*3/uL (ref 0.00–0.07)
Basophils Absolute: 0 10*3/uL (ref 0.0–0.1)
Basophils Relative: 0 %
Eosinophils Absolute: 0 10*3/uL (ref 0.0–0.5)
Eosinophils Relative: 0 %
HCT: 36.5 % (ref 36.0–46.0)
Hemoglobin: 12.2 g/dL (ref 12.0–15.0)
Immature Granulocytes: 0 %
Lymphocytes Relative: 12 %
Lymphs Abs: 1.1 10*3/uL (ref 0.7–4.0)
MCH: 31 pg (ref 26.0–34.0)
MCHC: 33.4 g/dL (ref 30.0–36.0)
MCV: 92.9 fL (ref 80.0–100.0)
Monocytes Absolute: 1.1 10*3/uL — ABNORMAL HIGH (ref 0.1–1.0)
Monocytes Relative: 12 %
Neutro Abs: 6.9 10*3/uL (ref 1.7–7.7)
Neutrophils Relative %: 76 %
Platelets: 240 10*3/uL (ref 150–400)
RBC: 3.93 MIL/uL (ref 3.87–5.11)
RDW: 15.2 % (ref 11.5–15.5)
WBC: 9.1 10*3/uL (ref 4.0–10.5)
nRBC: 0 % (ref 0.0–0.2)

## 2021-06-19 LAB — RESP PANEL BY RT-PCR (FLU A&B, COVID) ARPGX2
Influenza A by PCR: NEGATIVE
Influenza B by PCR: NEGATIVE
SARS Coronavirus 2 by RT PCR: NEGATIVE

## 2021-06-19 LAB — APTT: aPTT: 31 seconds (ref 24–36)

## 2021-06-19 LAB — MAGNESIUM: Magnesium: 1.5 mg/dL — ABNORMAL LOW (ref 1.7–2.4)

## 2021-06-19 MED ORDER — SODIUM CHLORIDE 0.9 % IV SOLN
2.0000 g | Freq: Once | INTRAVENOUS | Status: AC
  Administered 2021-06-19: 2 g via INTRAVENOUS
  Filled 2021-06-19: qty 20

## 2021-06-19 MED ORDER — SODIUM CHLORIDE 0.9 % IV SOLN: 500.0000 mg | INTRAVENOUS | Status: DC

## 2021-06-19 MED ORDER — MONTELUKAST SODIUM 10 MG PO TABS
10.0000 mg | ORAL_TABLET | Freq: Every day | ORAL | Status: DC
  Administered 2021-06-20: 10 mg via ORAL
  Filled 2021-06-19 (×2): qty 1

## 2021-06-19 MED ORDER — SODIUM CHLORIDE 0.9 % IV SOLN: 1.0000 g | INTRAVENOUS | Status: DC

## 2021-06-19 MED ORDER — IPRATROPIUM-ALBUTEROL 0.5-2.5 (3) MG/3ML IN SOLN
3.0000 mL | Freq: Four times a day (QID) | RESPIRATORY_TRACT | Status: DC
  Administered 2021-06-20 – 2021-06-21 (×5): 3 mL via RESPIRATORY_TRACT
  Filled 2021-06-19 (×5): qty 3

## 2021-06-19 MED ORDER — LACTATED RINGERS IV BOLUS (SEPSIS)
1000.0000 mL | Freq: Once | INTRAVENOUS | Status: AC
  Administered 2021-06-19: 1000 mL via INTRAVENOUS

## 2021-06-19 MED ORDER — BUSPIRONE HCL 5 MG PO TABS
10.0000 mg | ORAL_TABLET | Freq: Two times a day (BID) | ORAL | Status: DC
  Administered 2021-06-20 – 2021-06-21 (×4): 10 mg via ORAL
  Filled 2021-06-19: qty 1
  Filled 2021-06-19: qty 2
  Filled 2021-06-19 (×2): qty 1

## 2021-06-19 MED ORDER — CLOPIDOGREL BISULFATE 75 MG PO TABS
75.0000 mg | ORAL_TABLET | Freq: Every day | ORAL | Status: DC
  Administered 2021-06-20 – 2021-06-21 (×2): 75 mg via ORAL
  Filled 2021-06-19 (×2): qty 1

## 2021-06-19 MED ORDER — PANTOPRAZOLE SODIUM 40 MG PO TBEC
40.0000 mg | DELAYED_RELEASE_TABLET | Freq: Every day | ORAL | Status: DC
  Administered 2021-06-20 – 2021-06-21 (×2): 40 mg via ORAL
  Filled 2021-06-19 (×2): qty 1

## 2021-06-19 MED ORDER — ONDANSETRON HCL 4 MG/2ML IJ SOLN: 4.0000 mg | Freq: Four times a day (QID) | INTRAMUSCULAR | Status: DC | PRN

## 2021-06-19 MED ORDER — POTASSIUM CHLORIDE CRYS ER 20 MEQ PO TBCR
40.0000 meq | EXTENDED_RELEASE_TABLET | Freq: Once | ORAL | Status: AC
  Administered 2021-06-19: 40 meq via ORAL
  Filled 2021-06-19: qty 2

## 2021-06-19 MED ORDER — ONDANSETRON HCL 4 MG PO TABS: 4.0000 mg | ORAL_TABLET | Freq: Four times a day (QID) | ORAL | Status: DC | PRN

## 2021-06-19 MED ORDER — METHYLPREDNISOLONE SODIUM SUCC 125 MG IJ SOLR
125.0000 mg | Freq: Once | INTRAMUSCULAR | Status: AC
  Administered 2021-06-19: 125 mg via INTRAVENOUS
  Filled 2021-06-19: qty 2

## 2021-06-19 MED ORDER — SODIUM CHLORIDE 0.9 % IV SOLN
500.0000 mg | INTRAVENOUS | Status: DC
  Administered 2021-06-20: 500 mg via INTRAVENOUS
  Filled 2021-06-19: qty 5

## 2021-06-19 MED ORDER — SODIUM CHLORIDE 0.9 % IV SOLN
500.0000 mg | Freq: Once | INTRAVENOUS | Status: AC
  Administered 2021-06-19: 500 mg via INTRAVENOUS
  Filled 2021-06-19: qty 5

## 2021-06-19 MED ORDER — SODIUM CHLORIDE 0.9 % IV SOLN
1.0000 g | INTRAVENOUS | Status: DC
  Administered 2021-06-20: 1 g via INTRAVENOUS
  Filled 2021-06-19: qty 10

## 2021-06-19 MED ORDER — MAGNESIUM SULFATE 2 GM/50ML IV SOLN
2.0000 g | Freq: Once | INTRAVENOUS | Status: AC
  Administered 2021-06-19: 2 g via INTRAVENOUS
  Filled 2021-06-19: qty 50

## 2021-06-19 MED ORDER — MAGNESIUM OXIDE -MG SUPPLEMENT 400 (240 MG) MG PO TABS
800.0000 mg | ORAL_TABLET | Freq: Once | ORAL | Status: AC
  Administered 2021-06-19: 800 mg via ORAL
  Filled 2021-06-19: qty 2

## 2021-06-19 MED ORDER — POLYETHYLENE GLYCOL 3350 17 G PO PACK: 17.0000 g | PACK | Freq: Every day | ORAL | Status: DC | PRN

## 2021-06-19 MED ORDER — FLUOXETINE HCL 20 MG PO CAPS
20.0000 mg | ORAL_CAPSULE | Freq: Every day | ORAL | Status: DC
  Administered 2021-06-20 – 2021-06-21 (×2): 20 mg via ORAL
  Filled 2021-06-19 (×2): qty 1

## 2021-06-19 MED ORDER — ACETAMINOPHEN 500 MG PO TABS
1000.0000 mg | ORAL_TABLET | Freq: Once | ORAL | Status: AC
  Administered 2021-06-19: 1000 mg via ORAL
  Filled 2021-06-19: qty 2

## 2021-06-19 MED ORDER — ENOXAPARIN SODIUM 40 MG/0.4ML IJ SOSY
40.0000 mg | PREFILLED_SYRINGE | INTRAMUSCULAR | Status: DC
  Administered 2021-06-20 – 2021-06-21 (×2): 40 mg via SUBCUTANEOUS
  Filled 2021-06-19 (×2): qty 0.4

## 2021-06-19 MED ORDER — ACETAMINOPHEN 325 MG PO TABS
650.0000 mg | ORAL_TABLET | Freq: Four times a day (QID) | ORAL | Status: DC | PRN
  Administered 2021-06-21: 650 mg via ORAL
  Filled 2021-06-19: qty 2

## 2021-06-19 MED ORDER — ATORVASTATIN CALCIUM 80 MG PO TABS
80.0000 mg | ORAL_TABLET | Freq: Every day | ORAL | Status: DC
  Administered 2021-06-20 – 2021-06-21 (×2): 80 mg via ORAL
  Filled 2021-06-19 (×2): qty 1

## 2021-06-19 MED ORDER — ACETAMINOPHEN 650 MG RE SUPP: 650.0000 mg | Freq: Four times a day (QID) | RECTAL | Status: DC | PRN

## 2021-06-19 MED ORDER — POTASSIUM CHLORIDE 10 MEQ/100ML IV SOLN
10.0000 meq | Freq: Once | INTRAVENOUS | Status: AC
Start: 2021-06-19 — End: 2021-06-20
  Administered 2021-06-19: 10 meq via INTRAVENOUS
  Filled 2021-06-19: qty 100

## 2021-06-19 MED ORDER — LACTATED RINGERS IV BOLUS
1000.0000 mL | Freq: Once | INTRAVENOUS | Status: AC
  Administered 2021-06-20: 1000 mL via INTRAVENOUS

## 2021-06-19 MED ORDER — GABAPENTIN 300 MG PO CAPS
600.0000 mg | ORAL_CAPSULE | Freq: Three times a day (TID) | ORAL | Status: DC
  Administered 2021-06-20 – 2021-06-21 (×6): 600 mg via ORAL
  Filled 2021-06-19 (×6): qty 2

## 2021-06-19 MED ORDER — ALBUTEROL SULFATE HFA 108 (90 BASE) MCG/ACT IN AERS: 2.0000 | INHALATION_SPRAY | RESPIRATORY_TRACT | Status: DC | PRN

## 2021-06-19 MED ORDER — PREDNISONE 20 MG PO TABS
40.0000 mg | ORAL_TABLET | Freq: Every day | ORAL | Status: DC
  Administered 2021-06-20 – 2021-06-21 (×2): 40 mg via ORAL
  Filled 2021-06-19 (×2): qty 2

## 2021-06-19 MED ORDER — IPRATROPIUM-ALBUTEROL 0.5-2.5 (3) MG/3ML IN SOLN
3.0000 mL | RESPIRATORY_TRACT | Status: AC
  Administered 2021-06-19 (×3): 3 mL via RESPIRATORY_TRACT
  Filled 2021-06-19: qty 3

## 2021-06-19 MED ORDER — IPRATROPIUM-ALBUTEROL 0.5-2.5 (3) MG/3ML IN SOLN
RESPIRATORY_TRACT | Status: AC
  Filled 2021-06-19: qty 9

## 2021-06-19 NOTE — H&P (Signed)
? ? ? ?Date: 06/19/2021     ?     ?     ?Patient Name:  Shannon [name] MRN: [id number]  ?DOB: 02-05-1951 Age / Sex: 71 y.o., female   ?PCP: Sherral Hammers, FNP    ?     ?Medical Service: Internal Medicine Teaching Service    ?     ?Attending Physician: Dr. Melene Plan, DO    ?First Contact: Dr. Shan Levans Pager: [phone]  ?Second Contact: Dr. Marolyn Haller Pager: [phone]  ?     ?After Hours (After 5p/  First Contact Pager: [phone]  ?weekends / holidays): Second Contact Pager: [phone]  ? ?Chief Complaint: Shortness of breath ? ?History of Present Illness:  ? ?Wilmary Levit is a 71 year old female with past medical history of hypertension, hyperlipidemia, TIA, asthma, former smoker, who presents to the emergency room for worsening shortness of breath and fever, found to have asthma exacerbation in setting of community acquired pneumonia ? ?Patient said that she has been feeling sick since Sunday.  Reported fever with temperature 102.4, sweating, chills, body aches, headache, sore throat and sinus congestion.  Reported increased sputum production, white color.  Also reports increasing wheezing which she used albuterol inhaler 3 times daily.  She is not on oxygen at home.  She has been taking Mucinex and Goody powder for symptom relief.  Patient denies sick contact or travel. ? ?Also endorses nausea and vomiting with eating.  She denies abdominal pain.  Report less p.o. intake because of the symptoms. ? ?Patient denies orthopnea or LE edema.  Denies any issue with urination. ? ?In the ED, she was started on 3 rounds of DuoNebs, Mag and antibiotics for asthma exacerbation and CAP.  Currently satting well on 2 L nasal cannula.  Chest x-ray show a wedge shaped density in the left upper lobe which was new.  CBC without leukocytosis.  BMP showed AKI with hypokalemia. ? ? ? ?Meds:  ?Singulair 10 mg daily at bedtime ?Protonix 40 mg daily ?Buspar 10 mg BID ?Fluoxetine 20 mg daily ?Valsartan 80 mg  daily ?Plavix 75 mg daily ?Atorvastatin 80 mg daily ?Vitamin D3 2,000 units daily ?Desloratidine 5 mg daily ?Gabapentin 600 mg TID ?Advair 1 puff BID ?Flonase 1 spray in both nostrils daily ?Albuterol inhaler 2 puffs q2h PRN wheezing ? ?Allergies: ?Allergies as of 06/19/2021 - Review Complete 06/19/2021  ?Allergen Reaction Noted  ? Amoxicillin-pot clavulanate Itching 11/10/2018  ? ?Past Medical History:  ?Diagnosis Date  ? Arthritis   ? "right knee" (05/26/2014)  ? Asthma   ? Chronic lower back pain   ? GERD (gastroesophageal reflux disease)   ? HLD (hyperlipidemia)   ? Hypertension   ? TIA (transient ischemic attack) 04/2018  ? ? ?Family History:  ?No pertinent family history reported. ? ?Social History:  ?Lives at home with her son. 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  Quit in 2000.  Had 30 pack years ?No other substance use. ?Independent in ADLs/IADLs. ? ?Review of Systems: ?A complete ROS was negative except as per HPI.  ? ?Physical Exam: ?Blood pressure 110/61, pulse (!) 106, temperature 99.2 ?F (37.3 ?C), temperature source Oral, resp. rate (!) 22, height 5\' 6"  (1.676 m), weight 78.5 kg, SpO2 95 %. ?Constitutional:Resting comfortably in bed in no acute distress. ?HENT: Ears: bilateral TM normal; no erythema or effusion; ear canal normal. Throat: No pharyngeal erythema; no exudate; no tonsillar edema or exudate. ?Neck:No  lymphadenopathy. ?Cardio:Tachycardia with regular rhythm. ?Pulm:Mild wheezing noted on R middle lung field and base. SpO2 >95% on [place]. ?Abdomen:Soft, nontender, nondistended. ?JQB:HALPFXTK for extremity edema. ?Skin:Warm and dry. ?Neuro:Alert and oriented, no focal deficit. ?Psych:Pleasant mood and affect. ? ?EKG: personally reviewed my interpretation is sinus tachycardia with abnormal R wave progression, early transition. ? ?CXR: personally reviewed my interpretation is wedge-shaped density in the L upper lobe which may represent focal  infiltrates. ? ?Assessment & Plan by Problem: ?Principal Problem: ?  Asthma exacerbation ?Active Problems: ?  AKI (acute kidney injury) (HCC) ?  Hypokalemia ?  Community acquired pneumonia ? ?Asthma exacerbation ?Patient follows with pulmonology as an outpatient with most recent visit 03/16. Managed medically with Advair, albuterol, montelukast. She is not on home oxygen. Since presenting to the ED she has received Duoneb treatment as well as solu-medrol 125 mg and has had improvement of her dyspnea. ?-Continue treatment for CAP ?-Prednisone 40 mg daily for 4 more days ?-Scheduled duoneb, q6h ?-Continue home Singulair 10 mg daily ? ?Community acquired pneumonia ?Patient has had fever and dyspnea for several days now and was noted to have a wedge-shaped density in her L upper lobe on chest x-ray. She is s/p azithromycin 500 mg IV x1 and ceftriaxone 2 g IV x1 in the ED. She is hypotensive with tachycardia and did have fever of 100.4 when she initially presented. At this time I am concerned for CAP as the main cause of her symptoms, though with sinus congestion and sore throat there may be a component of URI as well. Lactic acid is WNL. ?-Continue azithromycin 500 mg daily, ceftriaxone 1 g daily for 4 more days ?-Additional 1L LR bolus ?-Tylenol 650 mg PRN body aches, fever ?-CT chest to further assess wedge-shaped density ?-Blood cultures pending ?-Titrate supplemental oxygen as tolerated ? ?AKI ?Admission CMP remarkable for elevated serum creatinine of 1.30, from apparent baseline of around 1.10. Patient endorses poor PO intake since onset of symptoms due to nausea and vomiting associated with attempts at eating, but has tried to stay hydrated. Likely pre-renal due to hypovolemia. ?-Urinalysis ?-Urine sodium ?-Additional 1L LR ? ?Hypokalemia ?Hypomagnesemia ?Admission CMP remarkable for K of 2.9, magnesium 1.5. Hypokalemia is likely multifactorial including GI losses, breathing treatments, hypomagnesemia. She has  received repletion of both magnesium and potassium in the ED. ?-Trend electrolytes and replete as indicated ? ? ?Dispo: Admit patient to Observation with expected length of stay less than 2 midnights. ? ?Signed: ?Champ Mungo, DO  ?06/19/2021, 11:39 PM  ?Pager: [phone] ?After 5pm on weekdays and 1pm on weekends: On Call pager: [phone]  ?

## 2021-06-19 NOTE — ED Triage Notes (Incomplete)
Pt reports dizziness that began on Sunday. Reports lightheadedness when standing up. Pt also reports fever since Sunday that she has been treating it with tylenol but it hasnt gone down. Pt has been nauseous and vomiting x1 day ?

## 2021-06-19 NOTE — ED Provider Notes (Signed)
?[facility] ?Provider Note ? ? ?CSN: [id number] ?Arrival date & time: 06/19/21  2124 ? ?  ? ?History ? ?Chief Complaint  ?Patient presents with  ? Dizziness  ? Nausea  ? Fever  ? ? ?Shannon [name] is a 71 y.o. female. ? ?71 yo  F with a chief complaint of cough fever shortness of breath.  This been going on for about a week now.  Has been getting worse over the past 48 hours.  She has been having increased sputum production.  Does not think it is changed consistency all that much.  Having fevers as high as 102 at home.  Has been taking her breathing medicines at home with some significant improvements but just transiently so.  Eventually called EMS this evening they brought her here for evaluation.  Was given a DuoNeb in route with some improved aeration. ? ? ?Dizziness ?Fever ? ?  ? ?Home Medications ?Prior to Admission medications   ?Medication Sig Start Date End Date Taking? Authorizing Provider  ?albuterol (PROVENTIL HFA;VENTOLIN HFA) 108 (90 BASE) MCG/ACT inhaler Inhale 2 puffs into the lungs every 2 (two) hours as needed for wheezing or shortness of breath (cough). 09/21/12   Hazel Sams, PA-C  ?atorvastatin (LIPITOR) 80 MG tablet Take 1 tablet (80 mg total) by mouth daily at 6 PM. 05/05/18   Alphonzo Grieve, MD  ?busPIRone (BUSPAR) 10 MG tablet Take 10 mg by mouth 2 (two) times daily. 05/30/20   [provider]  ?cholecalciferol (VITAMIN D3) 25 MCG (1000 UT) tablet Take 2,000 Units by mouth daily.    [provider]  ?clopidogrel (PLAVIX) 75 MG tablet Take 1 tablet (75 mg total) by mouth daily. 06/09/18   Garvin Fila, MD  ?desloratadine (CLARINEX) 5 MG tablet Take 5 mg by mouth daily.    [provider]  ?FLUoxetine (PROZAC) 20 MG capsule Take 20 mg by mouth daily. 04/19/20   [provider]  ?fluticasone (FLONASE) 50 MCG/ACT nasal spray Place 1 spray into both nostrils daily.    [provider]  ?Fluticasone-Salmeterol  (ADVAIR) 500-50 MCG/DOSE AEPB Inhale 1 puff into the lungs 2 (two) times daily.    [provider]  ?gabapentin (NEURONTIN) 600 MG tablet Take 600 mg by mouth 3 (three) times daily.    [provider]  ?montelukast (SINGULAIR) 10 MG tablet Take 1 tablet (10 mg total) by mouth at bedtime. 05/03/21   Freddi Starr, MD  ?pantoprazole (PROTONIX) 40 MG tablet Take 1 tablet (40 mg total) by mouth daily. 05/03/21   Freddi Starr, MD  ?valsartan (DIOVAN) 80 MG tablet Take 80 mg by mouth daily. 05/30/19   [provider]  ?   ? ?Allergies    ?Amoxicillin-pot clavulanate   ? ?Review of Systems   ?Review of Systems  ?Constitutional:  Positive for fever.  ?Neurological:  Positive for dizziness.  ? ?Physical Exam ?Updated Vital Signs ?Pulse (!) 128   Temp (!) 100.4 ?F (38 ?C) (Oral)   Resp (!) 23   Ht 5\' 6"  (1.676 m)   Wt 78.5 kg   SpO2 94%   BMI 27.93 kg/m?  ?Physical Exam ?Vitals and nursing note reviewed.  ?Constitutional:   ?   General: She is not in acute distress. ?   Appearance: She is well-developed. She is not diaphoretic.  ?HENT:  ?   Head: Normocephalic and atraumatic.  ?Eyes:  ?   Pupils: Pupils are equal, round, and reactive  to light.  ?Cardiovascular:  ?   Rate and Rhythm: Normal rate and regular rhythm.  ?   Heart sounds: No murmur heard. ?  No friction rub. No gallop.  ?Pulmonary:  ?   Effort: Pulmonary effort is normal.  ?   Breath sounds: Wheezing present. No rales.  ?   Comments: Diminished breath sounds diffusely with wheezes and prolonged expiratory effort. ?Abdominal:  ?   General: There is no distension.  ?   Palpations: Abdomen is soft.  ?   Tenderness: There is no abdominal tenderness.  ?Musculoskeletal:     ?   General: No tenderness.  ?   Cervical back: Normal range of motion and neck supple.  ?Skin: ?   General: Skin is warm and dry.  ?Neurological:  ?   Mental Status: She is alert and oriented to person, place, and time.  ?Psychiatric:     ?   Behavior:  Behavior normal.  ? ? ?ED Results / Procedures / Treatments   ?Labs ?(all labs ordered are listed, but only abnormal results are displayed) ?Labs Reviewed  ?CULTURE, BLOOD (ROUTINE X 2)  ?CULTURE, BLOOD (ROUTINE X 2)  ?URINE CULTURE  ?RESP PANEL BY RT-PCR (FLU A&B, COVID) [id number]  ?LACTIC ACID, PLASMA  ?LACTIC ACID, PLASMA  ?COMPREHENSIVE METABOLIC PANEL  ?CBC WITH DIFFERENTIAL/PLATELET  ?PROTIME-INR  ?APTT  ?URINALYSIS, ROUTINE W REFLEX MICROSCOPIC  ?MAGNESIUM  ? ? ?EKG ?EKG Interpretation ? ?Date/Time:  Tuesday Jun 19 2021 21:30:08 EDT ?Ventricular Rate:  129 ?PR Interval:  122 ?QRS Duration: 99 ?QT Interval:  330 ?QTC Calculation: 484 ?R Axis:   -9 ?Text Interpretation: Sinus tachycardia Abnormal R-wave progression, early transition Borderline repolarization abnormality Since last tracing rate faster Otherwise no significant change Confirmed by Deno Etienne [phone]) on 06/19/2021 9:34:30 PM ? ?Radiology ?No results found. ? ?Procedures ?Procedures  ? ? ?Medications Ordered in ED ?Medications  ?lactated ringers bolus 1,000 mL (has no administration in time range)  ?acetaminophen (TYLENOL) tablet 1,000 mg (has no administration in time range)  ?ipratropium-albuterol (DUONEB) 0.5-2.5 (3) MG/3ML nebulizer solution 3 mL (has no administration in time range)  ?methylPREDNISolone sodium succinate (SOLU-MEDROL) 125 mg/2 mL injection 125 mg (has no administration in time range)  ?magnesium sulfate IVPB 2 g 50 mL (has no administration in time range)  ? ? ?ED Course/ Medical Decision Making/ A&P ?  ?                        ?Medical Decision Making ?Amount and/or Complexity of Data Reviewed ?Labs: ordered. ?Radiology: ordered. ?ECG/medicine tests: ordered. ? ?Risk ?OTC drugs. ?Prescription drug management. ? ? ?71 yo F with a chief complaint of cough fever and shortness of breath.  This been going on for about a week now.  Has had some transient improvement with her breathing treatments off and on.  I suspect most likely she has  a COPD exacerbation.  With her having fevers as high as 102 will start on antibiotics for community-acquired pneumonia.  Chest x-ray blood work bolus of IV fluids reassess. ? ?Of note the patient is significantly tachycardic heart rates into the 130s.  Appears to be sinus tachycardia on the monitor.  Could be due to the beta agonist therapy or perhaps due to her fever or dehydration.  We will give a bolus of IV fluids Tylenol. ? ? ?Patient's chest x-ray independently interpreted by me with focal infiltrate, concerning for pneumonia.  Patient has hypokalemia hypomagnesemia. ? ?Could  be due to shifting with beta agonist therapy.  We will give a dose of oral K and mag.  1 run of potassium. ? ?Patient feeling much better on reassessment.  She had been placed on a couple liters oxygen with some hypoxia post breathing treatment.  Her heart rate has improved with IV fluids. ? ?We will discuss with medicine for admission. ? ?CRITICAL CARE ?Performed by: Cecilio Asper ? ? ?Total critical care time: 35 minutes ? ?Critical care time was exclusive of separately billable procedures and treating other patients. ? ?Critical care was necessary to treat or prevent imminent or life-threatening deterioration. ? ?Critical care was time spent personally by me on the following activities: development of treatment plan with patient and/or surrogate as well as nursing, discussions with consultants, evaluation of patient's response to treatment, examination of patient, obtaining history from patient or surrogate, ordering and performing treatments and interventions, ordering and review of laboratory studies, ordering and review of radiographic studies, pulse oximetry and re-evaluation of patient's condition. ? ?The patients results and plan were reviewed and discussed.   ?Any x-rays performed were independently reviewed by myself.  ? ?Differential diagnosis were considered with the presenting HPI. ? ?Medications  ?cefTRIAXone  (ROCEPHIN) 2 g in sodium chloride 0.9 % 100 mL IVPB (2 g Intravenous New Bag/Given 06/19/21 2243)  ?azithromycin (ZITHROMAX) 500 mg in sodium chloride 0.9 % 250 mL IVPB (500 mg Intravenous New Bag/Given 06/19/21 2244)  ?potassium

## 2021-06-20 ENCOUNTER — Observation Stay (HOSPITAL_COMMUNITY): Payer: Medicare HMO

## 2021-06-20 DIAGNOSIS — J4541 Moderate persistent asthma with (acute) exacerbation: Secondary | ICD-10-CM

## 2021-06-20 DIAGNOSIS — J441 Chronic obstructive pulmonary disease with (acute) exacerbation: Secondary | ICD-10-CM | POA: Diagnosis not present

## 2021-06-20 DIAGNOSIS — J45901 Unspecified asthma with (acute) exacerbation: Secondary | ICD-10-CM | POA: Diagnosis not present

## 2021-06-20 DIAGNOSIS — J9601 Acute respiratory failure with hypoxia: Secondary | ICD-10-CM | POA: Insufficient documentation

## 2021-06-20 DIAGNOSIS — J189 Pneumonia, unspecified organism: Secondary | ICD-10-CM | POA: Diagnosis not present

## 2021-06-20 DIAGNOSIS — N179 Acute kidney failure, unspecified: Secondary | ICD-10-CM

## 2021-06-20 DIAGNOSIS — R911 Solitary pulmonary nodule: Secondary | ICD-10-CM | POA: Insufficient documentation

## 2021-06-20 LAB — COMPREHENSIVE METABOLIC PANEL
ALT: 14 U/L (ref 0–44)
AST: 20 U/L (ref 15–41)
Albumin: 2.5 g/dL — ABNORMAL LOW (ref 3.5–5.0)
Alkaline Phosphatase: 59 U/L (ref 38–126)
Anion gap: 8 (ref 5–15)
BUN: 12 mg/dL (ref 8–23)
CO2: 20 mmol/L — ABNORMAL LOW (ref 22–32)
Calcium: 7.9 mg/dL — ABNORMAL LOW (ref 8.9–10.3)
Chloride: 108 mmol/L (ref 98–111)
Creatinine, Ser: 1.4 mg/dL — ABNORMAL HIGH (ref 0.44–1.00)
GFR, Estimated: 40 mL/min — ABNORMAL LOW (ref >60)
Glucose, Bld: 232 mg/dL — ABNORMAL HIGH (ref 70–99)
Potassium: 3.4 mmol/L — ABNORMAL LOW (ref 3.5–5.1)
Sodium: 136 mmol/L (ref 135–145)
Total Bilirubin: 0.4 mg/dL (ref 0.3–1.2)
Total Protein: 5.7 g/dL — ABNORMAL LOW (ref 6.5–8.1)

## 2021-06-20 LAB — CBC
HCT: 32.2 % — ABNORMAL LOW (ref 36.0–46.0)
Hemoglobin: 10.4 g/dL — ABNORMAL LOW (ref 12.0–15.0)
MCH: 31 pg (ref 26.0–34.0)
MCHC: 32.3 g/dL (ref 30.0–36.0)
MCV: 95.8 fL (ref 80.0–100.0)
Platelets: 207 10*3/uL (ref 150–400)
RBC: 3.36 MIL/uL — ABNORMAL LOW (ref 3.87–5.11)
RDW: 15.4 % (ref 11.5–15.5)
WBC: 7.7 10*3/uL (ref 4.0–10.5)
nRBC: 0 % (ref 0.0–0.2)

## 2021-06-20 LAB — STREP PNEUMONIAE URINARY ANTIGEN: Strep Pneumo Urinary Antigen: NEGATIVE

## 2021-06-20 LAB — MAGNESIUM: Magnesium: 2 mg/dL (ref 1.7–2.4)

## 2021-06-20 MED ORDER — LACTATED RINGERS IV BOLUS
1000.0000 mL | Freq: Once | INTRAVENOUS | Status: AC
  Administered 2021-06-20: 1000 mL via INTRAVENOUS

## 2021-06-20 MED ORDER — POTASSIUM CHLORIDE CRYS ER 20 MEQ PO TBCR
20.0000 meq | EXTENDED_RELEASE_TABLET | Freq: Once | ORAL | Status: AC
  Administered 2021-06-20: 20 meq via ORAL
  Filled 2021-06-20: qty 1

## 2021-06-20 NOTE — Progress Notes (Addendum)
? ?Subjective: ? ?Overnight Events: No acute events or concerns overnight. ? ?The patient states that she is feeling much better after receiving breathing treatments and steroids. She has no other complaints.  ? ?Objective: ? ?Vital signs in last 24 hours: ?Vitals:  ? 06/20/21 0530 06/20/21 0545 06/20/21 0600 06/20/21 0700  ?BP: 91/67  97/63 103/66  ?Pulse: 74 74 72 79  ?Resp: 17 19 19 19   ?Temp:      ?TempSrc:      ?SpO2: 94% 94% 95% 94%  ?Weight:      ?Height:      ? ?Weight change:  ?No intake or output data in the 24 hours ending 06/20/21 0740 ? ?Physical Exam ?  ?Constitutional: well appearing and sitting in bed, in no acute distress, breathing comfortably on 1L ?HENT: normocephalic atraumatic, mucous membranes moist ?Eyes: pupils equal and round, conjunctiva non-erythematous ?Cardiovascular: regular rate with normal rhythm, no m/r/g ?Pulmonary/Chest: normal work of breathing on room air, lungs clear to auscultation bilaterally ?Abdominal: soft, non-tender, non-distended, bowel sounds present ?MSK: normal bulk and tone ?Skin: warm and dry. ?Neurological: alert and answering questions appropriately. ?Psych: appropriate mood and affect  ? ?Assessment/Plan: ? ?Principal Problem: ?  Asthma exacerbation ?Active Problems: ?  AKI (acute kidney injury) (HCC) ?  Hypokalemia ?  Community acquired pneumonia ? ?Shannon [name] is a 71 year old female with a past medical history of hypertension, hyperlipidemia, TIA, asthma, former smoker, who presents to the emergency room for worsening shortness of breath and fever, found to have a mixed asthma and COPD exacerbation in setting of community acquired pneumonia. ? ?Mixed asthma and COPD exacerbation ?Patient follows with pulmonology as an outpatient with most recent visit 03/16. This is managed medically with Advair, albuterol, montelukast. She states that she was diagnosed with asthma about 20 years ago and that she has a follow-up scheduled with her pulmonologist about 1  month to obtain PFTs.  She is not on home oxygen. Since presenting to the ED she has received Duoneb treatment as well as solu-medrol 125 mg and has had improvement of her dyspnea. Today she states that she is feeling much better and her shortness of breath has resolved.  She is currently satting well on 1 L in the room.  At this time, will continue treatments as below. ?-Continue treatment for CAP as below ?-Prednisone 40 mg daily for 4 more days ?-Scheduled duoneb, q6h ?-Continue home Singulair 10 mg daily ?-Wean oxygen as tolerated ?  ?Community acquired pneumonia ?Patient has had fever and dyspnea for several days now and was noted to have a wedge-shaped density in her L upper lobe on chest x-ray. CT confirmed a focal infiltrate in the medial aspect of the right lower lobe.  Currently on azithromycin and ceftriaxone for presumed CAP.  At this time, she is afebrile and hemodynamically stable.  Preliminary blood cultures show no growth to date. ?-Continue azithromycin 500 mg daily, ceftriaxone 1 g daily for 3 more days ?-Tylenol 650 mg PRN body aches, fever ?-Titrate supplemental oxygen as tolerated ? ?Incidental spiculated lesion on CT chest ?Pt with a 30 pack year smoking history.  In the ED, patient got a chest x-ray showing a wedge-shaped density in the LUL, new from prior exam. CT chest from today reveals a spiculated lesion in the left upper lobe with associated small nodule suspicious for neoplasm till proven otherwise. CT angio neck w/wo contrast from 04/2018 showed emphysema, but there is no mention of a lesion on this imaging.  For  these findings, we consulted pulmonology who will evaluate the patient for possible bronchoscopy. ?-Appreciate PCCM's assistance ?  ?AKI  ?Most recent creatinine of 1.4 today, increased from baseline around 1.1.  Patient endorses poor PO intake since onset of symptoms due to nausea and vomiting associated with attempts at eating, but has tried to stay hydrated. Likely  pre-renal due to hypovolemia. ?-Urinalysis pending ?-Urine sodium pending ?  ?Hypokalemia ?Hypomagnesemia ?Admission CMP remarkable for K of 2.9, magnesium 1.5. Hypokalemia is likely multifactorial including GI losses, breathing treatments, hypomagnesemia. She received repletion of both magnesium and potassium since admit. Her K today is 3.4.  Magnesium improved to 2 today. ?-Trend electrolytes and replete as indicated ? ?Diet: Full ?VTE: Lovenox ?IVF: None ?Code: Full ?  ?Prior to Admission Living Arrangement: Home ?Anticipated Discharge Location: Home ?Barriers to Discharge: Requiring supplemental oxygen for asthma/COPD exacerbation, treatment of CAP ?Dispo: Anticipated discharge in approximately 1-2 day(s).  ? ? LOS: 0 days  ? ?Bishop Limbo, Medical Student ?06/20/2021, 7:40 AM  ? ? ?Attestation for Student Documentation: ? ?I personally was present and performed or re-performed the history, physical exam and medical decision-making activities of this service and have verified that the service and findings are accurately documented in the student?s note. ? ?Andrey Campanile, MD ?06/20/2021, 2:53 PM ? ?

## 2021-06-20 NOTE — ED Notes (Signed)
O2 turned off at this time. Pt O2 maintains at 92-95% on RA. Will continue to monitor. Denies SOB at this time. No acute distress.  ?

## 2021-06-20 NOTE — ED Notes (Signed)
Dr. Cyndie Chime updated on patient blood pressure, no additional orders received at this time.  ?

## 2021-06-20 NOTE — ED Notes (Signed)
Patient ambulated independently to restroom.

## 2021-06-20 NOTE — Consult Note (Signed)
? ?NAME:  Shannon [name], MRN:  [id number], DOB:  November 12, 1950, LOS: 0 ?ADMISSION DATE:  06/19/2021 CONSULTATION DATE:  06/20/2021 ?REFERRING MD:  Mikey Bussing - IMTS CHIEF COMPLAINT:  Asthma exacerbation, CAP, new LUL lesion  ? ?History of Present Illness:  ?71 year old woman who presented to [facility] 5/2 for SOB and fever concerning for asthma exacerbation and CAP. PMHx significant for asthma vs. COPD (followed by [facility] - Dr. Francine Graven), prior tobacco abuse (15 pack years), HTN, HLD, TIA, GERD. ? ?Patient reported 3-day history of feeling unwell with fever at home to Tmax 102.39F.  Patient also reported chills/sweats, body aches, headache, sore throat/sinus pressure since Sunday.  Reports nausea/vomiting with PO intake since yesterday, denies abdominal pain but endorses less PO intake in the setting of her current symptoms. No orthopnea or LE edema. Reported cough productive of white sputum that is blood tinged and increased wheezing for which she required her albuterol inhaler for rescue 3 times/day.  No recent travel or known sick contacts.  ? ?In ED, patient received DuoNebs x 3, Mg and empiric CAP coverage.  CXR demonstrated wedge-shaped density at the left upper lobe. CT Chest was subsequently obtained with new spiculated lesion to left upper lobe and associated small nodule suspicious for neoplasm, focal infiltrate in the medial RLL was noted. ? ?PCCM was consulted for evaluation of asthma exacerbation/CAP/new LUL mass. ? ?Pertinent Medical History:  ? ?Past Medical History:  ?Diagnosis Date  ? Arthritis   ? "right knee" (05/26/2014)  ? Asthma   ? Chronic lower back pain   ? GERD (gastroesophageal reflux disease)   ? HLD (hyperlipidemia)   ? Hypertension   ? TIA (transient ischemic attack) 04/2018  ? ?Significant Hospital Events: ?Including procedures, antibiotic start and stop dates in addition to other pertinent events   ?5/2 - Presented to [facility] for SOB, asthma exacerbation, CAP. CXR with wedge shaped LUL density. CT Chest with  new LUL spiculated lesion, medial RLL infiltrate.  ? ?Interim History / Subjective:  ?PCCM consulted for AECOPD and new LUL spiculated lesion. ? ?Objective:  ?Blood pressure 111/71, pulse 87, temperature 98.2 ?F (36.8 ?C), temperature source Oral, resp. rate (!) 28, height 5\' 6"  (1.676 m), weight 78.5 kg, SpO2 93 %. ?   ?   ?No intake or output data in the 24 hours ending 06/20/21 1401 ?Filed Weights  ? 06/19/21 2126  ?Weight: 78.5 kg  ? ? ?Physical Examination: ?General: Acutely ill-appearing elderly woman, NAD. ?HEENT: [place]/AT, anicteric sclera, PERRL, moist mucous membranes. ?Neuro: Awake, oriented x 3. Responds to verbal stimuli. Following commands consistently. Moves all 4 extremities spontaneously.   ?CV: RRR, no m/g/r. ?PULM: diminished breath sounds, scattered mild wheezing ?GI: Soft, nontender, nondistended. Normoactive bowel sounds. ?Extremities: warm, no LE edema noted. ?Skin: Warm/dry ? ?Resolved Hospital Problem List:  ? ? ?Assessment & Plan:  ?Ms. Cannell is seen in consultation at the request of IMTS for recommendations on further evaluation and management of new LUL lesion in the setting of asthma exacerbation/AECOPD and CAP. ? ?New spiculated LUL lesion ?CT Chest 5/3 with new spiculated lesion in the left upper lobe with associated small ?nodule suspicious for neoplasm; focal infiltrate in medial RLL. ?- Discussion held with patient about concern for malignancy vs pneumonia. She wishes to have repeat CT Chest scan in 6-8 weeks rather than move forward with navigational bronchoscopy. ? ?Asthma exacerbation ?Moderate persistent asthma ?Seen in [facility] 05/03/2021 (Dr. 05/05/2021). Regimen includes: Advair 500-70mcg 1 puff BID, albuterol PRN, Singulair. ?-  Continue supplemental O2 support as needed ?- Wean O2 for sat > 90% ?- Bronchodilators ?- Prednisone taper: 40mg  daily x 3 days, 30mg  daily x 3 days, 20mg  daily x 3 days and 10mg  daily x 3 days ?- Pulmonary hygiene ? ?Community-acquired  PNA ?CXR with wedge-shaped LUL density; CT Chest with lesion as above/medial RLL infiltrate. ?- Agree with empiric CAP coverage with azithromycin, ceftriaxone ?- Intermittent CXR ?- Follow sputum Cx ?- Check urine legionella and strep urine ag ? ?Remainder per primary team. ? ?Thank you for involving [place] in this patient's care. PCCM will continue to follow ? ?Best Practice: (right click and "Reselect all SmartList Selections" daily)  ? ?Per Primary Team ? ?Labs:  ?CBC: ?Recent Labs  ?Lab 06/19/21 ?2142 06/20/21 ?8828  ?WBC 9.1 7.7  ?NEUTROABS 6.9  --   ?HGB 12.2 10.4*  ?HCT 36.5 32.2*  ?MCV 92.9 95.8  ?PLT 240 207  ? ?Basic Metabolic Panel: ?Recent Labs  ?Lab 06/19/21 ?2142 06/20/21 ?0034  ?NA 137 136  ?K 2.9* 3.4*  ?CL 105 108  ?CO2 20* 20*  ?GLUCOSE 145* 232*  ?BUN 8 12  ?CREATININE 1.30* 1.40*  ?CALCIUM 8.6* 7.9*  ?MG 1.5*  --   ? ?GFR: ?Estimated Creatinine Clearance: 38.4 mL/min (A) (by C-G formula based on SCr of 1.4 mg/dL (H)). ?Recent Labs  ?Lab 06/19/21 ?2142 06/20/21 ?9179  ?WBC 9.1 7.7  ?LATICACIDVEN 1.4  --   ? ?Liver Function Tests: ?Recent Labs  ?Lab 06/19/21 ?2142 06/20/21 ?1505  ?AST 25 20  ?ALT 17 14  ?ALKPHOS 71 59  ?BILITOT 1.2 0.4  ?PROT 7.2 5.7*  ?ALBUMIN 3.3* 2.5*  ? ?No results for input(s): LIPASE, AMYLASE in the last 168 hours. ?No results for input(s): AMMONIA in the last 168 hours. ? ?ABG: ?No results found for: PHART, PCO2ART, PO2ART, HCO3, TCO2, ACIDBASEDEF, O2SAT  ? ?Coagulation Profile: ?Recent Labs  ?Lab 06/19/21 ?2142  ?INR 1.0  ? ?Cardiac Enzymes: ?No results for input(s): CKTOTAL, CKMB, CKMBINDEX, TROPONINI in the last 168 hours. ? ?HbA1C: ?Hgb A1c MFr Bld  ?Date/Time Value Ref Range Status  ?06/17/2019 01:19 PM 5.8 (H) 4.8 - 5.6 % Final  ?  Comment:  ?           Prediabetes: 5.7 - 6.4 ?         Diabetes: >6.4 ?         Glycemic control for adults with diabetes: <7.0 ?  ?05/04/2018 10:30 AM 6.4 (H) 4.8 - 5.6 % Final  ?  Comment:  ?  (NOTE) ?Pre diabetes:           5.7%-6.4% ?Diabetes:              >6.4% ?Glycemic control for   <7.0% ?adults with diabetes ?  ? ?CBG: ?No results for input(s): GLUCAP in the last 168 hours. ? ?Review of Systems:   ?Review of systems completed with pertinent positives/negatives outlined in above HPI. ? ?Past Medical History:  ?She,  has a past medical history of Arthritis, Asthma, Chronic lower back pain, GERD (gastroesophageal reflux disease), HLD (hyperlipidemia), Hypertension, and TIA (transient ischemic attack) (04/2018).  ? ?Surgical History:  ? ?Past Surgical History:  ?Procedure Laterality Date  ? ESOPHAGOGASTRODUODENOSCOPY N/A 05/27/2014  ? Procedure: ESOPHAGOGASTRODUODENOSCOPY (EGD);  Surgeon: Jeani Hawking, MD;  Location: [facility];  Service: Endoscopy;  Laterality: N/A;  ? FRACTURE SURGERY    ? PATELLA FRACTURE SURGERY Right ~ 2009  ? TONSILLECTOMY  ~ 1970  ? VAGINAL HYSTERECTOMY  1980's  ? ?Social History:  ? reports that she quit smoking about 22 years ago. Her smoking use included cigarettes. She has a 15.00 pack-year smoking history. She has never used smokeless tobacco. She reports current alcohol use. She reports that she does not use drugs.  ? ?Family History:  ?Her family history includes Colon cancer in her father; Hypertension in her father, mother, and sister.  ? ?Allergies: ?Allergies  ?Allergen Reactions  ? Amoxicillin-Pot Clavulanate Itching  ? ?Home Medications: ?Prior to Admission medications   ?Medication Sig Start Date End Date Taking? Authorizing Provider  ?albuterol (PROVENTIL HFA;VENTOLIN HFA) 108 (90 BASE) MCG/ACT inhaler Inhale 2 puffs into the lungs every 2 (two) hours as needed for wheezing or shortness of breath (cough). 09/21/12  Yes Dammen, Theron Arista, PA-C  ?atorvastatin (LIPITOR) 80 MG tablet Take 1 tablet (80 mg total) by mouth daily at 6 PM. 05/05/18  Yes Nyra Market, MD  ?busPIRone (BUSPAR) 10 MG tablet Take 10 mg by mouth 2 (two) times daily. 05/30/20  Yes [provider]  ?cholecalciferol (VITAMIN  D3) 25 MCG (1000 UT) tablet Take 1,000 Units by mouth daily.   Yes [provider]  ?clopidogrel (PLAVIX) 75 MG tablet Take 1 tablet (75 mg total) by mouth daily. 06/09/18  Yes Micki Riley, MD  ?desloratadin

## 2021-06-20 NOTE — ED Notes (Signed)
Breakfast order placed ?

## 2021-06-21 ENCOUNTER — Encounter (HOSPITAL_COMMUNITY): Payer: Self-pay | Admitting: Internal Medicine

## 2021-06-21 ENCOUNTER — Other Ambulatory Visit: Payer: Self-pay

## 2021-06-21 DIAGNOSIS — J440 Chronic obstructive pulmonary disease with acute lower respiratory infection: Secondary | ICD-10-CM

## 2021-06-21 DIAGNOSIS — J4541 Moderate persistent asthma with (acute) exacerbation: Secondary | ICD-10-CM | POA: Diagnosis not present

## 2021-06-21 DIAGNOSIS — J441 Chronic obstructive pulmonary disease with (acute) exacerbation: Secondary | ICD-10-CM

## 2021-06-21 DIAGNOSIS — J189 Pneumonia, unspecified organism: Secondary | ICD-10-CM | POA: Diagnosis not present

## 2021-06-21 DIAGNOSIS — E876 Hypokalemia: Secondary | ICD-10-CM

## 2021-06-21 LAB — MAGNESIUM: Magnesium: 2.1 mg/dL (ref 1.7–2.4)

## 2021-06-21 LAB — URINE CULTURE

## 2021-06-21 LAB — BASIC METABOLIC PANEL
Anion gap: 6 (ref 5–15)
BUN: 13 mg/dL (ref 8–23)
CO2: 23 mmol/L (ref 22–32)
Calcium: 8.6 mg/dL — ABNORMAL LOW (ref 8.9–10.3)
Chloride: 108 mmol/L (ref 98–111)
Creatinine, Ser: 1.04 mg/dL — ABNORMAL HIGH (ref 0.44–1.00)
GFR, Estimated: 57 mL/min — ABNORMAL LOW (ref >60)
Glucose, Bld: 187 mg/dL — ABNORMAL HIGH (ref 70–99)
Potassium: 4.3 mmol/L (ref 3.5–5.1)
Sodium: 137 mmol/L (ref 135–145)

## 2021-06-21 LAB — PROCALCITONIN: Procalcitonin: 0.25 ng/mL

## 2021-06-21 LAB — LEGIONELLA PNEUMOPHILA SEROGP 1 UR AG: L. pneumophila Serogp 1 Ur Ag: NEGATIVE

## 2021-06-21 MED ORDER — REVEFENACIN 175 MCG/3ML IN SOLN
175.0000 ug | Freq: Every day | RESPIRATORY_TRACT | Status: DC
  Filled 2021-06-21 (×2): qty 3

## 2021-06-21 MED ORDER — AZITHROMYCIN 500 MG PO TABS: 500.0000 mg | ORAL_TABLET | Freq: Every day | ORAL | 0 refills | Status: DC

## 2021-06-21 MED ORDER — CEFDINIR 300 MG PO CAPS: 300.0000 mg | ORAL_CAPSULE | Freq: Two times a day (BID) | ORAL | 0 refills | Status: DC

## 2021-06-21 MED ORDER — PREDNISONE 10 MG PO TABS: ORAL_TABLET | ORAL | 0 refills | Status: AC

## 2021-06-21 MED ORDER — IPRATROPIUM-ALBUTEROL 0.5-2.5 (3) MG/3ML IN SOLN: 3.0000 mL | Freq: Two times a day (BID) | RESPIRATORY_TRACT | Status: DC

## 2021-06-21 MED ORDER — IPRATROPIUM-ALBUTEROL 0.5-2.5 (3) MG/3ML IN SOLN: 3.0000 mL | Freq: Four times a day (QID) | RESPIRATORY_TRACT | Status: DC

## 2021-06-21 MED ORDER — SODIUM CHLORIDE 0.9 % IV SOLN
500.0000 mg | INTRAVENOUS | Status: DC
  Administered 2021-06-21: 500 mg via INTRAVENOUS
  Filled 2021-06-21: qty 5

## 2021-06-21 MED ORDER — IPRATROPIUM-ALBUTEROL 0.5-2.5 (3) MG/3ML IN SOLN: 3.0000 mL | Freq: Four times a day (QID) | RESPIRATORY_TRACT | Status: DC | PRN

## 2021-06-21 MED ORDER — SODIUM CHLORIDE 0.9 % IV SOLN
1.0000 g | INTRAVENOUS | Status: DC
  Administered 2021-06-21: 1 g via INTRAVENOUS
  Filled 2021-06-21: qty 10

## 2021-06-21 MED ORDER — ARFORMOTEROL TARTRATE 15 MCG/2ML IN NEBU: 15.0000 ug | INHALATION_SOLUTION | Freq: Two times a day (BID) | RESPIRATORY_TRACT | Status: DC

## 2021-06-21 NOTE — Plan of Care (Signed)

## 2021-06-21 NOTE — Progress Notes (Signed)
Admitted from emergency room via stretcher to rm 28 on  [address]. Reports feeling short breath 02 sat 97 percent room air. Coughing frequent to occassional dry strong non productive. Tele shows sinus rhythm.  ?

## 2021-06-21 NOTE — H&P (View-Only) (Signed)
NAME:  Shannon [name], MRN:  [id number], DOB:  [birth year], LOS: 0 ADMISSION DATE:  06/19/2021 CONSULTATION DATE:  06/20/2021 REFERRING MD:  Mikey Bussing - IMTS CHIEF COMPLAINT:  Asthma exacerbation, CAP, new LUL lesion   History of Present Illness:  71 year old woman who presented to [facility] 5/2 for SOB and fever concerning for asthma exacerbation and CAP. PMHx significant for asthma vs. COPD (followed by [facility] - Dr. Francine Graven), prior tobacco abuse (15 pack years), HTN, HLD, TIA, GERD.  Patient reported 3-day history of feeling unwell with fever at home to Tmax 102.72F.  Patient also reported chills/sweats, body aches, headache, sore throat/sinus pressure since Sunday.  Reports nausea/vomiting with PO intake since yesterday, denies abdominal pain but endorses less PO intake in the setting of her current symptoms. No orthopnea or LE edema. Reported cough productive of white sputum that is blood tinged and increased wheezing for which she required her albuterol inhaler for rescue 3 times/day.  No recent travel or known sick contacts.   In ED, patient received DuoNebs x 3, Mg and empiric CAP coverage.  CXR demonstrated wedge-shaped density at the left upper lobe. CT Chest was subsequently obtained with new spiculated lesion to left upper lobe and associated small nodule suspicious for neoplasm, focal infiltrate in the medial RLL was noted.  PCCM was consulted for evaluation of asthma exacerbation/CAP/new LUL mass.  Pertinent Medical History:   Past Medical History:  Diagnosis Date   Arthritis    "right knee" (05/26/2014)   Asthma    Chronic lower back pain    GERD (gastroesophageal reflux disease)    HLD (hyperlipidemia)    Hypertension    TIA (transient ischemic attack) 04/2018   Significant Hospital Events: Including procedures, antibiotic start and stop dates in addition to other pertinent events   5/2 - Presented to [facility] for SOB, asthma exacerbation, CAP. CXR with wedge shaped LUL density. CT Chest with  new LUL spiculated lesion, medial RLL infiltrate.   Interim History / Subjective:   Patient feeling much better today.   Denies sputum production or wheezing.  Objective:  Blood pressure 113/73, pulse 84, temperature 97.7 F (36.5 C), temperature source Oral, resp. rate (!) 22, height 5\' 6"  (1.676 m), weight 78.5 kg, SpO2 91 %.        Intake/Output Summary (Last 24 hours) at 06/21/2021 1309 Last data filed at 06/21/2021 0500 Gross per 24 hour  Intake --  Output 300 ml  Net -300 ml   Filed Weights   06/19/21 2126  Weight: 78.5 kg    Physical Examination: General: no acute distress, resting in bed HEENT: [place]/AT, moist mucous membranes, sclera anicteric Neuro: A&O x 3, moving all extremities CV: rrr, s1s2, no murmurs PULM: diminished breath sounds. No wheezing GI: soft, non-tender, non-distended, BS+ Extremities: warm, no edema Skin: no rashes   Resolved Hospital Problem List:    Assessment & Plan:   New spiculated LUL lesion CT Chest 5/3 with new spiculated lesion in the left upper lobe with associated small nodule suspicious for neoplasm; focal infiltrate in medial RLL. - Discussion held with patient about concern for malignancy vs pneumonia. She now wishes to move forward with navigational bronchoscopy sooner than later.  - I will reach out to Dr. 7/3 to schedule her for procedure. She is on plavix which will need to be held 5 days prior to procedure.  Asthma exacerbation Moderate persistent asthma Seen in [facility] 05/03/2021 (Dr. 05/05/2021). Regimen includes: Advair 500-68mcg 1 puff BID, albuterol  PRN, Singulair. - Continue supplemental O2 support as needed - Wean O2 for sat > 90% - Nebs with yupelri and brovana, may resume advaiar upon discharge - Prednisone taper: 40mg  daily x 3 days, 30mg  daily x 3 days, 20mg  daily x 3 days and 10mg  daily x 3 days - Pulmonary hygiene  Community-acquired PNA CXR with wedge-shaped LUL density; CT Chest with lesion  as above/medial RLL infiltrate. - Agree with empiric CAP coverage with azithromycin, ceftriaxone - Intermittent CXR - Follow sputum Cx - Check urine legionella and strep urine ag  Remainder per primary team.  PCCM will sign off.   Best Practice: (right click and "Reselect all SmartList Selections" daily)   Per Primary Team  Labs:  CBC: Recent Labs  Lab 06/19/21 2142 06/20/21 0511  WBC 9.1 7.7  NEUTROABS 6.9  --   HGB 12.2 10.4*  HCT 36.5 32.2*  MCV 92.9 95.8  PLT 240 207   Basic Metabolic Panel: Recent Labs  Lab 06/19/21 2142 06/20/21 0511 06/21/21 0455  NA 137 136 137  K 2.9* 3.4* 4.3  CL 105 108 108  CO2 20* 20* 23  GLUCOSE 145* 232* 187*  BUN 8 12 13   CREATININE 1.30* 1.40* 1.04*  CALCIUM 8.6* 7.9* 8.6*  MG 1.5* 2.0 2.1   GFR: Estimated Creatinine Clearance: 51.7 mL/min (A) (by C-G formula based on SCr of 1.04 mg/dL (H)). Recent Labs  Lab 06/19/21 2142 06/20/21 0511 06/21/21 0455  PROCALCITON  --   --  0.25  WBC 9.1 7.7  --   LATICACIDVEN 1.4  --   --    Liver Function Tests: Recent Labs  Lab 06/19/21 2142 06/20/21 0511  AST 25 20  ALT 17 14  ALKPHOS 71 59  BILITOT 1.2 0.4  PROT 7.2 5.7*  ALBUMIN 3.3* 2.5*   No results for input(s): LIPASE, AMYLASE in the last 168 hours. No results for input(s): AMMONIA in the last 168 hours.  ABG: No results found for: PHART, PCO2ART, PO2ART, HCO3, TCO2, ACIDBASEDEF, O2SAT   Coagulation Profile: Recent Labs  Lab 06/19/21 2142  INR 1.0   Cardiac Enzymes: No results for input(s): CKTOTAL, CKMB, CKMBINDEX, TROPONINI in the last 168 hours.  HbA1C: Hgb A1c MFr Bld  Date/Time Value Ref Range Status  06/17/2019 01:19 PM 5.8 (H) 4.8 - 5.6 % Final    Comment:             Prediabetes: 5.7 - 6.4          Diabetes: >6.4          Glycemic control for adults with diabetes: <7.0   05/04/2018 10:30 AM 6.4 (H) 4.8 - 5.6 % Final    Comment:    (NOTE) Pre diabetes:          5.7%-6.4% Diabetes:               >6.4% Glycemic control for   <7.0% adults with diabetes    CBG: No results for input(s): GLUCAP in the last 168 hours.   Critical care time: n/a   2143, MD [place] Office: [phone]   See Amion for personal pager PCCM on call pager [phone] until 7pm. Please call Elink 7p-7a. [phone]

## 2021-06-21 NOTE — Progress Notes (Signed)
 NAME:  Shannon [name], MRN:  [id number], DOB:  07/17/1950, LOS: 0 ADMISSION DATE:  06/19/2021 CONSULTATION DATE:  06/20/2021 REFERRING MD:  Hoffman - IMTS CHIEF COMPLAINT:  Asthma exacerbation, CAP, new LUL lesion   History of Present Illness:  71-year-old woman who presented to [facility] 5/2 for SOB and fever concerning for asthma exacerbation and CAP. PMHx significant for asthma vs. COPD (followed by [facility] - Dr. Orlanda Frankum), prior tobacco abuse (15 pack years), HTN, HLD, TIA, GERD.  Patient reported 3-day history of feeling unwell with fever at home to Tmax 102.4F.  Patient also reported chills/sweats, body aches, headache, sore throat/sinus pressure since Sunday.  Reports nausea/vomiting with PO intake since yesterday, denies abdominal pain but endorses less PO intake in the setting of her current symptoms. No orthopnea or LE edema. Reported cough productive of white sputum that is blood tinged and increased wheezing for which she required her albuterol inhaler for rescue 3 times/day.  No recent travel or known sick contacts.   In ED, patient received DuoNebs x 3, Mg and empiric CAP coverage.  CXR demonstrated wedge-shaped density at the left upper lobe. CT Chest was subsequently obtained with new spiculated lesion to left upper lobe and associated small nodule suspicious for neoplasm, focal infiltrate in the medial RLL was noted.  PCCM was consulted for evaluation of asthma exacerbation/CAP/new LUL mass.  Pertinent Medical History:   Past Medical History:  Diagnosis Date   Arthritis    "right knee" (05/26/2014)   Asthma    Chronic lower back pain    GERD (gastroesophageal reflux disease)    HLD (hyperlipidemia)    Hypertension    TIA (transient ischemic attack) 04/2018   Significant Hospital Events: Including procedures, antibiotic start and stop dates in addition to other pertinent events   5/2 - Presented to [facility] for SOB, asthma exacerbation, CAP. CXR with wedge shaped LUL density. CT Chest with  new LUL spiculated lesion, medial RLL infiltrate.   Interim History / Subjective:   Patient feeling much better today.   Denies sputum production or wheezing.  Objective:  Blood pressure 113/73, pulse 84, temperature 97.7 F (36.5 C), temperature source Oral, resp. rate (!) 22, height 5' 6" (1.676 m), weight 78.5 kg, SpO2 91 %.        Intake/Output Summary (Last 24 hours) at 06/21/2021 1309 Last data filed at 06/21/2021 0500 Gross per 24 hour  Intake --  Output 300 ml  Net -300 ml   Filed Weights   06/19/21 2126  Weight: 78.5 kg    Physical Examination: General: no acute distress, resting in bed HEENT: [place]/AT, moist mucous membranes, sclera anicteric Neuro: A&O x 3, moving all extremities CV: rrr, s1s2, no murmurs PULM: diminished breath sounds. No wheezing GI: soft, non-tender, non-distended, BS+ Extremities: warm, no edema Skin: no rashes   Resolved Hospital Problem List:    Assessment & Plan:   New spiculated LUL lesion CT Chest 5/3 with new spiculated lesion in the left upper lobe with associated small nodule suspicious for neoplasm; focal infiltrate in medial RLL. - Discussion held with patient about concern for malignancy vs pneumonia. She now wishes to move forward with navigational bronchoscopy sooner than later.  - I will reach out to Dr. Icard to schedule her for procedure. She is on plavix which will need to be held 5 days prior to procedure.  Asthma exacerbation Moderate persistent asthma Seen in [facility] 05/03/2021 (Dr. Jaskirat Schwieger). Regimen includes: Advair 500-50mcg 1 puff BID, albuterol   PRN, Singulair. - Continue supplemental O2 support as needed - Wean O2 for sat > 90% - Nebs with yupelri and brovana, may resume advaiar upon discharge - Prednisone taper: 40mg daily x 3 days, 30mg daily x 3 days, 20mg daily x 3 days and 10mg daily x 3 days - Pulmonary hygiene  Community-acquired PNA CXR with wedge-shaped LUL density; CT Chest with lesion  as above/medial RLL infiltrate. - Agree with empiric CAP coverage with azithromycin, ceftriaxone - Intermittent CXR - Follow sputum Cx - Check urine legionella and strep urine ag  Remainder per primary team.  PCCM will sign off.   Best Practice: (right click and "Reselect all SmartList Selections" daily)   Per Primary Team  Labs:  CBC: Recent Labs  Lab 06/19/21 2142 06/20/21 0511  WBC 9.1 7.7  NEUTROABS 6.9  --   HGB 12.2 10.4*  HCT 36.5 32.2*  MCV 92.9 95.8  PLT 240 207   Basic Metabolic Panel: Recent Labs  Lab 06/19/21 2142 06/20/21 0511 06/21/21 0455  NA 137 136 137  K 2.9* 3.4* 4.3  CL 105 108 108  CO2 20* 20* 23  GLUCOSE 145* 232* 187*  BUN 8 12 13  CREATININE 1.30* 1.40* 1.04*  CALCIUM 8.6* 7.9* 8.6*  MG 1.5* 2.0 2.1   GFR: Estimated Creatinine Clearance: 51.7 mL/min (A) (by C-G formula based on SCr of 1.04 mg/dL (H)). Recent Labs  Lab 06/19/21 2142 06/20/21 0511 06/21/21 0455  PROCALCITON  --   --  0.25  WBC 9.1 7.7  --   LATICACIDVEN 1.4  --   --    Liver Function Tests: Recent Labs  Lab 06/19/21 2142 06/20/21 0511  AST 25 20  ALT 17 14  ALKPHOS 71 59  BILITOT 1.2 0.4  PROT 7.2 5.7*  ALBUMIN 3.3* 2.5*   No results for input(s): LIPASE, AMYLASE in the last 168 hours. No results for input(s): AMMONIA in the last 168 hours.  ABG: No results found for: PHART, PCO2ART, PO2ART, HCO3, TCO2, ACIDBASEDEF, O2SAT   Coagulation Profile: Recent Labs  Lab 06/19/21 2142  INR 1.0   Cardiac Enzymes: No results for input(s): CKTOTAL, CKMB, CKMBINDEX, TROPONINI in the last 168 hours.  HbA1C: Hgb A1c MFr Bld  Date/Time Value Ref Range Status  06/17/2019 01:19 PM 5.8 (H) 4.8 - 5.6 % Final    Comment:             Prediabetes: 5.7 - 6.4          Diabetes: >6.4          Glycemic control for adults with diabetes: <7.0   05/04/2018 10:30 AM 6.4 (H) 4.8 - 5.6 % Final    Comment:    (NOTE) Pre diabetes:          5.7%-6.4% Diabetes:               >6.4% Glycemic control for   <7.0% adults with diabetes    CBG: No results for input(s): GLUCAP in the last 168 hours.   Critical care time: n/a   Friedrich Harriott, MD [facility] Office: [phone]   See Amion for personal pager PCCM on call pager [phone] until 7pm. Please call Elink 7p-7a. [phone]  

## 2021-06-21 NOTE — Discharge Summary (Addendum)
? ?Name: Shannon [name] ?MRN: [id number] ?DOB: 17-Dec-1950 71 y.o. ?PCP: Sherral Hammers, FNP ? ?Date of Admission: 06/19/2021  9:24 PM ?Date of Discharge:   06/21/2021 ?Attending Physician: Gust Rung, DO ? ?Discharge Diagnosis: ?1. Mixed asthma and COPD exacerbation ?2. Community Acquired Pneumonia ?3. Incidental spiculated lesion on chest CT ?4. AKI ?5. Hypokalemia, Hypomagnesemia ? ? ?Discharge Medications: ?Allergies as of 06/21/2021   ? ?   Reactions  ? Amoxicillin-pot Clavulanate Itching  ? ?  ? ?  ?Medication List  ?  ? ?TAKE these medications   ? ?albuterol 108 (90 Base) MCG/ACT inhaler ?Commonly known as: VENTOLIN HFA ?Inhale 2 puffs into the lungs every 2 (two) hours as needed for wheezing or shortness of breath (cough). ?  ?atorvastatin 80 MG tablet ?Commonly known as: LIPITOR ?Take 1 tablet (80 mg total) by mouth daily at 6 PM. ?  ?busPIRone 10 MG tablet ?Commonly known as: BUSPAR ?Take 10 mg by mouth 2 (two) times daily. ?  ?cefdinir 300 MG capsule ?Commonly known as: OMNICEF ?Take 1 capsule (300 mg total) by mouth 2 (two) times daily. ?  ?cholecalciferol 25 MCG (1000 UNIT) tablet ?Commonly known as: VITAMIN D3 ?Take 1,000 Units by mouth daily. ?  ?clopidogrel 75 MG tablet ?Commonly known as: PLAVIX ?Take 1 tablet (75 mg total) by mouth daily. ?  ?desloratadine 5 MG tablet ?Commonly known as: CLARINEX ?Take 5 mg by mouth daily. ?  ?ezetimibe 10 MG tablet ?Commonly known as: ZETIA ?Take 10 mg by mouth daily. ?  ?FLUoxetine 20 MG capsule ?Commonly known as: PROZAC ?Take 20 mg by mouth daily. ?  ?fluticasone 50 MCG/ACT nasal spray ?Commonly known as: FLONASE ?Place 1 spray into both nostrils daily. ?  ?Fluticasone-Salmeterol 500-50 MCG/DOSE Aepb ?Commonly known as: ADVAIR ?Inhale 1 puff into the lungs 2 (two) times daily. ?  ?gabapentin 600 MG tablet ?Commonly known as: NEURONTIN ?Take 600 mg by mouth 3 (three) times daily. ?  ?meloxicam 7.5 MG tablet ?Commonly known as: MOBIC ?Take 7.5 mg by mouth  daily. ?  ?montelukast 10 MG tablet ?Commonly known as: SINGULAIR ?Take 1 tablet (10 mg total) by mouth at bedtime. ?  ?pantoprazole 40 MG tablet ?Commonly known as: Protonix ?Take 1 tablet (40 mg total) by mouth daily. ?  ?predniSONE 10 MG tablet ?Commonly known as: DELTASONE ?Take 4 tablets (40 mg total) by mouth daily for 1 day, THEN 3 tablets (30 mg total) daily for 3 days, THEN 2 tablets (20 mg total) daily for 3 days, THEN 1 tablet (10 mg total) daily for 3 days. ?Start taking on: Jun 21, 2021 ?  ?valsartan 80 MG tablet ?Commonly known as: DIOVAN ?Take 80 mg by mouth daily. ?  ? ?  ? ? ?Disposition and follow-up:   ?Shannon [name] was discharged from [facility] in Good condition.  At the hospital follow up visit please address: ? ?1.  Incidental spiculated lesion on chest CT: See below for details.  She has elected to proceed with bronchoscopy, so pulmonology will set her up with Dr Tonia Brooms for outpatient navigational bronchoscopy.  Plavix will need to be held for 5 days prior to her procedure. ? ?Mixed asthma and COPD exacerbation: Patient will finish prednisone taper at home, and will otherwise continue Advair, albuterol, and montelukast. ? ?Community-acquired pneumonia: Patient will finish her cefdinir regimen at home (2 more days after today). ? ?2.  Labs / imaging needed at time of follow-up: BMP, magnesium ? ?3.  Pending labs/ test needing follow-up: Urine sodium, UA, legionella ? ?Follow-up Appointments: ? ? ?Hospital Course by problem list: ?1. Mixed asthma and COPD exacerbation ?Patient follows with pulmonology as an outpatient with most recent visit 03/16. Her asthma/COPD is managed medically with Advair, albuterol, montelukast. She states that she was diagnosed with asthma about 20 years ago and that she has a follow-up scheduled with her pulmonologist about 1 month to obtain PFTs.  She is not on home oxygen. Since presenting to the ED she has received Duoneb treatments as well  as solu-medrol 125 mg and has had improvement of her dyspnea. Today she states that she is feeling much better and her shortness of breath has completely resolved.  She was initially placed on 2L nasal cannula for low oxygen saturation, but now has saturations >96% on room air. Ambulatory pulse oximetry >95% on room air. Plan to continue using Advair, albuterol, montelukast at home with pulmonology follow-up.  She will also finish a prednisone taper at home: 40mg  daily x 3 days, 30mg  daily x 3 days, 20mg  daily x 3 days and 10mg  daily x 3 days ? ?2. Community Acquired Pneumonia ?Patient had fever and dyspnea for several days prior to admission and was noted to have a wedge-shaped density in her L upper lobe on chest x-ray. CT confirmed a focal infiltrate in the medial aspect of the right lower lobe.  Currently on azithromycin and ceftriaxone for presumed CAP.  At this time, she is afebrile and hemodynamically stable.  Blood cultures were negative. Pt will continue cefdinir regimen at home (2 more days). She can also use Tylenol PRN body aches, fever. She initially required 2L supplemental oxygen but now has oxygen saturations >96% on room air.  ? ?3. Incidental spiculated lesion on chest CT ?Pt with a 30 pack year smoking history.  In the ED, patient got a chest x-ray showing a wedge-shaped density in the LUL, new from prior exam. CT chest from today reveals a spiculated lesion in the left upper lobe with associated small nodule suspicious for neoplasm till proven otherwise. CT angio neck w/wo contrast from 04/2018 showed emphysema, but there is no mention of a lesion on this imaging.  Pulmonology has seen the patient and offered her bronchoscopy versus repeat CT scan in 6-8 months. She has elected to proceed with bronchoscopy, so pulmonology will set her up with Dr for outpatient navigational bronchoscopy.  ? ?4. AKI ?Patient with baseline creatinine around 1.1 that was elevated to 1.3 on admission up to 1.4  on hospital day 1 likely secondary to poor PO intake since onset of symptoms due to nausea and vomiting associated with attempts at eating. Likely pre-renal due to hypovolemia. Creatinine has now returned to 1.05. Will need repeat BMP at next PCP follow-up. ? ?5. Hypokalemia, Hypomagnesemia ?Admission CMP was remarkable for K of 2.9, magnesium 1.5. Hypokalemia was likely multifactorial including GI losses, breathing treatments, hypomagnesemia. She received repletion of both magnesium and potassium since admit. Potassium today is 4.3. Magnesium is 2.0. Plan to recheck electrolytes at patient's next outpatient PCP visit. ? ?Discharge Exam:   ?BP 113/73 (BP Location: Right Arm)   Pulse 84   Temp 97.7 ?F (36.5 ?C) (Oral)   Resp (!) 22   Ht 5\' 6"  (1.676 m)   Wt 78.5 kg   SpO2 91%   BMI 27.93 kg/m?  ?Discharge exam:  ?Constitutional: well appearing and sitting in bed, in no acute distress, breathing comfortably on room air ?HENT: normocephalic  atraumatic, mucous membranes moist ?Eyes: pupils equal and round, conjunctiva non-erythematous ?Cardiovascular: regular rate with normal rhythm, no m/r/g ?Pulmonary/Chest: normal work of breathing on room air, lungs clear to auscultation bilaterally ?Abdominal: soft, non-tender, non-distended, bowel sounds present ?MSK: normal bulk and tone ?Skin: warm and dry. ?Neurological: alert and answering questions appropriately. ?Psych: appropriate mood and affect  ? ?Pertinent Labs, Studies, and Procedures:  ? ?CT CHEST WO CONTRAST ? ?Result Date: 06/20/2021 ?CLINICAL DATA:  New left upper lobe wedge-shaped density. EXAM: CT CHEST WITHOUT CONTRAST TECHNIQUE: Multidetector CT imaging of the chest was performed following the standard protocol without IV contrast. RADIATION DOSE REDUCTION: This exam was performed according to the departmental dose-optimization program which includes automated exposure control, adjustment of the mA and/or kV according to patient size and/or use of  iterative reconstruction technique. COMPARISON:  Chest x-ray from the previous day. FINDINGS: Cardiovascular: Limited due to lack of IV contrast. Atherosclerotic calcifications are noted without aneurysmal dilatation

## 2021-06-21 NOTE — Progress Notes (Signed)
?  Transition of Care (TOC) Screening Note ? ? ?Patient Details  ?Name: Shannon [name] ?Date of Birth: 04-25-1950 ? ? ?Transition of Care (TOC) CM/SW Contact:    ?Mearl Latin, LCSW ?Phone Number: ?06/21/2021, 9:35 AM ? ? ? ?Transition of Care Department [facility]) has reviewed patient and no TOC needs have been identified at this time. We will continue to monitor patient advancement through interdisciplinary progression rounds. If new patient transition needs arise, please place a TOC consult. ? ? ?

## 2021-06-21 NOTE — Progress Notes (Signed)
SATURATION QUALIFICATIONS: (This note is used to comply with regulatory documentation for home oxygen) ? ?Patient Saturations on Room Air at Rest = 93% ? ?Patient Saturations on Room Air while Ambulating = 92% ? ?Patient Saturations on 0 Liters of oxygen while Ambulating = 92% ? ?Please briefly explain why patient needs home oxygen: ? ?Saturation 92% on RA while ambulating ?

## 2021-06-24 LAB — CULTURE, BLOOD (ROUTINE X 2)
Culture: NO GROWTH
Culture: NO GROWTH
Special Requests: ADEQUATE
Special Requests: ADEQUATE

## 2021-06-25 ENCOUNTER — Telehealth: Payer: Self-pay | Admitting: Pulmonary Disease

## 2021-06-25 DIAGNOSIS — R911 Solitary pulmonary nodule: Secondary | ICD-10-CM

## 2021-06-25 NOTE — Telephone Encounter (Signed)
PCCM: ? ?Case discussed with Dr. Francine Graven.  Patient was recently hospitalized found to have a CT scan with a new left upper lobe pulmonary nodule concerning for malignancy.  Recommendations for bronchoscopy and tissue sampling. ? ?Patient is on Plavix. ? ?Last dose Plavix 07/04/2021. ? ?Tentative bronchoscopy date on 07/10/2021. ? ?Orders have been placed. ? ?Josephine Igo, DO ?[facility] ?06/25/2021 5:58 PM   ? ?

## 2021-06-27 NOTE — Telephone Encounter (Signed)
Pt has been scheduled for 5/23.  I have spoken to her and gone over appt info.  Nothing further needed. ?

## 2021-07-06 ENCOUNTER — Other Ambulatory Visit: Payer: Self-pay

## 2021-07-06 ENCOUNTER — Encounter (HOSPITAL_COMMUNITY): Payer: Self-pay | Admitting: Pulmonary Disease

## 2021-07-06 ENCOUNTER — Other Ambulatory Visit: Payer: Self-pay | Admitting: Pulmonary Disease

## 2021-07-06 LAB — SARS CORONAVIRUS 2 (TAT 6-24 HRS): SARS Coronavirus 2: NEGATIVE

## 2021-07-06 NOTE — Progress Notes (Signed)
Spoke with pt for pre-op call. Pt denies cardiac history. States she does have an irregular heart beat. Pt states she is not diabetic. Recently hospitalized with pneumonia. States she is feeling much better, has finished the prednisone that she was taking.   Covid test done today and it's negative.   Shower instructions given to pt.

## 2021-07-10 ENCOUNTER — Encounter (HOSPITAL_COMMUNITY): Payer: Self-pay | Admitting: Pulmonary Disease

## 2021-07-10 ENCOUNTER — Other Ambulatory Visit: Payer: Self-pay

## 2021-07-10 ENCOUNTER — Ambulatory Visit (HOSPITAL_COMMUNITY)
Admission: RE | Admit: 2021-07-10 | Discharge: 2021-07-10 | Disposition: A | Discharge status: Home / Self Care | Payer: Medicare HMO | Attending: Pulmonary Disease | Admitting: Pulmonary Disease

## 2021-07-10 ENCOUNTER — Ambulatory Visit (HOSPITAL_COMMUNITY): Payer: Medicare HMO | Admitting: Anesthesiology

## 2021-07-10 ENCOUNTER — Ambulatory Visit (HOSPITAL_COMMUNITY): Payer: Medicare HMO

## 2021-07-10 ENCOUNTER — Ambulatory Visit (HOSPITAL_BASED_OUTPATIENT_CLINIC_OR_DEPARTMENT_OTHER): Payer: Medicare HMO | Admitting: Anesthesiology

## 2021-07-10 ENCOUNTER — Encounter (HOSPITAL_COMMUNITY)
Admission: RE | Disposition: A | Discharge status: Home / Self Care | Payer: Self-pay | Source: Home / Self Care | Attending: Pulmonary Disease

## 2021-07-10 DIAGNOSIS — J029 Acute pharyngitis, unspecified: Secondary | ICD-10-CM | POA: Diagnosis not present

## 2021-07-10 DIAGNOSIS — Z87891 Personal history of nicotine dependence: Secondary | ICD-10-CM | POA: Diagnosis not present

## 2021-07-10 DIAGNOSIS — K219 Gastro-esophageal reflux disease without esophagitis: Secondary | ICD-10-CM | POA: Diagnosis not present

## 2021-07-10 DIAGNOSIS — I10 Essential (primary) hypertension: Secondary | ICD-10-CM

## 2021-07-10 DIAGNOSIS — R112 Nausea with vomiting, unspecified: Secondary | ICD-10-CM | POA: Diagnosis not present

## 2021-07-10 DIAGNOSIS — R911 Solitary pulmonary nodule: Secondary | ICD-10-CM | POA: Diagnosis present

## 2021-07-10 DIAGNOSIS — R509 Fever, unspecified: Secondary | ICD-10-CM | POA: Diagnosis not present

## 2021-07-10 DIAGNOSIS — E785 Hyperlipidemia, unspecified: Secondary | ICD-10-CM | POA: Insufficient documentation

## 2021-07-10 DIAGNOSIS — J449 Chronic obstructive pulmonary disease, unspecified: Secondary | ICD-10-CM

## 2021-07-10 DIAGNOSIS — M1990 Unspecified osteoarthritis, unspecified site: Secondary | ICD-10-CM | POA: Insufficient documentation

## 2021-07-10 DIAGNOSIS — Z8673 Personal history of transient ischemic attack (TIA), and cerebral infarction without residual deficits: Secondary | ICD-10-CM

## 2021-07-10 DIAGNOSIS — F419 Anxiety disorder, unspecified: Secondary | ICD-10-CM | POA: Insufficient documentation

## 2021-07-10 DIAGNOSIS — R519 Headache, unspecified: Secondary | ICD-10-CM | POA: Diagnosis not present

## 2021-07-10 DIAGNOSIS — J4541 Moderate persistent asthma with (acute) exacerbation: Secondary | ICD-10-CM | POA: Diagnosis not present

## 2021-07-10 HISTORY — PX: BRONCHIAL NEEDLE ASPIRATION BIOPSY: SHX5106

## 2021-07-10 HISTORY — PX: VIDEO BRONCHOSCOPY WITH RADIAL ENDOBRONCHIAL ULTRASOUND: SHX6849

## 2021-07-10 HISTORY — DX: Pneumonia, unspecified organism: J18.9

## 2021-07-10 HISTORY — PX: BRONCHIAL WASHINGS: SHX5105

## 2021-07-10 HISTORY — PX: BRONCHIAL BIOPSY: SHX5109

## 2021-07-10 HISTORY — DX: Cerebral infarction, unspecified: I63.9

## 2021-07-10 HISTORY — DX: Anxiety disorder, unspecified: F41.9

## 2021-07-10 HISTORY — PX: BRONCHIAL BRUSHINGS: SHX5108

## 2021-07-10 HISTORY — DX: Cardiac arrhythmia, unspecified: I49.9

## 2021-07-10 SURGERY — BRONCHOSCOPY, WITH BIOPSY USING ELECTROMAGNETIC NAVIGATION
Anesthesia: General

## 2021-07-10 MED ORDER — DEXAMETHASONE SODIUM PHOSPHATE 4 MG/ML IJ SOLN
INTRAMUSCULAR | Status: DC | PRN
  Administered 2021-07-10: 4 mg via INTRAVENOUS

## 2021-07-10 MED ORDER — ROCURONIUM BROMIDE 10 MG/ML (PF) SYRINGE
PREFILLED_SYRINGE | INTRAVENOUS | Status: DC | PRN
  Administered 2021-07-10: 50 mg via INTRAVENOUS

## 2021-07-10 MED ORDER — CHLORHEXIDINE GLUCONATE 0.12 % MT SOLN: 15.0000 mL | OROMUCOSAL | Status: AC

## 2021-07-10 MED ORDER — LIDOCAINE 2% (20 MG/ML) 5 ML SYRINGE
INTRAMUSCULAR | Status: DC | PRN
  Administered 2021-07-10: 60 mg via INTRAVENOUS

## 2021-07-10 MED ORDER — CHLORHEXIDINE GLUCONATE 0.12 % MT SOLN
OROMUCOSAL | Status: AC
  Administered 2021-07-10: 15 mL via OROMUCOSAL
  Filled 2021-07-10: qty 15

## 2021-07-10 MED ORDER — FENTANYL CITRATE (PF) 100 MCG/2ML IJ SOLN
INTRAMUSCULAR | Status: DC | PRN
  Administered 2021-07-10 (×2): 50 ug via INTRAVENOUS

## 2021-07-10 MED ORDER — LACTATED RINGERS IV SOLN: INTRAVENOUS | Status: DC

## 2021-07-10 MED ORDER — PROPOFOL 500 MG/50ML IV EMUL
INTRAVENOUS | Status: DC | PRN
  Administered 2021-07-10: 150 ug/kg/min via INTRAVENOUS

## 2021-07-10 MED ORDER — ACETAMINOPHEN 500 MG PO TABS
1000.0000 mg | ORAL_TABLET | Freq: Once | ORAL | Status: AC
  Administered 2021-07-10: 1000 mg via ORAL
  Filled 2021-07-10: qty 2

## 2021-07-10 MED ORDER — ONDANSETRON HCL 4 MG/2ML IJ SOLN
INTRAMUSCULAR | Status: DC | PRN
  Administered 2021-07-10: 4 mg via INTRAVENOUS

## 2021-07-10 MED ORDER — PROPOFOL 10 MG/ML IV BOLUS
INTRAVENOUS | Status: DC | PRN
  Administered 2021-07-10: 150 mg via INTRAVENOUS
  Administered 2021-07-10: 30 mg via INTRAVENOUS

## 2021-07-10 MED ORDER — FENTANYL CITRATE (PF) 100 MCG/2ML IJ SOLN: 25.0000 ug | INTRAMUSCULAR | Status: DC | PRN

## 2021-07-10 MED ORDER — SUGAMMADEX SODIUM 200 MG/2ML IV SOLN
INTRAVENOUS | Status: DC | PRN
  Administered 2021-07-10: 200 mg via INTRAVENOUS

## 2021-07-10 NOTE — Anesthesia Preprocedure Evaluation (Signed)
Anesthesia Evaluation  Patient identified by MRN, date of birth, ID band Patient awake    Reviewed: Allergy & Precautions, H&P , NPO status , Patient's Chart, lab work & pertinent test results  Airway Mallampati: II  TM Distance: >3 FB Neck ROM: Full    Dental no notable dental hx. (+) Teeth Intact, Dental Advisory Given   Pulmonary asthma , COPD,  COPD inhaler, former smoker,    Pulmonary exam normal breath sounds clear to auscultation       Cardiovascular hypertension, Pt. on medications + dysrhythmias  Rhythm:Regular Rate:Normal     Neuro/Psych Anxiety TIACVA, No Residual Symptoms    GI/Hepatic Neg liver ROS, GERD  Medicated,  Endo/Other  negative endocrine ROS  Renal/GU Renal InsufficiencyRenal disease  negative genitourinary   Musculoskeletal  (+) Arthritis , Osteoarthritis,    Abdominal   Peds  Hematology negative hematology ROS (+)   Anesthesia Other Findings   Reproductive/Obstetrics negative OB ROS                             Anesthesia Physical Anesthesia Plan  ASA: 3  Anesthesia Plan: General   Post-op Pain Management: Tylenol PO (pre-op)*   Induction: Intravenous  PONV Risk Score and Plan: 4 or greater and Ondansetron and Dexamethasone  Airway Management Planned: Oral ETT  Additional Equipment:   Intra-op Plan:   Post-operative Plan: Extubation in OR  Informed Consent: I have reviewed the patients History and Physical, chart, labs and discussed the procedure including the risks, benefits and alternatives for the proposed anesthesia with the patient or authorized representative who has indicated his/her understanding and acceptance.     Dental advisory given  Plan Discussed with: CRNA  Anesthesia Plan Comments:         Anesthesia Quick Evaluation

## 2021-07-10 NOTE — Anesthesia Procedure Notes (Signed)
Procedure Name: Intubation Date/Time: 07/10/2021 8:58 AM Performed by: Lieutenant Diego, CRNA Pre-anesthesia Checklist: Patient identified, Emergency Drugs available, Suction available and Patient being monitored Patient Re-evaluated:Patient Re-evaluated prior to induction Oxygen Delivery Method: Circle system utilized Preoxygenation: Pre-oxygenation with 100% oxygen Induction Type: IV induction Ventilation: Mask ventilation without difficulty Laryngoscope Size: Miller and 2 Grade View: Grade II Tube type: Oral Tube size: 8.5 mm Number of attempts: 2 Airway Equipment and Method: Stylet and Oral airway Placement Confirmation: ETT inserted through vocal cords under direct vision, positive ETCO2 and breath sounds checked- equal and bilateral Secured at: 23 cm Tube secured with: Tape Dental Injury: Teeth and Oropharynx as per pre-operative assessment  Difficulty Due To: Difficult Airway- due to anterior larynx

## 2021-07-10 NOTE — Discharge Instructions (Signed)
Flexible Bronchoscopy, Care After This sheet gives you information about how to care for yourself after your test. Your doctor may also give you more specific instructions. If you have problems or questions, contact your doctor. Follow these instructions at home: Eating and drinking Do not eat or drink anything (not even water) for 2 hours after your test, or until your numbing medicine (local anesthetic) wears off. When your numbness is gone and your cough and gag reflexes have come back, you may: Eat only soft foods. Slowly drink liquids. The day after the test, go back to your normal diet. Driving Do not drive for 24 hours if you were given a medicine to help you relax (sedative). Do not drive or use heavy machinery while taking prescription pain medicine. General instructions  Take over-the-counter and prescription medicines only as told by your doctor. Return to your normal activities as told. Ask what activities are safe for you. Do not use any products that have nicotine or tobacco in them. This includes cigarettes and e-cigarettes. If you need help quitting, ask your doctor. Keep all follow-up visits as told by your doctor. This is important. It is very important if you had a tissue sample (biopsy) taken. Get help right away if: You have shortness of breath that gets worse. You get light-headed. You feel like you are going to pass out (faint). You have chest pain. You cough up: More than a little blood. More blood than before. Summary Do not eat or drink anything (not even water) for 2 hours after your test, or until your numbing medicine wears off. Do not use cigarettes. Do not use e-cigarettes. Get help right away if you have chest pain.  This information is not intended to replace advice given to you by your health care provider. Make sure you discuss any questions you have with your health care provider. Document Released: 12/02/2008 Document Revised: 01/17/2017 Document  Reviewed: 02/23/2016 Elsevier Patient Education  2020 [employer].

## 2021-07-10 NOTE — Interval H&P Note (Signed)
History and Physical Interval Note:  07/10/2021 8:46 AM  Shannon [name]  has presented today for surgery, with the diagnosis of lung nodule.  The various methods of treatment have been discussed with the patient and family. After consideration of risks, benefits and other options for treatment, the patient has consented to  Procedure(s) with comments: ROBOTIC ASSISTED NAVIGATIONAL BRONCHOSCOPY (N/A) - ION w/ CIOS as a surgical intervention.  The patient's history has been reviewed, patient examined, no change in status, stable for surgery.  I have reviewed the patient's chart and labs.  Questions were answered to the patient's satisfaction.     Rachel Bo Mana Morison

## 2021-07-10 NOTE — Transfer of Care (Signed)
Immediate Anesthesia Transfer of Care Note  Patient: JEANNELLE WIENS  Procedure(s) Performed: ROBOTIC ASSISTED NAVIGATIONAL BRONCHOSCOPY BRONCHIAL NEEDLE ASPIRATION BIOPSIES BRONCHIAL BIOPSIES BRONCHIAL BRUSHINGS RADIAL ENDOBRONCHIAL ULTRASOUND BRONCHIAL WASHINGS  Patient Location: PACU  Anesthesia Type:General  Level of Consciousness: awake  Airway & Oxygen Therapy: Patient Spontanous Breathing and Patient connected to face mask oxygen  Post-op Assessment: Report given to RN and Post -op Vital signs reviewed and stable  Post vital signs: Reviewed and stable  Last Vitals:  Vitals Value Taken Time  BP 131/75 07/10/21 0950  Temp    Pulse 80 07/10/21 0953  Resp 16 07/10/21 0953  SpO2 98 % 07/10/21 0953  Vitals shown include unvalidated device data.  Last Pain:  Vitals:   07/10/21 0746  TempSrc:   PainSc: 0-No pain      Patients Stated Pain Goal: 2 (07/10/21 0746)  Complications: No notable events documented.

## 2021-07-10 NOTE — Op Note (Signed)
Video Bronchoscopy with Robotic Assisted Bronchoscopic Navigation   Date of Operation: 07/10/2021   Pre-op Diagnosis: Left upper lobe  Post-op Diagnosis: Left upper lobe  Surgeon: Garner Nash, DO   Assistants: None   Anesthesia: General endotracheal anesthesia  Operation: Flexible video fiberoptic bronchoscopy with robotic assistance and biopsies.  Estimated Blood Loss: Minimal  Complications: None  Indications and History: MIRENDA BALTAZAR is a 71 y.o. female with history of left upper lobe. The risks, benefits, complications, treatment options and expected outcomes were discussed with the patient.  The possibilities of pneumothorax, pneumonia, reaction to medication, pulmonary aspiration, perforation of a viscus, bleeding, failure to diagnose a condition and creating a complication requiring transfusion or operation were discussed with the patient who freely signed the consent.    Description of Procedure: The patient was seen in the Preoperative Area, was examined and was deemed appropriate to proceed.  The patient was taken to [facility] endoscopy room 3, identified as [name] and the procedure verified as Flexible Video Fiberoptic Bronchoscopy.  A Time Out was held and the above information confirmed.   Prior to the date of the procedure a high-resolution CT scan of the chest was performed. Utilizing ION software program a virtual tracheobronchial tree was generated to allow the creation of distinct navigation pathways to the patient's parenchymal abnormalities. After being taken to the operating room general anesthesia was initiated and the patient  was orally intubated. The video fiberoptic bronchoscope was introduced via the endotracheal tube and a general inspection was performed which showed normal right and left lung anatomy, aspiration of the bilateral mainstems was completed to remove any remaining secretions. Robotic catheter inserted into patient's endotracheal tube.    Target #1 left upper lobe: The distinct navigation pathways prepared prior to this procedure were then utilized to navigate to patient's lesion identified on CT scan. The robotic catheter was secured into place and the vision probe was withdrawn.  Lesion location was approximated using fluoroscopy, three-dimensional cone beam CT imaging, and radial endobronchial ultrasound for peripheral targeting. Under fluoroscopic guidance transbronchial needle brushings, transbronchial needle biopsies, and transbronchial forceps biopsies were performed to be sent for cytology and pathology. A bronchioalveolar lavage was performed in the left upper lobe and sent for microbiology.  At the end of the procedure a general airway inspection was performed and there was no evidence of active bleeding. The bronchoscope was removed.  The patient tolerated the procedure well. There was no significant blood loss and there were no obvious complications. A post-procedural chest x-ray is pending.  Samples Target #1: 1. Transbronchial needle brushings from left upper lobe 2. Transbronchial Wang needle biopsies from left upper lobe 3. Transbronchial forceps biopsies from left upper lobe 4. Bronchoalveolar lavage from left upper lobe  Plans:  The patient will be discharged from the PACU to home when recovered from anesthesia and after chest x-ray is reviewed. We will review the cytology, pathology and microbiology results with the patient when they become available. Outpatient followup will be with Garner Nash, Celina, DO [facility] 07/10/2021 10:00 AM

## 2021-07-10 NOTE — Anesthesia Postprocedure Evaluation (Signed)
Anesthesia Post Note  Patient: LAIYANA PANZERA  Procedure(s) Performed: ROBOTIC ASSISTED NAVIGATIONAL BRONCHOSCOPY BRONCHIAL NEEDLE ASPIRATION BIOPSIES BRONCHIAL BIOPSIES BRONCHIAL BRUSHINGS RADIAL ENDOBRONCHIAL ULTRASOUND BRONCHIAL WASHINGS     Patient location during evaluation: PACU Anesthesia Type: General Level of consciousness: awake and alert Pain management: pain level controlled Vital Signs Assessment: post-procedure vital signs reviewed and stable Respiratory status: spontaneous breathing, nonlabored ventilation and respiratory function stable Cardiovascular status: blood pressure returned to baseline and stable Postop Assessment: no apparent nausea or vomiting Anesthetic complications: no   No notable events documented.  Last Vitals:  Vitals:   07/10/21 1050 07/10/21 1105  BP: 123/89 125/73  Pulse: 79 78  Resp: 12 12  Temp: 36.6 C   SpO2: 97% 96%    Last Pain:  Vitals:   07/10/21 1050  TempSrc:   PainSc: 0-No pain                 Tressia Labrum,W. EDMOND

## 2021-07-11 ENCOUNTER — Encounter (HOSPITAL_COMMUNITY): Payer: Self-pay | Admitting: Pulmonary Disease

## 2021-07-11 LAB — CYTOLOGY - NON PAP

## 2021-07-13 ENCOUNTER — Telehealth: Payer: Self-pay | Admitting: Pulmonary Disease

## 2021-07-13 LAB — CULTURE, BAL-QUANTITATIVE W GRAM STAIN
Culture: NO GROWTH
Gram Stain: NONE SEEN

## 2021-07-13 LAB — ACID FAST SMEAR (AFB, MYCOBACTERIA): Acid Fast Smear: NEGATIVE

## 2021-07-13 NOTE — Telephone Encounter (Signed)
Called patient and she states that she is requesting the results from her Bronch procedure that was done this week. I advised patient that I would message Dr Tonia Brooms in regards to those results.   Dr Tonia Brooms please advise

## 2021-07-15 LAB — ANAEROBIC CULTURE W GRAM STAIN: Gram Stain: NONE SEEN

## 2021-07-17 NOTE — Telephone Encounter (Signed)
Triage,   I tried calling patient. No answer.  Please let patient know pathology results with no evidence of active cancer.  Her culture results are still pending and will be held in the lab for up to 6 weeks.   She has an appointment to see Dr. Francine Graven in follow-up already.   Josephine Igo, DO  [facility]  07/17/2021 1:36 PM    I called and spoke with the pt and notified of results per Dr Tonia Brooms. She verbalized understanding.

## 2021-07-17 NOTE — Telephone Encounter (Signed)
Patient is returning phone call. Patient phone number is [phone].

## 2021-07-17 NOTE — Telephone Encounter (Signed)
Called and left voicemail for patient to call office back in regards to bronch procedure results.

## 2021-07-23 LAB — FUNGUS CULTURE WITH STAIN

## 2021-07-23 LAB — FUNGAL ORGANISM REFLEX

## 2021-07-23 LAB — FUNGUS CULTURE RESULT

## 2021-08-08 ENCOUNTER — Ambulatory Visit (INDEPENDENT_AMBULATORY_CARE_PROVIDER_SITE_OTHER): Payer: Medicare HMO | Admitting: Pulmonary Disease

## 2021-08-08 ENCOUNTER — Encounter: Payer: Self-pay | Admitting: Pulmonary Disease

## 2021-08-08 VITALS — BP 132/80 | HR 83 | Ht 66.0 in | Wt 165.6 lb

## 2021-08-08 DIAGNOSIS — J4540 Moderate persistent asthma, uncomplicated: Secondary | ICD-10-CM

## 2021-08-08 DIAGNOSIS — J432 Centrilobular emphysema: Secondary | ICD-10-CM

## 2021-08-08 DIAGNOSIS — R911 Solitary pulmonary nodule: Secondary | ICD-10-CM | POA: Diagnosis not present

## 2021-08-08 LAB — PULMONARY FUNCTION TEST
DL/VA % pred: 66 %
DL/VA: 2.7 ml/min/mmHg/L
DLCO cor % pred: 55 %
DLCO cor: 11.55 ml/min/mmHg
DLCO unc % pred: 55 %
DLCO unc: 11.55 ml/min/mmHg
FEF 25-75 Post: 1.32 L/sec
FEF 25-75 Pre: 1.3 L/sec
FEF2575-%Change-Post: 1 %
FEF2575-%Pred-Post: 74 %
FEF2575-%Pred-Pre: 73 %
FEV1-%Change-Post: 1 %
FEV1-%Pred-Post: 99 %
FEV1-%Pred-Pre: 98 %
FEV1-Post: 1.95 L
FEV1-Pre: 1.93 L
FEV1FVC-%Change-Post: -1 %
FEV1FVC-%Pred-Pre: 93 %
FEV6-%Change-Post: 1 %
FEV6-%Pred-Post: 112 %
FEV6-%Pred-Pre: 110 %
FEV6-Post: 2.74 L
FEV6-Pre: 2.69 L
FEV6FVC-%Change-Post: 0 %
FEV6FVC-%Pred-Post: 103 %
FEV6FVC-%Pred-Pre: 103 %
FVC-%Change-Post: 2 %
FVC-%Pred-Post: 108 %
FVC-%Pred-Pre: 106 %
FVC-Post: 2.76 L
FVC-Pre: 2.7 L
Post FEV1/FVC ratio: 71 %
Post FEV6/FVC ratio: 99 %
Pre FEV1/FVC ratio: 72 %
Pre FEV6/FVC Ratio: 100 %
RV % pred: 92 %
RV: 2.16 L
TLC % pred: 93 %
TLC: 5.02 L

## 2021-08-08 NOTE — Progress Notes (Signed)
Synopsis: Referred in March 2023 for shortness of breath by Vanita Panda, MD  Subjective:   PATIENT ID: Shannon [name]: female DOB: [birth year], MRN: [id number]  HPI  Chief Complaint  Patient presents with   Follow-up    F/U after PFT   Shannon [name] is a 71 year old woman, former smoker with hypertension, GERD and asthma who returns to pulmonary clinic for emphysema.   She has done well since hospitaliztion in May. She had nav bronch performed on 5/23 for RUL nodule which was negative for malignancy. BAL grew aspergillus.   She continues on advair 500-18mg 1 puff twice daily.   PFTs today show moderate diffusion defect.   Initial OV 05/03/21 Patient reports using Advair 500-50 mcg 1 puff twice daily over the last 15 to 20 years without much issue until recently she has noticed increased wheezing in the evening at bedtime.  She uses albuterol inhaler with relief of her wheezing.  She denies nighttime awakenings from her sleep with cough wheezing or shortness of breath.  Cold air and hot humid days seem to bother her asthma symptoms along with strong cleaning agents.  She denies any issues with strong perfumes or colognes.  She also reports increased reflux over recent months and has complaints of food getting stuck in her chest similar to before when she required esophageal dilation by gastroenterology.  She has upcoming appointment with her GI team for follow-up.  She is eating 1 hour prior to bedtime.  Seasonal allergies can also affect her asthma symptoms especially spring season.  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  She smoked half a pack a day for 30 years.  She was exposed to secondhand smoke in childhood.  She currently lives with her son.  Past Medical History:  Diagnosis Date   Anxiety    Arthritis    "right knee" (05/26/2014)   Asthma    Chronic lower back pain    Dysrhythmia     irregular heart beat   GERD (gastroesophageal reflux disease)    HLD (hyperlipidemia)    Hypertension    Pneumonia    Stroke ([facility]    TIA - ?2021   TIA (transient ischemic attack) 04/2018     Family History  Problem Relation Age of Onset   Hypertension Mother    Hypertension Father    Colon cancer Father    Hypertension Sister      Social History   Socioeconomic History   Marital status: Widowed    Spouse name: Not on file   Number of children: Not on file   Years of education: Not on file   Highest education level: Not on file  Occupational History   Not on file  Tobacco Use   Smoking status: Former    Packs/day: 0.50    Years: 30.00    Total pack years: 15.00    Types: Cigarettes    Quit date: 11/10/1998    Years since quitting: 22.7   Smokeless tobacco: Never  Vaping Use   Vaping Use: Never used  Substance and Sexual Activity   Alcohol use: Yes    Comment: occasional   Drug use: No   Sexual activity: Not Currently  Other Topics Concern   Not on file  Social History Narrative   Not on file   Social Determinants of Health   Financial Resource Strain: Not on  file  Food Insecurity: Not on file  Transportation Needs: Not on file  Physical Activity: Not on file  Stress: Not on file  Social Connections: Not on file  Intimate Partner Violence: Not on file     Allergies  Allergen Reactions   Amoxicillin-Pot Clavulanate Itching     Outpatient Medications Prior to Visit  Medication Sig Dispense Refill   albuterol (PROVENTIL HFA;VENTOLIN HFA) 108 (90 BASE) MCG/ACT inhaler Inhale 2 puffs into the lungs every 2 (two) hours as needed for wheezing or shortness of breath (cough). 1 Inhaler 0   Aspirin-Acetaminophen-Caffeine (GOODY HEADACHE PO) Take 2 packets by mouth daily as needed (pain).     atorvastatin (LIPITOR) 80 MG tablet Take 1 tablet (80 mg total) by mouth daily at 6 PM. 30 tablet 2   busPIRone (BUSPAR) 10 MG tablet Take 10 mg by mouth 2 (two) times  daily.     Cholecalciferol (VITAMIN D) 50 MCG (2000 UT) tablet Take 2,000 Units by mouth daily.     clopidogrel (PLAVIX) 75 MG tablet Take 1 tablet (75 mg total) by mouth daily. 30 tablet 0   desloratadine (CLARINEX) 5 MG tablet Take 5 mg by mouth daily.     ezetimibe (ZETIA) 10 MG tablet Take 10 mg by mouth daily.     FLUoxetine (PROZAC) 20 MG capsule Take 20 mg by mouth daily.     fluticasone (FLONASE) 50 MCG/ACT nasal spray Place 1 spray into both nostrils daily.     Fluticasone-Salmeterol (ADVAIR) 500-50 MCG/DOSE AEPB Inhale 1 puff into the lungs 2 (two) times daily.     gabapentin (NEURONTIN) 600 MG tablet Take 600 mg by mouth 3 (three) times daily.     meloxicam (MOBIC) 7.5 MG tablet Take 7.5 mg by mouth daily.     montelukast (SINGULAIR) 10 MG tablet Take 1 tablet (10 mg total) by mouth at bedtime. 30 tablet 11   pantoprazole (PROTONIX) 40 MG tablet Take 1 tablet (40 mg total) by mouth daily. 30 tablet 6   valsartan (DIOVAN) 80 MG tablet Take 80 mg by mouth daily.     cefdinir (OMNICEF) 300 MG capsule Take 1 capsule (300 mg total) by mouth 2 (two) times daily. (Patient not taking: Reported on 07/09/2021) 6 capsule 0   No facility-administered medications prior to visit.   Review of Systems  Constitutional:  Negative for chills, fever, malaise/fatigue and weight loss.  HENT:  Negative for congestion and sinus pain.   Eyes: Negative.   Respiratory:  Negative for cough, hemoptysis, sputum production, shortness of breath and wheezing.   Cardiovascular:  Negative for chest pain, palpitations, orthopnea, claudication and leg swelling.  Gastrointestinal:  Negative for abdominal pain, heartburn, nausea and vomiting.  Genitourinary: Negative.   Musculoskeletal:  Negative for joint pain and myalgias.  Skin:  Negative for rash.  Neurological:  Negative for weakness.  Endo/Heme/Allergies: Negative.   Psychiatric/Behavioral:  The patient is not nervous/anxious.     Objective:   Vitals:    08/08/21 1412  BP: 132/80  Pulse: 83  SpO2: 99%  Weight: 165 lb 9.6 oz (75.1 kg)  Height: _0  (1.676 m)    Physical Exam Constitutional:      General: She is not in acute distress.    Appearance: She is not ill-appearing.  HENT:     Head: Normocephalic and atraumatic.  Eyes:     General: No scleral icterus.    Conjunctiva/sclera: Conjunctivae normal.  Cardiovascular:     Rate and Rhythm: Normal  rate and regular rhythm.     Pulses: Normal pulses.     Heart sounds: Normal heart sounds. No murmur heard. Pulmonary:     Effort: Pulmonary effort is normal.     Breath sounds: Normal breath sounds. No wheezing, rhonchi or rales.  Musculoskeletal:     Cervical back: No rigidity.     Right lower leg: No edema.     Left lower leg: No edema.  Skin:    General: Skin is warm and dry.  Neurological:     General: No focal deficit present.     Mental Status: She is alert.  Psychiatric:        Mood and Affect: Mood normal.        Behavior: Behavior normal.        Thought Content: Thought content normal.        Judgment: Judgment normal.    CBC    Component Value Date/Time   WBC 7.7 06/20/2021 0511   RBC 3.36 (L) 06/20/2021 0511   HGB 10.4 (L) 06/20/2021 0511   HCT 32.2 (L) 06/20/2021 0511   PLT 207 06/20/2021 0511   MCV 95.8 06/20/2021 0511   MCH 31.0 06/20/2021 0511   MCHC 32.3 06/20/2021 0511   RDW 15.4 06/20/2021 0511   LYMPHSABS 1.1 06/19/2021 2142   MONOABS 1.1 (H) 06/19/2021 2142   EOSABS 0.0 06/19/2021 2142   BASOSABS 0.0 06/19/2021 2142      Latest Ref Rng & Units 06/21/2021    4:55 AM 06/20/2021    5:11 AM 06/19/2021    9:42 PM  BMP  Glucose 70 - 99 mg/dL 187  232  145   BUN 8 - 23 mg/dL _0 Creatinine 0.44 - 1.00 mg/dL 1.04  1.40  1.30   Sodium 135 - 145 mmol/L 137  136  137   Potassium 3.5 - 5.1 mmol/L 4.3  3.4  2.9   Chloride 98 - 111 mmol/L 108  108  105   CO2 22 - 32 mmol/L _1 Calcium 8.9 - 10.3 mg/dL 8.6  7.9  8.6    Chest  imaging: CT Chest 06/20/21 Spiculated lesion in the left upper lobe with associated small nodule suspicious for neoplasm till proven otherwise. Multidisciplinary workup is recommended.   Focal infiltrate in the medial aspect of the right lower lobe.   Aortic Atherosclerosis (ICD10-I70.0) and Emphysema  CT Coronary Scan 04/21/20 1.  No acute findings in the imaged extracardiac chest. 2.  Aortic Atherosclerosis (ICD10-I70.0). 3. Right middle lobe 4 mm pulmonary nodule. No follow-up needed if patient is low-risk. Non-contrast chest CT can be considered in 12 months if patient is high-risk. This recommendation follows the consensus statement: Guidelines for Management of Incidental Pulmonary Nodules Detected on CT Images: From the Fleischner Society 2017; Radiology 2017; 284:228-243. 4.  Tiny hiatal hernia.  PFT:    Latest Ref Rng & Units 08/08/2021    1:00 PM  PFT Results  FVC-Pre L 2.70  P  FVC-Predicted Pre % 106  P  FVC-Post L 2.76  P  FVC-Predicted Post % 108  P  Pre FEV1/FVC % % 72  P  Post FEV1/FCV % % 71  P  FEV1-Pre L 1.93  P  FEV1-Predicted Pre % 98  P  FEV1-Post L 1.95  P  DLCO uncorrected ml/min/mmHg 11.55  P  DLCO UNC% % 55  P  DLCO corrected ml/min/mmHg 11.55  P  DLCO  COR %Predicted % 55  P  DLVA Predicted % 66  P  TLC L 5.02  P  TLC % Predicted % 93  P  RV % Predicted % 92  P    P Preliminary result    Labs: BAL culture 07/10/21 Aspergillus fumigatus Aspergillus flavus  Path:  Echo 05/04/18: LV EF 50-55%. LV mildly dilated. RV size and systolic function are normal.   Heart Catheterization:  Assessment & Plan:   Pulmonary nodule  Centrilobular emphysema (HCC)  Discussion: Shannon [name] is a 71 year old woman, former smoker with hypertension, GERD and asthma who returns to pulmonary clinic for emphysema and lung nodule.   She is to continue Advair 500-50 mcg 1 puff twice daily and albuterol as needed for her COPD.  She is to continue  montelukast 10 mg daily.  We will repeat CT Chest scan in November to monitor the LUL lung nodule.  She is to continue pantoprazole to 40 mg daily for GERD.   Follow up in November after CT scan.   Freda Jackson, MD [facility] Office: [phone]    Current Outpatient Medications:    albuterol (PROVENTIL HFA;VENTOLIN HFA) 108 (90 BASE) MCG/ACT inhaler, Inhale 2 puffs into the lungs every 2 (two) hours as needed for wheezing or shortness of breath (cough)., Disp: 1 Inhaler, Rfl: 0   Aspirin-Acetaminophen-Caffeine (GOODY HEADACHE PO), Take 2 packets by mouth daily as needed (pain)., Disp: , Rfl:    atorvastatin (LIPITOR) 80 MG tablet, Take 1 tablet (80 mg total) by mouth daily at 6 PM., Disp: 30 tablet, Rfl: 2   busPIRone (BUSPAR) 10 MG tablet, Take 10 mg by mouth 2 (two) times daily., Disp: , Rfl:    Cholecalciferol (VITAMIN D) 50 MCG (2000 UT) tablet, Take 2,000 Units by mouth daily., Disp: , Rfl:    clopidogrel (PLAVIX) 75 MG tablet, Take 1 tablet (75 mg total) by mouth daily., Disp: 30 tablet, Rfl: 0   desloratadine (CLARINEX) 5 MG tablet, Take 5 mg by mouth daily., Disp: , Rfl:    ezetimibe (ZETIA) 10 MG tablet, Take 10 mg by mouth daily., Disp: , Rfl:    FLUoxetine (PROZAC) 20 MG capsule, Take 20 mg by mouth daily., Disp: , Rfl:    fluticasone (FLONASE) 50 MCG/ACT nasal spray, Place 1 spray into both nostrils daily., Disp: , Rfl:    Fluticasone-Salmeterol (ADVAIR) 500-50 MCG/DOSE AEPB, Inhale 1 puff into the lungs 2 (two) times daily., Disp: , Rfl:    gabapentin (NEURONTIN) 600 MG tablet, Take 600 mg by mouth 3 (three) times daily., Disp: , Rfl:    meloxicam (MOBIC) 7.5 MG tablet, Take 7.5 mg by mouth daily., Disp: , Rfl:    montelukast (SINGULAIR) 10 MG tablet, Take 1 tablet (10 mg total) by mouth at bedtime., Disp: 30 tablet, Rfl: 11   pantoprazole (PROTONIX) 40 MG tablet, Take 1 tablet (40 mg total) by mouth daily., Disp: 30 tablet, Rfl: 6   valsartan  (DIOVAN) 80 MG tablet, Take 80 mg by mouth daily., Disp: , Rfl:

## 2021-08-08 NOTE — Patient Instructions (Signed)
We will repeat a CT Chest scan in November  Continue advair 1 puff twice daily - Rinse mouth out after each use  Your breathing tests show moderate diffusion defect today, where oxygen is not traveling through the lungs as easy due to the changes in the lung from emphysema.  Follow up visit after the CT Chest scan to review it together

## 2021-08-08 NOTE — Progress Notes (Signed)
PFT done today. 

## 2021-08-22 LAB — ACID FAST CULTURE WITH REFLEXED SENSITIVITIES (MYCOBACTERIA): Acid Fast Culture: NEGATIVE

## 2021-09-11 ENCOUNTER — Telehealth: Payer: Self-pay | Admitting: Pulmonary Disease

## 2021-09-11 DIAGNOSIS — J988 Other specified respiratory disorders: Secondary | ICD-10-CM

## 2021-09-11 MED ORDER — DOXYCYCLINE HYCLATE 100 MG PO TABS: 100.0000 mg | ORAL_TABLET | Freq: Two times a day (BID) | ORAL | 0 refills | Status: DC

## 2021-09-11 NOTE — Telephone Encounter (Signed)
I left a message for the patient to call back with recommendations form the provider.

## 2021-09-11 NOTE — Telephone Encounter (Signed)
I recommend that she have Covid testing done. If her covid test is negative I have sent in doxycycline twice daily for 7 days. If her covid test is postive she can call us back and we can give her instructions on covid related therapies at that time.  Thanks JD

## 2021-09-11 NOTE — Telephone Encounter (Signed)
Noted  

## 2021-09-11 NOTE — Telephone Encounter (Signed)
She is having some wheezing, shortness of breath, nausea and was unable to throw up. She has been feeling bad since Sunday. She has had some body aches, chills and fever of 100.4 as of this morning. She has been taking her Adviar but had not took anything else. She has not had a covid test at this time. Please advise.

## 2021-09-14 ENCOUNTER — Other Ambulatory Visit: Payer: Self-pay | Admitting: Family

## 2021-09-14 DIAGNOSIS — Z1231 Encounter for screening mammogram for malignant neoplasm of breast: Secondary | ICD-10-CM

## 2021-09-27 ENCOUNTER — Other Ambulatory Visit: Payer: Self-pay | Admitting: Pulmonary Disease

## 2021-09-27 DIAGNOSIS — K219 Gastro-esophageal reflux disease without esophagitis: Secondary | ICD-10-CM

## 2021-12-31 ENCOUNTER — Ambulatory Visit
Admission: RE | Admit: 2021-12-31 | Discharge: 2021-12-31 | Disposition: A | Discharge status: Home / Self Care | Payer: Medicare HMO | Source: Ambulatory Visit | Attending: Pulmonary Disease | Admitting: Pulmonary Disease

## 2021-12-31 DIAGNOSIS — R911 Solitary pulmonary nodule: Secondary | ICD-10-CM

## 2022-01-01 ENCOUNTER — Telehealth: Payer: Self-pay | Admitting: Pulmonary Disease

## 2022-01-01 DIAGNOSIS — B449 Aspergillosis, unspecified: Secondary | ICD-10-CM

## 2022-01-01 NOTE — Telephone Encounter (Signed)
Spoke with Elnita Maxwell from [facility]. She was calling in regards to the CT scan the patient had yesterday. Below is a copy of the impression:   IMPRESSION: 1. New segmental consolidation with air bronchograms in the RIGHT lower lobe is concerning for pneumonia. Recommend clinical correlation for pulmonary infection. 2. New RIGHT paratracheal adenopathy with differential including reactive adenopathy related to RIGHT lower lobe pneumonia versus metastatic adenopathy. The nodes are larger than typically present with reactive adenopathy. Patient may benefit from an FDG PET scan if no signs of current pulmonary infection. Regardless patient warts follow-up CT scan to demonstrate resolution pneumonia and adenopathy. 3. Resolution of the LEFT upper lobe nodule of concern. 4. Mild atelectasis and effusion in the posterior RIGHT upper lobe.  She is currently scheduled for a follow up on 01/07/22 with Dr. Francine Graven.   Dr. Francine Graven, can you please advise? Thanks!

## 2022-01-02 NOTE — Telephone Encounter (Signed)
Attempted to call pt but unable to reach. Left her a message stating to her that we are waiting to hear from JD about the results and recs and stated to her that we would call her back once hearing from him.

## 2022-01-04 NOTE — Telephone Encounter (Signed)
Please let patient know we will review her scan in more detail at the appointment on Monday. The left upper lobe area we biopsied before has cleared up but she has new findings of infiltrates in the right lung and fluid around the right lung.   Thanks, JD

## 2022-01-04 NOTE — Telephone Encounter (Signed)
Attempted to call pt but unable to reach. Left message for her to return call. Due to pt having multiple attempts trying to reach her without having any success, will close encounter. Pt does have an upcoming appt with JD 11/20.

## 2022-01-07 ENCOUNTER — Encounter: Payer: Self-pay | Admitting: Pulmonary Disease

## 2022-01-07 ENCOUNTER — Ambulatory Visit (INDEPENDENT_AMBULATORY_CARE_PROVIDER_SITE_OTHER): Payer: Medicare HMO | Admitting: Pulmonary Disease

## 2022-01-07 VITALS — BP 128/86 | HR 102 | Ht 66.0 in | Wt 153.8 lb

## 2022-01-07 DIAGNOSIS — J4540 Moderate persistent asthma, uncomplicated: Secondary | ICD-10-CM | POA: Diagnosis not present

## 2022-01-07 DIAGNOSIS — J181 Lobar pneumonia, unspecified organism: Secondary | ICD-10-CM

## 2022-01-07 DIAGNOSIS — J432 Centrilobular emphysema: Secondary | ICD-10-CM | POA: Diagnosis not present

## 2022-01-07 LAB — CBC WITH DIFFERENTIAL/PLATELET
Basophils Absolute: 0 10*3/uL (ref 0.0–0.1)
Basophils Relative: 0.5 % (ref 0.0–3.0)
Eosinophils Absolute: 0.2 10*3/uL (ref 0.0–0.7)
Eosinophils Relative: 3.9 % (ref 0.0–5.0)
HCT: 37 % (ref 36.0–46.0)
Hemoglobin: 12.3 g/dL (ref 12.0–15.0)
Lymphocytes Relative: 21.6 % (ref 12.0–46.0)
Lymphs Abs: 1.2 10*3/uL (ref 0.7–4.0)
MCHC: 33.3 g/dL (ref 30.0–36.0)
MCV: 89.7 fl (ref 78.0–100.0)
Monocytes Absolute: 0.5 10*3/uL (ref 0.1–1.0)
Monocytes Relative: 9.3 % (ref 3.0–12.0)
Neutro Abs: 3.5 10*3/uL (ref 1.4–7.7)
Neutrophils Relative %: 64.7 % (ref 43.0–77.0)
Platelets: 233 10*3/uL (ref 150.0–400.0)
RBC: 4.12 Mil/uL (ref 3.87–5.11)
RDW: 20.1 % — ABNORMAL HIGH (ref 11.5–15.5)
WBC: 5.4 10*3/uL (ref 4.0–10.5)

## 2022-01-07 LAB — COMPREHENSIVE METABOLIC PANEL
ALT: 21 U/L (ref 0–35)
AST: 34 U/L (ref 0–37)
Albumin: 4.1 g/dL (ref 3.5–5.2)
Alkaline Phosphatase: 88 U/L (ref 39–117)
BUN: 11 mg/dL (ref 6–23)
CO2: 21 mEq/L (ref 19–32)
Calcium: 9.2 mg/dL (ref 8.4–10.5)
Chloride: 108 mEq/L (ref 96–112)
Creatinine, Ser: 1.01 mg/dL (ref 0.40–1.20)
GFR: 55.56 mL/min — ABNORMAL LOW (ref >60.00)
Glucose, Bld: 111 mg/dL — ABNORMAL HIGH (ref 70–99)
Potassium: 3.9 mEq/L (ref 3.5–5.1)
Sodium: 142 mEq/L (ref 135–145)
Total Bilirubin: 1.7 mg/dL — ABNORMAL HIGH (ref 0.2–1.2)
Total Protein: 7.7 g/dL (ref 6.0–8.3)

## 2022-01-07 MED ORDER — CEFDINIR 300 MG PO CAPS: 300.0000 mg | ORAL_CAPSULE | Freq: Two times a day (BID) | ORAL | 0 refills | Status: DC

## 2022-01-07 MED ORDER — METRONIDAZOLE 500 MG PO TABS: 500.0000 mg | ORAL_TABLET | Freq: Three times a day (TID) | ORAL | 0 refills | Status: DC

## 2022-01-07 MED ORDER — ALBUTEROL SULFATE (2.5 MG/3ML) 0.083% IN NEBU: 2.5000 mg | INHALATION_SOLUTION | Freq: Four times a day (QID) | RESPIRATORY_TRACT | 12 refills | Status: AC | PRN

## 2022-01-07 NOTE — Progress Notes (Signed)
Synopsis: Referred in March 2023 for shortness of breath by Vanita Panda, MD  Subjective:   PATIENT ID: Shannon [name] GENDER: female DOB: 1950-10-21, MRN: [id number]  HPI  Chief Complaint  Patient presents with   Follow-up    F/U after CT. States she has been more SOB. Also has a productive cough with green phlegm. Did see a small amount of blood in her phlegm last week.    Shannon [name] is a 71 year old woman, former smoker with hypertension, GERD and asthma who returns to pulmonary clinic for emphysema.   She reports about an 8lb weight loss since last visit with productive cough and some night sweats. Repeat CT Chest scan shows resolution of the LUL infiltrate that was biopsy positive for aspergillus but new right sided findings with segmental consolidation with air bronchograms in right lower lobe, new right paratracheal adenopathy and mild atelectasis and effusion in the posterior right upper lobe.   She does report dysphagia and has an appointment to see GI later this month regarding possible need for EGD and dilation which she has required in the past.   OV 08/08/21 She has done well since hospitaliztion in May. She had nav bronch performed on 5/23 for RUL nodule which was negative for malignancy. BAL grew aspergillus.   She continues on advair 500-34mg 1 puff twice daily.   PFTs today show moderate diffusion defect.   Initial OV 05/03/21 Patient reports using Advair 500-50 mcg 1 puff twice daily over the last 15 to 20 years without much issue until recently she has noticed increased wheezing in the evening at bedtime.  She uses albuterol inhaler with relief of her wheezing.  She denies nighttime awakenings from her sleep with cough wheezing or shortness of breath.  Cold air and hot humid days seem to bother her asthma symptoms along with strong cleaning agents.  She denies any issues with strong perfumes or colognes.  She also reports increased reflux over recent  months and has complaints of food getting stuck in her chest similar to before when she required esophageal dilation by gastroenterology.  She has upcoming appointment with her GI team for follow-up.  She is eating 1 hour prior to bedtime.  Seasonal allergies can also affect her asthma symptoms especially spring season.  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  She smoked half a pack a day for 30 years.  She was exposed to secondhand smoke in childhood.  She currently lives with her son.  Past Medical History:  Diagnosis Date   Anxiety    Arthritis    "right knee" (05/26/2014)   Asthma    Chronic lower back pain    Dysrhythmia    irregular heart beat   GERD (gastroesophageal reflux disease)    HLD (hyperlipidemia)    Hypertension    Pneumonia    Stroke ([facility]    TIA - ?2021   TIA (transient ischemic attack) 04/2018     Family History  Problem Relation Age of Onset   Hypertension Mother    Hypertension Father    Colon cancer Father    Hypertension Sister      Social History   Socioeconomic History   Marital status: Widowed    Spouse name: Not on file   Number of children: Not on file   Years of education: Not on file   Highest education level: Not  on file  Occupational History   Not on file  Tobacco Use   Smoking status: Former    Packs/day: 0.50    Years: 30.00    Total pack years: 15.00    Types: Cigarettes    Quit date: 11/10/1998    Years since quitting: 23.1   Smokeless tobacco: Never  Vaping Use   Vaping Use: Never used  Substance and Sexual Activity   Alcohol use: Yes    Comment: occasional   Drug use: No   Sexual activity: Not Currently  Other Topics Concern   Not on file  Social History Narrative   Not on file   Social Determinants of Health   Financial Resource Strain: Not on file  Food Insecurity: Not on file  Transportation Needs: Not on file  Physical Activity: Not on file   Stress: Not on file  Social Connections: Not on file  Intimate Partner Violence: Not on file     Allergies  Allergen Reactions   Amoxicillin-Pot Clavulanate Itching   Other Other (See Comments)    08/06/2018 -    06/05/2016 -    09/15/2014 -    06/16/2014 -     Outpatient Medications Prior to Visit  Medication Sig Dispense Refill   albuterol (PROVENTIL HFA;VENTOLIN HFA) 108 (90 BASE) MCG/ACT inhaler Inhale 2 puffs into the lungs every 2 (two) hours as needed for wheezing or shortness of breath (cough). 1 Inhaler 0   Aspirin-Acetaminophen-Caffeine (GOODY HEADACHE PO) Take 2 packets by mouth daily as needed (pain).     atorvastatin (LIPITOR) 80 MG tablet Take 1 tablet (80 mg total) by mouth daily at 6 PM. 30 tablet 2   busPIRone (BUSPAR) 10 MG tablet Take 10 mg by mouth 2 (two) times daily.     Cholecalciferol (VITAMIN D) 50 MCG (2000 UT) tablet Take 2,000 Units by mouth daily.     clopidogrel (PLAVIX) 75 MG tablet Take 1 tablet (75 mg total) by mouth daily. 30 tablet 0   desloratadine (CLARINEX) 5 MG tablet Take 5 mg by mouth daily.     ezetimibe (ZETIA) 10 MG tablet Take 10 mg by mouth daily.     FLUoxetine (PROZAC) 20 MG capsule Take 20 mg by mouth daily.     fluticasone (FLONASE) 50 MCG/ACT nasal spray Place 1 spray into both nostrils daily.     Fluticasone-Salmeterol (ADVAIR) 500-50 MCG/DOSE AEPB Inhale 1 puff into the lungs 2 (two) times daily.     gabapentin (NEURONTIN) 600 MG tablet Take 600 mg by mouth 3 (three) times daily.     meloxicam (MOBIC) 7.5 MG tablet Take 7.5 mg by mouth daily.     montelukast (SINGULAIR) 10 MG tablet Take 1 tablet (10 mg total) by mouth at bedtime. 30 tablet 11   pantoprazole (PROTONIX) 40 MG tablet TAKE 1 TABLET BY MOUTH EVERY DAY 90 tablet 1   valsartan (DIOVAN) 80 MG tablet Take 80 mg by mouth daily.     doxycycline (VIBRA-TABS) 100 MG tablet Take 1 tablet (100 mg total) by mouth 2 (two) times daily. 14 tablet 0   No facility-administered  medications prior to visit.   Review of Systems  Constitutional:  Positive for weight loss. Negative for chills, fever and malaise/fatigue.  HENT:  Negative for congestion and sinus pain.   Eyes: Negative.   Respiratory:  Positive for cough and sputum production. Negative for hemoptysis, shortness of breath and wheezing.   Cardiovascular:  Negative for chest pain, palpitations, orthopnea, claudication and  leg swelling.  Gastrointestinal:  Negative for abdominal pain, heartburn, nausea and vomiting.  Genitourinary: Negative.   Musculoskeletal:  Negative for joint pain and myalgias.  Skin:  Negative for rash.  Neurological:  Negative for weakness.  Endo/Heme/Allergies: Negative.   Psychiatric/Behavioral:  The patient is not nervous/anxious.     Objective:   Vitals:   01/07/22 1123  BP: 128/86  Pulse: (!) 102  SpO2: 98%  Weight: 153 lb 12.8 oz (69.8 kg)  Height: _0  (1.676 m)    Physical Exam Constitutional:      General: She is not in acute distress.    Appearance: She is not ill-appearing.  HENT:     Head: Normocephalic and atraumatic.  Eyes:     General: No scleral icterus.    Conjunctiva/sclera: Conjunctivae normal.  Cardiovascular:     Rate and Rhythm: Normal rate and regular rhythm.     Pulses: Normal pulses.     Heart sounds: Normal heart sounds. No murmur heard. Pulmonary:     Effort: Pulmonary effort is normal.     Breath sounds: Normal breath sounds. No wheezing, rhonchi or rales.  Musculoskeletal:     Right lower leg: No edema.     Left lower leg: No edema.  Lymphadenopathy:     Cervical: No cervical adenopathy.  Skin:    General: Skin is warm and dry.  Neurological:     General: No focal deficit present.     Mental Status: She is alert.  Psychiatric:        Mood and Affect: Mood normal.        Behavior: Behavior normal.        Thought Content: Thought content normal.        Judgment: Judgment normal.    CBC    Component Value Date/Time   WBC  5.4 01/07/2022 1200   RBC 4.12 01/07/2022 1200   HGB 12.3 01/07/2022 1200   HCT 37.0 01/07/2022 1200   PLT 233.0 01/07/2022 1200   MCV 89.7 01/07/2022 1200   MCH 31.0 06/20/2021 0511   MCHC 33.3 01/07/2022 1200   RDW 20.1 (H) 01/07/2022 1200   LYMPHSABS 1.2 01/07/2022 1200   MONOABS 0.5 01/07/2022 1200   EOSABS 0.2 01/07/2022 1200   BASOSABS 0.0 01/07/2022 1200      Latest Ref Rng & Units 01/07/2022   12:00 PM 06/21/2021    4:55 AM 06/20/2021    5:11 AM  BMP  Glucose 70 - 99 mg/dL 111  187  232   BUN 6 - 23 mg/dL _1 Creatinine 0.40 - 1.20 mg/dL 1.01  1.04  1.40   Sodium 135 - 145 mEq/L 142  137  136   Potassium 3.5 - 5.1 mEq/L 3.9  4.3  3.4   Chloride 96 - 112 mEq/L 108  108  108   CO2 19 - 32 mEq/L _2 Calcium 8.4 - 10.5 mg/dL 9.2  8.6  7.9    Chest imaging: CT Chest 12/31/21 1. New segmental consolidation with air bronchograms in the RIGHT lower lobe is concerning for pneumonia. Recommend clinical correlation for pulmonary infection. 2. New RIGHT paratracheal adenopathy with differential including reactive adenopathy related to RIGHT lower lobe pneumonia versus metastatic adenopathy. The nodes are larger than typically present with reactive adenopathy. Patient may benefit from an FDG PET scan if no signs of current pulmonary infection. Regardless patient warts follow-up CT scan to demonstrate resolution pneumonia  and adenopathy. 3. Resolution of the LEFT upper lobe nodule of concern. 4. Mild atelectasis and effusion in the posterior RIGHT upper lobe.  CT Chest 06/20/21 Spiculated lesion in the left upper lobe with associated small nodule suspicious for neoplasm till proven otherwise. Multidisciplinary workup is recommended.   Focal infiltrate in the medial aspect of the right lower lobe.   Aortic Atherosclerosis (ICD10-I70.0) and Emphysema  CT Coronary Scan 04/21/20 1.  No acute findings in the imaged extracardiac chest. 2.  Aortic Atherosclerosis  (ICD10-I70.0). 3. Right middle lobe 4 mm pulmonary nodule. No follow-up needed if patient is low-risk. Non-contrast chest CT can be considered in 12 months if patient is high-risk. This recommendation follows the consensus statement: Guidelines for Management of Incidental Pulmonary Nodules Detected on CT Images: From the Fleischner Society 2017; Radiology 2017; 284:228-243. 4.  Tiny hiatal hernia.  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 LV mildly dilated. RV size and systolic function are normal.   Heart Catheterization:  Assessment & Plan:   Centrilobular emphysema () - Plan: Ambulatory Referral for DME  Moderate persistent asthma without complication - Plan: albuterol (PROVENTIL) (2.5 MG/3ML) 0.083% nebulizer solution, Ambulatory Referral for DME  Lobar pneumonia, unspecified organism ([place]) - Plan: cefdinir (OMNICEF) 300 MG capsule, metroNIDAZOLE (FLAGYL) 500 MG tablet, Respiratory or Resp and Sputum Culture, AFB Culture & Smear, Fungus Culture & Smear, Comp Met (CMET), CBC with Differential, Aspergillus fumigatus IgG, Aspergillus fumigatus IgG, CBC with Differential, Comp Met (CMET)  Discussion: Shannon [name] is a 71 year old woman, former smoker with hypertension, GERD and asthma who returns to pulmonary clinic for emphysema and lung nodule.   She reports issues with dysphagia and has new right lower lobe air bronchograms with right  sided paratracheal adenopathy, RUL atelectasis and small effusion.   Differential includes aspiration pneumonia vs aspergillus pneumonia vs malignancy.  We will start her on cefdinir and flagyl for aspiration pneumonia treatment for 2 weeks.  We will check labs today, including CBC, CMP and Aspergillus IgG levels.   We will check sputum cultures.  If she is not improved with antibiotic therapy we will take her for repeat bronchoscopy for BALs, EBUS and possible transbronchial biopsies.  Follow up in 2 weeks.  Freda Jackson, MD [facility] Office: [phone]    Current Outpatient Medications:    albuterol (PROVENTIL HFA;VENTOLIN HFA) 108 (90 BASE) MCG/ACT inhaler, Inhale 2 puffs into the lungs every 2 (two) hours as needed for wheezing or shortness of breath (cough)., Disp: 1 Inhaler, Rfl: 0   albuterol (PROVENTIL) (2.5 MG/3ML) 0.083% nebulizer solution, Take 3 mLs (2.5 mg total) by nebulization every 6 (six) hours as needed for wheezing or shortness of breath., Disp: 75 mL, Rfl:  12   Aspirin-Acetaminophen-Caffeine (GOODY HEADACHE PO), Take 2 packets by mouth daily as needed (pain)., Disp: , Rfl:    atorvastatin (LIPITOR) 80 MG tablet, Take 1 tablet (80 mg total) by mouth daily at 6 PM., Disp: 30 tablet, Rfl: 2   busPIRone (BUSPAR) 10 MG tablet, Take 10 mg by mouth 2 (two) times daily., Disp: , Rfl:    cefdinir (OMNICEF) 300 MG capsule, Take 1 capsule (300 mg total) by mouth 2 (two) times daily., Disp: 28 capsule, Rfl: 0   Cholecalciferol (VITAMIN D) 50 MCG (2000 UT) tablet, Take 2,000 Units by mouth daily., Disp: , Rfl:    clopidogrel (PLAVIX) 75 MG tablet, Take 1 tablet (75 mg total) by mouth daily., Disp: 30 tablet, Rfl: 0   desloratadine (CLARINEX) 5 MG tablet, Take 5 mg by mouth daily., Disp: , Rfl:    ezetimibe (ZETIA) 10 MG tablet, Take 10 mg by mouth daily., Disp: , Rfl:    FLUoxetine (PROZAC) 20 MG capsule, Take 20 mg by mouth daily., Disp: , Rfl:     fluticasone (FLONASE) 50 MCG/ACT nasal spray, Place 1 spray into both nostrils daily., Disp: , Rfl:    Fluticasone-Salmeterol (ADVAIR) 500-50 MCG/DOSE AEPB, Inhale 1 puff into the lungs 2 (two) times daily., Disp: , Rfl:    gabapentin (NEURONTIN) 600 MG tablet, Take 600 mg by mouth 3 (three) times daily., Disp: , Rfl:    meloxicam (MOBIC) 7.5 MG tablet, Take 7.5 mg by mouth daily., Disp: , Rfl:    metroNIDAZOLE (FLAGYL) 500 MG tablet, Take 1 tablet (500 mg total) by mouth 3 (three) times daily., Disp: 42 tablet, Rfl: 0   montelukast (SINGULAIR) 10 MG tablet, Take 1 tablet (10 mg total) by mouth at bedtime., Disp: 30 tablet, Rfl: 11   pantoprazole (PROTONIX) 40 MG tablet, TAKE 1 TABLET BY MOUTH EVERY DAY, Disp: 90 tablet, Rfl: 1   valsartan (DIOVAN) 80 MG tablet, Take 80 mg by mouth daily., Disp: , Rfl:

## 2022-01-07 NOTE — Patient Instructions (Addendum)
Start cefdinir twice daily for 2 weeks  Start metronidazole 3 times daily for 2 weeks  We are treating aspiration pneumonia due to concern of your swallowing troubles  We will check sputum cultures   We will order you a nebulizer machine and supplies  Start nebulizer treatments twice daily followed by flutter valve  We will check labs today  Follow up in 2 weeks

## 2022-01-09 ENCOUNTER — Other Ambulatory Visit: Payer: Medicare HMO

## 2022-01-09 DIAGNOSIS — J181 Lobar pneumonia, unspecified organism: Secondary | ICD-10-CM

## 2022-01-09 LAB — ASPERGILLUS FUMIGATUS IGG: Aspergillus fumigatus IgG: 6.2 mg/L (ref 0.0–66.5)

## 2022-01-15 ENCOUNTER — Telehealth: Payer: Self-pay

## 2022-01-15 NOTE — Telephone Encounter (Signed)
..     Pre-operative Risk Assessment    Patient Name: Shannon [name]  DOB: Jan 19, 1951 MRN: [id number]      Request for Surgical Clearance    Procedure:   EGD AND COLONOSCOPY  Date of Surgery:  Clearance 02/05/22                                 Surgeon:  DR Elnoria Howard Surgeon's Group or Practice Name:  [facility] Phone number:  [phone] Fax number:  [phone]   Type of Clearance Requested:   - Medical  - Pharmacy:  Hold Clopidogrel (Plavix)     Type of Anesthesia:   PROPOFOL   Additional requests/questions:    Jola Babinski   01/15/2022, 10:49 AM

## 2022-01-16 NOTE — Telephone Encounter (Signed)
Left detailed VM per DPR requesting patient to callback to schedule OV.   Alver Sorrow, NP

## 2022-01-16 NOTE — Telephone Encounter (Signed)
   Name: Shannon [name]  DOB: 01/24/1951  MRN: [id number]  Primary Cardiologist: None  Chart reviewed as part of pre-operative protocol coverage. Because of Necole A Vice's past medical history and time since last visit, she will require a follow-up in-office visit in order to better assess preoperative cardiovascular risk.Last seen 03/2020 recommended for f/u in 1 year.  Pre-op covering staff: - Please schedule appointment and call patient to inform them. If patient already had an upcoming appointment within acceptable timeframe, please add "pre-op clearance" to the appointment notes so provider is aware. - Please contact requesting surgeon's office via preferred method (i.e, phone, fax) to inform them of need for appointment prior to surgery.  Additional request to hold Plavix. She has known CAD by CT 04/21/20 but no prior intervention. She is on Plavix per Dr. Pearlean Brownie for hx of TIA. Clearance to hold Plavix would need to be addressed by PCP or neurology. Will route to requesting party so they are aware.   Alver Sorrow, NP  01/16/2022, 8:15 AM

## 2022-01-18 NOTE — Telephone Encounter (Signed)
I s/w the pt and she has been scheduled to see Edd Fabian, FNP for pre op clearance. Pt is grateful for the call and the help today. 01/23/22 @ 1:55 at [facility] office.

## 2022-01-20 NOTE — Progress Notes (Deleted)
Cardiology Clinic Note   Patient Name: Shannon [name] Date of Encounter: 01/21/2022  Primary Care Provider:  Sherral Hammers, FNP Primary Cardiologist:  Shannon Batty, MD  Patient Profile    Shannon [name] 71 year old female presents to the clinic today for follow-up evaluation of her hypertension, palpitations, and preoperative cardiac evaluation.  Past Medical History    Past Medical History:  Diagnosis Date   Anxiety    Arthritis    "right knee" (05/26/2014)   Asthma    Chronic lower back pain    Dysrhythmia    irregular heart beat   GERD (gastroesophageal reflux disease)    HLD (hyperlipidemia)    Hypertension    Pneumonia    Stroke [facility])    TIA - ?2021   TIA (transient ischemic attack) 04/2018   Past Surgical History:  Procedure Laterality Date   BRONCHIAL BIOPSY  07/10/2021   Procedure: BRONCHIAL BIOPSIES;  Surgeon: Shannon Igo, DO;  Location: [facility] ENDOSCOPY;  Service: Pulmonary;;   BRONCHIAL BRUSHINGS  07/10/2021   Procedure: BRONCHIAL BRUSHINGS;  Surgeon: Shannon Igo, DO;  Location: [facility] ENDOSCOPY;  Service: Pulmonary;;   BRONCHIAL NEEDLE ASPIRATION BIOPSY  07/10/2021   Procedure: BRONCHIAL NEEDLE ASPIRATION BIOPSIES;  Surgeon: Shannon Igo, DO;  Location: [facility] ENDOSCOPY;  Service: Pulmonary;;   BRONCHIAL WASHINGS  07/10/2021   Procedure: BRONCHIAL WASHINGS;  Surgeon: Shannon Igo, DO;  Location: [facility] ENDOSCOPY;  Service: Pulmonary;;   ESOPHAGOGASTRODUODENOSCOPY N/A 05/27/2014   Procedure: ESOPHAGOGASTRODUODENOSCOPY (EGD);  Surgeon: Shannon Hawking, MD;  Location: [facility] ENDOSCOPY;  Service: Endoscopy;  Laterality: N/A;   FRACTURE SURGERY     PATELLA FRACTURE SURGERY Right ~ 2009   TONSILLECTOMY  ~ 1970   VAGINAL HYSTERECTOMY  1980's   VIDEO BRONCHOSCOPY WITH RADIAL ENDOBRONCHIAL ULTRASOUND  07/10/2021   Procedure: RADIAL ENDOBRONCHIAL ULTRASOUND;  Surgeon: Shannon Igo, DO;  Location: [facility] ENDOSCOPY;  Service: Pulmonary;;     Allergies  Allergies  Allergen Reactions   Amoxicillin-Pot Clavulanate Itching   Other Other (See Comments)    08/06/2018 -    06/05/2016 -    09/15/2014 -    06/16/2014 -    History of Present Illness    Shannon [name] is a PMH of HTN, TIA 2020, CAD by CT 3/22, OSA, asthma, community-acquired pneumonia, COPD, GERD, dysphagia, AKI, HLD, chest discomfort, hypokalemia, and alcohol intoxication.  She was initially seen by Dr. Allyson [name] on 08/17/2019.  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  She reported that she had smoked for some time and it stopped about 20 years ago.  She denied family history of heart disease.  She noted reactive airway disease and was on bronchodilators.  She had 2 episodes of tachypalpitations 4/21 but denied further episodes.  Her echocardiogram 3/20 showed normal LV function.  She was seen in follow-up by Dr. Allyson [name] on 03/29/2020.  During that time her cardiac event monitor 7/21 was reviewed which showed occasional PVCs and PACs with short runs of PSVT and NSVT.  She reported only having 2 episodes of tachypalpitations in the previous 8 months.  She was noted to be moderately obese.  It was felt that OSA may be contributing to her palpitations.  She was noted to have 2.6 AHI per hour with sleep study.  Her EKG during sleep study showed PVCs.  There was no indication for CPAP therapy.  Patient was encouraged to avoid supine sleeping, alcohol, sedatives, CNS depressants, practice good sleep hygiene and weight management along with  regular exercise. She was seen virtually by Dr. Rennis [name] for hyperlipidemia and ezetimibe was added to her medication regimen.  She presents to the clinic today for follow-up evaluation and states***  *** denies chest pain, shortness of breath, lower extremity edema, fatigue, palpitations, melena, hematuria, hemoptysis, diaphoresis, weakness, presyncope, syncope, orthopnea, and PND.  Palpitations-denies recent episodes of irregular or accelerated heart rate.   Avoiding triggers.  Sleep study previously did not indicate OSA. Continue to monitor Heart healthy low-sodium diet-salty 6 given Increase physical activity as tolerated Avoid triggers caffeine, chocolate, EtOH, dehydration etc.  Coronary artery disease-denies recent episodes of arm neck back or chest discomfort.  Coronary artery disease identified by CT 04/21/2020. Continue atorvastatin Heart healthy low-sodium high-fiber diet  Hyperlipidemia-LDL*** Continue atorvastatin Heart healthy low-sodium high-fiber diet Increase physical activity as tolerated Repeat fasting lipids and LFTs***  Essential hypertension-BP today***.  Well-controlled at home. Continue valsartan Heart healthy low-sodium diet-salty 6 given Increase physical activity as tolerated  Sleep disturbance-previously evaluated with sleep study.  Noted to have AHI of 2.6 and was not diagnosed with OSA.  Previously felt that her sleep disturbance may be contributing to palpitations. Avoid supine sleep, sedatives, EtOH, practice sleep hygiene Continue weight loss  Preoperative cardiac evaluation-EGD, colonoscopy, [facility], Dr. Elnoria [name]    Primary Cardiologist: Shannon Batty, MD  Chart reviewed as part of pre-operative protocol coverage. Given past medical history and time since last visit, based on ACC/AHA guidelines, Shannon [name] would be at acceptable risk for the planned procedure without further cardiovascular testing.   Patient was advised that if she*** develops new symptoms prior to surgery to contact our office to arrange a follow-up appointment.  He verbalized understanding.  Patient was Plavix as prescribed by neurology for TIA.  Recommendations for holding Plavix will need to come from prescribing provider.  I will route this recommendation to the requesting party via Epic fax function and remove from pre-op pool.       Disposition: Follow-up with Shannon [name] or me in 9-12 months.  Home  Medications    Prior to Admission medications   Medication Sig Start Date End Date Taking? Authorizing Provider  albuterol (PROVENTIL HFA;VENTOLIN HFA) 108 (90 BASE) MCG/ACT inhaler Inhale 2 puffs into the lungs every 2 (two) hours as needed for wheezing or shortness of breath (cough). 09/21/12   Ivonne Andrew, PA-C  albuterol (PROVENTIL) (2.5 MG/3ML) 0.083% nebulizer solution Take 3 mLs (2.5 mg total) by nebulization every 6 (six) hours as needed for wheezing or shortness of breath. 01/07/22   Martina Sinner, MD  Aspirin-Acetaminophen-Caffeine (GOODY HEADACHE PO) Take 2 packets by mouth daily as needed (pain).    [provider]  atorvastatin (LIPITOR) 80 MG tablet Take 1 tablet (80 mg total) by mouth daily at 6 PM. 05/05/18   Nyra Market, MD  busPIRone (BUSPAR) 10 MG tablet Take 10 mg by mouth 2 (two) times daily. 05/30/20   [provider]  cefdinir (OMNICEF) 300 MG capsule Take 1 capsule (300 mg total) by mouth 2 (two) times daily. 01/07/22   Martina Sinner, MD  Cholecalciferol (VITAMIN D) 50 MCG (2000 UT) tablet Take 2,000 Units by mouth daily.    [provider]  clopidogrel (PLAVIX) 75 MG tablet Take 1 tablet (75 mg total) by mouth daily. 06/09/18   Micki Riley, MD  desloratadine (CLARINEX) 5 MG tablet Take 5 mg by mouth daily.    [provider]  ezetimibe (ZETIA) 10 MG tablet Take 10 mg by  mouth daily. 05/11/21   [provider]  FLUoxetine (PROZAC) 20 MG capsule Take 20 mg by mouth daily. 04/19/20   [provider]  fluticasone (FLONASE) 50 MCG/ACT nasal spray Place 1 spray into both nostrils daily.    [provider]  Fluticasone-Salmeterol (ADVAIR) 500-50 MCG/DOSE AEPB Inhale 1 puff into the lungs 2 (two) times daily.    [provider]  gabapentin (NEURONTIN) 600 MG tablet Take 600 mg by mouth 3 (three) times daily.    [provider]  meloxicam (MOBIC) 7.5 MG tablet Take 7.5 mg by mouth daily.  05/24/21   [provider]  metroNIDAZOLE (FLAGYL) 500 MG tablet Take 1 tablet (500 mg total) by mouth 3 (three) times daily. 01/07/22   Martina Sinner, MD  montelukast (SINGULAIR) 10 MG tablet Take 1 tablet (10 mg total) by mouth at bedtime. 05/03/21   Martina Sinner, MD  pantoprazole (PROTONIX) 40 MG tablet TAKE 1 TABLET BY MOUTH EVERY DAY 09/28/21   Martina Sinner, MD  valsartan (DIOVAN) 80 MG tablet Take 80 mg by mouth daily. 05/30/19   [provider]    Family History    Family History  Problem Relation Age of Onset   Hypertension Mother    Hypertension Father    Colon cancer Father    Hypertension Sister    She indicated that the status of her mother is unknown. She indicated that the status of her father is unknown. She indicated that the status of her sister is unknown.  Social History    Social History   Socioeconomic History   Marital status: Widowed    Spouse name: Not on file   Number of children: Not on file   Years of education: Not on file   Highest education level: Not on file  Occupational History   Not on file  Tobacco Use   Smoking status: Former    Packs/day: 0.50    Years: 30.00    Total pack years: 15.00    Types: Cigarettes    Quit date: 11/10/1998    Years since quitting: 23.2   Smokeless tobacco: Never  Vaping Use   Vaping Use: Never used  Substance and Sexual Activity   Alcohol use: Yes    Comment: occasional   Drug use: No   Sexual activity: Not Currently  Other Topics Concern   Not on file  Social History Narrative   Not on file   Social Determinants of Health   Financial Resource Strain: Not on file  Food Insecurity: Not on file  Transportation Needs: Not on file  Physical Activity: Not on file  Stress: Not on file  Social Connections: Not on file  Intimate Partner Violence: Not on file     Review of Systems    General:  No chills, fever, night sweats or weight changes.  Cardiovascular:  No chest  pain, dyspnea on exertion, edema, orthopnea, palpitations, paroxysmal nocturnal dyspnea. Dermatological: No rash, lesions/masses Respiratory: No cough, dyspnea Urologic: No hematuria, dysuria Abdominal:   No nausea, vomiting, diarrhea, bright red blood per rectum, melena, or hematemesis Neurologic:  No visual changes, wkns, changes in mental status. All other systems reviewed and are otherwise negative except as noted above.  Physical Exam    VS:  There were no vitals taken for this visit. , BMI There is no height or weight on file to calculate BMI. GEN: Well nourished, well developed, in no acute distress. HEENT: normal. Neck: Supple, no JVD,  carotid bruits, or masses. Cardiac: RRR, no murmurs, rubs, or gallops. No clubbing, cyanosis, edema.  Radials/DP/PT 2+ and equal bilaterally.  Respiratory:  Respirations regular and unlabored, clear to auscultation bilaterally. GI: Soft, nontender, nondistended, BS + x 4. MS: no deformity or atrophy. Skin: warm and dry, no rash. Neuro:  Strength and sensation are intact. Psych: Normal affect.  Accessory Clinical Findings    Recent Labs: 06/21/2021: Magnesium 2.1 01/07/2022: ALT 21; BUN 11; Creatinine, Ser 1.01; Hemoglobin 12.3; Platelets 233.0; Potassium 3.9; Sodium 142   Recent Lipid Panel    Component Value Date/Time   CHOL 193 06/17/2019 1319   TRIG 71 06/17/2019 1319   HDL 71 06/17/2019 1319   CHOLHDL 2.7 06/17/2019 1319   CHOLHDL 3.2 05/04/2018 1030   VLDL 19 05/04/2018 1030   LDLCALC 109 (H) 06/17/2019 1319    No BP recorded.  {Refresh Note OR Click here to enter BP  :1}***    ECG personally reviewed by me today- *** - No acute changes  Echocardiogram 05/04/2018  IMPRESSIONS     1. The left ventricle has low normal systolic function, with an ejection  fraction of 50-55%. The cavity size was mildly dilated. Left ventricular  diastolic parameters were normal. No evidence of left ventricular regional  wall motion  abnormalities.   2. The right ventricle has normal systolic function. The cavity was  normal. There is no increase in right ventricular wall thickness.   3. The pericardial effusion is posterior to the left ventricle.   4. Trivial pericardial effusion is present.   5. The mitral valve is abnormal. Mild thickening of the mitral valve  leaflet. Mild calcification of the mitral valve leaflet.   6. There is a small area of thickening below the mitral valve, on the  ventricular side of the anterior leaflet, that may be thickened chordae or  degenerative valve.   7. The aortic valve is tricuspid Mild thickening of the aortic valve Mild  calcification of the aortic valve. Aortic valve regurgitation is mild by  color flow Doppler.   Cardiac event monitor 10/07/2019  1: Normal sinus rhythm/sinus tachycardia 2: Occasional PACs, frequent PVCs 3: Short runs of SVT and NSVT   Assessment & Plan   1.  ***   Thomasene Ripple. Dontae Minerva NP-C     01/21/2022, 9:00 AM [facility] [address] Office [phone] Fax [phone]    I spent***minutes examining this patient, reviewing medications, and using patient centered shared decision making involving her cardiac care.  Prior to her visit I spent greater than 20 minutes reviewing her past medical history,  medications, and prior cardiac tests.

## 2022-01-23 ENCOUNTER — Ambulatory Visit (INDEPENDENT_AMBULATORY_CARE_PROVIDER_SITE_OTHER): Payer: Medicare HMO | Admitting: Pulmonary Disease

## 2022-01-23 ENCOUNTER — Encounter: Payer: Self-pay | Admitting: Pulmonary Disease

## 2022-01-23 ENCOUNTER — Ambulatory Visit (INDEPENDENT_AMBULATORY_CARE_PROVIDER_SITE_OTHER): Payer: Medicare HMO

## 2022-01-23 ENCOUNTER — Ambulatory Visit: Payer: Medicare HMO | Admitting: General Practice

## 2022-01-23 VITALS — BP 138/86 | HR 113 | Ht 66.0 in | Wt 147.4 lb

## 2022-01-23 DIAGNOSIS — J432 Centrilobular emphysema: Secondary | ICD-10-CM | POA: Diagnosis not present

## 2022-01-23 DIAGNOSIS — J181 Lobar pneumonia, unspecified organism: Secondary | ICD-10-CM | POA: Diagnosis not present

## 2022-01-23 DIAGNOSIS — R590 Localized enlarged lymph nodes: Secondary | ICD-10-CM

## 2022-01-23 MED ORDER — PREDNISONE 10 MG PO TABS: ORAL_TABLET | ORAL | 0 refills | Status: AC

## 2022-01-23 NOTE — Patient Instructions (Signed)
We will check a CT PET scan to further evaluate your abnormal lung findings.   Start prednisone taper to help with inflammation of your lungs  Follow up in 4 weeks.

## 2022-01-23 NOTE — Progress Notes (Signed)
Synopsis: Referred in March 2023 for shortness of breath by Vanita Panda, MD  Subjective:   PATIENT ID: Shannon [name]: female DOB: Jul 25, 1950, MRN: [id number]  HPI  Chief Complaint  Patient presents with   Follow-up   Shannon [name] is a 71 year old woman, former smoker with hypertension, GERD and asthma who returns to pulmonary clinic for emphysema and abnormal chest imaging.   She does not feel much better after her antibiotic therapy. She denies fevers or chills but is having some night sweats. She has fatigue, decreased appetite and feels dizzy. Her cough and sputum production are improved. She is nauseated and has vomiting over the past week.   She reports she is getting her esophagus stretched on 02/05/22.   OV 01/07/22 She reports about an 8lb weight loss since last visit with productive cough and some night sweats. Repeat CT Chest scan shows resolution of the LUL infiltrate that was biopsy positive for aspergillus but new right sided findings with segmental consolidation with air bronchograms in right lower lobe, new right paratracheal adenopathy and mild atelectasis and effusion in the posterior right upper lobe.   She does report dysphagia and has an appointment to see GI later this month regarding possible need for EGD and dilation which she has required in the past.   OV 08/08/21 She has done well since hospitaliztion in May. She had nav bronch performed on 5/23 for RUL nodule which was negative for malignancy. BAL grew aspergillus.   She continues on advair 500-42mg 1 puff twice daily.   PFTs today show moderate diffusion defect.   Initial OV 05/03/21 Patient reports using Advair 500-50 mcg 1 puff twice daily over the last 15 to 20 years without much issue until recently she has noticed increased wheezing in the evening at bedtime.  She uses albuterol inhaler with relief of her wheezing.  She denies nighttime awakenings from her sleep with cough wheezing  or shortness of breath.  Cold air and hot humid days seem to bother her asthma symptoms along with strong cleaning agents.  She denies any issues with strong perfumes or colognes.  She also reports increased reflux over recent months and has complaints of food getting stuck in her chest similar to before when she required esophageal dilation by gastroenterology.  She has upcoming appointment with her GI team for follow-up.  She is eating 1 hour prior to bedtime.  Seasonal allergies can also affect her asthma symptoms especially spring season.  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  She smoked half a pack a day for 30 years.  She was exposed to secondhand smoke in childhood.  She currently lives with her son.  Past Medical History:  Diagnosis Date   Anxiety    Arthritis    "right knee" (05/26/2014)   Asthma    Chronic lower back pain    Dysrhythmia    irregular heart beat   GERD (gastroesophageal reflux disease)    HLD (hyperlipidemia)    Hypertension    Pneumonia    Stroke ([facility]    TIA - ?2021   TIA (transient ischemic attack) 04/2018     Family History  Problem Relation Age of Onset   Hypertension Mother    Hypertension Father    Colon cancer Father    Hypertension Sister      Social History   Socioeconomic History  Marital status: Widowed    Spouse name: Not on file   Number of children: Not on file   Years of education: Not on file   Highest education level: Not on file  Occupational History   Not on file  Tobacco Use   Smoking status: Former    Packs/day: 0.50    Years: 30.00    Total pack years: 15.00    Types: Cigarettes    Quit date: 11/10/1998    Years since quitting: 23.2   Smokeless tobacco: Never  Vaping Use   Vaping Use: Never used  Substance and Sexual Activity   Alcohol use: Yes    Comment: occasional   Drug use: No   Sexual activity: Not Currently  Other Topics Concern    Not on file  Social History Narrative   Not on file   Social Determinants of Health   Financial Resource Strain: Not on file  Food Insecurity: Not on file  Transportation Needs: Not on file  Physical Activity: Not on file  Stress: Not on file  Social Connections: Not on file  Intimate Partner Violence: Not on file     Allergies  Allergen Reactions   Amoxicillin-Pot Clavulanate Itching   Other Other (See Comments)    08/06/2018 -    06/05/2016 -    09/15/2014 -    06/16/2014 -     Outpatient Medications Prior to Visit  Medication Sig Dispense Refill   albuterol (PROVENTIL HFA;VENTOLIN HFA) 108 (90 BASE) MCG/ACT inhaler Inhale 2 puffs into the lungs every 2 (two) hours as needed for wheezing or shortness of breath (cough). 1 Inhaler 0   albuterol (PROVENTIL) (2.5 MG/3ML) 0.083% nebulizer solution Take 3 mLs (2.5 mg total) by nebulization every 6 (six) hours as needed for wheezing or shortness of breath. 75 mL 12   Aspirin-Acetaminophen-Caffeine (GOODY HEADACHE PO) Take 2 packets by mouth daily as needed (pain).     atorvastatin (LIPITOR) 80 MG tablet Take 1 tablet (80 mg total) by mouth daily at 6 PM. 30 tablet 2   busPIRone (BUSPAR) 10 MG tablet Take 10 mg by mouth 2 (two) times daily.     Cholecalciferol (VITAMIN D) 50 MCG (2000 UT) tablet Take 2,000 Units by mouth daily.     clopidogrel (PLAVIX) 75 MG tablet Take 1 tablet (75 mg total) by mouth daily. 30 tablet 0   desloratadine (CLARINEX) 5 MG tablet Take 5 mg by mouth daily.     ezetimibe (ZETIA) 10 MG tablet Take 10 mg by mouth daily.     FLUoxetine (PROZAC) 20 MG capsule Take 20 mg by mouth daily.     fluticasone (FLONASE) 50 MCG/ACT nasal spray Place 1 spray into both nostrils daily.     meloxicam (MOBIC) 7.5 MG tablet Take 7.5 mg by mouth daily.     montelukast (SINGULAIR) 10 MG tablet Take 1 tablet (10 mg total) by mouth at bedtime. 30 tablet 11   pantoprazole (PROTONIX) 40 MG tablet TAKE 1 TABLET BY MOUTH EVERY DAY 90  tablet 1   valsartan (DIOVAN) 80 MG tablet Take 80 mg by mouth daily.     cefdinir (OMNICEF) 300 MG capsule Take 1 capsule (300 mg total) by mouth 2 (two) times daily. (Patient not taking: Reported on 01/25/2022) 28 capsule 0   Fluticasone-Salmeterol (ADVAIR) 500-50 MCG/DOSE AEPB Inhale 1 puff into the lungs 2 (two) times daily.     metroNIDAZOLE (FLAGYL) 500 MG tablet Take 1 tablet (500 mg total) by mouth 3 (  three) times daily. (Patient not taking: Reported on 01/25/2022) 42 tablet 0   gabapentin (NEURONTIN) 600 MG tablet Take 600 mg by mouth 3 (three) times daily. (Patient not taking: Reported on 01/25/2022)     No facility-administered medications prior to visit.   Review of Systems  Constitutional:  Positive for weight loss. Negative for chills, fever and malaise/fatigue.  HENT:  Negative for congestion and sinus pain.   Eyes: Negative.   Respiratory:  Positive for cough and sputum production. Negative for hemoptysis, shortness of breath and wheezing.   Cardiovascular:  Negative for chest pain, palpitations, orthopnea, claudication and leg swelling.  Gastrointestinal:  Negative for abdominal pain, heartburn, nausea and vomiting.  Genitourinary: Negative.   Musculoskeletal:  Negative for joint pain and myalgias.  Skin:  Negative for rash.  Neurological:  Negative for weakness.  Endo/Heme/Allergies: Negative.   Psychiatric/Behavioral:  The patient is not nervous/anxious.     Objective:   Vitals:   01/23/22 1423  BP: 138/86  Pulse: (!) 113  SpO2: 98%  Weight: 147 lb 6.4 oz (66.9 kg)  Height: _0  (1.676 m)    Physical Exam Constitutional:      General: She is not in acute distress.    Appearance: She is ill-appearing.  HENT:     Head: Normocephalic and atraumatic.  Eyes:     General: No scleral icterus.    Conjunctiva/sclera: Conjunctivae normal.  Cardiovascular:     Rate and Rhythm: Normal rate and regular rhythm.     Pulses: Normal pulses.     Heart sounds: Normal  heart sounds. No murmur heard. Pulmonary:     Effort: Pulmonary effort is normal.     Breath sounds: Examination of the right-lower field reveals decreased breath sounds. Decreased breath sounds present. No wheezing, rhonchi or rales.  Musculoskeletal:     Right lower leg: No edema.     Left lower leg: No edema.  Lymphadenopathy:     Cervical: No cervical adenopathy.  Skin:    General: Skin is warm and dry.  Neurological:     General: No focal deficit present.     Mental Status: She is alert.  Psychiatric:        Mood and Affect: Mood normal.        Behavior: Behavior normal.        Thought Content: Thought content normal.        Judgment: Judgment normal.    CBC    Component Value Date/Time   WBC 5.4 01/07/2022 1200   RBC 4.12 01/07/2022 1200   HGB 12.3 01/07/2022 1200   HCT 37.0 01/07/2022 1200   PLT 233.0 01/07/2022 1200   MCV 89.7 01/07/2022 1200   MCH 31.0 06/20/2021 0511   MCHC 33.3 01/07/2022 1200   RDW 20.1 (H) 01/07/2022 1200   LYMPHSABS 1.2 01/07/2022 1200   MONOABS 0.5 01/07/2022 1200   EOSABS 0.2 01/07/2022 1200   BASOSABS 0.0 01/07/2022 1200      Latest Ref Rng & Units 01/07/2022   12:00 PM 06/21/2021    4:55 AM 06/20/2021    5:11 AM  BMP  Glucose 70 - 99 mg/dL 111  187  232   BUN 6 - 23 mg/dL _1 Creatinine 0.40 - 1.20 mg/dL 1.01  1.04  1.40   Sodium 135 - 145 mEq/L 142  137  136   Potassium 3.5 - 5.1 mEq/L 3.9  4.3  3.4   Chloride 96 - 112  mEq/L 108  108  108   CO2 19 - 32 mEq/L _0 Calcium 8.4 - 10.5 mg/dL 9.2  8.6  7.9    Chest imaging: CT Chest 12/31/21 1. New segmental consolidation with air bronchograms in the RIGHT lower lobe is concerning for pneumonia. Recommend clinical correlation for pulmonary infection. 2. New RIGHT paratracheal adenopathy with differential including reactive adenopathy related to RIGHT lower lobe pneumonia versus metastatic adenopathy. The nodes are larger than typically present with reactive  adenopathy. Patient may benefit from an FDG PET scan if no signs of current pulmonary infection. Regardless patient warts follow-up CT scan to demonstrate resolution pneumonia and adenopathy. 3. Resolution of the LEFT upper lobe nodule of concern. 4. Mild atelectasis and effusion in the posterior RIGHT upper lobe.  CT Chest 06/20/21 Spiculated lesion in the left upper lobe with associated small nodule suspicious for neoplasm till proven otherwise. Multidisciplinary workup is recommended.   Focal infiltrate in the medial aspect of the right lower lobe.   Aortic Atherosclerosis (ICD10-I70.0) and Emphysema  CT Coronary Scan 04/21/20 1.  No acute findings in the imaged extracardiac chest. 2.  Aortic Atherosclerosis (ICD10-I70.0). 3. Right middle lobe 4 mm pulmonary nodule. No follow-up needed if patient is low-risk. Non-contrast chest CT can be considered in 12 months if patient is high-risk. This recommendation follows the consensus statement: Guidelines for Management of Incidental Pulmonary Nodules Detected on CT Images: From the Fleischner Society 2017; Radiology 2017; 284:228-243. 4.  Tiny hiatal hernia.  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 LV mildly dilated. RV size and systolic function are normal.   Heart Catheterization:  Assessment & Plan:   Lobar pneumonia, unspecified organism ([place]) - Plan: DG Chest 2 View, predniSONE (DELTASONE)  10 MG tablet  Mediastinal adenopathy - Plan: NM PET Image Initial (PI) Skull Base To Thigh  Centrilobular emphysema (HCC)  Discussion: Shannon [name] is a 71 year old woman, former smoker with hypertension, GERD and asthma who returns to pulmonary clinic for emphysema and abnormal chest imaging.   She reports issues with dysphagia and has new right lower lobe air bronchograms with right sided paratracheal adenopathy, RUL atelectasis and small effusion.   We will check CXR today.   She continues to have symptoms of fatigue with resolution of fevers and chills.  Differential includes aspiration pneumonia with organizing features vs aspergillus pneumonia vs malignancy.  We will start her steroid taper given her aspergillus IgG level is not elevated and treat for possible inflammatory component.   Follow up in 4 weeks.  Freda Jackson, MD [facility] Office: [phone]    Current Outpatient Medications:    albuterol (PROVENTIL HFA;VENTOLIN HFA) 108 (90 BASE) MCG/ACT  inhaler, Inhale 2 puffs into the lungs every 2 (two) hours as needed for wheezing or shortness of breath (cough)., Disp: 1 Inhaler, Rfl: 0   albuterol (PROVENTIL) (2.5 MG/3ML) 0.083% nebulizer solution, Take 3 mLs (2.5 mg total) by nebulization every 6 (six) hours as needed for wheezing or shortness of breath., Disp: 75 mL, Rfl: 12   Aspirin-Acetaminophen-Caffeine (GOODY HEADACHE PO), Take 2 packets by mouth daily as needed (pain)., Disp: , Rfl:    atorvastatin (LIPITOR) 80 MG tablet, Take 1 tablet (80 mg total) by mouth daily at 6 PM., Disp: 30 tablet, Rfl: 2   busPIRone (BUSPAR) 10 MG tablet, Take 10 mg by mouth 2 (two) times daily., Disp: , Rfl:    Cholecalciferol (VITAMIN D) 50 MCG (2000 UT) tablet, Take 2,000 Units by mouth daily., Disp: , Rfl:    clopidogrel (PLAVIX) 75 MG tablet, Take 1 tablet (75 mg total) by mouth daily., Disp: 30 tablet, Rfl: 0   desloratadine (CLARINEX) 5 MG tablet,  Take 5 mg by mouth daily., Disp: , Rfl:    ezetimibe (ZETIA) 10 MG tablet, Take 10 mg by mouth daily., Disp: , Rfl:    FLUoxetine (PROZAC) 20 MG capsule, Take 20 mg by mouth daily., Disp: , Rfl:    fluticasone (FLONASE) 50 MCG/ACT nasal spray, Place 1 spray into both nostrils daily., Disp: , Rfl:    meloxicam (MOBIC) 7.5 MG tablet, Take 7.5 mg by mouth daily., Disp: , Rfl:    montelukast (SINGULAIR) 10 MG tablet, Take 1 tablet (10 mg total) by mouth at bedtime., Disp: 30 tablet, Rfl: 11   pantoprazole (PROTONIX) 40 MG tablet, TAKE 1 TABLET BY MOUTH EVERY DAY, Disp: 90 tablet, Rfl: 1   valsartan (DIOVAN) 80 MG tablet, Take 80 mg by mouth daily., Disp: , Rfl:    fluticasone-salmeterol (WIXELA INHUB) 500-50 MCG/ACT AEPB, Inhale 1 puff into the lungs in the morning and at bedtime., Disp: 60 each, Rfl: 6

## 2022-01-25 ENCOUNTER — Ambulatory Visit: Payer: Medicare HMO | Attending: General Practice | Admitting: Physician Assistant

## 2022-01-25 ENCOUNTER — Encounter: Payer: Self-pay | Admitting: Physician Assistant

## 2022-01-25 VITALS — BP 123/82 | HR 106 | Ht 66.0 in | Wt 151.0 lb

## 2022-01-25 DIAGNOSIS — G4733 Obstructive sleep apnea (adult) (pediatric): Secondary | ICD-10-CM | POA: Diagnosis not present

## 2022-01-25 DIAGNOSIS — E785 Hyperlipidemia, unspecified: Secondary | ICD-10-CM | POA: Diagnosis not present

## 2022-01-25 DIAGNOSIS — R002 Palpitations: Secondary | ICD-10-CM

## 2022-01-25 DIAGNOSIS — I10 Essential (primary) hypertension: Secondary | ICD-10-CM | POA: Diagnosis not present

## 2022-01-25 DIAGNOSIS — R0609 Other forms of dyspnea: Secondary | ICD-10-CM

## 2022-01-25 MED ORDER — CARVEDILOL 3.125 MG PO TABS: 3.1250 mg | ORAL_TABLET | Freq: Two times a day (BID) | ORAL | 3 refills | Status: DC

## 2022-01-25 NOTE — Progress Notes (Signed)
Office Visit    Patient Name: Shannon [name] Date of Encounter: 01/25/2022  PCP:  Sherral Hammers, FNP   [facility]  Cardiologist:  Nanetta Batty, MD  Advanced Practice Provider:  No care team member to display Electrophysiologist:  None   HPI    Shannon [name] is a 71 y.o. female with a past medical history of TIA/stroke, GERD, hypertension, hyperlipidemia, and anxiety who presents today for follow-up visit.   She does not have a family history of heart disease. She has never had a heart attack however, she did have a TIA back in 2020. She was last seen 03/29/20 and denied chest pain and DOE. She has reactive airway disease and is on bronchodilators. She had two episodes of tachypalpitations back in April 2021 but had not had any since. She had an echo that was essentially normal.    She did have an event monitor 09/03/19 that showed occasional PACs/PVCs with short runs of PSVT and NSVT. She only had two episodes of palpitations in the past 8 months or so. She was assumed to have OSA due to her obesity and daytime somnolence.   Today, she states she is having an increase in SOB and tachycardia. She feels like this has gotten worse over the last year. It has been almost two years since she last saw [place] in the office. She has not had any chest pains. Sometimes she feels like her heart is beating fast. She has a history of PACs and PVCs but none were seen on EKG today. Discussed repeat monitor if they become more frequent or bothersome.   Reports no chest pain, pressure, or tightness. No edema, orthopnea, PND.   Past Medical History    Past Medical History:  Diagnosis Date   Anxiety    Arthritis    "right knee" (05/26/2014)   Asthma    Chronic lower back pain    Dysrhythmia    irregular heart beat   GERD (gastroesophageal reflux disease)    HLD (hyperlipidemia)    Hypertension    Pneumonia    Stroke [facility])    TIA - ?2021   TIA (transient  ischemic attack) 04/2018   Past Surgical History:  Procedure Laterality Date   BRONCHIAL BIOPSY  07/10/2021   Procedure: BRONCHIAL BIOPSIES;  Surgeon: Josephine Igo, DO;  Location: [facility] ENDOSCOPY;  Service: Pulmonary;;   BRONCHIAL BRUSHINGS  07/10/2021   Procedure: BRONCHIAL BRUSHINGS;  Surgeon: Josephine Igo, DO;  Location: [facility] ENDOSCOPY;  Service: Pulmonary;;   BRONCHIAL NEEDLE ASPIRATION BIOPSY  07/10/2021   Procedure: BRONCHIAL NEEDLE ASPIRATION BIOPSIES;  Surgeon: Josephine Igo, DO;  Location: [facility] ENDOSCOPY;  Service: Pulmonary;;   BRONCHIAL WASHINGS  07/10/2021   Procedure: BRONCHIAL WASHINGS;  Surgeon: Josephine Igo, DO;  Location: [facility] ENDOSCOPY;  Service: Pulmonary;;   ESOPHAGOGASTRODUODENOSCOPY N/A 05/27/2014   Procedure: ESOPHAGOGASTRODUODENOSCOPY (EGD);  Surgeon: Jeani Hawking, MD;  Location: [facility] ENDOSCOPY;  Service: Endoscopy;  Laterality: N/A;   FRACTURE SURGERY     PATELLA FRACTURE SURGERY Right ~ 2009   TONSILLECTOMY  ~ 1970   VAGINAL HYSTERECTOMY  1980's   VIDEO BRONCHOSCOPY WITH RADIAL ENDOBRONCHIAL ULTRASOUND  07/10/2021   Procedure: RADIAL ENDOBRONCHIAL ULTRASOUND;  Surgeon: Josephine Igo, DO;  Location: [facility] ENDOSCOPY;  Service: Pulmonary;;    Allergies  Allergies  Allergen Reactions   Amoxicillin-Pot Clavulanate Itching   Other Other (See Comments)    08/06/2018 -    06/05/2016 -  09/15/2014 -    06/16/2014 -    EKGs/Labs/Other Studies Reviewed:   The following studies were reviewed today:  Coronary CTA 04/21/20  IMPRESSION: 1. Coronary calcium score of 507. This was 94th percentile for age, gender, and race matched controls.   2. Aortic atherosclerosis noted.   3. Aortic Valve Calcium Score: 1086.    EKG:  EKG is  ordered today.  The ekg ordered today demonstrates ST rate 106 bpm  Recent Labs: 06/21/2021: Magnesium 2.1 01/07/2022: ALT 21; BUN 11; Creatinine, Ser 1.01; Hemoglobin 12.3; Platelets 233.0; Potassium 3.9; Sodium 142  Recent Lipid  Panel    Component Value Date/Time   CHOL 193 06/17/2019 1319   TRIG 71 06/17/2019 1319   HDL 71 06/17/2019 1319   CHOLHDL 2.7 06/17/2019 1319   CHOLHDL 3.2 05/04/2018 1030   VLDL 19 05/04/2018 1030   LDLCALC 109 (H) 06/17/2019 1319    Home Medications   Current Meds  Medication Sig   albuterol (PROVENTIL HFA;VENTOLIN HFA) 108 (90 BASE) MCG/ACT inhaler Inhale 2 puffs into the lungs every 2 (two) hours as needed for wheezing or shortness of breath (cough).   albuterol (PROVENTIL) (2.5 MG/3ML) 0.083% nebulizer solution Take 3 mLs (2.5 mg total) by nebulization every 6 (six) hours as needed for wheezing or shortness of breath.   Aspirin-Acetaminophen-Caffeine (GOODY HEADACHE PO) Take 2 packets by mouth daily as needed (pain).   atorvastatin (LIPITOR) 80 MG tablet Take 1 tablet (80 mg total) by mouth daily at 6 PM.   busPIRone (BUSPAR) 10 MG tablet Take 10 mg by mouth 2 (two) times daily.   Cholecalciferol (VITAMIN D) 50 MCG (2000 UT) tablet Take 2,000 Units by mouth daily.   clopidogrel (PLAVIX) 75 MG tablet Take 1 tablet (75 mg total) by mouth daily.   desloratadine (CLARINEX) 5 MG tablet Take 5 mg by mouth daily.   ezetimibe (ZETIA) 10 MG tablet Take 10 mg by mouth daily.   FLUoxetine (PROZAC) 20 MG capsule Take 20 mg by mouth daily.   fluticasone (FLONASE) 50 MCG/ACT nasal spray Place 1 spray into both nostrils daily.   Fluticasone-Salmeterol (ADVAIR) 500-50 MCG/DOSE AEPB Inhale 1 puff into the lungs 2 (two) times daily.   meloxicam (MOBIC) 7.5 MG tablet Take 7.5 mg by mouth daily.   montelukast (SINGULAIR) 10 MG tablet Take 1 tablet (10 mg total) by mouth at bedtime.   pantoprazole (PROTONIX) 40 MG tablet TAKE 1 TABLET BY MOUTH EVERY DAY   predniSONE (DELTASONE) 10 MG tablet Take 4 tablets (40 mg total) by mouth daily with breakfast for 3 days, THEN 3 tablets (30 mg total) daily with breakfast for 3 days, THEN 2 tablets (20 mg total) daily with breakfast for 3 days, THEN 1 tablet (10  mg total) daily with breakfast for 3 days.   valsartan (DIOVAN) 80 MG tablet Take 80 mg by mouth daily.     Review of Systems      All other systems reviewed and are otherwise negative except as noted above.  Physical Exam    VS:  BP 123/82   Pulse (!) 106   Ht 5\' 6"  (1.676 m)   Wt 151 lb (68.5 kg)   SpO2 96%   BMI 24.37 kg/m  , BMI Body mass index is 24.37 kg/m.  Wt Readings from Last 3 Encounters:  01/25/22 151 lb (68.5 kg)  01/23/22 147 lb 6.4 oz (66.9 kg)  01/07/22 153 lb 12.8 oz (69.8 kg)     GEN: Well nourished,  well developed, in no acute distress. HEENT: normal. Neck: Supple, no JVD, carotid bruits, or masses. Cardiac: ST, no murmurs, rubs, or gallops. No clubbing, cyanosis, edema.  Radials/PT 2+ and equal bilaterally.  Respiratory:  Respirations regular and unlabored, clear to auscultation bilaterally. GI: Soft, nontender, nondistended. MS: No deformity or atrophy. Skin: Warm and dry, no rash. Neuro:  Strength and sensation are intact. Psych: Normal affect.  Assessment & Plan    Hypercholesterolemia -primary looks after her cholesterol  -LDL 109 when last evaluated by [place] -continue Lipitor 80mg  daily and zetia 10mg  daily  Hypertension -well controlled  -continue Diovan  3. Palpitations/SOB -add coreg 3.125 mg BID -she had swelling in her legs with metoprolol in the past -echo to r/o structural cause       Disposition: Follow up 1 year with , MD or APP.  Signed, , PA-C 01/25/2022, 3:07 PM [facility]

## 2022-01-25 NOTE — Patient Instructions (Signed)
Medication Instructions:  1.Start carvedilol 3.125 mg twice daily *If you need a refill on your cardiac medications before your next appointment, please call your pharmacy*   Lab Work: None If you have labs (blood work) drawn today and your tests are completely normal, you will receive your results only by: MyChart Message (if you have MyChart) OR A paper copy in the mail If you have any lab test that is abnormal or we need to change your treatment, we will call you to review the results.   Testing/Procedures: Your physician has requested that you have an echocardiogram. Echocardiography is a painless test that uses sound waves to create images of your heart. It provides your doctor with information about the size and shape of your heart and how well your heart's chambers and valves are working. This procedure takes approximately one hour. There are no restrictions for this procedure. Please do NOT wear cologne, perfume, aftershave, or lotions (deodorant is allowed). Please arrive 15 minutes prior to your appointment time.    Follow-Up: At [facility], you and your health needs are our priority.  As part of our continuing mission to provide you with exceptional heart care, we have created designated Provider Care Teams.  These Care Teams include your primary Cardiologist (physician) and Advanced Practice Providers (APPs -  Physician Assistants and Nurse Practitioners) who all work together to provide you with the care you need, when you need it.   Your next appointment:   1 year(s)  The format for your next appointment:   In Person  Provider:   Nanetta Batty, MD   Important Information About Sugar

## 2022-02-04 NOTE — Telephone Encounter (Signed)
  [facility] called to f/u pt.s clearance. They just wanted to make sure that pt is cleared for tomorrows procedure

## 2022-02-04 NOTE — Telephone Encounter (Signed)
I will forward call to pre op provider to please review if pt has been cleared.

## 2022-02-04 NOTE — Telephone Encounter (Signed)
Patient was recently seen by Jari Favre, PA-C, on 01/25/2022 at which time she reported an increase in shortness of breath and tachycardia but denied any chest pain. She was started on Coreg and Echo was ordered for further evaluation. I don't see any mention of need for upcoming surgery in most recent office visit but I think we need to wait on Echo results before providing final recommendations. Echo is currently scheduled for 02/20/2022.  Patient is on Plavix for history of TIA. Therefore, I agree that recommendations for holding Plavix needs to come from PCP or Neurology.  Corrin Parker, PA-C 02/04/2022 3:28 PM

## 2022-02-05 LAB — FUNGUS CULTURE W SMEAR
MICRO NUMBER:: 14224588
SMEAR:: NONE SEEN
SPECIMEN QUALITY:: ADEQUATE

## 2022-02-05 LAB — RESPIRATORY CULTURE OR RESPIRATORY AND SPUTUM CULTURE
MICRO NUMBER:: 14224589
RESULT:: NORMAL
SPECIMEN QUALITY:: ADEQUATE

## 2022-02-13 ENCOUNTER — Ambulatory Visit (HOSPITAL_COMMUNITY): Payer: Medicare HMO

## 2022-02-19 ENCOUNTER — Ambulatory Visit (INDEPENDENT_AMBULATORY_CARE_PROVIDER_SITE_OTHER): Payer: Medicare HMO | Admitting: Pulmonary Disease

## 2022-02-19 ENCOUNTER — Encounter: Payer: Self-pay | Admitting: Pulmonary Disease

## 2022-02-19 VITALS — BP 118/82 | HR 112 | Ht 66.0 in | Wt 142.6 lb

## 2022-02-19 DIAGNOSIS — J4540 Moderate persistent asthma, uncomplicated: Secondary | ICD-10-CM

## 2022-02-19 MED ORDER — FLUTICASONE-SALMETEROL 500-50 MCG/ACT IN AEPB: 1.0000 | INHALATION_SPRAY | Freq: Two times a day (BID) | RESPIRATORY_TRACT | 6 refills | Status: DC

## 2022-02-19 NOTE — Patient Instructions (Addendum)
We have you scheduled for CT PET scan tomorrow 2pm at [facility] in at 1:30pm.   I will give you a call once the results are in.  Follow up in 1 month.

## 2022-02-19 NOTE — Progress Notes (Signed)
Synopsis: Referred in March 2023 for shortness of breath by Vanita Panda, MD  Subjective:   PATIENT ID: Shannon [name] GENDER: female DOB: 02-07-1951, MRN: [id number]  HPI  Chief Complaint  Patient presents with   Follow-up    F/U. States she is still having an issue with SOB and wheezing. Was unable to do the PET scan.    Shannon [name] is a 72 year old woman, former smoker with hypertension, GERD and asthma who returns to pulmonary clinic for emphysema and abnormal chest imaging.   She was treated with extended steroid taper after last visit which seemed to help her feel better but after completion she continues to have significant fatigue. She was not able to have her EGD done due to being on clopidogrel with not knowing if she was able to stop it temporarily. She has lost 5lbs since last visit. She is still not eating or drinking well due to trouble swallowing.   She continues to have productive cough. No fevers, chills or sweats.  OV 01/23/22 Dizzy, fatigue, decreased appetite. No fevers, chills. + Night sweats.  Cough and sputum production are better. Nausea and vomiting since last week.   Urine has turned dark brown, but is clearing up now.   She is getting her esophagus stretch on 12/19.   Joint aches in legs  OV 01/07/22 She reports about an 8lb weight loss since last visit with productive cough and some night sweats. Repeat CT Chest scan shows resolution of the LUL infiltrate that was biopsy positive for aspergillus but new right sided findings with segmental consolidation with air bronchograms in right lower lobe, new right paratracheal adenopathy and mild atelectasis and effusion in the posterior right upper lobe.   She does report dysphagia and has an appointment to see GI later this month regarding possible need for EGD and dilation which she has required in the past.   OV 08/08/21 She has done well since hospitaliztion in May. She had nav bronch performed on  5/23 for RUL nodule which was negative for malignancy. BAL grew aspergillus.   She continues on advair 500-66mg 1 puff twice daily.   PFTs today show moderate diffusion defect.   Initial OV 05/03/21 Patient reports using Advair 500-50 mcg 1 puff twice daily over the last 15 to 20 years without much issue until recently she has noticed increased wheezing in the evening at bedtime.  She uses albuterol inhaler with relief of her wheezing.  She denies nighttime awakenings from her sleep with cough wheezing or shortness of breath.  Cold air and hot humid days seem to bother her asthma symptoms along with strong cleaning agents.  She denies any issues with strong perfumes or colognes.  She also reports increased reflux over recent months and has complaints of food getting stuck in her chest similar to before when she required esophageal dilation by gastroenterology.  She has upcoming appointment with her GI team for follow-up.  She is eating 1 hour prior to bedtime.  Seasonal allergies can also affect her asthma symptoms especially spring season.  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  She smoked half a pack a day for 30 years.  She was exposed to secondhand smoke in childhood.  She currently lives with her son.  Past Medical History:  Diagnosis Date   Anxiety    Arthritis    "right knee" (05/26/2014)  Asthma    Chronic lower back pain    Dysrhythmia    irregular heart beat   GERD (gastroesophageal reflux disease)    HLD (hyperlipidemia)    Hypertension    Pneumonia    Stroke [facility])    TIA - ?2021   TIA (transient ischemic attack) 04/2018     Family History  Problem Relation Age of Onset   Hypertension Mother    Hypertension Father    Colon cancer Father    Hypertension Sister      Social History   Socioeconomic History   Marital status: Widowed    Spouse name: Not on file   Number of children: Not on file    Years of education: Not on file   Highest education level: Not on file  Occupational History   Not on file  Tobacco Use   Smoking status: Former    Packs/day: 0.50    Years: 30.00    Total pack years: 15.00    Types: Cigarettes    Quit date: 11/10/1998    Years since quitting: 23.2   Smokeless tobacco: Never  Vaping Use   Vaping Use: Never used  Substance and Sexual Activity   Alcohol use: Yes    Comment: occasional   Drug use: No   Sexual activity: Not Currently  Other Topics Concern   Not on file  Social History Narrative   Not on file   Social Determinants of Health   Financial Resource Strain: Not on file  Food Insecurity: Not on file  Transportation Needs: Not on file  Physical Activity: Not on file  Stress: Not on file  Social Connections: Not on file  Intimate Partner Violence: Not on file     Allergies  Allergen Reactions   Amoxicillin-Pot Clavulanate Itching   Other Other (See Comments)    08/06/2018 -    06/05/2016 -    09/15/2014 -    06/16/2014 -     Outpatient Medications Prior to Visit  Medication Sig Dispense Refill   albuterol (PROVENTIL HFA;VENTOLIN HFA) 108 (90 BASE) MCG/ACT inhaler Inhale 2 puffs into the lungs every 2 (two) hours as needed for wheezing or shortness of breath (cough). 1 Inhaler 0   albuterol (PROVENTIL) (2.5 MG/3ML) 0.083% nebulizer solution Take 3 mLs (2.5 mg total) by nebulization every 6 (six) hours as needed for wheezing or shortness of breath. 75 mL 12   Aspirin-Acetaminophen-Caffeine (GOODY HEADACHE PO) Take 2 packets by mouth daily as needed (pain).     atorvastatin (LIPITOR) 80 MG tablet Take 1 tablet (80 mg total) by mouth daily at 6 PM. 30 tablet 2   busPIRone (BUSPAR) 10 MG tablet Take 10 mg by mouth 2 (two) times daily.     Cholecalciferol (VITAMIN D) 50 MCG (2000 UT) tablet Take 2,000 Units by mouth daily.     clopidogrel (PLAVIX) 75 MG tablet Take 1 tablet (75 mg total) by mouth daily. 30 tablet 0   desloratadine  (CLARINEX) 5 MG tablet Take 5 mg by mouth daily.     ezetimibe (ZETIA) 10 MG tablet Take 10 mg by mouth daily.     FLUoxetine (PROZAC) 20 MG capsule Take 20 mg by mouth daily.     fluticasone (FLONASE) 50 MCG/ACT nasal spray Place 1 spray into both nostrils daily.     meloxicam (MOBIC) 7.5 MG tablet Take 7.5 mg by mouth daily.     montelukast (SINGULAIR) 10 MG tablet Take 1 tablet (10 mg total) by mouth  at bedtime. 30 tablet 11   pantoprazole (PROTONIX) 40 MG tablet TAKE 1 TABLET BY MOUTH EVERY DAY 90 tablet 1   valsartan (DIOVAN) 80 MG tablet Take 80 mg by mouth daily.     carvedilol (COREG) 3.125 MG tablet Take 1 tablet (3.125 mg total) by mouth 2 (two) times daily. 180 tablet 3   Fluticasone-Salmeterol (ADVAIR) 500-50 MCG/DOSE AEPB Inhale 1 puff into the lungs 2 (two) times daily.     No facility-administered medications prior to visit.   Review of Systems  Constitutional:  Positive for weight loss. Negative for chills, fever and malaise/fatigue.  HENT:  Negative for congestion and sinus pain.   Eyes: Negative.   Respiratory:  Positive for cough and sputum production. Negative for hemoptysis, shortness of breath and wheezing.   Cardiovascular:  Negative for chest pain, palpitations, orthopnea, claudication and leg swelling.  Gastrointestinal:  Negative for abdominal pain, heartburn, nausea and vomiting.  Genitourinary: Negative.   Musculoskeletal:  Negative for joint pain and myalgias.  Skin:  Negative for rash.  Neurological:  Negative for weakness.  Endo/Heme/Allergies: Negative.   Psychiatric/Behavioral:  The patient is not nervous/anxious.     Objective:   Vitals:   02/19/22 1400  BP: 118/82  Pulse: (!) 112  SpO2: 98%  Weight: 142 lb 9.6 oz (64.7 kg)  Height: _0  (1.676 m)    Physical Exam Constitutional:      General: She is not in acute distress.    Appearance: She is not ill-appearing.  HENT:     Head: Normocephalic and atraumatic.  Eyes:     General: No  scleral icterus.    Conjunctiva/sclera: Conjunctivae normal.  Cardiovascular:     Rate and Rhythm: Normal rate and regular rhythm.     Pulses: Normal pulses.     Heart sounds: Normal heart sounds. No murmur heard. Pulmonary:     Effort: Pulmonary effort is normal.     Breath sounds: Normal breath sounds. No wheezing, rhonchi or rales.  Musculoskeletal:     Right lower leg: No edema.     Left lower leg: No edema.  Lymphadenopathy:     Cervical: No cervical adenopathy.  Skin:    General: Skin is warm and dry.  Neurological:     General: No focal deficit present.     Mental Status: She is alert.  Psychiatric:        Mood and Affect: Mood normal.        Behavior: Behavior normal.        Thought Content: Thought content normal.        Judgment: Judgment normal.    CBC    Component Value Date/Time   WBC 5.4 01/07/2022 1200   RBC 4.12 01/07/2022 1200   HGB 12.3 01/07/2022 1200   HCT 37.0 01/07/2022 1200   PLT 233.0 01/07/2022 1200   MCV 89.7 01/07/2022 1200   MCH 31.0 06/20/2021 0511   MCHC 33.3 01/07/2022 1200   RDW 20.1 (H) 01/07/2022 1200   LYMPHSABS 1.2 01/07/2022 1200   MONOABS 0.5 01/07/2022 1200   EOSABS 0.2 01/07/2022 1200   BASOSABS 0.0 01/07/2022 1200      Latest Ref Rng & Units 01/07/2022   12:00 PM 06/21/2021    4:55 AM 06/20/2021    5:11 AM  BMP  Glucose 70 - 99 mg/dL 111  187  232   BUN 6 - 23 mg/dL _1 Creatinine 0.40 - 1.20 mg/dL 1.01  1.04  1.40   Sodium 135 - 145 mEq/L 142  137  136   Potassium 3.5 - 5.1 mEq/L 3.9  4.3  3.4   Chloride 96 - 112 mEq/L 108  108  108   CO2 19 - 32 mEq/L _0 Calcium 8.4 - 10.5 mg/dL 9.2  8.6  7.9    Chest imaging: CXR 01/23/22 Right lung opacities are noted consistent with multifocal pneumonia. Followup PA and lateral chest X-ray is recommended in 3-4 weeks following trial of antibiotic therapy to ensure resolution and exclude underlying malignancy.  CT Chest 12/31/21 1. New segmental consolidation  with air bronchograms in the RIGHT lower lobe is concerning for pneumonia. Recommend clinical correlation for pulmonary infection. 2. New RIGHT paratracheal adenopathy with differential including reactive adenopathy related to RIGHT lower lobe pneumonia versus metastatic adenopathy. The nodes are larger than typically present with reactive adenopathy. Patient may benefit from an FDG PET scan if no signs of current pulmonary infection. Regardless patient warts follow-up CT scan to demonstrate resolution pneumonia and adenopathy. 3. Resolution of the LEFT upper lobe nodule of concern. 4. Mild atelectasis and effusion in the posterior RIGHT upper lobe.  CT Chest 06/20/21 Spiculated lesion in the left upper lobe with associated small nodule suspicious for neoplasm till proven otherwise. Multidisciplinary workup is recommended.   Focal infiltrate in the medial aspect of the right lower lobe.   Aortic Atherosclerosis (ICD10-I70.0) and Emphysema  CT Coronary Scan 04/21/20 1.  No acute findings in the imaged extracardiac chest. 2.  Aortic Atherosclerosis (ICD10-I70.0). 3. Right middle lobe 4 mm pulmonary nodule. No follow-up needed if patient is low-risk. Non-contrast chest CT can be considered in 12 months if patient is high-risk. This recommendation follows the consensus statement: Guidelines for Management of Incidental Pulmonary Nodules Detected on CT Images: From the Fleischner Society 2017; Radiology 2017; 284:228-243. 4.  Tiny hiatal hernia.  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 LV mildly dilated. RV size and systolic function are normal.   Heart Catheterization:  Assessment & Plan:   Moderate persistent asthma without complication - Plan: fluticasone-salmeterol (WIXELA INHUB) 500-50 MCG/ACT AEPB  Discussion: Shannon [name] is a 72 year old woman, former smoker with hypertension, GERD and asthma who returns to pulmonary clinic for emphysema and abnormal chest imaging.   We will have her scheduled for PET CT scan to further evaluate the findings on previous CT scans and chest x-rays.   I have notified her GI team that she was started on clopidogrel for secondary stroke prevention by neurology and likely ok to hold temporarily for her EGD.   Based on her PET CT results we will consider the next best step whether it  be bronchoscopy or thoracentesis based on the pleural effusion changes.   Follow up in 4 weeks.  Freda Jackson, MD [facility] Office: [phone]    Current Outpatient Medications:    albuterol (PROVENTIL HFA;VENTOLIN HFA) 108 (90 BASE) MCG/ACT inhaler, Inhale 2 puffs into the lungs every 2 (two) hours as needed for wheezing or shortness of breath (cough)., Disp: 1 Inhaler, Rfl: 0   albuterol (PROVENTIL) (2.5 MG/3ML) 0.083% nebulizer solution, Take 3 mLs (2.5 mg total) by nebulization every 6 (six) hours as needed for wheezing or shortness of breath., Disp: 75 mL, Rfl: 12   Aspirin-Acetaminophen-Caffeine (GOODY HEADACHE PO), Take 2 packets by mouth daily as needed (pain)., Disp: , Rfl:    atorvastatin (LIPITOR) 80 MG tablet, Take 1 tablet (80 mg total) by mouth daily at 6 PM., Disp: 30 tablet, Rfl: 2   busPIRone (BUSPAR) 10 MG tablet, Take 10 mg by mouth 2 (two) times daily., Disp: , Rfl:    Cholecalciferol (VITAMIN D) 50 MCG (2000 UT) tablet, Take 2,000 Units by mouth daily., Disp: ,  Rfl:    clopidogrel (PLAVIX) 75 MG tablet, Take 1 tablet (75 mg total) by mouth daily., Disp: 30 tablet, Rfl: 0   desloratadine (CLARINEX) 5 MG tablet, Take 5 mg by mouth daily., Disp: , Rfl:    ezetimibe (ZETIA) 10 MG tablet, Take 10 mg by mouth daily., Disp: , Rfl:    FLUoxetine (PROZAC) 20 MG capsule, Take 20 mg by mouth daily., Disp: , Rfl:    fluticasone (FLONASE) 50 MCG/ACT nasal spray, Place 1 spray into both nostrils daily., Disp: , Rfl:    fluticasone-salmeterol (WIXELA INHUB) 500-50 MCG/ACT AEPB, Inhale 1 puff into the lungs in the morning and at bedtime., Disp: 60 each, Rfl: 6   meloxicam (MOBIC) 7.5 MG tablet, Take 7.5 mg by mouth daily., Disp: , Rfl:    montelukast (SINGULAIR) 10 MG tablet, Take 1 tablet (10 mg total) by mouth at bedtime., Disp: 30 tablet, Rfl: 11   pantoprazole (PROTONIX) 40 MG tablet, TAKE 1 TABLET BY MOUTH EVERY DAY, Disp: 90 tablet, Rfl: 1   valsartan (DIOVAN) 80 MG tablet, Take 80 mg by mouth daily., Disp: , Rfl:

## 2022-02-20 ENCOUNTER — Ambulatory Visit (HOSPITAL_COMMUNITY)
Admission: RE | Admit: 2022-02-20 | Discharge: 2022-02-20 | Disposition: A | Discharge status: Home / Self Care | Payer: Medicare HMO | Source: Ambulatory Visit | Attending: Pulmonary Disease | Admitting: Pulmonary Disease

## 2022-02-20 ENCOUNTER — Telehealth: Payer: Self-pay | Admitting: Adult Health

## 2022-02-20 ENCOUNTER — Ambulatory Visit (HOSPITAL_COMMUNITY): Payer: Medicare HMO

## 2022-02-20 DIAGNOSIS — R9389 Abnormal findings on diagnostic imaging of other specified body structures: Secondary | ICD-10-CM | POA: Diagnosis not present

## 2022-02-20 DIAGNOSIS — J90 Pleural effusion, not elsewhere classified: Secondary | ICD-10-CM | POA: Insufficient documentation

## 2022-02-20 DIAGNOSIS — I2510 Atherosclerotic heart disease of native coronary artery without angina pectoris: Secondary | ICD-10-CM | POA: Insufficient documentation

## 2022-02-20 DIAGNOSIS — I517 Cardiomegaly: Secondary | ICD-10-CM | POA: Insufficient documentation

## 2022-02-20 DIAGNOSIS — R590 Localized enlarged lymph nodes: Secondary | ICD-10-CM | POA: Diagnosis not present

## 2022-02-20 DIAGNOSIS — J439 Emphysema, unspecified: Secondary | ICD-10-CM | POA: Insufficient documentation

## 2022-02-20 DIAGNOSIS — I700 Atherosclerosis of aorta: Secondary | ICD-10-CM | POA: Insufficient documentation

## 2022-02-20 LAB — GLUCOSE, CAPILLARY: Glucose-Capillary: 115 mg/dL — ABNORMAL HIGH (ref 70–99)

## 2022-02-20 MED ORDER — FLUDEOXYGLUCOSE F - 18 (FDG) INJECTION
7.1000 | Freq: Once | INTRAVENOUS | Status: AC
  Administered 2022-02-20: 7.4 via INTRAVENOUS

## 2022-02-20 NOTE — Telephone Encounter (Signed)
Please advise to fax [place] a clearance to hold plavix for Shannon [name] to review, sign and send back.

## 2022-02-20 NOTE — Telephone Encounter (Signed)
Sheppard Evens, NP called wanting to know how long the pt needs to be off of her Plavix due to a colonoscopy she will be having done. Please call NP back at [phone] ext [phone] T.A.P.M. @[email or web address] 

## 2022-02-21 ENCOUNTER — Encounter: Payer: Self-pay | Admitting: Pulmonary Disease

## 2022-02-21 ENCOUNTER — Ambulatory Visit (HOSPITAL_COMMUNITY): Payer: Medicare HMO | Attending: General Practice

## 2022-02-21 DIAGNOSIS — R0609 Other forms of dyspnea: Secondary | ICD-10-CM | POA: Diagnosis not present

## 2022-02-21 LAB — ECHOCARDIOGRAM COMPLETE
Area-P 1/2: 5.2 cm2
MV M vel: 5.21 m/s
MV Peak grad: 108.4 mmHg
P 1/2 time: 229 msec
Radius: 0.9 cm
S' Lateral: 5.2 cm

## 2022-02-21 NOTE — Telephone Encounter (Signed)
   Name: ORLEAN HOLTROP  DOB: [birth year]  MRN: [id number]  Primary Cardiologist: Quay Burow, MD  Chart reviewed as part of pre-operative protocol coverage. Because of Aleyza A Macdowell's past medical history and time since last visit, she will require a follow-up in-office visit in order to better assess preoperative cardiovascular risk.  Pt pending echo prior to clearance. Echo completed with newly reduced LVEF, pulmonary hypertension, and worsening valvular disease. Will need an in office appt to discuss echo results, preop clearance, and titration of GDMT.   Tami Lin Aleksia Freiman, PA  02/21/2022, 11:39 AM

## 2022-02-21 NOTE — Telephone Encounter (Signed)
Phone rep called Sheppard Evens, NP and relayed message from [place], [place].  Tiffany advised that the fax will come from the GI office, phone rep provided [place] office fax#

## 2022-02-21 NOTE — Telephone Encounter (Signed)
Left message to call back to schedule an appt in office to go over echo results and pre op clearance per the pre op app.

## 2022-02-22 ENCOUNTER — Telehealth: Payer: Self-pay | Admitting: Pulmonary Disease

## 2022-02-22 DIAGNOSIS — R590 Localized enlarged lymph nodes: Secondary | ICD-10-CM

## 2022-02-22 NOTE — Telephone Encounter (Signed)
I spoke with patient regarding her PET scan results. She has pet avid mediastinal adenopathy and I recommended we schedule her for EBUS. She is amenable to moving forward with this procedure. I spoke with Dr. Benson Norway of GI and we will plan to do joint EBUS and EGD on 1/12 if able.   Shannon Jackson, MD [facility] Office: [phone]   See Amion for personal pager PCCM on call pager [phone] until 7pm. Please call Elink 7p-7a. [phone]

## 2022-02-25 NOTE — H&P (View-Only) (Signed)
Office Visit    Patient Name: Shannon [name] Date of Encounter: 02/25/2022  Primary Care Provider:  Delford Field, FNP Primary Cardiologist:  Quay Burow, MD Primary Electrophysiologist: None  Chief Complaint    Shannon [name] is a 72 y.o. female with PMH of HFrEF, MVR, TVR, HTN, HLD, right brain TIA/stroke (Plavix), GERD, anxiety who presents today for surgical clearance of upcoming EGD.  Past Medical History    Past Medical History:  Diagnosis Date   Anxiety    Arthritis    "right knee" (05/26/2014)   Asthma    Chronic lower back pain    Dysrhythmia    irregular heart beat   GERD (gastroesophageal reflux disease)    HLD (hyperlipidemia)    Hypertension    Pneumonia    Stroke [facility])    TIA - ?2021   TIA (transient ischemic attack) 04/2018   Past Surgical History:  Procedure Laterality Date   BRONCHIAL BIOPSY  07/10/2021   Procedure: BRONCHIAL BIOPSIES;  Surgeon: Garner Nash, DO;  Location: [place] ENDOSCOPY;  Service: Pulmonary;;   BRONCHIAL BRUSHINGS  07/10/2021   Procedure: BRONCHIAL BRUSHINGS;  Surgeon: Garner Nash, DO;  Location: [place] ENDOSCOPY;  Service: Pulmonary;;   BRONCHIAL NEEDLE ASPIRATION BIOPSY  07/10/2021   Procedure: BRONCHIAL NEEDLE ASPIRATION BIOPSIES;  Surgeon: Garner Nash, DO;  Location: [place] ENDOSCOPY;  Service: Pulmonary;;   BRONCHIAL WASHINGS  07/10/2021   Procedure: BRONCHIAL WASHINGS;  Surgeon: Garner Nash, DO;  Location: [place] ENDOSCOPY;  Service: Pulmonary;;   ESOPHAGOGASTRODUODENOSCOPY N/A 05/27/2014   Procedure: ESOPHAGOGASTRODUODENOSCOPY (EGD);  Surgeon: Carol Ada, MD;  Location: [facility] ENDOSCOPY;  Service: Endoscopy;  Laterality: N/A;   FRACTURE SURGERY     PATELLA FRACTURE SURGERY Right ~ 2009   TONSILLECTOMY  ~ [place]  1980's   VIDEO BRONCHOSCOPY WITH RADIAL ENDOBRONCHIAL ULTRASOUND  07/10/2021   Procedure: RADIAL ENDOBRONCHIAL ULTRASOUND;  Surgeon: Garner Nash, DO;  Location: [place] ENDOSCOPY;   Service: Pulmonary;;    Allergies  Allergies  Allergen Reactions   Amoxicillin-Pot Clavulanate Itching   Other Other (See Comments)    08/06/2018 -    06/05/2016 -    09/15/2014 -    06/16/2014 -    History of Present Illness    Shannon [name]  is a 72year old female with the above mention past medical history who presents today for preoperative clearance.  [name] was initially seen by Dr.Berry in 2021 for evaluation of palpitations.  She wore 2-week ZIO monitor to rule out AF that revealed occasional PACs/PVCs with short runs of PSVT and NSVT.  She was admitted 04/2018 with complaint of left-sided arm weakness and found to have right-sided TIA with small vessel disease.  She was started on DAPT with ASA and Plavix.  2D echo was completed 08/2019 with normal LV function noted.  She underwent obstructive sleep apnea study that revealed no indication for CPAP therapy.  She had calcium scoring completed 04/21/2020 that revealed score of 507 with aortic atherosclerosis noted and patient was started on atorvastatin for secondary prevention.  She was last seen by Nicholes Rough, PA on 01/25/2022 for complaint of increased shortness of breath and tachycardia.  Carvedilol 3.125 mg twice daily was added and 2D echo was repeated to rule out structural causes of shortness of breath with EF of 40-45% mildly decreased LV function and global hypokinesis, severely dilated LA and mildly dilated RA with moderate to severe mitral valve regurg and  moderate to severe TV regurg, mild aortic valve regurg.  The ascending aorta is also mildly dilated at 36 mm  [name] presents today for preoperative clearance for EGD and EBUS and complaint of shortness of breath.  Since last being seen in the office patient reports that she has developed severe shortness of breath with exertion and has felt presyncope with ADLs.  She was sent for 2D echo that recently revealed decreased EF with severe MVR and TVR. She presented  originally for surgical clearance due to dysphagia and abnormal PE. She reports 8 to 10 pound weight loss due to dysphagia and trouble eating and swallowing. Patient denies chest pain, palpitations, dyspnea, PND, orthopnea, nausea, vomiting, dizziness, syncope, edema, weight gain, or early satiety.  Home Medications    Current Outpatient Medications  Medication Sig Dispense Refill   albuterol (PROVENTIL HFA;VENTOLIN HFA) 108 (90 BASE) MCG/ACT inhaler Inhale 2 puffs into the lungs every 2 (two) hours as needed for wheezing or shortness of breath (cough). 1 Inhaler 0   albuterol (PROVENTIL) (2.5 MG/3ML) 0.083% nebulizer solution Take 3 mLs (2.5 mg total) by nebulization every 6 (six) hours as needed for wheezing or shortness of breath. 75 mL 12   Aspirin-Acetaminophen-Caffeine (GOODY HEADACHE PO) Take 2 packets by mouth daily as needed (pain).     atorvastatin (LIPITOR) 80 MG tablet Take 1 tablet (80 mg total) by mouth daily at 6 PM. 30 tablet 2   busPIRone (BUSPAR) 10 MG tablet Take 10 mg by mouth 2 (two) times daily.     Cholecalciferol (VITAMIN D) 50 MCG (2000 UT) tablet Take 2,000 Units by mouth daily.     clopidogrel (PLAVIX) 75 MG tablet Take 1 tablet (75 mg total) by mouth daily. 30 tablet 0   desloratadine (CLARINEX) 5 MG tablet Take 5 mg by mouth daily.     ezetimibe (ZETIA) 10 MG tablet Take 10 mg by mouth daily.     FLUoxetine (PROZAC) 20 MG capsule Take 20 mg by mouth daily.     fluticasone (FLONASE) 50 MCG/ACT nasal spray Place 1 spray into both nostrils daily.     fluticasone-salmeterol (WIXELA INHUB) 500-50 MCG/ACT AEPB Inhale 1 puff into the lungs in the morning and at bedtime. 60 each 6   meloxicam (MOBIC) 7.5 MG tablet Take 7.5 mg by mouth daily.     montelukast (SINGULAIR) 10 MG tablet Take 1 tablet (10 mg total) by mouth at bedtime. 30 tablet 11   pantoprazole (PROTONIX) 40 MG tablet TAKE 1 TABLET BY MOUTH EVERY DAY 90 tablet 1   valsartan (DIOVAN) 80 MG tablet Take 80 mg by  mouth daily.     No current facility-administered medications for this visit.     Review of Systems  Please see the history of present illness.    (+) Shortness of breath (+) Fatigue, presyncope  All other systems reviewed and are otherwise negative except as noted above.  Physical Exam    Wt Readings from Last 3 Encounters:  02/19/22 142 lb 9.6 oz (64.7 kg)  01/25/22 151 lb (68.5 kg)  01/23/22 147 lb 6.4 oz (66.9 kg)   LK:GMWNU were no vitals filed for this visit.,There is no height or weight on file to calculate BMI.  Constitutional:      Appearance: Healthy appearance. Not in distress.  Neck:     Vascular: JVD normal.  Pulmonary:     Effort: Pulmonary effort is normal.     Breath sounds: No wheezing. No rales. Diminished in  the bases Cardiovascular:     Normal rate. Regular rhythm. Normal S1. Normal S2.      Murmurs: There is no murmur.  Edema:    Peripheral edema absent.  Abdominal:     Palpations: Abdomen is soft non tender. There is no hepatomegaly.  Skin:    General: Skin is warm and dry.  Neurological:     General: No focal deficit present.     Mental Status: Alert and oriented to person, place and time.     Cranial Nerves: Cranial nerves are intact.  EKG/LABS/Other Studies Reviewed    ECG personally reviewed by me today -sinus tach with nonspecific T wave abnormality and rate of 109 bpm with no acute changes consistent with previous EKG.    Lab Results  Component Value Date   WBC 5.4 01/07/2022   HGB 12.3 01/07/2022   HCT 37.0 01/07/2022   MCV 89.7 01/07/2022   PLT 233.0 01/07/2022   Lab Results  Component Value Date   CREATININE 1.01 01/07/2022   BUN 11 01/07/2022   NA 142 01/07/2022   K 3.9 01/07/2022   CL 108 01/07/2022   CO2 21 01/07/2022   Lab Results  Component Value Date   ALT 21 01/07/2022   AST 34 01/07/2022   ALKPHOS 88 01/07/2022   BILITOT 1.7 (H) 01/07/2022   Lab Results  Component Value Date   CHOL 193 06/17/2019   HDL 71  06/17/2019   LDLCALC 109 (H) 06/17/2019   TRIG 71 06/17/2019   CHOLHDL 2.7 06/17/2019    Lab Results  Component Value Date   HGBA1C 5.8 (H) 06/17/2019    Assessment & Plan    1.  Preoperative surgical clearance: -Unable to clear patient for upcoming EGD and EBUS procedure due to abnormal echo results showing severe mitral regurgitation and tricuspid valve regurgitation.  She presents today with tachycardia and complaints of presyncope with severe shortness of breath.  She is currently chest pain-free.  She is unable to achieve 4 METS of activity and therefore we will defer surgical clearance for now with plan to proceed if [facility] reveals normal coronaries and filling pressures.  2.  HFrEF/Severe MVR: -2D echo completed with reduced EF of 40-45%, mildly decreased LV function with global hypokinesis with severely elevated pulmonary artery systolic pressure and severely dilated LA and mildly dilated RA -Today patient is euvolemic however has complaints of severe shortness of breath and exercise intolerance.  -We will have her take Lasix 20 mg daily x 3 days and then as needed. -Start Toprol-XL 12.5 mg daily -Patient will have ischemic evaluation by [facility] to evaluate for possible ischemic causes of newly decreased EF. -We will also order TEE to evaluate for severe MR and TV -CMET, BNP, CBC today  3.  Dyspnea on exertion -We will proceed with treatment plan as noted above with [place] [facility] and TEE procedure. -We will start Lasix as noted above   4.  History of TIA: -Right-sided TIA in 2020 currently on Plavix with no recurrence. -Continue Plavix and ASA 81 mg  5.  Nonobstructive coronary artery disease: -s/p coronary calcium scoring with calcium score of 507 with aortic atherosclerosis noted and PET/CT with coronary calcifications noted. -Continue GDMT with Plavix, atorvastatin and ASA 81 mg -Patient will have R/LHC completed for further restratification and decreased EF.  Disposition:  Follow-up with Nanetta Batty, MD or APP in 1 months Shared Decision Making/Informed Consent The risks [stroke (1 in 1000), death (1 in 1000), kidney failure [usually temporary] (  1 in 500), bleeding (1 in 200), allergic reaction [possibly serious] (1 in 200)], benefits (diagnostic support and management of coronary artery disease) and alternatives of a cardiac catheterization were discussed in detail with Ms. Huberty and she is willing to proceed.   The risks [esophageal damage, perforation (1:10,000 risk), bleeding, pharyngeal hematoma as well as other potential complications associated with conscious sedation including aspiration, arrhythmia, respiratory failure and death], benefits (treatment guidance and diagnostic support) and alternatives of a transesophageal echocardiogram were discussed in detail with Ms. Crumm and she is willing to proceed.    Medication Adjustments/Labs and Tests Ordered: Current medicines are reviewed at length with the patient today.  Concerns regarding medicines are outlined above.   Signed, Napoleon Form, Leodis Rains, NP 02/25/2022, 1:33 PM [facility]  Note:  This document was prepared using Dragon voice recognition software and may include unintentional dictation errors.

## 2022-02-25 NOTE — Telephone Encounter (Signed)
I s/w the pt today and she is agreeable to appt in office for pre op and to go over echo results, see previous notes. Pt tells me that her procedure has been moved to 03/01/22. I informed the pt that no one contacted our office to let us know know of the new date. She is not cleared until she has been seen by cardiology. Pt agreeable to appt 02/26/22 @ 2:45 with Ambrose Pancoast, NP at [facility]. I assured the pt that I will update Dr. Ulyses Amor office as well. Once the pt has been cleared we will be sure to fax clearance notes. Pt thanked me for the help.

## 2022-02-25 NOTE — Progress Notes (Unsigned)
Office Visit    Patient Name: Shannon [name] Date of Encounter: 02/25/2022  Primary Care Provider:  Delford Field, FNP Primary Cardiologist:  Quay Burow, MD Primary Electrophysiologist: None  Chief Complaint    Shannon [name] is a 72 y.o. female with PMH of HFrEF, MVR, TVR, HTN, HLD, right brain TIA/stroke (Plavix), GERD, anxiety who presents today for surgical clearance of upcoming EGD.  Past Medical History    Past Medical History:  Diagnosis Date   Anxiety    Arthritis    "right knee" (05/26/2014)   Asthma    Chronic lower back pain    Dysrhythmia    irregular heart beat   GERD (gastroesophageal reflux disease)    HLD (hyperlipidemia)    Hypertension    Pneumonia    Stroke [facility])    TIA - ?2021   TIA (transient ischemic attack) 04/2018   Past Surgical History:  Procedure Laterality Date   BRONCHIAL BIOPSY  07/10/2021   Procedure: BRONCHIAL BIOPSIES;  Surgeon: Garner Nash, DO;  Location: [place] ENDOSCOPY;  Service: Pulmonary;;   BRONCHIAL BRUSHINGS  07/10/2021   Procedure: BRONCHIAL BRUSHINGS;  Surgeon: Garner Nash, DO;  Location: [place] [facility]COPY;  Service: Pulmonary;;   BRONCHIAL NEEDLE ASPIRATION BIOPSY  07/10/2021   Procedure: BRONCHIAL NEEDLE ASPIRATION BIOPSIES;  Surgeon: Garner Nash, DO;  Location: [place] [facility];  Service: Pulmonary;;   BRONCHIAL WASHINGS  07/10/2021   Procedure: BRONCHIAL WASHINGS;  Surgeon: Garner Nash, DO;  Location: [place] ENDOSCOPY;  Service: Pulmonary;;   ESOPHAGOGASTRODUODENOSCOPY N/A 05/27/2014   Procedure: ESOPHAGOGASTRODUODENOSCOPY (EGD);  Surgeon: Carol Ada, MD;  Location: [facility] ENDOSCOPY;  Service: Endoscopy;  Laterality: N/A;   FRACTURE SURGERY     PATELLA FRACTURE SURGERY Right ~ 2009   TONSILLECTOMY  ~ [place]  1980's   VIDEO BRONCHOSCOPY WITH RADIAL ENDOBRONCHIAL ULTRASOUND  07/10/2021   Procedure: RADIAL ENDOBRONCHIAL ULTRASOUND;  Surgeon: Garner Nash, DO;  Location: [place] ENDOSCOPY;   Service: Pulmonary;;    Allergies  Allergies  Allergen Reactions   Amoxicillin-Pot Clavulanate Itching   Other Other (See Comments)    08/06/2018 -    06/05/2016 -    09/15/2014 -    06/16/2014 -    History of Present Illness    Shannon [name]  is a 72year old female with the above mention past medical history who presents today for preoperative clearance.  [name] was initially seen by Dr.Berry in 2021 for evaluation of palpitations.  She wore 2-week ZIO monitor to rule out AF that revealed occasional PACs/PVCs with short runs of PSVT and NSVT.  She was admitted 04/2018 with complaint of left-sided arm weakness and found to have right-sided TIA with small vessel disease.  She was started on DAPT with ASA and Plavix.  2D echo was completed 08/2019 with normal LV function noted.  She underwent obstructive sleep apnea study that revealed no indication for CPAP therapy.  She had calcium scoring completed 04/21/2020 that revealed score of 507 with aortic atherosclerosis noted and patient was started on atorvastatin for secondary prevention.  She was last seen by Nicholes Rough, PA on 01/25/2022 for complaint of increased shortness of breath and tachycardia.  Carvedilol 3.125 mg twice daily was added and 2D echo was repeated to rule out structural causes of shortness of breath with EF of 40-45% mildly decreased LV function and global hypokinesis, severely dilated LA and mildly dilated RA with moderate to severe mitral valve regurg and  moderate to severe TV regurg, mild aortic valve regurg.  The ascending aorta is also mildly dilated at 36 mm  Since last being seen in the office patient reports***.  Patient denies chest pain, palpitations, dyspnea, PND, orthopnea, nausea, vomiting, dizziness, syncope, edema, weight gain, or early satiety.     ***Notes: -Patient has reduced EF and MR/TR -Surgical clearance for EGD scheduled 03/01/2022 Home Medications    Current Outpatient Medications   Medication Sig Dispense Refill   albuterol (PROVENTIL HFA;VENTOLIN HFA) 108 (90 BASE) MCG/ACT inhaler Inhale 2 puffs into the lungs every 2 (two) hours as needed for wheezing or shortness of breath (cough). 1 Inhaler 0   albuterol (PROVENTIL) (2.5 MG/3ML) 0.083% nebulizer solution Take 3 mLs (2.5 mg total) by nebulization every 6 (six) hours as needed for wheezing or shortness of breath. 75 mL 12   Aspirin-Acetaminophen-Caffeine (GOODY HEADACHE PO) Take 2 packets by mouth daily as needed (pain).     atorvastatin (LIPITOR) 80 MG tablet Take 1 tablet (80 mg total) by mouth daily at 6 PM. 30 tablet 2   busPIRone (BUSPAR) 10 MG tablet Take 10 mg by mouth 2 (two) times daily.     Cholecalciferol (VITAMIN D) 50 MCG (2000 UT) tablet Take 2,000 Units by mouth daily.     clopidogrel (PLAVIX) 75 MG tablet Take 1 tablet (75 mg total) by mouth daily. 30 tablet 0   desloratadine (CLARINEX) 5 MG tablet Take 5 mg by mouth daily.     ezetimibe (ZETIA) 10 MG tablet Take 10 mg by mouth daily.     FLUoxetine (PROZAC) 20 MG capsule Take 20 mg by mouth daily.     fluticasone (FLONASE) 50 MCG/ACT nasal spray Place 1 spray into both nostrils daily.     fluticasone-salmeterol (WIXELA INHUB) 500-50 MCG/ACT AEPB Inhale 1 puff into the lungs in the morning and at bedtime. 60 each 6   meloxicam (MOBIC) 7.5 MG tablet Take 7.5 mg by mouth daily.     montelukast (SINGULAIR) 10 MG tablet Take 1 tablet (10 mg total) by mouth at bedtime. 30 tablet 11   pantoprazole (PROTONIX) 40 MG tablet TAKE 1 TABLET BY MOUTH EVERY DAY 90 tablet 1   valsartan (DIOVAN) 80 MG tablet Take 80 mg by mouth daily.     No current facility-administered medications for this visit.     Review of Systems  Please see the history of present illness.    (+)*** (+)***  All other systems reviewed and are otherwise negative except as noted above.  Physical Exam    Wt Readings from Last 3 Encounters:  02/19/22 142 lb 9.6 oz (64.7 kg)  01/25/22 151  lb (68.5 kg)  01/23/22 147 lb 6.4 oz (66.9 kg)   CV:ELFYB were no vitals filed for this visit.,There is no height or weight on file to calculate BMI.  Constitutional:      Appearance: Healthy appearance. Not in distress.  Neck:     Vascular: JVD normal.  Pulmonary:     Effort: Pulmonary effort is normal.     Breath sounds: No wheezing. No rales. Diminished in the bases Cardiovascular:     Normal rate. Regular rhythm. Normal S1. Normal S2.      Murmurs: There is no murmur.  Edema:    Peripheral edema absent.  Abdominal:     Palpations: Abdomen is soft non tender. There is no hepatomegaly.  Skin:    General: Skin is warm and dry.  Neurological:     General:  No focal deficit present.     Mental Status: Alert and oriented to person, place and time.     Cranial Nerves: Cranial nerves are intact.  EKG/LABS/Other Studies Reviewed    ECG personally reviewed by me today - ***  Risk Assessment/Calculations:   {Does this patient have ATRIAL FIBRILLATION?:[phone]}        Lab Results  Component Value Date   WBC 5.4 01/07/2022   HGB 12.3 01/07/2022   HCT 37.0 01/07/2022   MCV 89.7 01/07/2022   PLT 233.0 01/07/2022   Lab Results  Component Value Date   CREATININE 1.01 01/07/2022   BUN 11 01/07/2022   NA 142 01/07/2022   K 3.9 01/07/2022   CL 108 01/07/2022   CO2 21 01/07/2022   Lab Results  Component Value Date   ALT 21 01/07/2022   AST 34 01/07/2022   ALKPHOS 88 01/07/2022   BILITOT 1.7 (H) 01/07/2022   Lab Results  Component Value Date   CHOL 193 06/17/2019   HDL 71 06/17/2019   LDLCALC 109 (H) 06/17/2019   TRIG 71 06/17/2019   CHOLHDL 2.7 06/17/2019    Lab Results  Component Value Date   HGBA1C 5.8 (H) 06/17/2019    Assessment & Plan    1.  Preoperative surgical clearance:   2.  HFrEF: -2D echo completed with reduced EF of 40-45%, mildly decreased LV function with global hypokinesis with severely elevated pulmonary artery systolic pressure and  severely dilated LA and mildly dilated RA    3.  Mitral valve regurgitation: -2D echo completed recently with decreased EF and   4.  History of TIA: -Right-sided TIA in 2020 currently on Plavix with no recurrence.  5.  Nonobstructive coronary artery disease: -s/p coronary calcium scoring with calcium score of 507 with aortic atherosclerosis noted  -Continue GDMT***      Disposition: Follow-up with Nanetta Batty, MD or APP in *** months {Are you ordering a CV Procedure (e.g. stress test, cath, DCCV, TEE, etc)?   Press F2        :[id number]}   Medication Adjustments/Labs and Tests Ordered: Current medicines are reviewed at length with the patient today.  Concerns regarding medicines are outlined above.   Signed, Napoleon Form, Leodis Rains, NP 02/25/2022, 1:33 PM [facility]  Note:  This document was prepared using Dragon voice recognition software and may include unintentional dictation errors.

## 2022-02-26 ENCOUNTER — Other Ambulatory Visit: Payer: Self-pay

## 2022-02-26 ENCOUNTER — Other Ambulatory Visit: Payer: Medicare HMO

## 2022-02-26 ENCOUNTER — Encounter: Payer: Self-pay | Admitting: Nurse Practitioner

## 2022-02-26 ENCOUNTER — Ambulatory Visit: Payer: Medicare HMO | Attending: Nurse Practitioner | Admitting: Nurse Practitioner

## 2022-02-26 VITALS — BP 110/74 | HR 109 | Ht 66.0 in | Wt 144.2 lb

## 2022-02-26 DIAGNOSIS — I2510 Atherosclerotic heart disease of native coronary artery without angina pectoris: Secondary | ICD-10-CM

## 2022-02-26 DIAGNOSIS — Z01810 Encounter for preprocedural cardiovascular examination: Secondary | ICD-10-CM | POA: Diagnosis not present

## 2022-02-26 DIAGNOSIS — Z1152 Encounter for screening for COVID-19: Secondary | ICD-10-CM

## 2022-02-26 DIAGNOSIS — I2584 Coronary atherosclerosis due to calcified coronary lesion: Secondary | ICD-10-CM

## 2022-02-26 DIAGNOSIS — G459 Transient cerebral ischemic attack, unspecified: Secondary | ICD-10-CM

## 2022-02-26 DIAGNOSIS — I340 Nonrheumatic mitral (valve) insufficiency: Secondary | ICD-10-CM | POA: Diagnosis not present

## 2022-02-26 DIAGNOSIS — I5020 Unspecified systolic (congestive) heart failure: Secondary | ICD-10-CM

## 2022-02-26 MED ORDER — FUROSEMIDE 20 MG PO TABS: 20.0000 mg | ORAL_TABLET | ORAL | 0 refills | Status: DC

## 2022-02-26 MED ORDER — SODIUM CHLORIDE 0.9% FLUSH: 3.0000 mL | Freq: Two times a day (BID) | INTRAVENOUS | Status: DC

## 2022-02-26 MED ORDER — METOPROLOL SUCCINATE ER 25 MG PO TB24: 12.5000 mg | ORAL_TABLET | Freq: Every day | ORAL | 0 refills | Status: DC

## 2022-02-26 NOTE — Addendum Note (Signed)
Addended by: Tempie Donning on: 02/26/2022 06:44 PM   Modules accepted: Orders

## 2022-02-26 NOTE — Patient Instructions (Addendum)
Medication Instructions:  START Metoprolol Succinate 12.5mg  Take 1 tablet once a day  START Lasix 20mg  Take 1 tablet once a day for 3 days then take on a as needed basis. CHECK YOUR WEIGHT DAILY  *If you need a refill on your cardiac medications before your next appointment, please call your pharmacy*   Lab Work: TODAY-CBC, CMET, AND BNP CBC AND BMET 1 WEEK BEFORE 03/13/2022 If you have labs (blood work) drawn today and your tests are completely normal, you will receive your results only by: [place] (if you have MyChart) OR A paper copy in the mail If you have any lab test that is abnormal or we need to change your treatment, we will call you to review the results.   Testing/Procedures: Your physician has requested that you have a cardiac catheterization. Cardiac catheterization is used to diagnose and/or treat various heart conditions. Doctors may recommend this procedure for a number of different reasons. The most common reason is to evaluate chest pain. Chest pain can be a symptom of coronary artery disease (CAD), and cardiac catheterization can show whether plaque is narrowing or blocking your heart's arteries. This procedure is also used to evaluate the valves, as well as measure the blood flow and oxygen levels in different parts of your heart. For further information please visit [email or web address]. Please follow instruction sheet, as given.  Your physician has requested that you have a TEE. During a TEE, sound waves are used to create images of your heart. It provides your doctor with information about the size and shape of your heart and how well your heart's chambers and valves are working. In this test, a transducer is attached to the end of a flexible tube that's guided down your throat and into your esophagus (the tube leading from you mouth to your stomach) to get a more detailed image of your heart. You are not awake for the procedure. Please see the instruction sheet given  to you today. For further information please visit [email or web address].   Follow-Up: At [facility], you and your health needs are our priority.  As part of our continuing mission to provide you with exceptional heart care, we have created designated Provider Care Teams.  These Care Teams include your primary Cardiologist (physician) and Advanced Practice Providers (APPs -  Physician Assistants and Nurse Practitioners) who all work together to provide you with the care you need, when you need it.  We recommend signing up for the patient portal called "MyChart".  Sign up information is provided on this After Visit Summary.  MyChart is used to connect with patients for Virtual Visits (Telemedicine).  Patients are able to view lab/test results, encounter notes, upcoming appointments, etc.  Non-urgent messages can be sent to your provider as well.   To learn more about what you can do with MyChart, go to [email or web address].    Your next appointment:   1 month(s)  The format for your next appointment:   In Person  Provider:   Ambrose Pancoast, NP       Other Instructions       Cardiac Catheterization   You are scheduled for a Cardiac Catheterization on Thursday, January 11 with Dr. Larae Grooms.  1. Please arrive at the Main Entrance A at [facility]: [address] [place], [place] [address] on January 11 at 7:00 AM (This time is two hours before your procedure to ensure your preparation). Free valet parking service is available. You will check in  at ADMITTING. The support person will be asked to wait in the waiting room.  It is OK to have someone drop you off and come back when you are ready to be discharged.        Special note: Every effort is made to have your procedure done on time. Please understand that emergencies sometimes delay scheduled procedures.  2. Diet: Do not eat solid foods after midnight.  You may have clear liquids until 5 AM the day of the  procedure.  3. Labs: You will need to have blood drawn on Tuesday, January 9 at [facility] at [facility]. [address], [place]  Open: 7:30am - 5pm    Phone: [phone]. You do not need to be fasting.  4. Medication instructions in preparation for your procedure:   Contrast Allergy: No  On the morning of your procedure, take Aspirin 81 mg and any morning medicines NOT listed above.  You may use sips of water.  5. Plan to go home the same day, you will only stay overnight if medically necessary. 6. You MUST have a responsible adult to drive you home. 7. An adult MUST be with you the first 24 hours after you arrive home. 8. Bring a current list of your medications, and the last time and date medication taken. 9. Bring ID and current insurance cards. 10.Please wear clothes that are easy to get on and off and wear slip-on shoes.  Thank you for allowing [place] to care for you!   -- [facility]       Dear Shannon [name]   You are scheduled for a TEE (Transesophageal Echocardiogram) on Wednesday, January 24 with Dr. Bjorn Pippin.  Please arrive at the [facility] (Main Entrance A) at [facility]: [address] [place], [place] [address] at 7:30 AM.    DIET:  Nothing to eat or drink after midnight except a sip of water with medications (see medication instructions below)  MEDICATION INSTRUCTIONS:  LABS: Come to the lab at [facility] at [address] between the hours of 8:00 am and 4:30 pm. You do NOT have to be fasting. CBC AND BMET 1 WEEK BEFORE 03/13/2022  FYI:  For your safety, and to allow [place] to monitor your vital signs accurately during the surgery/procedure we request: If you have artificial nails, gel coating, SNS etc, please have those removed prior to your surgery/procedure. Not having the nail coverings /polish removed may result in cancellation or delay of your surgery/procedure.  You  must have a responsible person to drive you home and stay in the waiting area during your procedure. Failure to do so could result in cancellation.  Bring your insurance cards.  *Special Note: Every effort is made to have your procedure done on time. Occasionally there are emergencies that occur at the hospital that may cause delays. Please be patient if a delay does occur.     Important Information About Sugar

## 2022-02-27 ENCOUNTER — Other Ambulatory Visit: Payer: Self-pay | Admitting: Nurse Practitioner

## 2022-02-27 ENCOUNTER — Telehealth: Payer: Self-pay | Admitting: *Deleted

## 2022-02-27 ENCOUNTER — Encounter (HOSPITAL_COMMUNITY): Payer: Self-pay | Admitting: Gastroenterology

## 2022-02-27 LAB — COMPREHENSIVE METABOLIC PANEL
ALT: 25 IU/L (ref 0–32)
AST: 31 IU/L (ref 0–40)
Albumin/Globulin Ratio: 1.8 (ref 1.2–2.2)
Albumin: 4.4 g/dL (ref 3.8–4.8)
Alkaline Phosphatase: 123 IU/L — ABNORMAL HIGH (ref 44–121)
BUN/Creatinine Ratio: 11 — ABNORMAL LOW (ref 12–28)
BUN: 12 mg/dL (ref 8–27)
Bilirubin Total: 1.6 mg/dL — ABNORMAL HIGH (ref 0.0–1.2)
CO2: 19 mmol/L — ABNORMAL LOW (ref 20–29)
Calcium: 9.4 mg/dL (ref 8.7–10.3)
Chloride: 108 mmol/L — ABNORMAL HIGH (ref 96–106)
Creatinine, Ser: 1.14 mg/dL — ABNORMAL HIGH (ref 0.57–1.00)
Globulin, Total: 2.4 g/dL (ref 1.5–4.5)
Glucose: 118 mg/dL — ABNORMAL HIGH (ref 70–99)
Potassium: 4.2 mmol/L (ref 3.5–5.2)
Sodium: 143 mmol/L (ref 134–144)
Total Protein: 6.8 g/dL (ref 6.0–8.5)
eGFR: 51 mL/min/{1.73_m2} — ABNORMAL LOW (ref >59)

## 2022-02-27 LAB — CBC
Hematocrit: 36.8 % (ref 34.0–46.6)
Hemoglobin: 12.3 g/dL (ref 11.1–15.9)
MCH: 31 pg (ref 26.6–33.0)
MCHC: 33.4 g/dL (ref 31.5–35.7)
MCV: 93 fL (ref 79–97)
Platelets: 277 10*3/uL (ref 150–450)
RBC: 3.97 x10E6/uL (ref 3.77–5.28)
RDW: 14 % (ref 11.7–15.4)
WBC: 3.9 10*3/uL (ref 3.4–10.8)

## 2022-02-27 LAB — PRO B NATRIURETIC PEPTIDE: NT-Pro BNP: 2865 pg/mL — ABNORMAL HIGH (ref 0–301)

## 2022-02-27 NOTE — Telephone Encounter (Signed)
Cardiac Catheterization scheduled at [facility] for: Thursday February 28, 2022 9 AM Arrival time and place: [place] Entrance A at: 7 AM   Nothing to eat after midnight prior to procedure, clear liquids until 5 AM day of procedure.  Medication instructions: -Hold:  Losartan/Lasix/Mobic-day before and day of procedure -per protocol GFR 51 -Except hold medications usual morning medications can be taken with sips of water including aspirin 81 mg and Plavix 75 mg  Confirmed patient has responsible adult to drive home post procedure and be with patient first 24 hours after arriving home.  Patient reports no new symptoms concerning for COVID-19 in the past 10 days.  Reviewed procedure instructions with patient.

## 2022-02-28 ENCOUNTER — Other Ambulatory Visit: Payer: Self-pay

## 2022-02-28 ENCOUNTER — Encounter (HOSPITAL_COMMUNITY)
Admission: RE | Disposition: A | Discharge status: Home / Self Care | Payer: Self-pay | Source: Ambulatory Visit | Attending: Cardiology

## 2022-02-28 ENCOUNTER — Inpatient Hospital Stay (HOSPITAL_COMMUNITY)
Admission: RE | Admit: 2022-02-28 | Discharge: 2022-03-04 | DRG: 286 | Disposition: A | Discharge status: Home / Self Care | Payer: Medicare HMO | Source: Ambulatory Visit | Attending: Cardiology | Admitting: Cardiology

## 2022-02-28 DIAGNOSIS — I340 Nonrheumatic mitral (valve) insufficiency: Secondary | ICD-10-CM | POA: Diagnosis present

## 2022-02-28 DIAGNOSIS — I5043 Acute on chronic combined systolic (congestive) and diastolic (congestive) heart failure: Secondary | ICD-10-CM | POA: Diagnosis present

## 2022-02-28 DIAGNOSIS — N179 Acute kidney failure, unspecified: Secondary | ICD-10-CM | POA: Diagnosis not present

## 2022-02-28 DIAGNOSIS — M545 Low back pain, unspecified: Secondary | ICD-10-CM | POA: Diagnosis present

## 2022-02-28 DIAGNOSIS — Z7902 Long term (current) use of antithrombotics/antiplatelets: Secondary | ICD-10-CM

## 2022-02-28 DIAGNOSIS — M1711 Unilateral primary osteoarthritis, right knee: Secondary | ICD-10-CM | POA: Diagnosis present

## 2022-02-28 DIAGNOSIS — I083 Combined rheumatic disorders of mitral, aortic and tricuspid valves: Secondary | ICD-10-CM | POA: Diagnosis present

## 2022-02-28 DIAGNOSIS — Z880 Allergy status to penicillin: Secondary | ICD-10-CM

## 2022-02-28 DIAGNOSIS — Z7951 Long term (current) use of inhaled steroids: Secondary | ICD-10-CM | POA: Diagnosis not present

## 2022-02-28 DIAGNOSIS — I110 Hypertensive heart disease with heart failure: Principal | ICD-10-CM | POA: Diagnosis present

## 2022-02-28 DIAGNOSIS — Z79899 Other long term (current) drug therapy: Secondary | ICD-10-CM

## 2022-02-28 DIAGNOSIS — Z8673 Personal history of transient ischemic attack (TIA), and cerebral infarction without residual deficits: Secondary | ICD-10-CM | POA: Diagnosis not present

## 2022-02-28 DIAGNOSIS — I2722 Pulmonary hypertension due to left heart disease: Secondary | ICD-10-CM | POA: Diagnosis present

## 2022-02-28 DIAGNOSIS — E785 Hyperlipidemia, unspecified: Secondary | ICD-10-CM | POA: Diagnosis present

## 2022-02-28 DIAGNOSIS — B3781 Candidal esophagitis: Secondary | ICD-10-CM | POA: Diagnosis not present

## 2022-02-28 DIAGNOSIS — K219 Gastro-esophageal reflux disease without esophagitis: Secondary | ICD-10-CM | POA: Diagnosis present

## 2022-02-28 DIAGNOSIS — Z791 Long term (current) use of non-steroidal anti-inflammatories (NSAID): Secondary | ICD-10-CM

## 2022-02-28 DIAGNOSIS — I471 Supraventricular tachycardia, unspecified: Secondary | ICD-10-CM | POA: Diagnosis not present

## 2022-02-28 DIAGNOSIS — G8929 Other chronic pain: Secondary | ICD-10-CM | POA: Diagnosis present

## 2022-02-28 DIAGNOSIS — Z800 Family history of malignant neoplasm of digestive organs: Secondary | ICD-10-CM

## 2022-02-28 DIAGNOSIS — J4489 Other specified chronic obstructive pulmonary disease: Secondary | ICD-10-CM | POA: Diagnosis present

## 2022-02-28 DIAGNOSIS — K229 Disease of esophagus, unspecified: Secondary | ICD-10-CM | POA: Diagnosis not present

## 2022-02-28 DIAGNOSIS — Z86010 Personal history of colonic polyps: Secondary | ICD-10-CM | POA: Diagnosis not present

## 2022-02-28 DIAGNOSIS — J439 Emphysema, unspecified: Secondary | ICD-10-CM | POA: Diagnosis present

## 2022-02-28 DIAGNOSIS — I080 Rheumatic disorders of both mitral and aortic valves: Secondary | ICD-10-CM | POA: Diagnosis not present

## 2022-02-28 DIAGNOSIS — Z8249 Family history of ischemic heart disease and other diseases of the circulatory system: Secondary | ICD-10-CM | POA: Diagnosis not present

## 2022-02-28 DIAGNOSIS — Z87891 Personal history of nicotine dependence: Secondary | ICD-10-CM

## 2022-02-28 DIAGNOSIS — I428 Other cardiomyopathies: Secondary | ICD-10-CM | POA: Diagnosis present

## 2022-02-28 DIAGNOSIS — I429 Cardiomyopathy, unspecified: Secondary | ICD-10-CM | POA: Diagnosis not present

## 2022-02-28 DIAGNOSIS — J449 Chronic obstructive pulmonary disease, unspecified: Secondary | ICD-10-CM | POA: Diagnosis not present

## 2022-02-28 DIAGNOSIS — I5021 Acute systolic (congestive) heart failure: Secondary | ICD-10-CM | POA: Diagnosis not present

## 2022-02-28 DIAGNOSIS — I509 Heart failure, unspecified: Secondary | ICD-10-CM | POA: Diagnosis not present

## 2022-02-28 HISTORY — PX: RIGHT/LEFT HEART CATH AND CORONARY ANGIOGRAPHY: CATH118266

## 2022-02-28 LAB — CBC
HCT: 37 % (ref 36.0–46.0)
Hemoglobin: 12.2 g/dL (ref 12.0–15.0)
MCH: 30.1 pg (ref 26.0–34.0)
MCHC: 33 g/dL (ref 30.0–36.0)
MCV: 91.4 fL (ref 80.0–100.0)
Platelets: 305 10*3/uL (ref 150–400)
RBC: 4.05 MIL/uL (ref 3.87–5.11)
RDW: 16.4 % — ABNORMAL HIGH (ref 11.5–15.5)
WBC: 4 10*3/uL (ref 4.0–10.5)
nRBC: 0 % (ref 0.0–0.2)

## 2022-02-28 LAB — POCT I-STAT EG7
Acid-base deficit: 4 mmol/L — ABNORMAL HIGH (ref 0.0–2.0)
Acid-base deficit: 4 mmol/L — ABNORMAL HIGH (ref 0.0–2.0)
Bicarbonate: 21 mmol/L (ref 20.0–28.0)
Bicarbonate: 21.5 mmol/L (ref 20.0–28.0)
Calcium, Ion: 1.19 mmol/L (ref 1.15–1.40)
Calcium, Ion: 1.19 mmol/L (ref 1.15–1.40)
HCT: 35 % — ABNORMAL LOW (ref 36.0–46.0)
HCT: 36 % (ref 36.0–46.0)
Hemoglobin: 11.9 g/dL — ABNORMAL LOW (ref 12.0–15.0)
Hemoglobin: 12.2 g/dL (ref 12.0–15.0)
O2 Saturation: 48 %
O2 Saturation: 48 %
Potassium: 3.9 mmol/L (ref 3.5–5.1)
Potassium: 3.9 mmol/L (ref 3.5–5.1)
Sodium: 141 mmol/L (ref 135–145)
Sodium: 141 mmol/L (ref 135–145)
TCO2: 22 mmol/L (ref 22–32)
TCO2: 23 mmol/L (ref 22–32)
pCO2, Ven: 38.4 mmHg — ABNORMAL LOW (ref 44–60)
pCO2, Ven: 39.6 mmHg — ABNORMAL LOW (ref 44–60)
pH, Ven: 7.343 (ref 7.25–7.43)
pH, Ven: 7.346 (ref 7.25–7.43)
pO2, Ven: 27 mmHg — CL (ref 32–45)
pO2, Ven: 27 mmHg — CL (ref 32–45)

## 2022-02-28 LAB — CREATININE, SERUM
Creatinine, Ser: 1.33 mg/dL — ABNORMAL HIGH (ref 0.44–1.00)
GFR, Estimated: 43 mL/min — ABNORMAL LOW (ref >60)

## 2022-02-28 LAB — POCT I-STAT 7, (LYTES, BLD GAS, ICA,H+H)
Acid-base deficit: 6 mmol/L — ABNORMAL HIGH (ref 0.0–2.0)
Acid-base deficit: 5 mmol/L — ABNORMAL HIGH (ref 0.0–2.0)
Bicarbonate: 18.7 mmol/L — ABNORMAL LOW (ref 20.0–28.0)
Bicarbonate: 19.5 mmol/L — ABNORMAL LOW (ref 20.0–28.0)
Calcium, Ion: 1.14 mmol/L — ABNORMAL LOW (ref 1.15–1.40)
Calcium, Ion: 1.18 mmol/L (ref 1.15–1.40)
HCT: 35 % — ABNORMAL LOW (ref 36.0–46.0)
HCT: 35 % — ABNORMAL LOW (ref 36.0–46.0)
Hemoglobin: 11.9 g/dL — ABNORMAL LOW (ref 12.0–15.0)
Hemoglobin: 11.9 g/dL — ABNORMAL LOW (ref 12.0–15.0)
O2 Saturation: 84 %
O2 Saturation: 82 %
Potassium: 3.7 mmol/L (ref 3.5–5.1)
Potassium: 3.8 mmol/L (ref 3.5–5.1)
Sodium: 142 mmol/L (ref 135–145)
Sodium: 141 mmol/L (ref 135–145)
TCO2: 20 mmol/L — ABNORMAL LOW (ref 22–32)
TCO2: 21 mmol/L — ABNORMAL LOW (ref 22–32)
pCO2 arterial: 33 mmHg (ref 32–48)
pCO2 arterial: 34.6 mmHg (ref 32–48)
pH, Arterial: 7.362 (ref 7.35–7.45)
pH, Arterial: 7.359 (ref 7.35–7.45)
pO2, Arterial: 50 mmHg — ABNORMAL LOW (ref 83–108)
pO2, Arterial: 48 mmHg — ABNORMAL LOW (ref 83–108)

## 2022-02-28 SURGERY — RIGHT/LEFT HEART CATH AND CORONARY ANGIOGRAPHY
Anesthesia: LOCAL

## 2022-02-28 MED ORDER — FENTANYL CITRATE (PF) 100 MCG/2ML IJ SOLN
INTRAMUSCULAR | Status: AC
  Filled 2022-02-28: qty 2

## 2022-02-28 MED ORDER — SODIUM CHLORIDE 0.9 % IV SOLN: 250.0000 mL | INTRAVENOUS | Status: DC | PRN

## 2022-02-28 MED ORDER — MIDAZOLAM HCL 2 MG/2ML IJ SOLN
INTRAMUSCULAR | Status: DC | PRN
  Administered 2022-02-28: 1 mg via INTRAVENOUS

## 2022-02-28 MED ORDER — LABETALOL HCL 5 MG/ML IV SOLN: 10.0000 mg | INTRAVENOUS | Status: AC | PRN

## 2022-02-28 MED ORDER — FLUOXETINE HCL 20 MG PO CAPS
20.0000 mg | ORAL_CAPSULE | Freq: Every day | ORAL | Status: DC
  Administered 2022-03-01 – 2022-03-04 (×4): 20 mg via ORAL
  Filled 2022-02-28 (×4): qty 1

## 2022-02-28 MED ORDER — ATORVASTATIN CALCIUM 80 MG PO TABS
80.0000 mg | ORAL_TABLET | Freq: Every day | ORAL | Status: DC
  Administered 2022-02-28 – 2022-03-03 (×4): 80 mg via ORAL
  Filled 2022-02-28 (×4): qty 1

## 2022-02-28 MED ORDER — BUSPIRONE HCL 10 MG PO TABS
10.0000 mg | ORAL_TABLET | Freq: Two times a day (BID) | ORAL | Status: DC
  Administered 2022-02-28 – 2022-03-04 (×8): 10 mg via ORAL
  Filled 2022-02-28 (×8): qty 1

## 2022-02-28 MED ORDER — CLOPIDOGREL BISULFATE 75 MG PO TABS: 75.0000 mg | ORAL_TABLET | Freq: Every day | ORAL | Status: DC

## 2022-02-28 MED ORDER — HYDRALAZINE HCL 20 MG/ML IJ SOLN: 10.0000 mg | INTRAMUSCULAR | Status: AC | PRN

## 2022-02-28 MED ORDER — LIDOCAINE HCL (PF) 1 % IJ SOLN
INTRAMUSCULAR | Status: DC | PRN
  Administered 2022-02-28 (×2): 2 mL

## 2022-02-28 MED ORDER — ASPIRIN 81 MG PO CHEW: 81.0000 mg | CHEWABLE_TABLET | ORAL | Status: DC

## 2022-02-28 MED ORDER — SODIUM CHLORIDE 0.9% FLUSH: 3.0000 mL | INTRAVENOUS | Status: DC | PRN

## 2022-02-28 MED ORDER — VERAPAMIL HCL 2.5 MG/ML IV SOLN
INTRAVENOUS | Status: AC
  Filled 2022-02-28: qty 2

## 2022-02-28 MED ORDER — HEPARIN (PORCINE) IN NACL 1000-0.9 UT/500ML-% IV SOLN
INTRAVENOUS | Status: AC
  Filled 2022-02-28: qty 1000

## 2022-02-28 MED ORDER — ALBUTEROL SULFATE (2.5 MG/3ML) 0.083% IN NEBU: 2.5000 mg | INHALATION_SOLUTION | Freq: Four times a day (QID) | RESPIRATORY_TRACT | Status: DC | PRN

## 2022-02-28 MED ORDER — CLOPIDOGREL BISULFATE 75 MG PO TABS
75.0000 mg | ORAL_TABLET | Freq: Once | ORAL | Status: AC
  Administered 2022-02-28: 75 mg via ORAL
  Filled 2022-02-28: qty 1

## 2022-02-28 MED ORDER — SODIUM CHLORIDE 0.9 % IV SOLN: INTRAVENOUS | Status: DC

## 2022-02-28 MED ORDER — LORATADINE 10 MG PO TABS
10.0000 mg | ORAL_TABLET | Freq: Every day | ORAL | Status: DC
  Administered 2022-03-01 – 2022-03-04 (×4): 10 mg via ORAL
  Filled 2022-02-28 (×4): qty 1

## 2022-02-28 MED ORDER — IRBESARTAN 75 MG PO TABS: 75.0000 mg | ORAL_TABLET | Freq: Every day | ORAL | Status: DC

## 2022-02-28 MED ORDER — ONDANSETRON HCL 4 MG/2ML IJ SOLN: 4.0000 mg | Freq: Four times a day (QID) | INTRAMUSCULAR | Status: DC | PRN

## 2022-02-28 MED ORDER — MOMETASONE FURO-FORMOTEROL FUM 200-5 MCG/ACT IN AERO
2.0000 | INHALATION_SPRAY | Freq: Two times a day (BID) | RESPIRATORY_TRACT | Status: DC
  Administered 2022-02-28 – 2022-03-04 (×7): 2 via RESPIRATORY_TRACT
  Filled 2022-02-28: qty 8.8

## 2022-02-28 MED ORDER — FUROSEMIDE 10 MG/ML IJ SOLN
40.0000 mg | Freq: Two times a day (BID) | INTRAMUSCULAR | Status: DC
  Administered 2022-02-28: 40 mg via INTRAVENOUS

## 2022-02-28 MED ORDER — SODIUM CHLORIDE 0.9 % WEIGHT BASED INFUSION
3.0000 mL/kg/h | INTRAVENOUS | Status: DC
  Administered 2022-02-28: 3 mL/kg/h via INTRAVENOUS

## 2022-02-28 MED ORDER — IOHEXOL 350 MG/ML SOLN
INTRAVENOUS | Status: DC | PRN
  Administered 2022-02-28: 45 mL

## 2022-02-28 MED ORDER — ACETAMINOPHEN 325 MG PO TABS
650.0000 mg | ORAL_TABLET | ORAL | Status: DC | PRN
  Administered 2022-02-28 – 2022-03-01 (×2): 650 mg via ORAL
  Filled 2022-02-28 (×2): qty 2

## 2022-02-28 MED ORDER — VERAPAMIL HCL 2.5 MG/ML IV SOLN
INTRAVENOUS | Status: DC | PRN
  Administered 2022-02-28: 10 mL via INTRA_ARTERIAL

## 2022-02-28 MED ORDER — FUROSEMIDE 10 MG/ML IJ SOLN
INTRAMUSCULAR | Status: AC
  Filled 2022-02-28: qty 4

## 2022-02-28 MED ORDER — SODIUM CHLORIDE 0.9% FLUSH
3.0000 mL | Freq: Two times a day (BID) | INTRAVENOUS | Status: DC
  Administered 2022-03-01 – 2022-03-03 (×3): 3 mL via INTRAVENOUS

## 2022-02-28 MED ORDER — EZETIMIBE 10 MG PO TABS
10.0000 mg | ORAL_TABLET | Freq: Every day | ORAL | Status: DC
  Administered 2022-03-01 – 2022-03-04 (×4): 10 mg via ORAL
  Filled 2022-02-28 (×4): qty 1

## 2022-02-28 MED ORDER — HEPARIN SODIUM (PORCINE) 1000 UNIT/ML IJ SOLN
INTRAMUSCULAR | Status: DC | PRN
  Administered 2022-02-28: 3500 [IU] via INTRAVENOUS

## 2022-02-28 MED ORDER — ACETAMINOPHEN 325 MG PO TABS: 650.0000 mg | ORAL_TABLET | ORAL | Status: DC | PRN

## 2022-02-28 MED ORDER — SACUBITRIL-VALSARTAN 24-26 MG PO TABS
1.0000 | ORAL_TABLET | Freq: Two times a day (BID) | ORAL | Status: DC
  Administered 2022-02-28 – 2022-03-02 (×4): 1 via ORAL
  Filled 2022-02-28 (×4): qty 1

## 2022-02-28 MED ORDER — PANTOPRAZOLE SODIUM 40 MG PO TBEC
40.0000 mg | DELAYED_RELEASE_TABLET | Freq: Every day | ORAL | Status: DC
  Administered 2022-03-01 – 2022-03-04 (×4): 40 mg via ORAL
  Filled 2022-02-28 (×4): qty 1

## 2022-02-28 MED ORDER — HEPARIN (PORCINE) IN NACL 1000-0.9 UT/500ML-% IV SOLN
INTRAVENOUS | Status: DC | PRN
  Administered 2022-02-28 (×2): 500 mL

## 2022-02-28 MED ORDER — LIDOCAINE HCL (PF) 1 % IJ SOLN
INTRAMUSCULAR | Status: AC
  Filled 2022-02-28: qty 30

## 2022-02-28 MED ORDER — SODIUM CHLORIDE 0.9 % WEIGHT BASED INFUSION: 1.0000 mL/kg/h | INTRAVENOUS | Status: DC

## 2022-02-28 MED ORDER — MONTELUKAST SODIUM 10 MG PO TABS
10.0000 mg | ORAL_TABLET | Freq: Every day | ORAL | Status: DC
  Administered 2022-02-28 – 2022-03-03 (×4): 10 mg via ORAL
  Filled 2022-02-28 (×4): qty 1

## 2022-02-28 MED ORDER — SODIUM CHLORIDE 0.9% FLUSH
3.0000 mL | Freq: Two times a day (BID) | INTRAVENOUS | Status: DC
  Administered 2022-02-28 – 2022-03-04 (×6): 3 mL via INTRAVENOUS

## 2022-02-28 MED ORDER — FENTANYL CITRATE (PF) 100 MCG/2ML IJ SOLN
INTRAMUSCULAR | Status: DC | PRN
  Administered 2022-02-28: 25 ug via INTRAVENOUS

## 2022-02-28 MED ORDER — POTASSIUM CHLORIDE CRYS ER 20 MEQ PO TBCR
40.0000 meq | EXTENDED_RELEASE_TABLET | Freq: Once | ORAL | Status: AC
  Administered 2022-02-28: 40 meq via ORAL
  Filled 2022-02-28: qty 2

## 2022-02-28 MED ORDER — MIDAZOLAM HCL 2 MG/2ML IJ SOLN
INTRAMUSCULAR | Status: AC
  Filled 2022-02-28: qty 2

## 2022-02-28 MED ORDER — ALBUTEROL SULFATE (2.5 MG/3ML) 0.083% IN NEBU: 3.0000 mL | INHALATION_SOLUTION | RESPIRATORY_TRACT | Status: DC | PRN

## 2022-02-28 MED ORDER — FLUTICASONE PROPIONATE 50 MCG/ACT NA SUSP
1.0000 | Freq: Every day | NASAL | Status: DC
  Administered 2022-03-01 – 2022-03-04 (×2): 1 via NASAL
  Filled 2022-02-28: qty 16

## 2022-02-28 MED ORDER — HEPARIN SODIUM (PORCINE) 5000 UNIT/ML IJ SOLN
5000.0000 [IU] | Freq: Three times a day (TID) | INTRAMUSCULAR | Status: DC
  Administered 2022-02-28 – 2022-03-04 (×10): 5000 [IU] via SUBCUTANEOUS
  Filled 2022-02-28 (×10): qty 1

## 2022-02-28 MED ORDER — SODIUM CHLORIDE 0.9% FLUSH
3.0000 mL | Freq: Two times a day (BID) | INTRAVENOUS | Status: DC
  Administered 2022-02-28 – 2022-03-03 (×3): 3 mL via INTRAVENOUS

## 2022-02-28 MED ORDER — HEPARIN SODIUM (PORCINE) 1000 UNIT/ML IJ SOLN
INTRAMUSCULAR | Status: AC
  Filled 2022-02-28: qty 10

## 2022-02-28 MED ORDER — VITAMIN D 50 MCG (2000 UT) PO TABS: 2000.0000 [IU] | ORAL_TABLET | Freq: Every day | ORAL | Status: DC

## 2022-02-28 MED ORDER — FUROSEMIDE 10 MG/ML IJ SOLN
80.0000 mg | Freq: Two times a day (BID) | INTRAMUSCULAR | Status: DC
  Administered 2022-02-28 – 2022-03-01 (×2): 80 mg via INTRAVENOUS
  Filled 2022-02-28 (×3): qty 8

## 2022-02-28 SURGICAL SUPPLY — 12 items
CATH 5FR JL3.5 JR4 ANG PIG MP (CATHETERS) IMPLANT
CATH SWAN GANZ 7F STRAIGHT (CATHETERS) IMPLANT
DEVICE RAD COMP TR BAND LRG (VASCULAR PRODUCTS) IMPLANT
GLIDESHEATH SLEND SS 6F .021 (SHEATH) IMPLANT
GLIDESHEATH SLENDER 7FR .021G (SHEATH) IMPLANT
GUIDEWIRE INQWIRE 1.5J.035X260 (WIRE) IMPLANT
INQWIRE 1.5J .035X260CM (WIRE) ×1
KIT HEART LEFT (KITS) ×1 IMPLANT
PACK CARDIAC CATHETERIZATION (CUSTOM PROCEDURE TRAY) ×1 IMPLANT
SHEATH PROBE COVER 6X72 (BAG) IMPLANT
TRANSDUCER W/STOPCOCK (MISCELLANEOUS) ×1 IMPLANT
TUBING CIL FLEX 10 FLL-RA (TUBING) ×1 IMPLANT

## 2022-02-28 NOTE — Progress Notes (Signed)
TR band removed and transparent dressing placed, level 0 no hematoma, no pain upon palpation, Radial artery A, Reverse barbeau A.

## 2022-02-28 NOTE — Consult Note (Addendum)
Advanced Heart Failure Team Consult Note   Primary Physician: Delford Field, FNP PCP-Cardiologist:  Quay Burow, MD  Reason for Consultation: Acute on Chronic Systolic Heart Failure/ Pulmonary HTN in setting of Mitral Regurgitation   HPI:    Shannon [name] is seen today for evaluation of acute on chronic systolic heart failure and pulmonary hypertension in the setting of severe mitral regurgitation, at the request of Dr. Irish Lack, Cardiology.   72 y/o female w/ h/o chronic diastolic heart failure, HTN, HLD, h/o TIA, GERD and asthma. Former smoker (quit 72 y/o). Noted to have high coronary calcium score of 507 in March 2022. Referred to lipid clinic for aggressive tx of HLD but did not undergo cath in absence of symptoms. Echo 04/2018 showed normal LVEF and RV. No significant valvular dysfunction.   Recently referred back to cardiology for surgical clearance prior to undergoing planned EGD and EBUS (ordered given progressive dysphagia, wt loss and abnormal chest CT). She endorsed exertional dyspnea w/ inability to complete 4METs of activity w/o symptoms. Echo was done and showed drop in EF down to 40-45% w/ diffuse HK, severe LAE mod-severe MR, severely elevated RVSP, RV normal, mod-severe RAE and mod-severe TR. Subsequently referred for [facility].   R/LHC done today showed no significant CAD, only 10% pRCA narrowing, elevated R+ L heart filling pressures and marginal output, (PA saturation 48%, mean RA pressure 18 mmHg, PA pressure 50/28, mean PA pressure 37 mmHg, pulmonary capillary wedge pressure 35/36, mean pulmonary capillary wedge pressure 30 mmHg, cardiac output 3.92 L/min, cardiac index 2.27).   She is being direct admitted from cath lab for diuresis w/ IV diuretics and further w/u of her mitral regurgitation.     Echo 04/2018 LVEF 50-55%, RV normal, mild MR Echo 02/2022 LVEF 40-45%, Severe LAE, mod-severe MR, severely elevated RVSP, RV normal, mod-severe RAE, mod-severe TR    R/LHC (today)    Prox RCA lesion is 10% stenosed.   LV end diastolic pressure is moderately elevated.   There is no aortic valve stenosis.   No angiographically apparent left sided coronary artery disease.   Hemodynamic findings consistent with moderate pulmonary hypertension.   Aortic saturation 83%, PA saturation 48%, mean RA pressure 18 mmHg, PA pressure 50/28, mean PA pressure 37 mmHg, pulmonary capillary wedge pressure 35/36, mean pulmonary capillary wedge pressure 30 mmHg, cardiac output 3.92 L/min, cardiac index 2.27.    Review of Systems: [y] = yes, [ ]  = no   General: Weight gain [ ] ; Weight loss [Y ]; Anorexia [ ] ; Fatigue [ ] ; Fever [ ] ; Chills [ ] ; Weakness [ ]   Cardiac: Chest pain/pressure [ ] ; Resting SOB [ Y]; Exertional SOB [Y ]; Orthopnea [Y ]; Pedal Edema [ ] ; Palpitations [ ] ; Syncope [ ] ; Presyncope [ ] ; Paroxysmal nocturnal dyspnea[ ]   Pulmonary: Cough [ ] ; Wheezing[ ] ; Hemoptysis[ ] ; Sputum [ ] ; Snoring [ ]   GI: Vomiting[ ] ; Dysphagia[Y ]; Melena[ ] ; Hematochezia [ ] ; Heartburn[ ] ; Abdominal pain [ ] ; Constipation [ ] ; Diarrhea [ ] ; BRBPR [ ]   GU: Hematuria[ ] ; Dysuria [ ] ; Nocturia[ ]   Vascular: Pain in legs with walking [ ] ; Pain in feet with lying flat [ ] ; Non-healing sores [ ] ; Stroke [ ] ; TIA [ ] ; Slurred speech [ ] ;  Neuro: Headaches[ ] ; Vertigo[ ] ; Seizures[ ] ; Paresthesias[ ] ;Blurred vision [ ] ; Diplopia [ ] ; Vision changes [ ]   Ortho/Skin: Arthritis [ ] ; Joint pain [ ] ; Muscle pain [ ] ; Joint swelling [ ] ;  Back Pain [ ] ; Rash [ ]   Psych: Depression[ ] ; Anxiety[ ]   Heme: Bleeding problems [ ] ; Clotting disorders [ ] ; Anemia [ ]   Endocrine: Diabetes [ ] ; Thyroid dysfunction[ ]   Home Medications Prior to Admission medications   Medication Sig Start Date End Date Taking? Authorizing Provider  albuterol (PROVENTIL HFA;VENTOLIN HFA) 108 (90 BASE) MCG/ACT inhaler Inhale 2 puffs into the lungs every 2 (two) hours as needed for wheezing or shortness of breath  (cough). 09/21/12  Yes Dammen, Collier Salina, PA-C  albuterol (PROVENTIL) (2.5 MG/3ML) 0.083% nebulizer solution Take 3 mLs (2.5 mg total) by nebulization every 6 (six) hours as needed for wheezing or shortness of breath. 01/07/22  Yes Freddi Starr, MD  Aspirin-Acetaminophen-Caffeine (GOODY HEADACHE PO) Take 2 packets by mouth daily as needed (pain).   Yes [provider]  atorvastatin (LIPITOR) 80 MG tablet Take 1 tablet (80 mg total) by mouth daily at 6 PM. 05/05/18  Yes Svalina, Estill Dooms, MD  busPIRone (BUSPAR) 10 MG tablet Take 10 mg by mouth 2 (two) times daily. 05/30/20  Yes [provider]  clopidogrel (PLAVIX) 75 MG tablet Take 1 tablet (75 mg total) by mouth daily. 06/09/18  Yes Garvin Fila, MD  desloratadine (CLARINEX) 5 MG tablet Take 5 mg by mouth daily.   Yes [provider]  ezetimibe (ZETIA) 10 MG tablet Take 10 mg by mouth daily. 05/11/21  Yes [provider]  FLUoxetine (PROZAC) 20 MG capsule Take 20 mg by mouth daily. 04/19/20  Yes [provider]  fluticasone (FLONASE) 50 MCG/ACT nasal spray Place 1 spray into both nostrils daily.   Yes [provider]  fluticasone-salmeterol (WIXELA INHUB) 500-50 MCG/ACT AEPB Inhale 1 puff into the lungs in the morning and at bedtime. 02/19/22  Yes Freddi Starr, MD  meloxicam (MOBIC) 7.5 MG tablet Take 7.5 mg by mouth daily. 05/24/21  Yes [provider]  montelukast (SINGULAIR) 10 MG tablet Take 1 tablet (10 mg total) by mouth at bedtime. 05/03/21  Yes Freddi Starr, MD  pantoprazole (PROTONIX) 40 MG tablet TAKE 1 TABLET BY MOUTH EVERY DAY 09/28/21  Yes Freddi Starr, MD  valsartan (DIOVAN) 80 MG tablet Take 80 mg by mouth daily. 05/30/19  Yes [provider]  Cholecalciferol (VITAMIN D) 50 MCG (2000 UT) tablet Take 2,000 Units by mouth daily.    [provider]  furosemide (LASIX) 20 MG tablet Take 1 tablet (20 mg total) by mouth as directed. 02/26/22   Marylu Lund., NP    Past Medical History: Past Medical History:  Diagnosis Date   Anxiety    Arthritis    "right knee" (05/26/2014)   Asthma    Chronic lower back pain    Dysrhythmia    irregular heart beat   GERD (gastroesophageal reflux disease)    HLD (hyperlipidemia)    Hypertension    Pneumonia    Stroke [facility])    TIA - ?2021   TIA (transient ischemic attack) 04/2018    Past Surgical History: Past Surgical History:  Procedure Laterality Date   BRONCHIAL BIOPSY  07/10/2021   Procedure: BRONCHIAL BIOPSIES;  Surgeon: Garner Nash, DO;  Location: [place] ENDOSCOPY;  Service: Pulmonary;;   BRONCHIAL BRUSHINGS  07/10/2021   Procedure: BRONCHIAL BRUSHINGS;  Surgeon: Garner Nash, DO;  Location: [place] [facility];  Service: Pulmonary;;   BRONCHIAL NEEDLE ASPIRATION BIOPSY  07/10/2021   Procedure: BRONCHIAL NEEDLE ASPIRATION BIOPSIES;  Surgeon: Garner Nash, DO;  Location: [place] ENDOSCOPY;  Service: Pulmonary;;   BRONCHIAL WASHINGS  07/10/2021   Procedure: BRONCHIAL WASHINGS;  Surgeon: Garner Nash, DO;  Location: [place] [facility];  Service: Pulmonary;;   ESOPHAGOGASTRODUODENOSCOPY N/A 05/27/2014   Procedure: ESOPHAGOGASTRODUODENOSCOPY (EGD);  Surgeon: Carol Ada, MD;  Location: [facility] ENDOSCOPY;  Service: Endoscopy;  Laterality: N/A;   FRACTURE SURGERY     PATELLA FRACTURE SURGERY Right ~ 2009   TONSILLECTOMY  ~ Platinum  1980's   VIDEO BRONCHOSCOPY WITH RADIAL ENDOBRONCHIAL ULTRASOUND  07/10/2021   Procedure: RADIAL ENDOBRONCHIAL ULTRASOUND;  Surgeon: Garner Nash, DO;  Location: [facility] ENDOSCOPY;  Service: Pulmonary;;    Family History: Family History  Problem Relation Age of Onset   Hypertension Mother    Hypertension Father    Colon cancer Father    Hypertension Sister     Social History: Social History   Socioeconomic History   Marital status: Widowed    Spouse name: Not on file   Number of children: Not on file   Years of education: Not on file   Highest  education level: Not on file  Occupational History   Not on file  Tobacco Use   Smoking status: Former    Packs/day: 0.50    Years: 30.00    Total pack years: 15.00    Types: Cigarettes    Quit date: 11/10/1998    Years since quitting: 23.3   Smokeless tobacco: Never  Vaping Use   Vaping Use: Never used  Substance and Sexual Activity   Alcohol use: Yes    Comment: occasional   Drug use: No   Sexual activity: Not Currently  Other Topics Concern   Not on file  Social History Narrative   Not on file   Social Determinants of Health   Financial Resource Strain: Not on file  Food Insecurity: Not on file  Transportation Needs: Not on file  Physical Activity: Not on file  Stress: Not on file  Social Connections: Not on file    Allergies:  Allergies  Allergen Reactions   Amoxicillin-Pot Clavulanate Itching   Other Other (See Comments)    08/06/2018 -    06/05/2016 -    09/15/2014 -    06/16/2014 -    Objective:    Vital Signs:   Temp:  [97 F (36.1 C)] 97 F (36.1 C) (01/11 0725) Pulse Rate:  [0-98] 89 (01/11 0946) Resp:  [16-28] 23 (01/11 0946) BP: (110-130)/(77-89) 118/85 (01/11 0946) SpO2:  [85 %-97 %] 95 % (01/11 0923) Weight:  [64.4 kg] 64.4 kg (01/11 0725)    Weight change: Filed Weights   02/28/22 0725  Weight: 64.4 kg    Intake/Output:  No intake or output data in the 24 hours ending 02/28/22 1007    Physical Exam    General:  fatigued appearing. No resp difficulty HEENT: normal Neck: supple. JVP elevated to jaw . Carotids 2+ bilat; no bruits. No lymphadenopathy or thyromegaly appreciated. Cor: PMI nondisplaced. Regular rate & rhythm. 3/6 MR/TR murmur  Lungs: decreased BS at the bases  Abdomen: soft, nontender, nondistended. No hepatosplenomegaly. No bruits or masses. Good bowel sounds. Extremities: no cyanosis, clubbing, rash, edema Neuro: alert & orientedx3, cranial nerves grossly intact. moves all 4 extremities w/o difficulty. Affect  pleasant   Telemetry   NSR 80s   EKG    N/A   Labs   Basic Metabolic Panel: Recent Labs  Lab 02/26/22 1546  NA 143  K 4.2  CL 108*  CO2 19*  GLUCOSE 118*  BUN 12  CREATININE 1.14*  CALCIUM 9.4    Liver Function Tests: Recent Labs  Lab 02/26/22 1546  AST 31  ALT 25  ALKPHOS 123*  BILITOT 1.6*  PROT 6.8  ALBUMIN 4.4   No results for input(s): "LIPASE", "AMYLASE" in the last 168 hours. No results for input(s): "AMMONIA" in the last 168 hours.  CBC: Recent Labs  Lab 02/26/22 1546  WBC 3.9  HGB 12.3  HCT 36.8  MCV 93  PLT 277    Cardiac Enzymes: No results for input(s): "CKTOTAL", "CKMB", "CKMBINDEX", "TROPONINI" in the last 168 hours.  BNP: BNP (last 3 results) No results for input(s): "BNP" in the last 8760 hours.  ProBNP (last 3 results) Recent Labs    02/26/22 1546  PROBNP 2,865*     CBG: No results for input(s): "GLUCAP" in the last 168 hours.  Coagulation Studies: No results for input(s): "LABPROT", "INR" in the last 72 hours.   Imaging   CARDIAC CATHETERIZATION  Result Date: 02/28/2022   Prox RCA lesion is 10% stenosed.   LV end diastolic pressure is moderately elevated.   There is no aortic valve stenosis.   No angiographically apparent left sided coronary artery disease.   Hemodynamic findings consistent with moderate pulmonary hypertension.   Aortic saturation 83%, PA saturation 48%, mean RA pressure 18 mmHg, PA pressure 50/28, mean PA pressure 37 mmHg, pulmonary capillary wedge pressure 35/36, mean pulmonary capillary wedge pressure 30 mmHg, cardiac output 3.92 L/min, cardiac index 2.27. Volume overloaded based on right heart pressures.  Will admit for IV diuresis and further workup of her mitral regurgitation.  She will also need EGD.  She is currently receiving clopidogrel.  This will likely need to be held prior to EGD.     Medications:     Current Medications:  aspirin  81 mg Oral Pre-Cath   furosemide  40 mg Intravenous  BID    Infusions:  sodium chloride     sodium chloride 1 mL/kg/hr (02/28/22 0850)      Patient Profile   72 y/o female w/ h/o chronic diastolic heart failure, HTN, HLD, h/o TIA, GERD and asthma. Recently found to have new reduction in LV systolic function, pulmonary HTN and severe MR/TR on recent echo. Referred for outpatient R/LHC today c/w NICM w/ markedly elevated filling pressures and severe MR. Being direct admitted from cath lab for IV diuresis and further w/u of MR.   Assessment/Plan   1. Acute Combined Systolic and Diastolic Heart Failure - Echo 04/2018 LVEF 50-55%, RV normal, mild MR - Echo 02/2022 LVEF 40-45%, Severe LAE, mod-severe MR, severely elevated RVSP, RV normal, mod-severe RAE  - NICM, LHC w/ no significant CAD - RHC w/ elevated filling pressures (mRA 18, PA 53/29, mPCW 26) and marginal output, CI 2.21, PA sat 48%  - start diuresis w/ IV Lasix 80 mg bid  - may need milrinone for afterload reduction/ inotropic support   - add spiro 12.5 mg daily   2. Severe Mitral Regurgitation  - suspect functional MR (LA severely dilated) though also may be primary component (valve appeared degenerative on TTE)  - will need TEE after HF better optimized w/ diuresis   3. 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 Erin Fulling  - sleep study 03/2020 no  significant OSA, AHI 2.6/h   4. Dysphagia - EGD on hold until cardiac issues better stabilized  5. Pulmonary  - followed by Dr. Erin Fulling - recent abnormal chest CT/PE, former smoker. Known emphysema - holding off on EBUS until better stabilized from cardiac standpoint   6. Tricuspid Regurgitation  - mod-severe on echo - suspect functional - optimization of HF w/ diuresis and lowering of PA pressures per above  - plan TEE after diuresis   7. H/o TIA - on Plavix per neurology   Length  of Stay: 0  Lyda Jester, PA-C  02/28/2022, 10:07 AM  Advanced Heart Failure Team Pager [phone] (M-F; 7a - 5p)  Please contact [facility] for night-coverage after hours (4p -7a ) and weekends on [email or web address]   Patient seen with PA, agree with the above note.   History as noted above, has noted worsening dyspnea since December but had some shortness of breath present even prior to then.    Echo in 1/24 was noted to have EF 40-45%, mild RV dilation, moderate pulmonary HTN, mod-severe MR and moderate-severe TR.  Cardiac PET in 1/24 showed hypermetabolic mediastinal LAN, either lymphoproliferative disorder or primary bronchogenic CA considered.  LHC/RHC today showed minimal CAD with elevated filling pressures and pulmonary venous hypertension.   She has a history of dysphagia and weight loss.  She will need combination EGD and EBUS with dysphagia and worrisome mediastinal LAN.   General: NAD Neck: JVP 16 cm, no thyromegaly or thyroid nodule.  Lungs: Clear to auscultation bilaterally with normal respiratory effort. CV: Nondisplaced PMI.  Heart regular S1/S2, no S3/S4, 3/6 HSM LLSB/apex.  No peripheral edema.  No carotid bruit.  Normal pedal pulses.  Abdomen: Soft, nontender, no hepatosplenomegaly, no distention.  Skin: Intact without lesions or rashes.  Neurologic: Alert and oriented x 3.  Psych: Normal affect. Extremities: No clubbing or cyanosis.  HEENT: Normal.   1. Acute on chronic systolic CHF: Echo in 0000000 showed low normal EF 50-55%, no significant MR.  Echo in 1/24 showed EF 40-45%, mild RV dilation, moderate pulmonary HTN, mod-severe MR and moderate-severe TR.  RHC/LHC today with elevated left and right heart filling pressures, pulmonary venous hypertension, preserved cardiac output.  Nonischemic cardiomyopathy.  Cause is uncertain, ?valvular disease. She is volume overloaded on exam as well as by RHC.  - Lasix 80 mg IV bid. She has only take Lasix 20 mg one time at home.  -  Stop valsartan (on at home), will start Entresto 24/26 bid.  - Titrate GDMT as needed.  - TEE to assess valvular disease (see below).  - Cardiac MRI when more diuresed.  Would consider possibility of cardiac sarcoidosis with associated pulmonary sarcoidosis given the mediastinal adenopathy.  2. Valvular heart disease: Moderate-severe MR and moderate-severe TR.  ?If this is functional in setting of LV dysfunction, though LV function is not markedly low.  Need closer assessment of valves to try to find cause of MR.  - Plan for TEE.  Will do tomorrow afternoon after she has had some diuresis.  We discussed risks/benefits and she agrees to procedure.  3. Pulmonary hypertension: Group 2 pulmonary venous hypertension.  4. Dysphagia: EGD planned.  Will hold Plavix, potentially could have combination EGD/EBUS early next week.  5. H/o TIA/CVA: She has been on Plavix long-term.  Will hold for EGD/EBUS.  6. Pulmonary: Prior smoker with COPD. PET-CT showed hypermetabolic mediastinal LAN, either lymphoproliferative disorder or primary bronchogenic CA considered.  I also wonder if this could represent  active sarcoidosis.  - Plan for EBUS as above.  She had Plavix on Saturday, held until today then had a dose today with cath.  Will need to discuss with pulmonary and GI timing of EGD/EBUS, ?early next week if she is diureses well.   Marca Ancona 02/28/2022 3:19 PM

## 2022-02-28 NOTE — Progress Notes (Signed)
Heart Failure Navigator Progress Note  Assessed for Heart & Vascular TOC clinic readiness.  Patient does not meet criteria due to Advanced Heart Failure Team patient of Dr. McLean.   Navigator will sign off at this time.   Shannon [name], BSN, RN Heart Failure Nurse Navigator Secure Chat Only   

## 2022-02-28 NOTE — Plan of Care (Signed)
  Problem: Education: Goal: Understanding of CV disease, CV risk reduction, and recovery process will improve Outcome: Progressing Goal: Individualized Educational Video(s) Outcome: Progressing   Problem: Activity: Goal: Ability to return to baseline activity level will improve Outcome: Progressing   Problem: Cardiovascular: Goal: Ability to achieve and maintain adequate cardiovascular perfusion will improve Outcome: Progressing   

## 2022-02-28 NOTE — Interval H&P Note (Signed)
History and Physical Interval Note:  02/28/2022 8:47 AM  Shannon [name]  has presented today for surgery, with the diagnosis of mr.  The various methods of treatment have been discussed with the patient and family. After consideration of risks, benefits and other options for treatment, the patient has consented to  Procedure(s): RIGHT/LEFT HEART CATH AND CORONARY ANGIOGRAPHY (N/A) as a surgical intervention.  The patient's history has been reviewed, patient examined, no change in status, stable for surgery.  I have reviewed the patient's chart and labs.  Questions were answered to the patient's satisfaction.    Planned diagnostic cath.    Larae Grooms

## 2022-02-28 NOTE — H&P (View-Only) (Signed)
  Advanced Heart Failure Team Consult Note   Primary Physician: Williams, Alvesha A, FNP PCP-Cardiologist:  Jonathan Berry, MD  Reason for Consultation: Acute on Chronic Systolic Heart Failure/ Pulmonary HTN in setting of Mitral Regurgitation   HPI:    Shannon [name] is seen today for evaluation of acute on chronic systolic heart failure and pulmonary hypertension in the setting of severe mitral regurgitation, at the request of Dr. Varanasi, Cardiology.   72 y/o female w/ h/o chronic diastolic heart failure, HTN, HLD, h/o TIA, GERD and asthma. Former smoker (quit 72 y/o). Noted to have high coronary calcium score of 507 in March 2022. Referred to lipid clinic for aggressive tx of HLD but did not undergo cath in absence of symptoms. Echo 04/2018 showed normal LVEF and RV. No significant valvular dysfunction.   Recently referred back to cardiology for surgical clearance prior to undergoing planned EGD and EBUS (ordered given progressive dysphagia, wt loss and abnormal chest CT). She endorsed exertional dyspnea w/ inability to complete 4METs of activity w/o symptoms. Echo was done and showed drop in EF down to 40-45% w/ diffuse HK, severe LAE mod-severe MR, severely elevated RVSP, RV normal, mod-severe RAE and mod-severe TR. Subsequently referred for R/LHC.   R/LHC done today showed no significant CAD, only 10% pRCA narrowing, elevated R+ L heart filling pressures and marginal output, (PA saturation 48%, mean RA pressure 18 mmHg, PA pressure 50/28, mean PA pressure 37 mmHg, pulmonary capillary wedge pressure 35/36, mean pulmonary capillary wedge pressure 30 mmHg, cardiac output 3.92 L/min, cardiac index 2.27).   She is being direct admitted from cath lab for diuresis w/ IV diuretics and further w/u of her mitral regurgitation.     Echo 04/2018 LVEF 50-55%, RV normal, mild MR Echo 02/2022 LVEF 40-45%, Severe LAE, mod-severe MR, severely elevated RVSP, RV normal, mod-severe RAE, mod-severe TR    R/LHC (today)    Prox RCA lesion is 10% stenosed.   LV end diastolic pressure is moderately elevated.   There is no aortic valve stenosis.   No angiographically apparent left sided coronary artery disease.   Hemodynamic findings consistent with moderate pulmonary hypertension.   Aortic saturation 83%, PA saturation 48%, mean RA pressure 18 mmHg, PA pressure 50/28, mean PA pressure 37 mmHg, pulmonary capillary wedge pressure 35/36, mean pulmonary capillary wedge pressure 30 mmHg, cardiac output 3.92 L/min, cardiac index 2.27.    Review of Systems: [y] = yes, [ ] = no   General: Weight gain [ ]; Weight loss [Y ]; Anorexia [ ]; Fatigue [ ]; Fever [ ]; Chills [ ]; Weakness [ ]  Cardiac: Chest pain/pressure [ ]; Resting SOB [ Y]; Exertional SOB [Y ]; Orthopnea [Y ]; Pedal Edema [ ]; Palpitations [ ]; Syncope [ ]; Presyncope [ ]; Paroxysmal nocturnal dyspnea[ ]  Pulmonary: Cough [ ]; Wheezing[ ]; Hemoptysis[ ]; Sputum [ ]; Snoring [ ]  GI: Vomiting[ ]; Dysphagia[Y ]; Melena[ ]; Hematochezia [ ]; Heartburn[ ]; Abdominal pain [ ]; Constipation [ ]; Diarrhea [ ]; BRBPR [ ]  GU: Hematuria[ ]; Dysuria [ ]; Nocturia[ ]  Vascular: Pain in legs with walking [ ]; Pain in feet with lying flat [ ]; Non-healing sores [ ]; Stroke [ ]; TIA [ ]; Slurred speech [ ];  Neuro: Headaches[ ]; Vertigo[ ]; Seizures[ ]; Paresthesias[ ];Blurred vision [ ]; Diplopia [ ]; Vision changes [ ]  Ortho/Skin: Arthritis [ ]; Joint pain [ ]; Muscle pain [ ]; Joint swelling [ ];   Back Pain [ ]; Rash [ ]  Psych: Depression[ ]; Anxiety[ ]  Heme: Bleeding problems [ ]; Clotting disorders [ ]; Anemia [ ]  Endocrine: Diabetes [ ]; Thyroid dysfunction[ ]  Home Medications Prior to Admission medications   Medication Sig Start Date End Date Taking? Authorizing Provider  albuterol (PROVENTIL HFA;VENTOLIN HFA) 108 (90 BASE) MCG/ACT inhaler Inhale 2 puffs into the lungs every 2 (two) hours as needed for wheezing or shortness of breath  (cough). 09/21/12  Yes Dammen, Peter, PA-C  albuterol (PROVENTIL) (2.5 MG/3ML) 0.083% nebulizer solution Take 3 mLs (2.5 mg total) by nebulization every 6 (six) hours as needed for wheezing or shortness of breath. 01/07/22  Yes Dewald, Jonathan B, MD  Aspirin-Acetaminophen-Caffeine (GOODY HEADACHE PO) Take 2 packets by mouth daily as needed (pain).   Yes [provider]  atorvastatin (LIPITOR) 80 MG tablet Take 1 tablet (80 mg total) by mouth daily at 6 PM. 05/05/18  Yes Svalina, Gorica, MD  busPIRone (BUSPAR) 10 MG tablet Take 10 mg by mouth 2 (two) times daily. 05/30/20  Yes [provider]  clopidogrel (PLAVIX) 75 MG tablet Take 1 tablet (75 mg total) by mouth daily. 06/09/18  Yes Sethi, Pramod S, MD  desloratadine (CLARINEX) 5 MG tablet Take 5 mg by mouth daily.   Yes [provider]  ezetimibe (ZETIA) 10 MG tablet Take 10 mg by mouth daily. 05/11/21  Yes [provider]  FLUoxetine (PROZAC) 20 MG capsule Take 20 mg by mouth daily. 04/19/20  Yes [provider]  fluticasone (FLONASE) 50 MCG/ACT nasal spray Place 1 spray into both nostrils daily.   Yes [provider]  fluticasone-salmeterol (WIXELA INHUB) 500-50 MCG/ACT AEPB Inhale 1 puff into the lungs in the morning and at bedtime. 02/19/22  Yes Dewald, Jonathan B, MD  meloxicam (MOBIC) 7.5 MG tablet Take 7.5 mg by mouth daily. 05/24/21  Yes [provider]  montelukast (SINGULAIR) 10 MG tablet Take 1 tablet (10 mg total) by mouth at bedtime. 05/03/21  Yes Dewald, Jonathan B, MD  pantoprazole (PROTONIX) 40 MG tablet TAKE 1 TABLET BY MOUTH EVERY DAY 09/28/21  Yes Dewald, Jonathan B, MD  valsartan (DIOVAN) 80 MG tablet Take 80 mg by mouth daily. 05/30/19  Yes [provider]  Cholecalciferol (VITAMIN D) 50 MCG (2000 UT) tablet Take 2,000 Units by mouth daily.    [provider]  furosemide (LASIX) 20 MG tablet Take 1 tablet (20 mg total) by mouth as directed. 02/26/22   Dick, Ernest  H Jr., NP    Past Medical History: Past Medical History:  Diagnosis Date   Anxiety    Arthritis    "right knee" (05/26/2014)   Asthma    Chronic lower back pain    Dysrhythmia    irregular heart beat   GERD (gastroesophageal reflux disease)    HLD (hyperlipidemia)    Hypertension    Pneumonia    Stroke (HCC)    TIA - ?2021   TIA (transient ischemic attack) 04/2018    Past Surgical History: Past Surgical History:  Procedure Laterality Date   BRONCHIAL BIOPSY  07/10/2021   Procedure: BRONCHIAL BIOPSIES;  Surgeon: Icard, Bradley L, DO;  Location: [facility];  Service: Pulmonary;;   BRONCHIAL BRUSHINGS  07/10/2021   Procedure: BRONCHIAL BRUSHINGS;  Surgeon: Icard, Bradley L, DO;  Location: [facility];  Service: Pulmonary;;   BRONCHIAL NEEDLE ASPIRATION BIOPSY  07/10/2021   Procedure: BRONCHIAL NEEDLE ASPIRATION BIOPSIES;  Surgeon: Icard, Bradley L, DO;    Location: [facility];  Service: Pulmonary;;   BRONCHIAL WASHINGS  07/10/2021   Procedure: BRONCHIAL WASHINGS;  Surgeon: Garner Nash, DO;  Location: [facility];  Service: Pulmonary;;   ESOPHAGOGASTRODUODENOSCOPY N/A 05/27/2014   Procedure: ESOPHAGOGASTRODUODENOSCOPY (EGD);  Surgeon: Carol Ada, MD;  Location: [facility];  Service: Endoscopy;  Laterality: N/A;   FRACTURE SURGERY     PATELLA FRACTURE SURGERY Right ~ 2009   TONSILLECTOMY  ~ Platinum  1980's   VIDEO BRONCHOSCOPY WITH RADIAL ENDOBRONCHIAL ULTRASOUND  07/10/2021   Procedure: RADIAL ENDOBRONCHIAL ULTRASOUND;  Surgeon: Garner Nash, DO;  Location: [facility];  Service: Pulmonary;;    Family History: Family History  Problem Relation Age of Onset   Hypertension Mother    Hypertension Father    Colon cancer Father    Hypertension Sister     Social History: Social History   Socioeconomic History   Marital status: Widowed    Spouse name: Not on file   Number of children: Not on file   Years of education: Not on file   Highest  education level: Not on file  Occupational History   Not on file  Tobacco Use   Smoking status: Former    Packs/day: 0.50    Years: 30.00    Total pack years: 15.00    Types: Cigarettes    Quit date: 11/10/1998    Years since quitting: 23.3   Smokeless tobacco: Never  Vaping Use   Vaping Use: Never used  Substance and Sexual Activity   Alcohol use: Yes    Comment: occasional   Drug use: No   Sexual activity: Not Currently  Other Topics Concern   Not on file  Social History Narrative   Not on file   Social Determinants of Health   Financial Resource Strain: Not on file  Food Insecurity: Not on file  Transportation Needs: Not on file  Physical Activity: Not on file  Stress: Not on file  Social Connections: Not on file    Allergies:  Allergies  Allergen Reactions   Amoxicillin-Pot Clavulanate Itching   Other Other (See Comments)    08/06/2018 -    06/05/2016 -    09/15/2014 -    06/16/2014 -    Objective:    Vital Signs:   Temp:  [97 F (36.1 C)] 97 F (36.1 C) (01/11 0725) Pulse Rate:  [0-98] 89 (01/11 0946) Resp:  [16-28] 23 (01/11 0946) BP: (110-130)/(77-89) 118/85 (01/11 0946) SpO2:  [85 %-97 %] 95 % (01/11 0923) Weight:  [64.4 kg] 64.4 kg (01/11 0725)    Weight change: Filed Weights   02/28/22 0725  Weight: 64.4 kg    Intake/Output:  No intake or output data in the 24 hours ending 02/28/22 1007    Physical Exam    General:  fatigued appearing. No resp difficulty HEENT: normal Neck: supple. JVP elevated to jaw . Carotids 2+ bilat; no bruits. No lymphadenopathy or thyromegaly appreciated. Cor: PMI nondisplaced. Regular rate & rhythm. 3/6 MR/TR murmur  Lungs: decreased BS at the bases  Abdomen: soft, nontender, nondistended. No hepatosplenomegaly. No bruits or masses. Good bowel sounds. Extremities: no cyanosis, clubbing, rash, edema Neuro: alert & orientedx3, cranial nerves grossly intact. moves all 4 extremities w/o difficulty. Affect  pleasant   Telemetry   NSR 80s   EKG    N/A   Labs   Basic Metabolic Panel: Recent Labs  Lab 02/26/22 1546  NA 143  K 4.2  CL 108*  CO2 19*  GLUCOSE 118*  BUN 12  CREATININE 1.14*  CALCIUM 9.4    Liver Function Tests: Recent Labs  Lab 02/26/22 1546  AST 31  ALT 25  ALKPHOS 123*  BILITOT 1.6*  PROT 6.8  ALBUMIN 4.4   No results for input(s): "LIPASE", "AMYLASE" in the last 168 hours. No results for input(s): "AMMONIA" in the last 168 hours.  CBC: Recent Labs  Lab 02/26/22 1546  WBC 3.9  HGB 12.3  HCT 36.8  MCV 93  PLT 277    Cardiac Enzymes: No results for input(s): "CKTOTAL", "CKMB", "CKMBINDEX", "TROPONINI" in the last 168 hours.  BNP: BNP (last 3 results) No results for input(s): "BNP" in the last 8760 hours.  ProBNP (last 3 results) Recent Labs    02/26/22 1546  PROBNP 2,865*     CBG: No results for input(s): "GLUCAP" in the last 168 hours.  Coagulation Studies: No results for input(s): "LABPROT", "INR" in the last 72 hours.   Imaging   CARDIAC CATHETERIZATION  Result Date: 02/28/2022   Prox RCA lesion is 10% stenosed.   LV end diastolic pressure is moderately elevated.   There is no aortic valve stenosis.   No angiographically apparent left sided coronary artery disease.   Hemodynamic findings consistent with moderate pulmonary hypertension.   Aortic saturation 83%, PA saturation 48%, mean RA pressure 18 mmHg, PA pressure 50/28, mean PA pressure 37 mmHg, pulmonary capillary wedge pressure 35/36, mean pulmonary capillary wedge pressure 30 mmHg, cardiac output 3.92 L/min, cardiac index 2.27. Volume overloaded based on right heart pressures.  Will admit for IV diuresis and further workup of her mitral regurgitation.  She will also need EGD.  She is currently receiving clopidogrel.  This will likely need to be held prior to EGD.     Medications:     Current Medications:  aspirin  81 mg Oral Pre-Cath   furosemide  40 mg Intravenous  BID    Infusions:  sodium chloride     sodium chloride 1 mL/kg/hr (02/28/22 0850)      Patient Profile   72 y/o female w/ h/o chronic diastolic heart failure, HTN, HLD, h/o TIA, GERD and asthma. Recently found to have new reduction in LV systolic function, pulmonary HTN and severe MR/TR on recent echo. Referred for outpatient R/LHC today c/w NICM w/ markedly elevated filling pressures and severe MR. Being direct admitted from cath lab for IV diuresis and further w/u of MR.   Assessment/Plan   1. Acute Combined Systolic and Diastolic Heart Failure - Echo 04/2018 LVEF 50-55%, RV normal, mild MR - Echo 02/2022 LVEF 40-45%, Severe LAE, mod-severe MR, severely elevated RVSP, RV normal, mod-severe RAE  - NICM, LHC w/ no significant CAD - RHC w/ elevated filling pressures (mRA 18, PA 53/29, mPCW 26) and marginal output, CI 2.21, PA sat 48%  - start diuresis w/ IV Lasix 80 mg bid  - may need milrinone for afterload reduction/ inotropic support   - add spiro 12.5 mg daily   2. Severe Mitral Regurgitation  - suspect functional MR (LA severely dilated) though also may be primary component (valve appeared degenerative on TTE)  - will need TEE after HF better optimized w/ diuresis   3. 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 Erin Fulling  - sleep study 03/2020 no  significant OSA, AHI 2.6/h   4. Dysphagia - EGD on hold until cardiac issues better stabilized  5. Pulmonary  - followed by Dr. Dewald - recent abnormal chest CT/PE, former smoker. Known emphysema - holding off on EBUS until better stabilized from cardiac standpoint   6. Tricuspid Regurgitation  - mod-severe on echo - suspect functional - optimization of HF w/ diuresis and lowering of PA pressures per above  - plan TEE after diuresis   7. H/o TIA - on Plavix per neurology   Length  of Stay: 0  Brittainy Simmons, PA-C  02/28/2022, 10:07 AM  Advanced Heart Failure Team Pager [phone] (M-F; 7a - 5p)  Please contact [facility] for night-coverage after hours (4p -7a ) and weekends on [email or web address]   Patient seen with PA, agree with the above note.   History as noted above, has noted worsening dyspnea since December but had some shortness of breath present even prior to then.    Echo in 1/24 was noted to have EF 40-45%, mild RV dilation, moderate pulmonary HTN, mod-severe MR and moderate-severe TR.  Cardiac PET in 1/24 showed hypermetabolic mediastinal LAN, either lymphoproliferative disorder or primary bronchogenic CA considered.  LHC/RHC today showed minimal CAD with elevated filling pressures and pulmonary venous hypertension.   She has a history of dysphagia and weight loss.  She will need combination EGD and EBUS with dysphagia and worrisome mediastinal LAN.   General: NAD Neck: JVP 16 cm, no thyromegaly or thyroid nodule.  Lungs: Clear to auscultation bilaterally with normal respiratory effort. CV: Nondisplaced PMI.  Heart regular S1/S2, no S3/S4, 3/6 HSM LLSB/apex.  No peripheral edema.  No carotid bruit.  Normal pedal pulses.  Abdomen: Soft, nontender, no hepatosplenomegaly, no distention.  Skin: Intact without lesions or rashes.  Neurologic: Alert and oriented x 3.  Psych: Normal affect. Extremities: No clubbing or cyanosis.  HEENT: Normal.   1. Acute on chronic systolic CHF: Echo in 3/20 showed low normal EF 50-55%, no significant MR.  Echo in 1/24 showed EF 40-45%, mild RV dilation, moderate pulmonary HTN, mod-severe MR and moderate-severe TR.  RHC/LHC today with elevated left and right heart filling pressures, pulmonary venous hypertension, preserved cardiac output.  Nonischemic cardiomyopathy.  Cause is uncertain, ?valvular disease. She is volume overloaded on exam as well as by RHC.  - Lasix 80 mg IV bid. She has only take Lasix 20 mg one time at home.  -  Stop valsartan (on at home), will start Entresto 24/26 bid.  - Titrate GDMT as needed.  - TEE to assess valvular disease (see below).  - Cardiac MRI when more diuresed.  Would consider possibility of cardiac sarcoidosis with associated pulmonary sarcoidosis given the mediastinal adenopathy.  2. Valvular heart disease: Moderate-severe MR and moderate-severe TR.  ?If this is functional in setting of LV dysfunction, though LV function is not markedly low.  Need closer assessment of valves to try to find cause of MR.  - Plan for TEE.  Will do tomorrow afternoon after she has had some diuresis.  We discussed risks/benefits and she agrees to procedure.  3. Pulmonary hypertension: Group 2 pulmonary venous hypertension.  4. Dysphagia: EGD planned.  Will hold Plavix, potentially could have combination EGD/EBUS early next week.  5. H/o TIA/CVA: She has been on Plavix long-term.  Will hold for EGD/EBUS.  6. Pulmonary: Prior smoker with COPD. PET-CT showed hypermetabolic mediastinal LAN, either lymphoproliferative disorder or primary bronchogenic CA considered.  I also wonder if this could represent   active sarcoidosis.  - Plan for EBUS as above.  She had Plavix on Saturday, held until today then had a dose today with cath.  Will need to discuss with pulmonary and GI timing of EGD/EBUS, ?early next week if she is diureses well.   Marca Ancona 02/28/2022 3:19 PM

## 2022-02-28 NOTE — Progress Notes (Signed)
Patient arrived to cath lab holding area. Right radial TR band intact. No bleeding or hematoma noted. Reverse barbeau test complete; type C noted. Spoke with procedural room regarding previous noted type D. They felt that it was more artery spasm that was causing their result. O2 sat 94% noted to right thumb. Patient denies numbness or tingling. Will continue to monitor per MD orders.Cindee Salt

## 2022-03-01 ENCOUNTER — Encounter (HOSPITAL_COMMUNITY): Payer: Self-pay | Admitting: Cardiology

## 2022-03-01 ENCOUNTER — Ambulatory Visit (HOSPITAL_COMMUNITY): Admission: RE | Admit: 2022-03-01 | Payer: Medicare HMO | Source: Home / Self Care | Admitting: Gastroenterology

## 2022-03-01 ENCOUNTER — Other Ambulatory Visit: Payer: Self-pay

## 2022-03-01 ENCOUNTER — Encounter (HOSPITAL_COMMUNITY): Admission: RE | Payer: Self-pay | Source: Home / Self Care

## 2022-03-01 ENCOUNTER — Inpatient Hospital Stay (HOSPITAL_BASED_OUTPATIENT_CLINIC_OR_DEPARTMENT_OTHER): Payer: Medicare HMO

## 2022-03-01 ENCOUNTER — Inpatient Hospital Stay (HOSPITAL_COMMUNITY): Payer: Medicare HMO | Admitting: Anesthesiology

## 2022-03-01 ENCOUNTER — Encounter (HOSPITAL_COMMUNITY)
Admission: RE | Disposition: A | Discharge status: Home / Self Care | Payer: Self-pay | Source: Ambulatory Visit | Attending: Cardiology

## 2022-03-01 ENCOUNTER — Inpatient Hospital Stay (HOSPITAL_COMMUNITY): Payer: Medicare HMO

## 2022-03-01 DIAGNOSIS — I509 Heart failure, unspecified: Secondary | ICD-10-CM

## 2022-03-01 DIAGNOSIS — I429 Cardiomyopathy, unspecified: Secondary | ICD-10-CM | POA: Diagnosis not present

## 2022-03-01 DIAGNOSIS — I340 Nonrheumatic mitral (valve) insufficiency: Secondary | ICD-10-CM

## 2022-03-01 DIAGNOSIS — I080 Rheumatic disorders of both mitral and aortic valves: Secondary | ICD-10-CM | POA: Diagnosis not present

## 2022-03-01 DIAGNOSIS — I110 Hypertensive heart disease with heart failure: Secondary | ICD-10-CM

## 2022-03-01 DIAGNOSIS — I5043 Acute on chronic combined systolic (congestive) and diastolic (congestive) heart failure: Secondary | ICD-10-CM | POA: Diagnosis not present

## 2022-03-01 DIAGNOSIS — J449 Chronic obstructive pulmonary disease, unspecified: Secondary | ICD-10-CM

## 2022-03-01 DIAGNOSIS — Z87891 Personal history of nicotine dependence: Secondary | ICD-10-CM

## 2022-03-01 HISTORY — PX: TEE WITHOUT CARDIOVERSION: SHX5443

## 2022-03-01 LAB — SPECIMEN STATUS REPORT

## 2022-03-01 LAB — BASIC METABOLIC PANEL
Anion gap: 15 (ref 5–15)
BUN: 20 mg/dL (ref 8–23)
CO2: 19 mmol/L — ABNORMAL LOW (ref 22–32)
Calcium: 9.1 mg/dL (ref 8.9–10.3)
Chloride: 101 mmol/L (ref 98–111)
Creatinine, Ser: 1.26 mg/dL — ABNORMAL HIGH (ref 0.44–1.00)
GFR, Estimated: 45 mL/min — ABNORMAL LOW (ref >60)
Glucose, Bld: 133 mg/dL — ABNORMAL HIGH (ref 70–99)
Potassium: 3.6 mmol/L (ref 3.5–5.1)
Sodium: 135 mmol/L (ref 135–145)

## 2022-03-01 LAB — ECHO TEE
MV M vel: 4.65 m/s
MV Peak grad: 86.5 mmHg
Radius: 0.9 cm
S' Lateral: 4.8 cm

## 2022-03-01 LAB — NOVEL CORONAVIRUS, NAA: SARS-CoV-2, NAA: NOT DETECTED

## 2022-03-01 LAB — GLUCOSE, CAPILLARY: Glucose-Capillary: 123 mg/dL — ABNORMAL HIGH (ref 70–99)

## 2022-03-01 SURGERY — EGD (ESOPHAGOGASTRODUODENOSCOPY)
Anesthesia: General

## 2022-03-01 SURGERY — ECHOCARDIOGRAM, TRANSESOPHAGEAL
Anesthesia: Monitor Anesthesia Care

## 2022-03-01 MED ORDER — LIDOCAINE 2% (20 MG/ML) 5 ML SYRINGE
INTRAMUSCULAR | Status: DC | PRN
  Administered 2022-03-01: 60 mg via INTRAVENOUS
  Administered 2022-03-01: 40 mg via INTRAVENOUS

## 2022-03-01 MED ORDER — ADENOSINE 6 MG/2ML IV SOLN: 6.0000 mg | Freq: Once | INTRAVENOUS | Status: AC

## 2022-03-01 MED ORDER — PROPOFOL 500 MG/50ML IV EMUL
INTRAVENOUS | Status: DC | PRN
  Administered 2022-03-01: 125 ug/kg/min via INTRAVENOUS

## 2022-03-01 MED ORDER — GADOBUTROL 1 MMOL/ML IV SOLN
9.0000 mL | Freq: Once | INTRAVENOUS | Status: AC | PRN
  Administered 2022-03-01: 9 mL via INTRAVENOUS

## 2022-03-01 MED ORDER — ADENOSINE 6 MG/2ML IV SOLN: 12.0000 mg | Freq: Once | INTRAVENOUS | Status: AC

## 2022-03-01 MED ORDER — SPIRONOLACTONE 12.5 MG HALF TABLET
12.5000 mg | ORAL_TABLET | Freq: Every day | ORAL | Status: DC
  Administered 2022-03-01 – 2022-03-04 (×4): 12.5 mg via ORAL
  Filled 2022-03-01 (×4): qty 1

## 2022-03-01 MED ORDER — ORAL CARE MOUTH RINSE: 15.0000 mL | OROMUCOSAL | Status: DC | PRN

## 2022-03-01 MED ORDER — ADENOSINE 6 MG/2ML IV SOLN
INTRAVENOUS | Status: AC
  Administered 2022-03-01: 6 mg via INTRAVENOUS
  Filled 2022-03-01: qty 4

## 2022-03-01 MED ORDER — ADENOSINE 6 MG/2ML IV SOLN
INTRAVENOUS | Status: AC
  Administered 2022-03-01: 12 mg via INTRAVENOUS
  Filled 2022-03-01: qty 2

## 2022-03-01 MED ORDER — PHENYLEPHRINE 80 MCG/ML (10ML) SYRINGE FOR IV PUSH (FOR BLOOD PRESSURE SUPPORT)
PREFILLED_SYRINGE | INTRAVENOUS | Status: DC | PRN
  Administered 2022-03-01 (×6): 80 ug via INTRAVENOUS

## 2022-03-01 MED ORDER — PHENYLEPHRINE HCL-NACL 20-0.9 MG/250ML-% IV SOLN
INTRAVENOUS | Status: DC | PRN
  Administered 2022-03-01: 80 ug/min via INTRAVENOUS

## 2022-03-01 MED ORDER — TORSEMIDE 20 MG PO TABS
20.0000 mg | ORAL_TABLET | Freq: Every day | ORAL | Status: DC
  Administered 2022-03-02: 20 mg via ORAL
  Filled 2022-03-01: qty 1

## 2022-03-01 MED ORDER — PROPOFOL 10 MG/ML IV BOLUS
INTRAVENOUS | Status: DC | PRN
  Administered 2022-03-01 (×2): 20 mg via INTRAVENOUS
  Administered 2022-03-01: 70 mg via INTRAVENOUS

## 2022-03-01 MED ORDER — BUTAMBEN-TETRACAINE-BENZOCAINE 2-2-14 % EX AERO
INHALATION_SPRAY | CUTANEOUS | Status: DC | PRN
  Administered 2022-03-01: 2 via TOPICAL

## 2022-03-01 NOTE — Interval H&P Note (Signed)
History and Physical Interval Note:  03/01/2022 1:37 PM  Shannon [name]  has presented today for surgery, with the diagnosis of severe AS.  The various methods of treatment have been discussed with the patient and family. After consideration of risks, benefits and other options for treatment, the patient has consented to  Procedure(s): TRANSESOPHAGEAL ECHOCARDIOGRAM (TEE) (N/A) as a surgical intervention.  The patient's history has been reviewed, patient examined, no change in status, stable for surgery.  I have reviewed the patient's chart and labs.  Questions were answered to the patient's satisfaction.     [name]

## 2022-03-01 NOTE — Significant Event (Signed)
Rapid Response Event Note   Reason for Call :  SVT-180, BP-75/50, dizziness  Initial Focused Assessment:  Pt lying in bed with eyes open, in no visible distress. Pt denies CP/SOB but does c/o dizziness. Lungs diminished t/o. Skin warm and dry.  HR-181, BP-70/59, RR-18, SpO2-95% on RA.   Pt placed on zoll.  2303-6mg  Adenosine given with no change in HR. 2305-12mg  Adenosine given with conversion to ST at 2306, HR-115, SBP-85s.  Interventions:  EKG-SVT 6mg  adenosine 12mg  adenosine BMP/MG/LA Plan of Care:  HR now 115(ST), SBP-85. Pt no longer c/o dizziness. Continue to monitor BPs frequently until return to baseline. Call RRT if further assistance needed.    Event Summary:   MD Notified: Dr. Marcelle Smiling notifed and came to bedside.  Call Time:2251 Arrival Time:2256 End TFTD:3220  Dillard Essex, RN

## 2022-03-01 NOTE — Progress Notes (Signed)
Pt had intermittent leg cramps during the night. Given Tylenol and SCD placed on legs. Pt offered water for hydration, cramps decreased and pt able to settle down for bed. SRP, RN

## 2022-03-01 NOTE — Progress Notes (Signed)
Pt picked up to endo by RN for TEE.

## 2022-03-01 NOTE — Progress Notes (Signed)
  Echocardiogram Echocardiogram Transesophageal has been performed.  Shannon [name] 03/01/2022, 2:43 PM

## 2022-03-01 NOTE — Progress Notes (Addendum)
Advanced Heart Failure Rounding Note  PCP-Cardiologist: Nanetta Batty, MD   Subjective:    Plan for TEE today to better access MR  Diuresed with 80 IV lasix BID yesterday. - 3 L UOP. Down 9lbs  Feels fine this morning, able to get some rest. Denies CP/SOB.   R/LHC 1/1: no significant CAD, only 10% pRCA narrowing, elevated R+ L heart filling pressures and marginal output, (PA saturation 48%, mean RA pressure 18 mmHg, PA pressure 50/28, mean PA pressure 37 mmHg, pulmonary capillary wedge pressure 35/36, mean pulmonary capillary wedge pressure 30 mmHg, cardiac output 3.92 L/min, cardiac index 2.27).    Objective:   Weight Range: 62.8 kg Body mass index is 22.35 kg/m.   Vital Signs:   Temp:  [97.6 F (36.4 C)-98.1 F (36.7 C)] 97.6 F (36.4 C) (01/12 0719) Pulse Rate:  [0-99] 88 (01/12 0719) Resp:  [16-28] 18 (01/12 0719) BP: (113-138)/(69-87) 138/80 (01/12 0719) SpO2:  [85 %-97 %] 90 % (01/12 0832) Weight:  [62.8 kg-67.1 kg] 62.8 kg (01/12 0503) Last BM Date : 03/01/22  Weight change: Filed Weights   02/28/22 0725 02/28/22 1454 03/01/22 0503  Weight: 64.4 kg 67.1 kg 62.8 kg    Intake/Output:   Intake/Output Summary (Last 24 hours) at 03/01/2022 0906 Last data filed at 03/01/2022 0800 Gross per 24 hour  Intake 1007.83 ml  Output 3400 ml  Net -2392.17 ml      Physical Exam    General:  elderly appearing.  No respiratory difficulty HEENT: normal Neck: supple. JVD ~12. Carotids 2+ bilat; no bruits. No lymphadenopathy or thyromegaly appreciated. Cor: PMI nondisplaced. Regular rate & rhythm. No rubs, gallops or murmurs. Lungs: clear Abdomen: soft, nontender, nondistended. No hepatosplenomegaly. No bruits or masses. Good bowel sounds. Extremities: no cyanosis, clubbing, rash, edema  Neuro: alert & oriented x 3, cranial nerves grossly intact. moves all 4 extremities w/o difficulty. Affect pleasant.   Telemetry   NSR 80s (Personally reviewed)    EKG    No  new EKG to review  Labs    CBC Recent Labs    02/26/22 1546 02/28/22 0907 02/28/22 0915 02/28/22 1515  WBC 3.9  --   --  4.0  HGB 12.3   < > 11.9* 12.2  HCT 36.8   < > 35.0* 37.0  MCV 93  --   --  91.4  PLT 277  --   --  305   < > = values in this interval not displayed.   Basic Metabolic Panel Recent Labs    33/29/51 1546 02/28/22 0907 02/28/22 0915 02/28/22 1515 03/01/22 0052  NA 143   < > 142  --  135  K 4.2   < > 3.7  --  3.6  CL 108*  --   --   --  101  CO2 19*  --   --   --  19*  GLUCOSE 118*  --   --   --  133*  BUN 12  --   --   --  20  CREATININE 1.14*  --   --  1.33* 1.26*  CALCIUM 9.4  --   --   --  9.1   < > = values in this interval not displayed.   Liver Function Tests Recent Labs    02/26/22 1546  AST 31  ALT 25  ALKPHOS 123*  BILITOT 1.6*  PROT 6.8  ALBUMIN 4.4   No results for input(s): "LIPASE", "AMYLASE" in the last  72 hours. Cardiac Enzymes No results for input(s): "CKTOTAL", "CKMB", "CKMBINDEX", "TROPONINI" in the last 72 hours.  BNP: BNP (last 3 results) No results for input(s): "BNP" in the last 8760 hours.  ProBNP (last 3 results) Recent Labs    02/26/22 1546  PROBNP 2,865*     D-Dimer No results for input(s): "DDIMER" in the last 72 hours. Hemoglobin A1C No results for input(s): "HGBA1C" in the last 72 hours. Fasting Lipid Panel No results for input(s): "CHOL", "HDL", "LDLCALC", "TRIG", "CHOLHDL", "LDLDIRECT" in the last 72 hours. Thyroid Function Tests No results for input(s): "TSH", "T4TOTAL", "T3FREE", "THYROIDAB" in the last 72 hours.  Invalid input(s): "FREET3"  Other results:   Imaging    No results found.   Medications:     Scheduled Medications:  atorvastatin  80 mg Oral q1800   busPIRone  10 mg Oral BID   ezetimibe  10 mg Oral Daily   FLUoxetine  20 mg Oral Daily   fluticasone  1 spray Each Nare Daily   furosemide  80 mg Intravenous BID   heparin  5,000 Units Subcutaneous Q8H   loratadine   10 mg Oral Daily   mometasone-formoterol  2 puff Inhalation BID   montelukast  10 mg Oral QHS   pantoprazole  40 mg Oral Daily   sacubitril-valsartan  1 tablet Oral BID   sodium chloride flush  3 mL Intravenous Q12H   sodium chloride flush  3 mL Intravenous Q12H   sodium chloride flush  3 mL Intravenous Q12H    Infusions:  sodium chloride     sodium chloride     sodium chloride 20 mL/hr at 02/28/22 2046    PRN Medications: sodium chloride, sodium chloride, acetaminophen, albuterol, ondansetron (ZOFRAN) IV, sodium chloride flush, sodium chloride flush    Patient Profile   72 y/o female w/ h/o chronic diastolic heart failure, HTN, HLD, h/o TIA, GERD and asthma. Former smoker (quit 72 y/o) AHF asked to see for acute on chronic systolic heart failure/ pulmonary HTN in setting of mitral regurgitation.   Assessment/Plan   1. Acute on chronic systolic CHF: Echo in 6/28 showed low normal EF 50-55%, no significant MR.  Echo in 1/24 showed EF 40-45%, mild RV dilation, moderate pulmonary HTN, mod-severe MR and moderate-severe TR.  RHC/LHC today with elevated left and right heart filling pressures, pulmonary venous hypertension, preserved cardiac output.  Nonischemic cardiomyopathy.  Cause is uncertain, ?valvular disease. She is volume overloaded on exam as well as by RHC.  - Continue Lasix 80 mg IV bid.   - Continue Entresto 24/26 bid.  - Start spiro 12.5 mg daily - Titrate GDMT as needed.  - TEE to assess valvular disease (see below).  - Cardiac MRI when more diuresed.  Would consider possibility of cardiac sarcoidosis with associated pulmonary sarcoidosis given the mediastinal adenopathy.   2. Valvular heart disease: Moderate-severe MR and moderate-severe TR.  ?If this is functional in setting of LV dysfunction, though LV function is not markedly low.  Need closer assessment of valves to try to find cause of MR.  - Plan for TEE.  Plan for later today.  Risks/benefits discussed with MD and  she agrees to procedure.   3. Pulmonary hypertension: Group 2 pulmonary venous hypertension. PAP 53/29 on RHC   4. Dysphagia: EGD planned.  Will hold Plavix, potentially could have combination EGD/EBUS early next week.   5. H/o TIA/CVA: She has been on Plavix long-term.  Will hold for EGD/EBUS.   6. Pulmonary:  Prior smoker with COPD. PET-CT showed hypermetabolic mediastinal LAN, either lymphoproliferative disorder or primary bronchogenic CA considered.  I also wonder if this could represent active sarcoidosis.  - Plan for EBUS as above.  She had Plavix on Saturday, held until today then had a dose today with cath.  Will need to discuss with pulmonary and GI timing of EGD/EBUS, ?early next week if she is diureses well.   Length of Stay: [place], NP  03/01/2022, 9:06 AM  Advanced Heart Failure Team Pager [phone] (M-F; 7a - 5p)  Please contact [facility] for night-coverage after hours (5p -7a ) and weekends on [email or web address]  Patient seen with NP, agree with the above note.   Good diuresis with weight down.  Creatinine stable.   TEE done today, see report.  EF 30-35%, severe MR, moderate TR, moderate AI.    General: NAD Neck: No JVD, no thyromegaly or thyroid nodule.  Lungs: Clear to auscultation bilaterally with normal respiratory effort. CV: Nondisplaced PMI.  Heart regular S1/S2, no S3/S4, 3/6 HSM apex.  No peripheral edema.   Abdomen: Soft, nontender, no hepatosplenomegaly, no distention.  Skin: Intact without lesions or rashes.  Neurologic: Alert and oriented x 3.  Psych: Normal affect. Extremities: No clubbing or cyanosis.  HEENT: Normal.   1. Acute on chronic systolic CHF: Echo in 0/99 showed low normal EF 50-55%, no significant MR.  Echo in 1/24 showed EF 40-45%, mild RV dilation, moderate pulmonary HTN, mod-severe MR and moderate-severe TR.  RHC/LHC with elevated left and right heart filling pressures, pulmonary venous hypertension, preserved cardiac output.   Nonischemic cardiomyopathy.  Cause is uncertain, ?valvular disease. TEE done today showed EF 30-35%, mild RV dysfunction, severe MR, moderate TR, moderate AI.  She has diuresed well and no longer looks volume overloaded.  - Stop IV Lasix, start torsemide 20 mg daily tomorrow.  - Continue Entresto 24/26 bid.  - Spironolactone 12.5 daily.  - Will order cardiac MRI.  Would consider possibility of cardiac sarcoidosis with associated pulmonary sarcoidosis given the mediastinal adenopathy.  2. Valvular heart disease: TEE today showed severe MR.  The valve is abnormal with leaflet thickening and poor coapatation, ?underlying rheumatic or inflammatory etiology.  The aortic valve is also abnormal with thickening and doming.  - Will have her assessed by structural heart team, will need to decide Mitraclip versus surgery.  With low EF and also ongoing workup for mediastinal lymphadenopathy, surgery would be high risk.  3. Pulmonary hypertension: Group 2 pulmonary venous hypertension.  4. Dysphagia: EGD planned.  Will hold Plavix, potentially could have combination EGD/EBUS early next week.  5. H/o TIA/CVA: She has been on Plavix long-term.  Will hold for EGD/EBUS.  6. Pulmonary: Prior smoker with COPD. PET-CT showed hypermetabolic mediastinal LAN, either lymphoproliferative disorder or primary bronchogenic CA considered.  I also wonder if this could represent active sarcoidosis.  - Plan for EBUS as above.  She had Plavix on Saturday, held until today then had a dose today with cath.  Will need to discuss with pulmonary and GI timing of EGD/EBUS, ?early next week if she is diureses well.   Will involve structural heart team as well as GI and pulmonary at this point.   Loralie Champagne 03/01/2022 4:08 PM

## 2022-03-01 NOTE — Anesthesia Preprocedure Evaluation (Signed)
Anesthesia Evaluation  Patient identified by MRN, date of birth, ID band Patient awake    Reviewed: Allergy & Precautions, NPO status , Patient's Chart, lab work & pertinent test results  Airway Mallampati: II  TM Distance: >3 FB Neck ROM: Full    Dental no notable dental hx.    Pulmonary asthma , sleep apnea , COPD,  COPD inhaler, former smoker   Pulmonary exam normal        Cardiovascular hypertension, Pt. on medications and Pt. on home beta blockers +CHF  + Valvular Problems/Murmurs MR  Rhythm:Regular Rate:Normal + Systolic murmurs ECHO 4431: IMPRESSIONS   1. Left ventricular ejection fraction, by estimation, is 40 to 45%. The  left ventricle has mildly decreased function. The left ventricle  demonstrates global hypokinesis. Indeterminate diastolic filling due to  E-A fusion.   2. Right ventricular systolic function is normal. The right ventricular  size is mildly enlarged. There is severely elevated pulmonary artery  systolic pressure.   3. Left atrial size was severely dilated.   4. Right atrial size was mild to moderately dilated.   5. EROA 0.37 cm2. The mitral valve is degenerative. Moderate to severe  mitral valve regurgitation.   6. Tricuspid valve regurgitation is moderate to severe.   7. Aortic valve regurgitation is mild. Aortic valve  sclerosis/calcification is present, without any evidence of aortic  stenosis.   8. There is mild dilatation of the ascending aorta, measuring 36 mm.   9. The inferior vena cava is normal in size with greater than 50%  respiratory variability, suggesting right atrial pressure of 3 mmHg.     Neuro/Psych   Anxiety     CVA    GI/Hepatic Neg liver ROS,GERD  Medicated,,  Endo/Other  negative endocrine ROS    Renal/GU   negative genitourinary   Musculoskeletal  (+) Arthritis , Osteoarthritis,    Abdominal Normal abdominal exam  (+)   Peds  Hematology negative hematology  ROS (+)   Anesthesia Other Findings   Reproductive/Obstetrics                             Anesthesia Physical Anesthesia Plan  ASA: 3  Anesthesia Plan: MAC   Post-op Pain Management:    Induction: Intravenous  PONV Risk Score and Plan: 2 and Propofol infusion and Treatment may vary due to age or medical condition  Airway Management Planned: Simple Face Mask, Natural Airway and Nasal Cannula  Additional Equipment: None  Intra-op Plan:   Post-operative Plan:   Informed Consent: I have reviewed the patients History and Physical, chart, labs and discussed the procedure including the risks, benefits and alternatives for the proposed anesthesia with the patient or authorized representative who has indicated his/her understanding and acceptance.     Dental advisory given  Plan Discussed with: CRNA  Anesthesia Plan Comments:        Anesthesia Quick Evaluation

## 2022-03-01 NOTE — Transfer of Care (Signed)
Immediate Anesthesia Transfer of Care Note  Patient: Shannon [name]  Procedure(s) Performed: TRANSESOPHAGEAL ECHOCARDIOGRAM (TEE)  Patient Location: PACU  Anesthesia Type:MAC  Level of Consciousness: awake  Airway & Oxygen Therapy: Patient Spontanous Breathing and Patient connected to nasal cannula oxygen  Post-op Assessment: Report given to RN and Post -op Vital signs reviewed and stable  Post vital signs: Reviewed and stable  Last Vitals:  Vitals Value Taken Time  BP 101/80 03/01/22 1415  Temp 36.1 C 03/01/22 1415  Pulse 87 03/01/22 1417  Resp 30 03/01/22 1417  SpO2 94 % 03/01/22 1417  Vitals shown include unvalidated device data.  Last Pain:  Vitals:   03/01/22 1415  TempSrc: Temporal  PainSc: 0-No pain         Complications: No notable events documented.

## 2022-03-01 NOTE — Anesthesia Postprocedure Evaluation (Signed)
Anesthesia Post Note  Patient: Shannon [name]  Procedure(s) Performed: TRANSESOPHAGEAL ECHOCARDIOGRAM (TEE)     Patient location during evaluation: PACU Anesthesia Type: MAC Level of consciousness: awake and alert Pain management: pain level controlled Vital Signs Assessment: post-procedure vital signs reviewed and stable Respiratory status: spontaneous breathing, nonlabored ventilation, respiratory function stable and patient connected to nasal cannula oxygen Cardiovascular status: stable and blood pressure returned to baseline Postop Assessment: no apparent nausea or vomiting Anesthetic complications: no   No notable events documented.  Last Vitals:  Vitals:   03/01/22 1424 03/01/22 1429  BP:  119/67  Pulse: 85 87  Resp: (!) 26 (!) 26  Temp:    SpO2: 91% 91%    Last Pain:  Vitals:   03/01/22 1429  TempSrc:   PainSc: 0-No pain                 Belenda Cruise P Marquin Patino

## 2022-03-01 NOTE — Significant Event (Signed)
Notified that patient had been in an SVT for >15 mins with blood pressures slightly lower than prior (MAPs 50-60s). She was symptomatic with some lightheadedness.   On arrival to the room, patient BP was 75/50 with Hrs 180s sustained. Rapid response was called.  Given low BP and symptoms, adenosine was administered x 2 (6 mg first, then 12 mg)  Reverted to sinus rhythm and symptoms improved. BP was MAP 65. HR 115 sinus tach.    Labs were ordered and medications were reviewed. At this time, given her BP will hold on started BB  Billey Chang, MD  Cardiology fellow

## 2022-03-01 NOTE — TOC Initial Note (Signed)
Transition of Care [facility]) - Initial/Assessment Note    Patient Details  Name: Shannon [name] MRN: [id number] Date of Birth: [birth year]  Transition of Care [facility] CM/SW Contact:    Erenest Rasher, RN Phone Number: [phone] 03/01/2022, 12:35 PM  Clinical Narrative:                 HF TOC CM spoke to pt and states her son comes and goes, but her sisters help if she needs assistance. Her sister, [place] takes her to appts. Has RW at home. Provided pt with a scale for daily weights. Contacted sisters, Fraser Din and [place], states they will assist at dc if needed. [place] will provide transportation home. Pt may benefit from [facility] RN for HF Disease Management. Will need HH RN orders with F2F.  Offered choice for [facility]. Sister agreeable to [place]. Contacted [facility] rep, Claiborne Billings with new referral.   Expected Discharge Plan: Home/Self Care Barriers to Discharge: Continued Medical Work up   Patient Goals and CMS Choice Patient states their goals for this hospitalization and ongoing recovery are:: wants to get better CMS Medicare.gov Compare Post Acute Care list provided to:: Patient        Expected Discharge Plan and Services   Discharge Planning Services: CM Consult   Living arrangements for the past 2 months: Single Family Home                                      Prior Living Arrangements/Services Living arrangements for the past 2 months: Single Family Home Lives with:: Self Patient language and need for interpreter reviewed:: Yes        Need for Family Participation in Patient Care: No (Comment) Care giver support system in place?: Yes (comment) Current home services: DME (rolling walker) Criminal Activity/Legal Involvement Pertinent to Current Situation/Hospitalization: No - Comment as needed  Activities of Daily Living Home Assistive Devices/Equipment: None ADL Screening (condition at time of admission) Patient's cognitive ability adequate to safely  complete daily activities?: Yes Is the patient deaf or have difficulty hearing?: No Does the patient have difficulty seeing, even when wearing glasses/contacts?: No Does the patient have difficulty concentrating, remembering, or making decisions?: No Patient able to express need for assistance with ADLs?: Yes Does the patient have difficulty dressing or bathing?: No Independently performs ADLs?: Yes (appropriate for developmental age) Does the patient have difficulty walking or climbing stairs?: Yes Weakness of Legs: Both Weakness of Arms/Hands: None  Permission Sought/Granted Permission sought to share information with : Case Manager, Family Supports Permission granted to share information with : Yes, Verbal Permission Granted  Share Information with NAME: [place] granted to share info w Relationship: sister  Permission granted to share info w Contact Information: [phone]  Emotional Assessment Appearance:: Appears stated age Attitude/Demeanor/Rapport: Engaged Affect (typically observed): Accepting Orientation: : Oriented to Self, Oriented to Place, Oriented to  Time, Oriented to Situation   Psych Involvement: No (comment)  Admission diagnosis:  Mitral regurgitation [I34.0] Acute on chronic systolic heart failure, NYHA class 3 (HCC) [I50.23] Patient Active Problem List   Diagnosis Date Noted   Mitral regurgitation 02/28/2022   Acute on chronic combined systolic and diastolic CHF (congestive heart failure) ([place]) 02/28/2022   COPD with acute exacerbation (HCC)    Acute respiratory failure with hypoxia (HCC)    Nodule of upper lobe of left lung  Asthma exacerbation 06/19/2021   Community acquired pneumonia 06/19/2021   OSA (obstructive sleep apnea) 03/29/2020   Palpitations 08/17/2019   TIA (transient ischemic attack) 05/04/2018   Chest pain 05/26/2014   AKI (acute kidney injury) ([place]) 05/26/2014   Pain in the chest 05/26/2014   Diarrhea  05/26/2014   Alcohol intoxication ([place])    Hypokalemia    G E R D 07/02/2006   Dysphagia 07/02/2006   HYPERCHOLESTEROLEMIA 04/17/2006   HYPERTENSION, BENIGN SYSTEMIC 04/17/2006   Allergic rhinitis 04/17/2006   PCP:  Delford Field, FNP Pharmacy:   [id number] - [place], [place] [address] Phone: [phone] Fax: [phone]     Social Determinants of Health (SDOH) Social History: SDOH Screenings   Food Insecurity: No Food Insecurity (02/28/2022)  Housing: Low Risk  (02/28/2022)  Transportation Needs: No Transportation Needs (02/28/2022)  Utilities: Not At Risk (02/28/2022)  Depression (PHQ2-9): Low Risk  (06/15/2018)  Tobacco Use: Medium Risk (03/01/2022)   SDOH Interventions:     Readmission Risk Interventions     No data to display

## 2022-03-02 DIAGNOSIS — I5043 Acute on chronic combined systolic (congestive) and diastolic (congestive) heart failure: Secondary | ICD-10-CM | POA: Diagnosis not present

## 2022-03-02 LAB — BASIC METABOLIC PANEL
Anion gap: 11 (ref 5–15)
Anion gap: 15 (ref 5–15)
BUN: 19 mg/dL (ref 8–23)
BUN: 20 mg/dL (ref 8–23)
CO2: 21 mmol/L — ABNORMAL LOW (ref 22–32)
CO2: 27 mmol/L (ref 22–32)
Calcium: 8.7 mg/dL — ABNORMAL LOW (ref 8.9–10.3)
Calcium: 9 mg/dL (ref 8.9–10.3)
Chloride: 97 mmol/L — ABNORMAL LOW (ref 98–111)
Chloride: 99 mmol/L (ref 98–111)
Creatinine, Ser: 1.51 mg/dL — ABNORMAL HIGH (ref 0.44–1.00)
Creatinine, Ser: 1.51 mg/dL — ABNORMAL HIGH (ref 0.44–1.00)
GFR, Estimated: 37 mL/min — ABNORMAL LOW (ref >60)
GFR, Estimated: 37 mL/min — ABNORMAL LOW (ref >60)
Glucose, Bld: 136 mg/dL — ABNORMAL HIGH (ref 70–99)
Glucose, Bld: 138 mg/dL — ABNORMAL HIGH (ref 70–99)
Potassium: 3.1 mmol/L — ABNORMAL LOW (ref 3.5–5.1)
Potassium: 3.3 mmol/L — ABNORMAL LOW (ref 3.5–5.1)
Sodium: 135 mmol/L (ref 135–145)
Sodium: 135 mmol/L (ref 135–145)

## 2022-03-02 LAB — LACTIC ACID, PLASMA: Lactic Acid, Venous: 1.9 mmol/L (ref 0.5–1.9)

## 2022-03-02 LAB — MAGNESIUM: Magnesium: 1.4 mg/dL — ABNORMAL LOW (ref 1.7–2.4)

## 2022-03-02 LAB — LIPOPROTEIN A (LPA): Lipoprotein (a): 222.3 nmol/L — ABNORMAL HIGH (ref <75.0)

## 2022-03-02 MED ORDER — MAGNESIUM SULFATE 4 GM/100ML IV SOLN
4.0000 g | Freq: Once | INTRAVENOUS | Status: AC
  Administered 2022-03-02: 4 g via INTRAVENOUS
  Filled 2022-03-02: qty 100

## 2022-03-02 MED ORDER — SODIUM CHLORIDE 0.9 % IV SOLN: INTRAVENOUS | Status: DC

## 2022-03-02 MED ORDER — PEG 3350-KCL-NA BICARB-NACL 420 G PO SOLR
4000.0000 mL | Freq: Once | ORAL | Status: AC
  Administered 2022-03-02: 4000 mL via ORAL
  Filled 2022-03-02: qty 4000

## 2022-03-02 MED ORDER — POTASSIUM CHLORIDE CRYS ER 20 MEQ PO TBCR
40.0000 meq | EXTENDED_RELEASE_TABLET | ORAL | Status: AC
  Administered 2022-03-02 (×2): 40 meq via ORAL
  Filled 2022-03-02 (×2): qty 2

## 2022-03-02 NOTE — Evaluation (Signed)
Physical Therapy Evaluation Patient Details Name: Shannon [name] MRN: [id number] DOB: 1950-09-08 Today's Date: 03/02/2022  History of Present Illness  Pt is a 72yo female admitted with severe mitral regurgitation with acute combined systolic and diastolic heart failure and pulmonary HTN. PMH: GERD, HLD, TIA, anxiety   Clinical Impression  Pt functioning near baseline. Pt with decreased activity tolerance however suspect this to be baseline. Pt with noted drop in SpO2 into 80s on RA, 90s on 2LO2 via [place]. Pt sedentary at baseline. Pt functioning at min guard without AD. Acute PT to cont to monitor.        Recommendations for follow up therapy are one component of a multi-disciplinary discharge planning process, led by the attending physician.  Recommendations may be updated based on patient status, additional functional criteria and insurance authorization.  Follow Up Recommendations No PT follow up      Assistance Recommended at Discharge Intermittent Supervision/Assistance  Patient can return home with the following  Assist for transportation;Help with stairs or ramp for entrance    Equipment Recommendations None recommended by PT  Recommendations for Other Services       Functional Status Assessment Patient has had a recent decline in their functional status and demonstrates the ability to make significant improvements in function in a reasonable and predictable amount of time.     Precautions / Restrictions Precautions Precautions: Other (comment) Precaution Comments: watch SpO2 Restrictions Weight Bearing Restrictions: No      Mobility  Bed Mobility Overal bed mobility: Modified Independent             General bed mobility comments: HOB elevated    Transfers Overall transfer level: Needs assistance Equipment used: None Transfers: Sit to/from Stand Sit to Stand: Min guard           General transfer comment: no difficulty, guarded due to first time up     Ambulation/Gait Ambulation/Gait assistance: Min guard Gait Distance (Feet): 200 Feet Assistive device: None Gait Pattern/deviations: Step-through pattern, Decreased stride length Gait velocity: dec Gait velocity interpretation: 1.31 - 2.62 ft/sec, indicative of limited community ambulator   General Gait Details: pt with noted DOE and SpO2 at 90% on RA, pt given 2LO2 via  s/p 75', pt's SPO2 93%. Slow guarded gait but steady, pt with report of 2/4 DOE  Financial trader Rankin (Stroke Patients Only)       Balance Overall balance assessment: Mild deficits observed, not formally tested                                           Pertinent Vitals/Pain Pain Assessment Pain Assessment: No/denies pain    Home Living Family/patient expects to be discharged to:: Private residence Living Arrangements: Children Available Help at Discharge: Family;Available PRN/intermittently (other family/friends available) Type of Home: House Home Access: Stairs to enter Entrance Stairs-Rails: None Entrance Stairs-Number of Steps: 2   Home Layout: One level Home Equipment: Conservation officer, nature (2 wheels)      Prior Function Prior Level of Function : Independent/Modified Independent             Mobility Comments: no AD ADLs Comments: indep     Hand Dominance   Dominant Hand: Right    Extremity/Trunk Assessment   Upper Extremity Assessment Upper Extremity Assessment: Overall [facility]  for tasks assessed    Lower Extremity Assessment Lower Extremity Assessment: Generalized weakness    Cervical / Trunk Assessment Cervical / Trunk Assessment: Normal  Communication   Communication: No difficulties  Cognition Arousal/Alertness: Awake/alert Behavior During Therapy: WFL for tasks assessed/performed Overall Cognitive Status: Within Functional Limits for tasks assessed                                           General Comments General comments (skin integrity, edema, etc.): SpO2 low 90s on RA, SPO2 at 97% on 2LO2 via [place]  at rest    Exercises     Assessment/Plan    PT Assessment Patient needs continued PT services  PT Problem List Decreased strength;Decreased range of motion;Decreased activity tolerance;Decreased balance;Decreased mobility;Cardiopulmonary status limiting activity       PT Treatment Interventions DME instruction;Gait training;Stair training;Functional mobility training;Therapeutic activities;Therapeutic exercise;Balance training;Neuromuscular re-education    PT Goals (Current goals can be found in the Care Plan section)  Acute Rehab PT Goals Patient Stated Goal: home PT Goal Formulation: With patient Time For Goal Achievement: 03/16/22 Potential to Achieve Goals: Good Additional Goals Additional Goal #1: Pt to score >19 on DGI to indicate minimal falls risk.    Frequency Min 3X/week     Co-evaluation               AM-PAC PT "6 Clicks" Mobility  Outcome Measure Help needed turning from your back to your side while in a flat bed without using bedrails?: None Help needed moving from lying on your back to sitting on the side of a flat bed without using bedrails?: None Help needed moving to and from a bed to a chair (including a wheelchair)?: None Help needed standing up from a chair using your arms (e.g., wheelchair or bedside chair)?: A Little Help needed to walk in hospital room?: A Little Help needed climbing 3-5 steps with a railing? : A Little 6 Click Score: 21    End of Session Equipment Utilized During Treatment: Oxygen;Gait belt Activity Tolerance: Patient tolerated treatment well Patient left: in chair;with call bell/phone within reach;with chair alarm set Nurse Communication: Mobility status PT Visit Diagnosis: Unsteadiness on feet (R26.81);Muscle weakness (generalized) (M62.81);Difficulty in walking, not elsewhere classified (R26.2)    Time:  6712-4580 PT Time Calculation (min) (ACUTE ONLY): 20 min   Charges:   PT Evaluation $PT Eval Low Complexity: 1 Low          Kittie Plater, PT, DPT Acute Rehabilitation Services Secure chat preferred Office #: [phone]   Berline Lopes 03/02/2022, 2:30 PM

## 2022-03-02 NOTE — Plan of Care (Signed)
  Problem: Education: Goal: Understanding of CV disease, CV risk reduction, and recovery process will improve Outcome: Progressing Goal: Individualized Educational Video(s) Outcome: Progressing   Problem: Activity: Goal: Ability to return to baseline activity level will improve Outcome: Progressing   Problem: Cardiovascular: Goal: Ability to achieve and maintain adequate cardiovascular perfusion will improve Outcome: Progressing   

## 2022-03-02 NOTE — Progress Notes (Signed)
Patient ID: Shannon [name], female   DOB: 07-Jul-1950, 72 y.o.   MRN: [id number]     Advanced Heart Failure Rounding Note  PCP-Cardiologist: Nanetta Batty, MD   Subjective:    SVT in 160s overnight, possible AVNRT.  Converted with adenosine.  BP was low so beta blocker not started.  This morning, SBP 90s-100s.  Creatinine higher at 1.5.   No dyspnea.   R/LHC 1/11: no significant CAD, only 10% pRCA narrowing, elevated R+ L heart filling pressures and marginal output, (PA saturation 48%, mean RA pressure 18 mmHg, PA pressure 50/28, mean PA pressure 37 mmHg, pulmonary capillary wedge pressure 35/36, mean pulmonary capillary wedge pressure 30 mmHg, cardiac output 3.92 L/min, cardiac index 2.27).    TEE (1/13): EF 30-35%, severe MR, moderate TR, moderate AI.   Objective:   Weight Range: 60.2 kg Body mass index is 21.42 kg/m.   Vital Signs:   Temp:  [97 F (36.1 C)-97.8 F (36.6 C)] 97.8 F (36.6 C) (01/13 0813) Pulse Rate:  [85-114] 94 (01/13 0813) Resp:  [18-29] 18 (01/13 0813) BP: (73-123)/(51-80) 97/60 (01/13 0813) SpO2:  [91 %-98 %] 98 % (01/13 0813) Weight:  [60.2 kg] 60.2 kg (01/13 0529) Last BM Date : 03/01/22  Weight change: Filed Weights   02/28/22 1454 03/01/22 0503 03/02/22 0529  Weight: 67.1 kg 62.8 kg 60.2 kg    Intake/Output:   Intake/Output Summary (Last 24 hours) at 03/02/2022 1019 Last data filed at 03/02/2022 0813 Gross per 24 hour  Intake 643.85 ml  Output 800 ml  Net -156.15 ml      Physical Exam    General: NAD Neck: No JVD, no thyromegaly or thyroid nodule.  Lungs: Clear to auscultation bilaterally with normal respiratory effort. CV: Nondisplaced PMI.  Heart regular S1/S2, no S3/S4, 2/6 HSM apex/LLSB.  No peripheral edema.   Abdomen: Soft, nontender, no hepatosplenomegaly, no distention.  Skin: Intact without lesions or rashes.  Neurologic: Alert and oriented x 3.  Psych: Normal affect. Extremities: No clubbing or cyanosis.  HEENT:  Normal.    Telemetry   NSR 80s, SVT 160s last  night (Personally reviewed)    EKG    No new EKG to review  Labs    CBC Recent Labs    02/28/22 0915 02/28/22 1515  WBC  --  4.0  HGB 11.9* 12.2  HCT 35.0* 37.0  MCV  --  91.4  PLT  --  305   Basic Metabolic Panel Recent Labs    00/17/49 2319 03/02/22 0031  NA 135 135  K 3.3* 3.1*  CL 97* 99  CO2 27 21*  GLUCOSE 138* 136*  BUN 19 20  CREATININE 1.51* 1.51*  CALCIUM 9.0 8.7*  MG 1.4*  --    Liver Function Tests No results for input(s): "AST", "ALT", "ALKPHOS", "BILITOT", "PROT", "ALBUMIN" in the last 72 hours.  No results for input(s): "LIPASE", "AMYLASE" in the last 72 hours. Cardiac Enzymes No results for input(s): "CKTOTAL", "CKMB", "CKMBINDEX", "TROPONINI" in the last 72 hours.  BNP: BNP (last 3 results) No results for input(s): "BNP" in the last 8760 hours.  ProBNP (last 3 results) Recent Labs    02/26/22 1546  PROBNP 2,865*     D-Dimer No results for input(s): "DDIMER" in the last 72 hours. Hemoglobin A1C No results for input(s): "HGBA1C" in the last 72 hours. Fasting Lipid Panel No results for input(s): "CHOL", "HDL", "LDLCALC", "TRIG", "CHOLHDL", "LDLDIRECT" in the last 72 hours. Thyroid Function Tests No results  for input(s): "TSH", "T4TOTAL", "T3FREE", "THYROIDAB" in the last 72 hours.  Invalid input(s): "FREET3"  Other results:   Imaging    ECHO TEE  Result Date: 03/01/2022    TRANSESOPHOGEAL ECHO REPORT   Patient Name:   Shannon [name] Date of Exam: 03/01/2022 Medical Rec #:  [id number]          Height:       66.0 in Accession #:    [id number]         Weight:       138.4 lb Date of Birth:  26-Sep-1950          BSA:          1.710 m Patient Age:    71 years           BP:           123/72 mmHg Patient Gender: F                  HR:           117 bpm. Exam Location:  Inpatient Procedure: 3D Echo, Cardiac Doppler, Color Doppler and Transesophageal Echo Indications:     I34.0  Nonrheumatic mitral (valve) insufficiency  History:         Patient has prior history of Echocardiogram examinations, most                  recent 02/21/2022. CHF, Abnormal ECG, COPD, Mitral Valve Disease                  and Aortic Valve Disease, Signs/Symptoms:Chest Pain; Risk                  Factors:Hypertension, Dyslipidemia and Sleep Apnea. ETOH.  Sonographer:     Sheralyn Boatman RDCS Referring Phys:  [id number] ALMA L DIAZ Diagnosing Phys: Wilfred Lacy PROCEDURE: After discussion of the risks and benefits of a TEE, an informed consent was obtained from the patient. The transesophogeal probe was passed without difficulty through the esophogus of the patient. Imaged were obtained with the patient in a left lateral decubitus position. Sedation performed by different physician. The patient was monitored while under deep sedation. Anesthestetic sedation was provided intravenously by Anesthesiology: 253mg  of Propofol, 100mg  of Lidocaine. The patient's vital signs; including heart rate, blood pressure, and oxygen saturation; remained stable throughout the procedure. Supplementary images were obtained from transthoracic windows as indicated to answer the clinical question. The patient developed no complications during the procedure.  IMPRESSIONS  1. Left ventricular ejection fraction, by estimation, is 30 to 35%. The left ventricle has moderately decreased function. The left ventricle demonstrates global hypokinesis. The left ventricular internal cavity size was mildly dilated. There is mild concentric left ventricular hypertrophy.  2. Right ventricular systolic function is mildly reduced. The right ventricular size is mildly enlarged. There is moderately elevated pulmonary artery systolic pressure. The estimated right ventricular systolic pressure is 52.3 mmHg.  3. Left atrial size was severely dilated. No left atrial/left atrial appendage thrombus was detected.  4. Right atrial size was mildly dilated.  5. No ASD or PFO by  color doppler.  6. The mitral valve is abnormal. Severe mitral valve regurgitation. The mitral valve is thickened at the leaflet tips with poor coaptation. There is restriction of the posterior leaflet. 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 There are 2 main jets of mitral regurgitation. PISA measured for one jet, ERO 0.32 cm^2. The total vena contracta  area was 0.58 cm^2. There was systolic flow reversal in the pulmonary vein doppler pattern. No evidence of mitral stenosis.  7. The tricuspid valve is abnormal. Tricuspid valve regurgitation is moderate.  8. The aortic valve tips appeared thickened and domed. The aortic valve is tricuspid. Aortic valve regurgitation is moderate. No aortic stenosis is present.  9. The inferior vena cava is normal in size with greater than 50% respiratory variability, suggesting right atrial pressure of 3 mmHg. FINDINGS  Left Ventricle: Left ventricular ejection fraction, by estimation, is 30 to 35%. The left ventricle has moderately decreased function. The left ventricle demonstrates global hypokinesis. The left ventricular internal cavity size was mildly dilated. There is mild concentric left ventricular hypertrophy. Right Ventricle: The right ventricular size is mildly enlarged. No increase in right ventricular wall thickness. Right ventricular systolic function is mildly reduced. There is moderately elevated pulmonary artery systolic pressure. The tricuspid regurgitant velocity is 3.51 m/s, and with an assumed right atrial pressure of 3 mmHg, the estimated right ventricular systolic pressure is 43.1 mmHg. Left Atrium: Left atrial size was severely dilated. No left atrial/left atrial appendage thrombus was detected. Right Atrium: Right atrial size was mildly dilated. Pericardium: Trivial pericardial effusion is present. Mitral Valve: The mitral valve is abnormal. Severe mitral valve regurgitation. No evidence of mitral valve stenosis. MV  peak gradient, 4.0 mmHg. The mean mitral valve gradient is 2.0 mmHg. Tricuspid Valve: The tricuspid valve is abnormal. Tricuspid valve regurgitation is moderate. Aortic Valve: The aortic valve tips appeared thickened and domed. The aortic valve is tricuspid. Aortic valve regurgitation is moderate. No aortic stenosis is present. Pulmonic Valve: The pulmonic valve was normal in structure. Pulmonic valve regurgitation is trivial. Aorta: The aortic root is normal in size and structure. Venous: The inferior vena cava is normal in size with greater than 50% respiratory variability, suggesting right atrial pressure of 3 mmHg. IAS/Shunts: No ASD or PFO by color doppler. Additional Comments: Spectral Doppler performed. LEFT VENTRICLE PLAX 2D LVIDd:         5.60 cm LVIDs:         4.80 cm LV PW:         1.30 cm LV IVS:        0.80 cm LVOT diam:     2.10 cm LVOT Area:     3.46 cm  IVC IVC diam: 1.00 cm MITRAL VALVE                  TRICUSPID VALVE MV Peak grad: 4.0 mmHg        TR Peak grad:   49.3 mmHg MV Mean grad: 2.0 mmHg        TR Vmax:        351.00 cm/s MV Vmax:      1.00 m/s MV Vmean:     58.9 cm/s       SHUNTS MR Peak grad:    86.5 mmHg    Systemic Diam: 2.10 cm MR Mean grad:    46.0 mmHg MR Vmax:         465.00 cm/s MR Vmean:        307.0 cm/s MR PISA:         5.09 cm MR PISA Eff ROA: 42 mm MR PISA Radius:  0.90 cm Fabrice Dyal McleanMD Electronically signed by Franki Monte Signature Date/Time: 03/01/2022/4:02:11 PM    Final      Medications:     Scheduled Medications:  atorvastatin  80 mg Oral q1800  busPIRone  10 mg Oral BID   ezetimibe  10 mg Oral Daily   FLUoxetine  20 mg Oral Daily   fluticasone  1 spray Each Nare Daily   heparin  5,000 Units Subcutaneous Q8H   loratadine  10 mg Oral Daily   mometasone-formoterol  2 puff Inhalation BID   montelukast  10 mg Oral QHS   pantoprazole  40 mg Oral Daily   polyethylene glycol-electrolytes  4,000 mL Oral Once   sodium chloride flush  3 mL Intravenous  Q12H   sodium chloride flush  3 mL Intravenous Q12H   sodium chloride flush  3 mL Intravenous Q12H   spironolactone  12.5 mg Oral Daily    Infusions:  sodium chloride     sodium chloride      PRN Medications: sodium chloride, sodium chloride, acetaminophen, albuterol, ondansetron (ZOFRAN) IV, mouth rinse, sodium chloride flush, sodium chloride flush    Patient Profile   72 y/o female w/ h/o chronic diastolic heart failure, HTN, HLD, h/o TIA, GERD and asthma. Former smoker (quit 72 y/o) AHF asked to see for acute on chronic systolic heart failure/ pulmonary HTN in setting of mitral regurgitation.   Assessment/Plan   1. Acute on chronic systolic CHF: Echo in 0/25 showed low normal EF 50-55%, no significant MR.  Echo in 1/24 showed EF 40-45%, mild RV dilation, moderate pulmonary HTN, mod-severe MR and moderate-severe TR.  RHC/LHC with elevated left and right heart filling pressures, pulmonary venous hypertension, preserved cardiac output.  Nonischemic cardiomyopathy.  Cause is uncertain, ?valvular disease. TEE done 1/12 showed EF 30-35%, mild RV dysfunction, severe MR, moderate TR, moderate AI.  She has diuresed well and no longer looks volume overloaded. SBP lower 90s-100s with creatinine higher at 1.5 this morning.  - No torsemide today, will likely start torsemide 20 mg daily tomorrow.  - Hold Entresto for now (had dose today already).  - Spironolactone 12.5 daily.  - Will need to review cardiac MRI.  Would consider possibility of cardiac sarcoidosis with associated pulmonary sarcoidosis given the mediastinal adenopathy.  2. Valvular heart disease: TEE today showed severe MR.  The valve is abnormal with thickening of the leaflet tips and poor coapatation, ?underlying rheumatic or inflammatory etiology though not classic appearance for rheumatic heart disease and no mitral stenosis.  The aortic valve is also abnormal with thickening and doming.  - Will have her assessed by structural heart  team (discussed with Dr. Burt Knack), will need to decide Mitraclip versus surgery.  With low EF and also ongoing workup for mediastinal lymphadenopathy, surgery would be high risk.  3. Pulmonary hypertension: Group 2 pulmonary venous hypertension.  4. Dysphagia: Dr. Benson Norway saw today, holding Plavix and will have EGD tomorrow.  May want to hold off on colonoscopy if BP remains marginal, can make decision tomorrow.  5. H/o TIA/CVA: She has been on Plavix long-term.  On hold pre-EGD.   6. Pulmonary: Prior smoker with COPD. PET-CT showed hypermetabolic mediastinal LAN, either lymphoproliferative disorder or primary bronchogenic CA considered.  I also wonder if this could represent active sarcoidosis.  - Needs EBUS, has seen Dr. Valeta Harms.  He is not going to do this admission (discussed with him), he will seen in followup after discharge.   7. AKI: Creatinine up to 1.5 in setting of drop in BP overnight.   - Hold Entresto and torsemide for now.  - Encouraged hydration.  8. SVT: Episode of SVT last night, required adenosine to break. No prior history.   -  Will start beta blocker eventually but hold off today with low BP.   Mobilize.   Marca Ancona 03/02/2022 10:19 AM

## 2022-03-02 NOTE — Consult Note (Signed)
Reason for Consult: Dysphagia and history of colonic polyp Referring Physician: [facility]  Asa Saunas HPI: This is a 72 year old female admitted for further evaluation of CHF and Pulmonary HTN.  She was identified to have an acute on chronic systolic CHF and a new finding of severe MR as well as TR.  This was identified as part of a work up for the pending EGD/colonoscopy.  She reported exertional dyspnea with mild exertion.  Her inpatient echo showed that her EF declined to 40-45%.  The cardiac catherization did not show any significant CAD.  Late last year she was complaining about solid food dysphagia and she estimated an 8 lbs weight loss.  Since that time her weight dropped down from 70 kg down to her current 60 kg.  In 2016 her colonoscopy was positive for a small adenoma.  A lung nodule was found on imaging last year which lead to further work up.  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 Past Medical History:  Diagnosis Date   Anxiety    Arthritis    "right knee" (05/26/2014)   Asthma    Chronic lower back pain    Dysrhythmia    irregular heart beat   GERD (gastroesophageal reflux disease)    HLD (hyperlipidemia)    Hypertension    Pneumonia    Stroke [facility])    TIA - ?2021   TIA (transient ischemic attack) 04/2018    Past Surgical History:  Procedure Laterality Date   BRONCHIAL BIOPSY  07/10/2021   Procedure: BRONCHIAL BIOPSIES;  Surgeon: Josephine Igo, DO;  Location: [facility] ENDOSCOPY;  Service: Pulmonary;;   BRONCHIAL BRUSHINGS  07/10/2021   Procedure: BRONCHIAL BRUSHINGS;  Surgeon: Josephine Igo, DO;  Location: [facility] ENDOSCOPY;  Service: Pulmonary;;   BRONCHIAL NEEDLE ASPIRATION BIOPSY  07/10/2021   Procedure: BRONCHIAL NEEDLE ASPIRATION BIOPSIES;  Surgeon: Josephine Igo, DO;  Location: [facility] ENDOSCOPY;  Service: Pulmonary;;   BRONCHIAL WASHINGS  07/10/2021   Procedure: BRONCHIAL WASHINGS;  Surgeon: Josephine Igo, DO;   Location: [facility] ENDOSCOPY;  Service: Pulmonary;;   ESOPHAGOGASTRODUODENOSCOPY N/A 05/27/2014   Procedure: ESOPHAGOGASTRODUODENOSCOPY (EGD);  Surgeon: Jeani Hawking, MD;  Location: [facility] ENDOSCOPY;  Service: Endoscopy;  Laterality: N/A;   FRACTURE SURGERY     PATELLA FRACTURE SURGERY Right ~ 2009   RIGHT/LEFT HEART CATH AND CORONARY ANGIOGRAPHY N/A 02/28/2022   Procedure: RIGHT/LEFT HEART CATH AND CORONARY ANGIOGRAPHY;  Surgeon: Corky Crafts, MD;  Location: [facility] INVASIVE CV LAB;  Service: Cardiovascular;  Laterality: N/A;   TONSILLECTOMY  ~ 1970   VAGINAL HYSTERECTOMY  1980's   VIDEO BRONCHOSCOPY WITH RADIAL ENDOBRONCHIAL ULTRASOUND  07/10/2021   Procedure: RADIAL ENDOBRONCHIAL ULTRASOUND;  Surgeon: Josephine Igo, DO;  Location: [facility] ENDOSCOPY;  Service: Pulmonary;;    Family History  Problem Relation Age of Onset   Hypertension Mother    Hypertension Father    Colon cancer Father    Hypertension Sister     Social History:  reports that she quit smoking about 23 years ago. Her smoking use included cigarettes. She has a 15.00 pack-year smoking history. She has never used smokeless tobacco. She reports current alcohol use. She reports that she does not use drugs.  Allergies:  Allergies  Allergen Reactions   Amoxicillin-Pot Clavulanate Itching   Other Other (See Comments)    08/06/2018 -    06/05/2016 -    09/15/2014 -    06/16/2014 -  Medications: Scheduled:  atorvastatin  80 mg Oral q1800   busPIRone  10 mg Oral BID   ezetimibe  10 mg Oral Daily   FLUoxetine  20 mg Oral Daily   fluticasone  1 spray Each Nare Daily   heparin  5,000 Units Subcutaneous Q8H   loratadine  10 mg Oral Daily   mometasone-formoterol  2 puff Inhalation BID   montelukast  10 mg Oral QHS   pantoprazole  40 mg Oral Daily   sacubitril-valsartan  1 tablet Oral BID   sodium chloride flush  3 mL Intravenous Q12H   sodium chloride flush  3 mL Intravenous Q12H   sodium chloride flush  3 mL Intravenous Q12H    spironolactone  12.5 mg Oral Daily   torsemide  20 mg Oral Daily   Continuous:  sodium chloride     sodium chloride      Results for orders placed or performed during the hospital encounter of 02/28/22 (from the past 24 hour(s))  Glucose, capillary     Status: Abnormal   Collection Time: 03/01/22 10:53 PM  Result Value Ref Range   Glucose-Capillary 123 (H) 70 - 99 mg/dL  Basic metabolic panel     Status: Abnormal   Collection Time: 03/01/22 11:19 PM  Result Value Ref Range   Sodium 135 135 - 145 mmol/L   Potassium 3.3 (L) 3.5 - 5.1 mmol/L   Chloride 97 (L) 98 - 111 mmol/L   CO2 27 22 - 32 mmol/L   Glucose, Bld 138 (H) 70 - 99 mg/dL   BUN 19 8 - 23 mg/dL   Creatinine, Ser 1.51 (H) 0.44 - 1.00 mg/dL   Calcium 9.0 8.9 - 10.3 mg/dL   GFR, Estimated 37 (L) >60 mL/min   Anion gap 11 5 - 15  Magnesium     Status: Abnormal   Collection Time: 03/01/22 11:19 PM  Result Value Ref Range   Magnesium 1.4 (L) 1.7 - 2.4 mg/dL  Lactic acid, plasma     Status: None   Collection Time: 03/01/22 11:19 PM  Result Value Ref Range   Lactic Acid, Venous 1.9 0.5 - 1.9 mmol/L  Basic metabolic panel     Status: Abnormal   Collection Time: 03/02/22 12:31 AM  Result Value Ref Range   Sodium 135 135 - 145 mmol/L   Potassium 3.1 (L) 3.5 - 5.1 mmol/L   Chloride 99 98 - 111 mmol/L   CO2 21 (L) 22 - 32 mmol/L   Glucose, Bld 136 (H) 70 - 99 mg/dL   BUN 20 8 - 23 mg/dL   Creatinine, Ser 1.51 (H) 0.44 - 1.00 mg/dL   Calcium 8.7 (L) 8.9 - 10.3 mg/dL   GFR, Estimated 37 (L) >60 mL/min   Anion gap 15 5 - 15     ECHO TEE  Result Date: 03/01/2022    TRANSESOPHOGEAL ECHO REPORT   Patient Name:   Shannon [name] Date of Exam: 03/01/2022 Medical Rec #:  [id number]          Height:       66.0 in Accession #:    [id number]         Weight:       138.4 lb Date of Birth:  November 08, 1950          BSA:          1.710 m Patient Age:    72 years           BP:  123/72 mmHg Patient Gender: F                  HR:            117 bpm. Exam Location:  Inpatient Procedure: 3D Echo, Cardiac Doppler, Color Doppler and Transesophageal Echo Indications:     I34.0 Nonrheumatic mitral (valve) insufficiency  History:         Patient has prior history of Echocardiogram examinations, most                  recent 02/21/2022. CHF, Abnormal ECG, COPD, Mitral Valve Disease                  and Aortic Valve Disease, Signs/Symptoms:Chest Pain; Risk                  Factors:Hypertension, Dyslipidemia and Sleep Apnea. ETOH.  Sonographer:     Sheralyn Boatman RDCS Referring Phys:  [id number] ALMA L DIAZ Diagnosing Phys: Wilfred Lacy PROCEDURE: After discussion of the risks and benefits of a TEE, an informed consent was obtained from the patient. The transesophogeal probe was passed without difficulty through the esophogus of the patient. Imaged were obtained with the patient in a left lateral decubitus position. Sedation performed by different physician. The patient was monitored while under deep sedation. Anesthestetic sedation was provided intravenously by Anesthesiology: 253mg  of Propofol, 100mg  of Lidocaine. The patient's vital signs; including heart rate, blood pressure, and oxygen saturation; remained stable throughout the procedure. Supplementary images were obtained from transthoracic windows as indicated to answer the clinical question. The patient developed no complications during the procedure.  IMPRESSIONS  1. Left ventricular ejection fraction, by estimation, is 30 to 35%. The left ventricle has moderately decreased function. The left ventricle demonstrates global hypokinesis. The left ventricular internal cavity size was mildly dilated. There is mild concentric left ventricular hypertrophy.  2. Right ventricular systolic function is mildly reduced. The right ventricular size is mildly enlarged. There is moderately elevated pulmonary artery systolic pressure. The estimated right ventricular systolic pressure is 52.3 mmHg.  3. Left atrial size was  severely dilated. No left atrial/left atrial appendage thrombus was detected.  4. Right atrial size was mildly dilated.  5. No ASD or PFO by color doppler.  6. The mitral valve is abnormal. Severe mitral valve regurgitation. The mitral valve is thickened at the leaflet tips with poor coaptation. There is restriction of the posterior leaflet. 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 There are 2 main jets of mitral regurgitation. PISA measured for one jet, ERO 0.32 cm^2. The total vena contracta area was 0.58 cm^2. There was systolic flow reversal in the pulmonary vein doppler pattern. No evidence of mitral stenosis.  7. The tricuspid valve is abnormal. Tricuspid valve regurgitation is moderate.  8. The aortic valve tips appeared thickened and domed. The aortic valve is tricuspid. Aortic valve regurgitation is moderate. No aortic stenosis is present.  9. The inferior vena cava is normal in size with greater than 50% respiratory variability, suggesting right atrial pressure of 3 mmHg. FINDINGS  Left Ventricle: Left ventricular ejection fraction, by estimation, is 30 to 35%. The left ventricle has moderately decreased function. The left ventricle demonstrates global hypokinesis. The left ventricular internal cavity size was mildly dilated. There is mild concentric left ventricular hypertrophy. Right Ventricle: The right ventricular size is mildly enlarged. No increase in right ventricular wall thickness. Right ventricular systolic function is mildly reduced. There is moderately  elevated pulmonary artery systolic pressure. The tricuspid regurgitant velocity is 3.51 m/s, and with an assumed right atrial pressure of 3 mmHg, the estimated right ventricular systolic pressure is 52.3 mmHg. Left Atrium: Left atrial size was severely dilated. No left atrial/left atrial appendage thrombus was detected. Right Atrium: Right atrial size was mildly dilated. Pericardium: Trivial pericardial  effusion is present. Mitral Valve: The mitral valve is abnormal. Severe mitral valve regurgitation. No evidence of mitral valve stenosis. MV peak gradient, 4.0 mmHg. The mean mitral valve gradient is 2.0 mmHg. Tricuspid Valve: The tricuspid valve is abnormal. Tricuspid valve regurgitation is moderate. Aortic Valve: The aortic valve tips appeared thickened and domed. The aortic valve is tricuspid. Aortic valve regurgitation is moderate. No aortic stenosis is present. Pulmonic Valve: The pulmonic valve was normal in structure. Pulmonic valve regurgitation is trivial. Aorta: The aortic root is normal in size and structure. Venous: The inferior vena cava is normal in size with greater than 50% respiratory variability, suggesting right atrial pressure of 3 mmHg. IAS/Shunts: No ASD or PFO by color doppler. Additional Comments: Spectral Doppler performed. LEFT VENTRICLE PLAX 2D LVIDd:         5.60 cm LVIDs:         4.80 cm LV PW:         1.30 cm LV IVS:        0.80 cm LVOT diam:     2.10 cm LVOT Area:     3.46 cm  IVC IVC diam: 1.00 cm MITRAL VALVE                  TRICUSPID VALVE MV Peak grad: 4.0 mmHg        TR Peak grad:   49.3 mmHg MV Mean grad: 2.0 mmHg        TR Vmax:        351.00 cm/s MV Vmax:      1.00 m/s MV Vmean:     58.9 cm/s       SHUNTS MR Peak grad:    86.5 mmHg    Systemic Diam: 2.10 cm MR Mean grad:    46.0 mmHg MR Vmax:         465.00 cm/s MR Vmean:        307.0 cm/s MR PISA:         5.09 cm MR PISA Eff ROA: 42 mm MR PISA Radius:  0.90 cm Dalton McleanMD Electronically signed by Wilfred Lacy Signature Date/Time: 03/01/2022/4:02:11 PM    Final    CARDIAC CATHETERIZATION  Result Date: 02/28/2022   Prox RCA lesion is 10% stenosed.   LV end diastolic pressure is moderately elevated.   There is no aortic valve stenosis.   No angiographically apparent left sided coronary artery disease.   Hemodynamic findings consistent with moderate pulmonary hypertension.   Aortic saturation 83%, PA saturation 48%,  mean RA pressure 18 mmHg, PA pressure 50/28, mean PA pressure 37 mmHg, pulmonary capillary wedge pressure 35/36, mean pulmonary capillary wedge pressure 30 mmHg, cardiac output 3.92 L/min, cardiac index 2.27. Volume overloaded based on right heart pressures.  Will admit for IV diuresis and further workup of her mitral regurgitation.  She will also need EGD.  She is currently receiving clopidogrel.  This will likely need to be held prior to EGD.    ROS:  As stated above in the HPI otherwise negative.  Blood pressure 97/60, pulse 94, temperature 97.8 F (36.6 C), temperature source Oral, resp. rate 18, height 5'  6" (1.676 m), weight 60.2 kg, SpO2 98 %.    PE: Gen: NAD, Alert and Oriented HEENT:  [place]/AT, EOMI Neck: Supple, no LAD Lungs: CTA Bilaterally CV: RRR without M/G/R ABD: Soft, NTND, +BS Ext: No C/C/E  Assessment/Plan: 1) Dysphagia. 2) Weight loss. 3) Mediastinal LAD. 4) CHF. 5) MR/TR. 6) Pulm HTN.   The patient was cleared by Cardiology to have the EGD/colonoscopy as an inpatient.  She was supposed to have the procedure yesterday, but came in to be further evaluated for her cardiac issues.  She is off of Plavix.  Plan: 1) EGD with dilation and colonoscopy tomorrow.  Demetria Iwai D 03/02/2022, 8:34 AM

## 2022-03-02 NOTE — Plan of Care (Signed)
Pt on clear liquid diet and NPO at midnight for procedure tomorrow. Pt started on bowel prep at 1700. Normal saline drip started.  Problem: Education: Goal: Understanding of CV disease, CV risk reduction, and recovery process will improve Outcome: Progressing Goal: Individualized Educational Video(s) Outcome: Progressing   Problem: Activity: Goal: Ability to return to baseline activity level will improve Outcome: Progressing   Problem: Cardiovascular: Goal: Ability to achieve and maintain adequate cardiovascular perfusion will improve Outcome: Progressing Goal: Vascular access site(s) Level 0-1 will be maintained Outcome: Progressing   Problem: Health Behavior/Discharge Planning: Goal: Ability to safely manage health-related needs after discharge will improve Outcome: Progressing   Problem: Education: Goal: Knowledge of General Education information will improve Description: Including pain rating scale, medication(s)/side effects and non-pharmacologic comfort measures Outcome: Progressing   Problem: Health Behavior/Discharge Planning: Goal: Ability to manage health-related needs will improve Outcome: Progressing   Problem: Clinical Measurements: Goal: Ability to maintain clinical measurements within normal limits will improve Outcome: Progressing Goal: Will remain free from infection Outcome: Progressing Goal: Diagnostic test results will improve Outcome: Progressing Goal: Respiratory complications will improve Outcome: Progressing Goal: Cardiovascular complication will be avoided Outcome: Progressing   Problem: Activity: Goal: Risk for activity intolerance will decrease Outcome: Progressing   Problem: Nutrition: Goal: Adequate nutrition will be maintained Outcome: Progressing   Problem: Coping: Goal: Level of anxiety will decrease Outcome: Progressing   Problem: Elimination: Goal: Will not experience complications related to bowel motility Outcome:  Progressing Goal: Will not experience complications related to urinary retention Outcome: Progressing   Problem: Pain Managment: Goal: General experience of comfort will improve Outcome: Progressing   Problem: Safety: Goal: Ability to remain free from injury will improve Outcome: Progressing   Problem: Skin Integrity: Goal: Risk for impaired skin integrity will decrease Outcome: Progressing

## 2022-03-02 NOTE — Progress Notes (Addendum)
   03/01/22 2300 03/01/22 2308  Assess: MEWS Score  BP  --  (!) 80/56  MAP (mmHg)  --  65  ECG Heart Rate (!) (S)  184 (!) 115  Assess: MEWS Score  MEWS Temp 0 0  MEWS Systolic 1 2  MEWS Pulse 3 2  MEWS RR 0 0  MEWS LOC 0 0  MEWS Score 4 4  MEWS Score Color Red Red  Escalate  MEWS: Escalate Red: discuss with charge nurse/RN and provider, consider discussing with RRT  --   Notify: Charge Nurse/RN  Name of Charge Nurse/RN Notified Karen, RN  --   Date Charge Nurse/RN Notified 03/01/22  --   Time Charge Nurse/RN Notified 2239  --   Provider Notification  Provider Name/Title Dr, Marcelle Smiling  --   Date Provider Notified 03/01/22  --   Time Provider Notified 2245  --   Method of Notification Page  --   Notification Reason New onset of dysrhythmia  --   Provider response At bedside;En route  --   Date of Provider Response 03/01/22  --   Time of Provider Response 2250  --   Notify: Rapid Response  Name of Rapid Response RN Notified Mindy, RN  --   Date Rapid Response Notified 03/01/22  --   Time Rapid Response Notified 2245  --   Document  Patient Outcome Stabilized after interventions  --   Assess: SIRS CRITERIA  SIRS Temperature  0 0  SIRS Pulse 1 1  SIRS Respirations  0 0  SIRS WBC 0 0  SIRS Score Sum  1 1   CMT called to report pt HR 185 sustained in the 180's, RN immediately arrived to room pt awaken from sleeping, asymptomatic then later begin to feel dizzy and light-headed. VS noted and HR maintained in rapid rate after encouraging pt to cough and hold breath and bare down similar to vagal response all unsuccessful, CN called , RR-RN called and MD page immediately and all arrive at bedside. Pt remain alert and oriented during the entire event. Adenosine administered per ACLS protocol by RR-RN and MD per noted time as recorded. Pt remained alert and tolerated process and finally converted to NSR/ST 90 to 100's. Wil continue to monitor pt and VS every hour.   Pt states,  Paraphrasing pt statement: "these events happen at home  when it does, she lie down and wait for symptoms to past and now she understands what is happening."  SRP, RN

## 2022-03-03 ENCOUNTER — Inpatient Hospital Stay (HOSPITAL_COMMUNITY): Payer: Medicare HMO | Admitting: Certified Registered"

## 2022-03-03 ENCOUNTER — Encounter (HOSPITAL_COMMUNITY)
Admission: RE | Disposition: A | Discharge status: Home / Self Care | Payer: Self-pay | Source: Ambulatory Visit | Attending: Cardiology

## 2022-03-03 ENCOUNTER — Encounter (HOSPITAL_COMMUNITY): Payer: Self-pay | Admitting: Cardiology

## 2022-03-03 DIAGNOSIS — J449 Chronic obstructive pulmonary disease, unspecified: Secondary | ICD-10-CM

## 2022-03-03 DIAGNOSIS — K229 Disease of esophagus, unspecified: Secondary | ICD-10-CM | POA: Diagnosis not present

## 2022-03-03 DIAGNOSIS — I5043 Acute on chronic combined systolic (congestive) and diastolic (congestive) heart failure: Secondary | ICD-10-CM | POA: Diagnosis not present

## 2022-03-03 DIAGNOSIS — Z86010 Personal history of colonic polyps: Secondary | ICD-10-CM

## 2022-03-03 DIAGNOSIS — Z87891 Personal history of nicotine dependence: Secondary | ICD-10-CM | POA: Diagnosis not present

## 2022-03-03 HISTORY — PX: ESOPHAGOGASTRODUODENOSCOPY (EGD) WITH PROPOFOL: SHX5813

## 2022-03-03 HISTORY — PX: SAVORY DILATION: SHX5439

## 2022-03-03 HISTORY — PX: COLONOSCOPY WITH PROPOFOL: SHX5780

## 2022-03-03 LAB — BASIC METABOLIC PANEL
Anion gap: 13 (ref 5–15)
BUN: 19 mg/dL (ref 8–23)
CO2: 23 mmol/L (ref 22–32)
Calcium: 8.8 mg/dL — ABNORMAL LOW (ref 8.9–10.3)
Chloride: 101 mmol/L (ref 98–111)
Creatinine, Ser: 1.31 mg/dL — ABNORMAL HIGH (ref 0.44–1.00)
GFR, Estimated: 43 mL/min — ABNORMAL LOW (ref >60)
Glucose, Bld: 124 mg/dL — ABNORMAL HIGH (ref 70–99)
Potassium: 3.5 mmol/L (ref 3.5–5.1)
Sodium: 137 mmol/L (ref 135–145)

## 2022-03-03 SURGERY — ESOPHAGOGASTRODUODENOSCOPY (EGD) WITH PROPOFOL
Anesthesia: Monitor Anesthesia Care

## 2022-03-03 MED ORDER — LACTATED RINGERS IV SOLN: INTRAVENOUS | Status: DC | PRN

## 2022-03-03 MED ORDER — CLOPIDOGREL BISULFATE 75 MG PO TABS
75.0000 mg | ORAL_TABLET | Freq: Every day | ORAL | Status: DC
  Administered 2022-03-03 – 2022-03-04 (×2): 75 mg via ORAL
  Filled 2022-03-03 (×2): qty 1

## 2022-03-03 MED ORDER — LOSARTAN POTASSIUM 25 MG PO TABS
12.5000 mg | ORAL_TABLET | Freq: Every day | ORAL | Status: DC
  Administered 2022-03-03 – 2022-03-04 (×2): 12.5 mg via ORAL
  Filled 2022-03-03 (×2): qty 1

## 2022-03-03 MED ORDER — PROPOFOL 10 MG/ML IV BOLUS
INTRAVENOUS | Status: DC | PRN
  Administered 2022-03-03 (×2): 10 mg via INTRAVENOUS

## 2022-03-03 MED ORDER — DAPAGLIFLOZIN PROPANEDIOL 10 MG PO TABS
10.0000 mg | ORAL_TABLET | Freq: Every day | ORAL | Status: DC
  Administered 2022-03-03 – 2022-03-04 (×2): 10 mg via ORAL
  Filled 2022-03-03 (×2): qty 1

## 2022-03-03 MED ORDER — POTASSIUM CHLORIDE CRYS ER 20 MEQ PO TBCR
40.0000 meq | EXTENDED_RELEASE_TABLET | Freq: Once | ORAL | Status: AC
  Administered 2022-03-03: 40 meq via ORAL
  Filled 2022-03-03: qty 2

## 2022-03-03 MED ORDER — PHENYLEPHRINE 80 MCG/ML (10ML) SYRINGE FOR IV PUSH (FOR BLOOD PRESSURE SUPPORT)
PREFILLED_SYRINGE | INTRAVENOUS | Status: DC | PRN
  Administered 2022-03-03 (×3): 80 ug via INTRAVENOUS

## 2022-03-03 MED ORDER — FLUCONAZOLE 200 MG PO TABS
200.0000 mg | ORAL_TABLET | Freq: Every day | ORAL | Status: DC
  Administered 2022-03-03 – 2022-03-04 (×2): 200 mg via ORAL
  Filled 2022-03-03 (×2): qty 1

## 2022-03-03 MED ORDER — LIDOCAINE 2% (20 MG/ML) 5 ML SYRINGE
INTRAMUSCULAR | Status: DC | PRN
  Administered 2022-03-03: 40 mg via INTRAVENOUS

## 2022-03-03 MED ORDER — PROPOFOL 500 MG/50ML IV EMUL
INTRAVENOUS | Status: DC | PRN
  Administered 2022-03-03: 75 ug/kg/min via INTRAVENOUS

## 2022-03-03 SURGICAL SUPPLY — 25 items

## 2022-03-03 NOTE — Anesthesia Preprocedure Evaluation (Signed)
Anesthesia Evaluation  Patient identified by MRN, date of birth, ID band Patient awake    Reviewed: Allergy & Precautions, H&P , NPO status , Patient's Chart, lab work & pertinent test results  Airway Mallampati: II   Neck ROM: full    Dental   Pulmonary asthma , sleep apnea , COPD, former smoker   breath sounds clear to auscultation       Cardiovascular hypertension, + dysrhythmias  Rhythm:regular Rate:Normal     Neuro/Psych  PSYCHIATRIC DISORDERS Anxiety     CVA    GI/Hepatic ,GERD  ,,  Endo/Other    Renal/GU Renal InsufficiencyRenal disease     Musculoskeletal  (+) Arthritis ,    Abdominal   Peds  Hematology   Anesthesia Other Findings   Reproductive/Obstetrics                             Anesthesia Physical Anesthesia Plan  ASA: 3  Anesthesia Plan: MAC   Post-op Pain Management:    Induction: Intravenous  PONV Risk Score and Plan: 2 and Propofol infusion and Treatment may vary due to age or medical condition  Airway Management Planned: Nasal Cannula  Additional Equipment:   Intra-op Plan:   Post-operative Plan:   Informed Consent: I have reviewed the patients History and Physical, chart, labs and discussed the procedure including the risks, benefits and alternatives for the proposed anesthesia with the patient or authorized representative who has indicated his/her understanding and acceptance.     Dental advisory given  Plan Discussed with: CRNA, Anesthesiologist and Surgeon  Anesthesia Plan Comments:        Anesthesia Quick Evaluation

## 2022-03-03 NOTE — Anesthesia Postprocedure Evaluation (Signed)
Anesthesia Post Note  Patient: Shannon [name]  Procedure(s) Performed: ESOPHAGOGASTRODUODENOSCOPY (EGD) WITH PROPOFOL COLONOSCOPY WITH PROPOFOL SAVORY DILATION     Patient location during evaluation: PACU Anesthesia Type: MAC Level of consciousness: awake and alert Pain management: pain level controlled Vital Signs Assessment: post-procedure vital signs reviewed and stable Respiratory status: spontaneous breathing, nonlabored ventilation, respiratory function stable and patient connected to nasal cannula oxygen Cardiovascular status: stable and blood pressure returned to baseline Postop Assessment: no apparent nausea or vomiting Anesthetic complications: no   No notable events documented.  Last Vitals:  Vitals:   03/03/22 1200 03/03/22 1300  BP: (!) 96/57 117/62  Pulse: 92   Resp:    Temp:    SpO2: 93%     Last Pain:  Vitals:   03/03/22 1300  TempSrc:   PainSc: 0-No pain                 [name] S

## 2022-03-03 NOTE — Transfer of Care (Signed)
Immediate Anesthesia Transfer of Care Note  Patient: Shannon [name]  Procedure(s) Performed: ESOPHAGOGASTRODUODENOSCOPY (EGD) WITH PROPOFOL COLONOSCOPY WITH PROPOFOL SAVORY DILATION  Patient Location: PACU  Anesthesia Type:MAC  Level of Consciousness: awake, alert , oriented, and patient cooperative  Airway & Oxygen Therapy: Patient Spontanous Breathing  Post-op Assessment: Report given to RN and Post -op Vital signs reviewed and stable  Post vital signs: Reviewed and stable  Last Vitals:  Vitals Value Taken Time  BP 91/51 03/03/22 0936  Temp    Pulse 87 03/03/22 0937  Resp 27 03/03/22 0937  SpO2 92 % 03/03/22 0937  Vitals shown include unvalidated device data.  Last Pain:  Vitals:   03/03/22 0936  TempSrc:   PainSc: 0-No pain         Complications: No notable events documented.

## 2022-03-03 NOTE — Op Note (Signed)
[facility] Patient Name: Shannon [name] Procedure Date : 03/03/2022 MRN: [id number] Attending MD: Carol Ada , MD, [id number] Date of Birth: 05-Aug-1950 CSN: [id number] Age: 72 Admit Type: Inpatient Procedure:                Colonoscopy Indications:              High risk colon cancer surveillance: Personal                            history of colonic polyps Providers:                Carol Ada, MD, Grace Isaac, RN, Gloris Ham, Technician Referring MD:              Medicines:                 Complications:            No immediate complications. Estimated Blood Loss:     Estimated blood loss: none. Procedure:                Pre-Anesthesia Assessment:                           - Prior to the procedure, a History and Physical                            was performed, and patient medications and                            allergies were reviewed. The patient's tolerance of                            previous anesthesia was also reviewed. The risks                            and benefits of the procedure and the sedation                            options and risks were discussed with the patient.                            All questions were answered, and informed consent                            was obtained. Prior Anticoagulants: The patient has                            taken Plavix (clopidogrel), last dose was 5 days                            prior to procedure. ASA Grade Assessment: III - A  patient with severe systemic disease. After                            reviewing the risks and benefits, the patient was                            deemed in satisfactory condition to undergo the                            procedure.                           - Sedation was administered by an anesthesia                            professional. Deep sedation was attained.                           After  obtaining informed consent, the colonoscope                            was passed under direct vision. Throughout the                            procedure, the patient's blood pressure, pulse, and                            oxygen saturations were monitored continuously. The                            PCF-190TL ([id number]) Olympus colonoscope was                            introduced through the anus and advanced to the the                            cecum, identified by appendiceal orifice and                            ileocecal valve. The colonoscopy was performed                            without difficulty. The patient tolerated the                            procedure well. The quality of the bowel                            preparation was evaluated using the BBPS [facility]                            Bowel Preparation Scale) with scores of: Right                            Colon =  3 (entire mucosa seen well with no residual                            staining, small fragments of stool or opaque                            liquid), Transverse Colon = 3 (entire mucosa seen                            well with no residual staining, small fragments of                            stool or opaque liquid) and Left Colon = 3 (entire                            mucosa seen well with no residual staining, small                            fragments of stool or opaque liquid). The total                            BBPS score equals 9. The quality of the bowel                            preparation was good. The ileocecal valve,                            appendiceal orifice, and rectum were photographed. Scope In: 9:21:12 AM Scope Out: 9:29:22 AM Scope Withdrawal Time: 0 hours 5 minutes 51 seconds  Total Procedure Duration: 0 hours 8 minutes 10 seconds  Findings:      The entire examined colon appeared normal. Impression:               - The entire examined colon is normal.                            - No specimens collected. Recommendation:           - Patient has a contact number available for                            emergencies. The signs and symptoms of potential                            delayed complications were discussed with the                            patient. Return to normal activities tomorrow.                            Written discharge instructions were provided to the                            patient.                           -  Resume previous diet.                           - Continue present medications.                           - Repeat colonoscopy is not recommended for                            surveillance.                           - Okay to resume Plavix. Procedure Code(s):        --- Professional ---                           [phone], Colonoscopy, flexible; diagnostic, including                            collection of specimen(s) by brushing or washing,                            when performed (separate procedure) Diagnosis Code(s):        --- Professional ---                           Z86.010, Personal history of colonic polyps CPT copyright 2022 [employer]. All rights reserved. The codes documented in this report are preliminary and upon coder review may  be revised to meet current compliance requirements. Jeani Hawking, MD Jeani Hawking, MD 03/03/2022 9:36:45 AM This report has been signed electronically. Number of Addenda: 0

## 2022-03-03 NOTE — Op Note (Signed)
[facility] Patient Name: Shannon [name] Procedure Date : 03/03/2022 MRN: [id number] Attending MD: Carol Ada , MD, [id number] Date of Birth: January 14, 1951 CSN: [id number] Age: 72 Admit Type: Inpatient Procedure:                Upper GI endoscopy Indications:              Dysphagia Providers:                Carol Ada, MD, Grace Isaac, RN, Gloris Ham, Technician Referring MD:              Medicines:                Propofol per Anesthesia Complications:            No immediate complications. Estimated Blood Loss:     Estimated blood loss: none. Procedure:                Pre-Anesthesia Assessment:                           - Prior to the procedure, a History and Physical                            was performed, and patient medications and                            allergies were reviewed. The patient's tolerance of                            previous anesthesia was also reviewed. The risks                            and benefits of the procedure and the sedation                            options and risks were discussed with the patient.                            All questions were answered, and informed consent                            was obtained. Prior Anticoagulants: The patient has                            taken no anticoagulant or antiplatelet agents. ASA                            Grade Assessment: III - A patient with severe                            systemic disease. After reviewing the risks and  benefits, the patient was deemed in satisfactory                            condition to undergo the procedure.                           - Sedation was administered by an anesthesia                            professional. Deep sedation was attained.                           After obtaining informed consent, the endoscope was                            passed under direct vision. Throughout the                             procedure, the patient's blood pressure, pulse, and                            oxygen saturations were monitored continuously. The                            PCF-190TL ([id number]) Olympus colonoscope was                            introduced through the mouth, and advanced to the                            second part of duodenum. The upper GI endoscopy was                            accomplished without difficulty. The patient                            tolerated the procedure well. Scope In: Scope Out: Findings:      No endoscopic abnormality was evident in the esophagus to explain the       patient's complaint of dysphagia. It was decided, however, to proceed       with dilation of the entire esophagus. A guidewire was placed and the       scope was withdrawn. Dilation was performed with a Savary dilator with       no resistance at 18 mm. The dilation site was examined following       endoscope reinsertion and showed no change.      Patchy, white plaques were found in the middle third of the esophagus       and in the lower third of the esophagus.      The stomach was normal.      The examined duodenum was normal. Impression:               - No endoscopic esophageal abnormality to explain  patient's dysphagia. Esophagus dilated. Dilated.                           - Esophageal plaques were found, consistent with                            candidiasis.                           - Normal stomach.                           - Normal examined duodenum.                           - No specimens collected. Recommendation:           - Return patient to hospital ward for ongoing care.                           - Resume regular diet.                           - Continue present medications.                           - Fluconazole 200 mg QD. Procedure Code(s):        --- Professional ---                           [phone],  Esophagogastroduodenoscopy, flexible,                            transoral; with insertion of guide wire followed by                            passage of dilator(s) through esophagus over guide                            wire Diagnosis Code(s):        --- Professional ---                           R13.10, Dysphagia, unspecified                           K22.9, Disease of esophagus, unspecified CPT copyright 2022 American Medical Association. All rights reserved. The codes documented in this report are preliminary and upon coder review may  be revised to meet current compliance requirements. Jeani Hawking, MD Jeani Hawking, MD 03/03/2022 9:34:25 AM This report has been signed electronically. Number of Addenda: 0

## 2022-03-03 NOTE — Plan of Care (Signed)
Pt had endoscopy with colonoscopy performed today. Pt independent in room.   Problem: Education: Goal: Understanding of CV disease, CV risk reduction, and recovery process will improve Outcome: Progressing Goal: Individualized Educational Video(s) Outcome: Progressing   Problem: Activity: Goal: Ability to return to baseline activity level will improve Outcome: Progressing   Problem: Cardiovascular: Goal: Ability to achieve and maintain adequate cardiovascular perfusion will improve Outcome: Progressing Goal: Vascular access site(s) Level 0-1 will be maintained Outcome: Progressing   Problem: Health Behavior/Discharge Planning: Goal: Ability to safely manage health-related needs after discharge will improve Outcome: Progressing   Problem: Education: Goal: Knowledge of General Education information will improve Description: Including pain rating scale, medication(s)/side effects and non-pharmacologic comfort measures Outcome: Progressing   Problem: Health Behavior/Discharge Planning: Goal: Ability to manage health-related needs will improve Outcome: Progressing   Problem: Clinical Measurements: Goal: Ability to maintain clinical measurements within normal limits will improve Outcome: Progressing Goal: Will remain free from infection Outcome: Progressing Goal: Diagnostic test results will improve Outcome: Progressing Goal: Respiratory complications will improve Outcome: Progressing Goal: Cardiovascular complication will be avoided Outcome: Progressing   Problem: Activity: Goal: Risk for activity intolerance will decrease Outcome: Progressing   Problem: Nutrition: Goal: Adequate nutrition will be maintained Outcome: Progressing   Problem: Coping: Goal: Level of anxiety will decrease Outcome: Progressing   Problem: Elimination: Goal: Will not experience complications related to bowel motility Outcome: Progressing Goal: Will not experience complications related to  urinary retention Outcome: Progressing   Problem: Pain Managment: Goal: General experience of comfort will improve Outcome: Progressing   Problem: Safety: Goal: Ability to remain free from injury will improve Outcome: Progressing   Problem: Skin Integrity: Goal: Risk for impaired skin integrity will decrease Outcome: Progressing

## 2022-03-03 NOTE — Progress Notes (Signed)
Patient ID: Shannon [name], female   DOB: 04/04/1950, 72 y.o.   MRN: [id number]     Advanced Heart Failure Rounding Note  PCP-Cardiologist: Nanetta Batty, MD   Subjective:    No further SVT. SBP remains on the low side 90s-100s.  Sherryll Burger was stopped. Creatinine lower 1.5 => 1.31.   No dyspnea.   She had EGD/colonoscopy today.  Colonoscopy unremarkable.  EGD with no definite cause for dysphagia noted but esophagus dilated.  Esophageal candidiasis noted.   R/LHC 1/11: no significant CAD, only 10% pRCA narrowing, elevated R+ L heart filling pressures and marginal output, (PA saturation 48%, mean RA pressure 18 mmHg, PA pressure 50/28, mean PA pressure 37 mmHg, pulmonary capillary wedge pressure 35/36, mean pulmonary capillary wedge pressure 30 mmHg, cardiac output 3.92 L/min, cardiac index 2.27).    TEE (1/13): EF 30-35%, severe MR, moderate TR, moderate AI.   Cardiac MRI: 1.  Normal LV size with diffuse hypokinesis, EF 18%. 2.  Normal RV size with EF 25%. 3. Aortic insufficiency looks at least moderate, regurgitant fraction 49% suggests severe aortic insufficiency. Thickened leaflet tips. 4. Thickened mitral valve leaflet tips. At least moderate and probably severe mitral regurgitation. Low stroke volume introduces error to the regurgitant fraction calculation. 5. Mid wall LGE in the basal septal and possible subtle LGE in the basal lateral wall. This is not a CAD pattern, possible prior myocarditis. Cannot fully rule out cardiac sarcoidosis involvement given mediastinal lymphadenopathy. 6. Elevated extracellular volume percentage suggests increased fibrotic content in LV myocardium.  Objective:   Weight Range: 61.9 kg Body mass index is 22.03 kg/m.   Vital Signs:   Temp:  [97.7 F (36.5 C)-98.1 F (36.7 C)] 97.7 F (36.5 C) (01/14 1007) Pulse Rate:  [87-96] 89 (01/14 1007) Resp:  [17-22] 17 (01/14 1007) BP: (88-112)/(50-70) 112/59 (01/14 1007) SpO2:  [92 %-99 %] 96 %  (01/14 1007) Weight:  [61.9 kg] 61.9 kg (01/14 0500) Last BM Date : 03/03/22  Weight change: Filed Weights   03/02/22 0529 03/03/22 0035 03/03/22 0500  Weight: 60.2 kg 61.9 kg 61.9 kg    Intake/Output:   Intake/Output Summary (Last 24 hours) at 03/03/2022 1030 Last data filed at 03/03/2022 0931 Gross per 24 hour  Intake 1811.8 ml  Output 350 ml  Net 1461.8 ml      Physical Exam    General: NAD Neck: No JVD, no thyromegaly or thyroid nodule.  Lungs: Clear to auscultation bilaterally with normal respiratory effort. CV: Nondisplaced PMI.  Heart regular S1/S2, no S3/S4, 1/6 HSM apex.  No peripheral edema.   Abdomen: Soft, nontender, no hepatosplenomegaly, no distention.  Skin: Intact without lesions or rashes.  Neurologic: Alert and oriented x 3.  Psych: Normal affect. Extremities: No clubbing or cyanosis.  HEENT: Normal.   Telemetry   NSR 80s, no further SVT (Personally reviewed)    EKG    No new EKG to review  Labs    CBC Recent Labs    02/28/22 1515  WBC 4.0  HGB 12.2  HCT 37.0  MCV 91.4  PLT 305   Basic Metabolic Panel Recent Labs    68/11/57 2319 03/02/22 0031 03/03/22 0058  NA 135 135 137  K 3.3* 3.1* 3.5  CL 97* 99 101  CO2 27 21* 23  GLUCOSE 138* 136* 124*  BUN 19 20 19   CREATININE 1.51* 1.51* 1.31*  CALCIUM 9.0 8.7* 8.8*  MG 1.4*  --   --    Liver Function Tests No  results for input(s): "AST", "ALT", "ALKPHOS", "BILITOT", "PROT", "ALBUMIN" in the last 72 hours.  No results for input(s): "LIPASE", "AMYLASE" in the last 72 hours. Cardiac Enzymes No results for input(s): "CKTOTAL", "CKMB", "CKMBINDEX", "TROPONINI" in the last 72 hours.  BNP: BNP (last 3 results) No results for input(s): "BNP" in the last 8760 hours.  ProBNP (last 3 results) Recent Labs    02/26/22 1546  PROBNP 2,865*     D-Dimer No results for input(s): "DDIMER" in the last 72 hours. Hemoglobin A1C No results for input(s): "HGBA1C" in the last 72  hours. Fasting Lipid Panel No results for input(s): "CHOL", "HDL", "LDLCALC", "TRIG", "CHOLHDL", "LDLDIRECT" in the last 72 hours. Thyroid Function Tests No results for input(s): "TSH", "T4TOTAL", "T3FREE", "THYROIDAB" in the last 72 hours.  Invalid input(s): "FREET3"  Other results:   Imaging    No results found.   Medications:     Scheduled Medications:  [MAR Hold] atorvastatin  80 mg Oral q1800   [MAR Hold] busPIRone  10 mg Oral BID   [MAR Hold] ezetimibe  10 mg Oral Daily   [MAR Hold] FLUoxetine  20 mg Oral Daily   [MAR Hold] fluticasone  1 spray Each Nare Daily   [MAR Hold] heparin  5,000 Units Subcutaneous Q8H   [MAR Hold] loratadine  10 mg Oral Daily   [MAR Hold] mometasone-formoterol  2 puff Inhalation BID   [MAR Hold] montelukast  10 mg Oral QHS   [MAR Hold] pantoprazole  40 mg Oral Daily   [MAR Hold] sodium chloride flush  3 mL Intravenous Q12H   [MAR Hold] sodium chloride flush  3 mL Intravenous Q12H   [MAR Hold] sodium chloride flush  3 mL Intravenous Q12H   [MAR Hold] spironolactone  12.5 mg Oral Daily    Infusions:  [MAR Hold] sodium chloride     [MAR Hold] sodium chloride     sodium chloride Stopped (03/03/22 0800)    PRN Medications: [MAR Hold] sodium chloride, [MAR Hold] sodium chloride, [MAR Hold] acetaminophen, [MAR Hold] albuterol, [MAR Hold] ondansetron (ZOFRAN) IV, [MAR Hold] mouth rinse, [MAR Hold] sodium chloride flush, [MAR Hold] sodium chloride flush    Patient Profile   72 y/o female w/ h/o chronic diastolic heart failure, HTN, HLD, h/o TIA, GERD and asthma. Former smoker (quit 72 y/o) AHF asked to see for acute on chronic systolic heart failure/ pulmonary HTN in setting of mitral regurgitation.   Assessment/Plan   1. Acute on chronic systolic CHF: Echo in 4/09 showed low normal EF 50-55%, no significant MR.  Echo in 1/24 showed EF 40-45%, mild RV dilation, moderate pulmonary HTN, mod-severe MR and moderate-severe TR.  RHC/LHC with  elevated left and right heart filling pressures, pulmonary venous hypertension, preserved cardiac output.  Nonischemic cardiomyopathy.  Cause is uncertain, ?valvular disease. TEE done 1/12 showed EF 30-35%, mild RV dysfunction, severe MR, moderate TR, moderate AI.  Cardiac MRI 1/12 showed LV EF 18%, RV EF 25%, ?severe AI, moderate-severe MR (regurgitant fraction not likely accurate with low SV), non-coronary mid-wall LGE in the basal septal wall.  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  She has diuresed well and no longer looks volume overloaded. SBP still on the lower side 90s-100s with creatinine lower at 1.31 this morning.  - No torsemide today, reassess volume status tomorrow.   Delene Loll stopped with low BP, will start losartan 12.5 daily today.  - Can start Farxiga 10 mg daily.  - Spironolactone 12.5 daily.   -  Ongoing workup of mediastinal LAN with Dr. Valeta Harms, needs EBUS (will be done as outpatient), ?sarcoidosis.  Will send ACE level.  2. Valvular heart disease: TEE showed severe MR.  The valve is abnormal with thickening of the leaflet tips and poor coapatation, ?underlying rheumatic or inflammatory etiology though not classic appearance for rheumatic heart disease and no mitral stenosis. Difficult to quantify mitral regurgitation by MRI due to low stroke volume (do not think regurgitant fraction is accurate). The aortic valve is also abnormal with thickening and doming.  Aortic insufficiency was moderate by TEE though regurgitant volume by cMRI suggested severe AI (I still suspect truly in moderate range).  - Discussed with Dr. Burt Knack who is going to review TEE images, will need to decide Mitraclip versus surgery.  With LV and RV EF as low as they are by cMRI, surgical risk is prohibitive and mTEER our best option.  Will also need consideration of TAVR with abnormal aortic valve.  Followup with structural heart clinic as outpatient.  3.  Pulmonary hypertension: Group 2 pulmonary venous hypertension.  4. Dysphagia: EGD showed esophageal candidiasis, no definite stricture. Had esophageal dilatation.  - Fluconazole for candidiasis.  5. H/o TIA/CVA: She has been on Plavix long-term.  On hold pre-EGD.  - Can restart Plavix, EBUS has not been scheduled yet, will need to stop again prior.   6. Pulmonary: Prior smoker with COPD. PET-CT showed hypermetabolic mediastinal LAN, either lymphoproliferative disorder or primary bronchogenic CA considered.  I also wonder if this could represent sarcoidosis.  - Needs EBUS, has seen Dr. Valeta Harms.  He is not going to do this admission (discussed with him), he will seen in followup after discharge.   7. AKI: Creatinine down to 1.31 today.   - No diuretic today.  8. SVT: Episode of SVT 1/12, required adenosine to break. No prior history.   - Will start beta blocker eventually if BP remains stable.   Mobilize. Hopefully can go home tomorrow.  Will need followup in structural heart clinic/[place], with Dr. Valeta Harms to schedule EBUS, and in CHF clinic.   Loralie Champagne 03/03/2022 10:30 AM

## 2022-03-04 ENCOUNTER — Other Ambulatory Visit (HOSPITAL_COMMUNITY): Payer: Self-pay

## 2022-03-04 DIAGNOSIS — I5021 Acute systolic (congestive) heart failure: Secondary | ICD-10-CM

## 2022-03-04 DIAGNOSIS — I340 Nonrheumatic mitral (valve) insufficiency: Secondary | ICD-10-CM | POA: Diagnosis not present

## 2022-03-04 LAB — BASIC METABOLIC PANEL
Anion gap: 8 (ref 5–15)
BUN: 18 mg/dL (ref 8–23)
CO2: 24 mmol/L (ref 22–32)
Calcium: 8.6 mg/dL — ABNORMAL LOW (ref 8.9–10.3)
Chloride: 103 mmol/L (ref 98–111)
Creatinine, Ser: 1.05 mg/dL — ABNORMAL HIGH (ref 0.44–1.00)
GFR, Estimated: 56 mL/min — ABNORMAL LOW (ref >60)
Glucose, Bld: 131 mg/dL — ABNORMAL HIGH (ref 70–99)
Potassium: 3.7 mmol/L (ref 3.5–5.1)
Sodium: 135 mmol/L (ref 135–145)

## 2022-03-04 MED ORDER — SPIRONOLACTONE 25 MG PO TABS
12.5000 mg | ORAL_TABLET | Freq: Every day | ORAL | 3 refills | Status: DC
  Filled 2022-03-04: qty 30, 60d supply, fill #0

## 2022-03-04 MED ORDER — POTASSIUM CHLORIDE CRYS ER 20 MEQ PO TBCR
20.0000 meq | EXTENDED_RELEASE_TABLET | Freq: Once | ORAL | Status: AC
  Administered 2022-03-04: 20 meq via ORAL
  Filled 2022-03-04: qty 1

## 2022-03-04 MED ORDER — TORSEMIDE 20 MG PO TABS
20.0000 mg | ORAL_TABLET | ORAL | Status: DC
  Administered 2022-03-04: 20 mg via ORAL
  Filled 2022-03-04: qty 1

## 2022-03-04 MED ORDER — LOSARTAN POTASSIUM 25 MG PO TABS
12.5000 mg | ORAL_TABLET | Freq: Every day | ORAL | 3 refills | Status: DC
  Filled 2022-03-04: qty 30, 60d supply, fill #0

## 2022-03-04 MED ORDER — DAPAGLIFLOZIN PROPANEDIOL 10 MG PO TABS
10.0000 mg | ORAL_TABLET | Freq: Every day | ORAL | 5 refills | Status: DC
  Filled 2022-03-04: qty 30, 30d supply, fill #0

## 2022-03-04 MED ORDER — POTASSIUM CHLORIDE CRYS ER 20 MEQ PO TBCR
20.0000 meq | EXTENDED_RELEASE_TABLET | ORAL | 11 refills | Status: DC
  Filled 2022-03-04: qty 15, 30d supply, fill #0

## 2022-03-04 MED ORDER — FLUCONAZOLE 200 MG PO TABS
200.0000 mg | ORAL_TABLET | Freq: Every day | ORAL | 0 refills | Status: AC
  Filled 2022-03-04: qty 12, 12d supply, fill #0

## 2022-03-04 MED ORDER — TORSEMIDE 20 MG PO TABS
20.0000 mg | ORAL_TABLET | ORAL | 3 refills | Status: DC
  Filled 2022-03-04: qty 30, 60d supply, fill #0

## 2022-03-04 NOTE — Care Management Important Message (Signed)
Important Message  Patient Details  Name: Shannon [name] MRN: [id number] Date of Birth: [birth year]   Medicare Important Message Given:  Yes     Emiel Kielty Montine Circle 03/04/2022, 3:18 PM

## 2022-03-04 NOTE — Progress Notes (Signed)
Explained discharge instructions to patient. Reviewed follow up appointment and next medication administration times. Also reviewed education. Patient verbalized having an understanding for instructions given. All belongings are in the patient's possession to include TOC meds. IV and telemetry were removed. CCMD was notified. No other needs verbalized. Transported downstairs for discharge. 

## 2022-03-04 NOTE — Plan of Care (Signed)
  Problem: Education: Goal: Understanding of CV disease, CV risk reduction, and recovery process will improve Outcome: Adequate for Discharge Goal: Individualized Educational Video(s) Outcome: Adequate for Discharge   Problem: Activity: Goal: Ability to return to baseline activity level will improve Outcome: Adequate for Discharge   Problem: Cardiovascular: Goal: Ability to achieve and maintain adequate cardiovascular perfusion will improve Outcome: Adequate for Discharge Goal: Vascular access site(s) Level 0-1 will be maintained Outcome: Adequate for Discharge   Problem: Health Behavior/Discharge Planning: Goal: Ability to safely manage health-related needs after discharge will improve Outcome: Adequate for Discharge   Problem: Education: Goal: Knowledge of General Education information will improve Description: Including pain rating scale, medication(s)/side effects and non-pharmacologic comfort measures Outcome: Adequate for Discharge   Problem: Health Behavior/Discharge Planning: Goal: Ability to manage health-related needs will improve Outcome: Adequate for Discharge   Problem: Clinical Measurements: Goal: Ability to maintain clinical measurements within normal limits will improve Outcome: Adequate for Discharge Goal: Will remain free from infection Outcome: Adequate for Discharge Goal: Diagnostic test results will improve Outcome: Adequate for Discharge Goal: Respiratory complications will improve Outcome: Adequate for Discharge Goal: Cardiovascular complication will be avoided Outcome: Adequate for Discharge   Problem: Activity: Goal: Risk for activity intolerance will decrease Outcome: Adequate for Discharge   Problem: Nutrition: Goal: Adequate nutrition will be maintained Outcome: Adequate for Discharge   Problem: Coping: Goal: Level of anxiety will decrease Outcome: Adequate for Discharge   Problem: Elimination: Goal: Will not experience complications  related to bowel motility Outcome: Adequate for Discharge Goal: Will not experience complications related to urinary retention Outcome: Adequate for Discharge   Problem: Pain Managment: Goal: General experience of comfort will improve Outcome: Adequate for Discharge   Problem: Safety: Goal: Ability to remain free from injury will improve Outcome: Adequate for Discharge   Problem: Skin Integrity: Goal: Risk for impaired skin integrity will decrease Outcome: Adequate for Discharge   Problem: Acute Rehab PT Goals(only PT should resolve) Goal: Pt Will Ambulate Outcome: Adequate for Discharge Goal: Pt Will Go Up/Down Stairs Outcome: Adequate for Discharge Goal: PT Additional Goal #1 Outcome: Adequate for Discharge

## 2022-03-04 NOTE — Progress Notes (Addendum)
Patient ID: Shannon [name], female   DOB: [birth year], 72 y.o.   MRN: [id number]     Advanced Heart Failure Rounding Note  PCP-Cardiologist: Quay Burow, MD   Subjective:    No further SVT. Cr 1.31>1.05  Feels fine this morning. Denies CP/SOB. Able to ambulate in halls w/o difficulty  [facility] 1/11: no significant CAD, only 10% pRCA narrowing, elevated R+ L heart filling pressures and marginal output, (PA saturation 48%, mean RA pressure 18 mmHg, PA pressure 50/28, mean PA pressure 37 mmHg, pulmonary capillary wedge pressure 35/36, mean pulmonary capillary wedge pressure 30 mmHg, cardiac output 3.92 L/min, cardiac index 2.27).    TEE (1/13): EF 30-35%, severe MR, moderate TR, moderate AI.   EGD/colonoscopy 1/14.  Colonoscopy unremarkable.  EGD with no definite cause for dysphagia noted but esophagus dilated.  Esophageal candidiasis noted.   Cardiac MRI: 1.  Normal LV size with diffuse hypokinesis, EF 18%. 2.  Normal RV size with EF 25%. 3. Aortic insufficiency looks at least moderate, regurgitant fraction 49% suggests severe aortic insufficiency. Thickened leaflet tips. 4. Thickened mitral valve leaflet tips. At least moderate and probably severe mitral regurgitation. Low stroke volume introduces error to the regurgitant fraction calculation. 5. Mid wall LGE in the basal septal and possible subtle LGE in the basal lateral wall. This is not a CAD pattern, possible prior myocarditis. Cannot fully rule out cardiac sarcoidosis involvement given mediastinal lymphadenopathy. 6. Elevated extracellular volume percentage suggests increased fibrotic content in LV myocardium.  Objective:   Weight Range: 61.1 kg Body mass index is 21.74 kg/m.   Vital Signs:   Temp:  [97.7 F (36.5 C)-97.9 F (36.6 C)] 97.9 F (36.6 C) (01/15 0722) Pulse Rate:  [87-105] 87 (01/15 0722) Resp:  [17-22] 18 (01/15 0722) BP: (90-117)/(51-73) 109/73 (01/15 0722) SpO2:  [92 %-96 %] 95 % (01/15 0722) Weight:   [61.1 kg] 61.1 kg (01/15 0523) Last BM Date : 03/03/22  Weight change: Filed Weights   03/03/22 0035 03/03/22 0500 03/04/22 0523  Weight: 61.9 kg 61.9 kg 61.1 kg    Intake/Output:   Intake/Output Summary (Last 24 hours) at 03/04/2022 0852 Last data filed at 03/03/2022 2200 Gross per 24 hour  Intake 783 ml  Output 0 ml  Net 783 ml      Physical Exam    General:  elderly appearing.  No respiratory difficulty HEENT: normal Neck: supple. JVD ~8 cm. Carotids 2+ bilat; no bruits. No lymphadenopathy or thyromegaly appreciated. Cor: PMI nondisplaced. Regular rate & rhythm. No rubs, gallops or murmurs. Lungs: clear Abdomen: soft, nontender, nondistended. No hepatosplenomegaly. No bruits or masses. Good bowel sounds. Extremities: no cyanosis, clubbing, rash, edema  Neuro: alert & oriented x 3, cranial nerves grossly intact. moves all 4 extremities w/o difficulty. Affect pleasant.   Telemetry   NSR 90s, no further SVT (Personally reviewed)    EKG    No new EKG to review  Labs    CBC No results for input(s): "WBC", "NEUTROABS", "HGB", "HCT", "MCV", "PLT" in the last 72 hours.  Basic Metabolic Panel Recent Labs    03/01/22 2319 03/02/22 0031 03/03/22 0058 03/04/22 0030  NA 135   < > 137 135  K 3.3*   < > 3.5 3.7  CL 97*   < > 101 103  CO2 27   < > 23 24  GLUCOSE 138*   < > 124* 131*  BUN 19   < > 19 18  CREATININE 1.51*   < > 1.31*  1.05*  CALCIUM 9.0   < > 8.8* 8.6*  MG 1.4*  --   --   --    < > = values in this interval not displayed.   Liver Function Tests No results for input(s): "AST", "ALT", "ALKPHOS", "BILITOT", "PROT", "ALBUMIN" in the last 72 hours.  No results for input(s): "LIPASE", "AMYLASE" in the last 72 hours. Cardiac Enzymes No results for input(s): "CKTOTAL", "CKMB", "CKMBINDEX", "TROPONINI" in the last 72 hours.  BNP: BNP (last 3 results) No results for input(s): "BNP" in the last 8760 hours.  ProBNP (last 3 results) Recent Labs     02/26/22 1546  PROBNP 2,865*     D-Dimer No results for input(s): "DDIMER" in the last 72 hours. Hemoglobin A1C No results for input(s): "HGBA1C" in the last 72 hours. Fasting Lipid Panel No results for input(s): "CHOL", "HDL", "LDLCALC", "TRIG", "CHOLHDL", "LDLDIRECT" in the last 72 hours. Thyroid Function Tests No results for input(s): "TSH", "T4TOTAL", "T3FREE", "THYROIDAB" in the last 72 hours.  Invalid input(s): "FREET3"  Other results:   Imaging    No results found.   Medications:     Scheduled Medications:  atorvastatin  80 mg Oral q1800   busPIRone  10 mg Oral BID   clopidogrel  75 mg Oral Daily   dapagliflozin propanediol  10 mg Oral Daily   ezetimibe  10 mg Oral Daily   fluconazole  200 mg Oral Daily   FLUoxetine  20 mg Oral Daily   fluticasone  1 spray Each Nare Daily   heparin  5,000 Units Subcutaneous Q8H   loratadine  10 mg Oral Daily   losartan  12.5 mg Oral Daily   mometasone-formoterol  2 puff Inhalation BID   montelukast  10 mg Oral QHS   pantoprazole  40 mg Oral Daily   sodium chloride flush  3 mL Intravenous Q12H   sodium chloride flush  3 mL Intravenous Q12H   sodium chloride flush  3 mL Intravenous Q12H   spironolactone  12.5 mg Oral Daily    Infusions:  sodium chloride     sodium chloride      PRN Medications: sodium chloride, sodium chloride, acetaminophen, albuterol, ondansetron (ZOFRAN) IV, mouth rinse, sodium chloride flush, sodium chloride flush    Patient Profile   72 y/o female w/ h/o chronic diastolic heart failure, HTN, HLD, h/o TIA, GERD and asthma. Former smoker (quit 72 y/o) AHF asked to see for acute on chronic systolic heart failure/ pulmonary HTN in setting of mitral regurgitation.   Assessment/Plan   1. Acute on chronic systolic CHF: Echo in 3/20 showed low normal EF 50-55%, no significant MR.  Echo in 1/24 showed EF 40-45%, mild RV dilation, moderate pulmonary HTN, mod-severe MR and moderate-severe TR.  RHC/LHC  with elevated left and right heart filling pressures, pulmonary venous hypertension, preserved cardiac output.  Nonischemic cardiomyopathy.  Cause is uncertain, ?valvular disease. TEE done 1/12 showed EF 30-35%, mild RV dysfunction, severe MR, moderate TR, moderate AI.  Cardiac MRI 1/12 showed LV EF 18%, RV EF 25%, ?severe AI, moderate-severe MR (regurgitant fraction not likely accurate with low SV), non-coronary mid-wall LGE in the basal septal wall.  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  She has diuresed well and no longer looks volume overloaded. SBP still on the lower side 90s-100s with creatinine lower at 1.05 this morning.  - Will restart Torsemide 20mg  with QOD dosing - Entresto stopped with low BP, continue losartan 12.5 daily.  -  Continue Farxiga 10 mg daily.  - Continue spironolactone 12.5 daily.   - Ongoing workup of mediastinal LAN with Dr. Valeta Harms, needs EBUS (will be done as outpatient), ?sarcoidosis.  ACE level sent.  2. Valvular heart disease: TEE showed severe MR.  The valve is abnormal with thickening of the leaflet tips and poor coapatation, ?underlying rheumatic or inflammatory etiology though not classic appearance for rheumatic heart disease and no mitral stenosis. Difficult to quantify mitral regurgitation by MRI due to low stroke volume (do not think regurgitant fraction is accurate). The aortic valve is also abnormal with thickening and doming.  Aortic insufficiency was moderate by TEE though regurgitant volume by cMRI suggested severe AI (I still suspect truly in moderate range).  - Discussed with Dr. Burt Knack who is going to review TEE images, will need to decide Mitraclip versus surgery.  With LV and RV EF as low as they are by cMRI, surgical risk is prohibitive and mTEER our best option.  Will also need consideration of TAVR with abnormal aortic valve.  Followup with structural heart clinic as outpatient.  3. Pulmonary  hypertension: Group 2 pulmonary venous hypertension.  4. Dysphagia: EGD showed esophageal candidiasis, no definite stricture. Had esophageal dilatation.  - Fluconazole for candidiasis.  5. H/o TIA/CVA: She has been on Plavix long-term.  On hold pre-EGD.  - Can restart Plavix, EBUS has not been scheduled yet, will need to stop again prior.   6. Pulmonary: Prior smoker with COPD. PET-CT showed hypermetabolic mediastinal LAN, either lymphoproliferative disorder or primary bronchogenic CA considered.  I also wonder if this could represent sarcoidosis.  - Needs EBUS, has seen Dr. Valeta Harms.  He is not going to do this admission (discussed with him), he will see in followup after discharge.   7. AKI: Creatinine down to 1.05 today.   - No diuretic today.  8. SVT: Episode of SVT 1/12, required adenosine to break. No prior history.   - Start carvedilol 3.125 mg BID  Needs followup in structural heart clinic/[place], has f/u with Dr. Erin Fulling to schedule EBUS, and has f/u in CHF clinic.   Stable to discharge on above medication regimen.    Earnie Larsson AGACNP-BC  03/04/2022 8:52 AM  Patient seen and examined with the above-signed Advanced Practice Provider and/or Housestaff. I personally reviewed laboratory data, imaging studies and relevant notes. I independently examined the patient and formulated the important aspects of the plan. I have edited the note to reflect any of my changes or salient points. I have personally discussed the plan with the patient and/or family.  Results of EGD and colonoscopy reviewed. She feels good. Denies CP or SOB. BP soft but stable.   General:  Sitting in chair  No resp difficulty HEENT: normal Neck: supple. no JVD. Carotids 2+ bilat; no bruits. No lymphadenopathy or thryomegaly appreciated. Cor: PMI nondisplaced. Regular rate & rhythm. 2/6 MR/AS Lungs: clear Abdomen: soft, nontender, nondistended. No hepatosplenomegaly. No bruits or masses. Good bowel  sounds. Extremities: no cyanosis, clubbing, rash, edema Neuro: alert & orientedx3, cranial nerves grossly intact. moves all 4 extremities w/o difficulty. Affect pleasant  Stable for d/c today on above meds. Will need outpatient f/u with Pulmonary, Structural Heart team and HF.   Glori Bickers, MD  12:35 PM

## 2022-03-04 NOTE — Progress Notes (Signed)
Pt sts she just returned from walking hall with staff, no c/o, feels well. Discussed with pt HF book and management. Gave her low sodium diet sheets. Good reception.  Yves Dill BS, ACSM-CEP 03/04/2022 12:13 PM [phone]

## 2022-03-04 NOTE — Discharge Summary (Signed)
Advanced Heart Failure Team  Discharge Summary   Patient ID: Shannon [name] MRN: [id number], DOB/AGE: 04-17-1950 72 y.o. Admit date: 02/28/2022 D/C date:     03/04/2022   Primary Discharge Diagnoses:  Acute on chronic systolic CHF Valvular heart disease  Secondary Discharge Diagnoses:  Pulmonary hypertension Dysphagia H/o TIA/CVA COPD AI SVT  Hospital Course:  Ms. Buxbaum is a 72 y/o female w/ h/o chronic diastolic heart failure, HTN, HLD, h/o TIA, GERD and asthma. Former smoker (quit 72 y/o) AHF asked to see for acute on chronic systolic heart failure/ pulmonary HTN in setting of severe mitral regurgitation.    Recently referred back to cardiology for surgical clearance prior to undergoing planned EGD and EBUS (ordered given progressive dysphagia, wt loss and abnormal chest CT). She endorsed exertional dyspnea w/ inability to complete of activity w/o symptoms. Echo done showed drop in EF to 40-45%. Referred for [facility]. Underwent cath which showed no significant CAD, only 10% pRCA narrowing, elevated R+ L heart filling pressures and marginal output, (PA saturation 48%, mean RA pressure 18 mmHg, PA pressure 50/28, mean PA pressure 37 mmHg, pulmonary capillary wedge pressure 35/36, mean pulmonary capillary wedge pressure 30 mmHg, cardiac output 3.92 L/min, cardiac index 2.27).    She was directly admitted from the cath lab for diuresis and further workup or severe MR. Diuresed well with IV lasix. Case dicussed with Dr. Excell Seltzer who is going to review TEE images, will need to decide Mitraclip versus surgery. With LV and RV EF as low as they are by cMRI, surgical risk is prohibitive and mTEER our best option. Will also need consideration of TAVR with abnormal aortic valve. Plan for outpatient f/u with structural team.   Pt will continue to be followed closely in the HF clinic. Dr Gala Romney evaluated and deemed appropriate for discharge.   Please see below for detailed problem list: 1.  Acute on chronic systolic CHF: Echo in 3/20 showed low normal EF 50-55%, no significant MR.  Echo in 1/24 showed EF 40-45%, mild RV dilation, moderate pulmonary HTN, mod-severe MR and moderate-severe TR.  RHC/LHC with elevated left and right heart filling pressures, pulmonary venous hypertension, preserved cardiac output.  Nonischemic cardiomyopathy.  Cause is uncertain, ?valvular disease. TEE done 1/12 showed EF 30-35%, mild RV dysfunction, severe MR, moderate TR, moderate AI.  Cardiac MRI 1/12 showed LV EF 18%, RV EF 25%, ?severe AI, moderate-severe MR (regurgitant fraction not likely accurate with low SV), non-coronary mid-wall LGE in the basal septal wall.  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  She has diuresed well and no longer looks volume overloaded. SBP still on the lower side 90s-100s with creatinine lower at 1.05 this morning.  - Continue Torsemide 20mg  with QOD dosing along with KDUR QOD with torsemide  - Entresto stopped with low BP, continue losartan 12.5 daily.  - Continue Farxiga 10 mg daily.  - Continue spironolactone 12.5 daily.   - Ongoing workup of mediastinal LAN with Dr. , needs EBUS (will be done as outpatient), ?sarcoidosis.  ACE level sent.  2. Valvular heart disease: TEE showed severe MR.  The valve is abnormal with thickening of the leaflet tips and poor coapatation, ?underlying rheumatic or inflammatory etiology though not classic appearance for rheumatic heart disease and no mitral stenosis. Difficult to quantify mitral regurgitation by MRI due to low stroke volume (do not think regurgitant fraction is accurate). The aortic valve is also abnormal with thickening and doming.  Aortic insufficiency  was moderate by TEE though regurgitant volume by cMRI suggested severe AI (I still suspect truly in moderate range).  - Discussed with Dr. Burt Knack who is going to review TEE images, will need to decide Mitraclip versus surgery.   With LV and RV EF as low as they are by cMRI, surgical risk is prohibitive and mTEER our best option.  Will also need consideration of TAVR with abnormal aortic valve.  Followup with structural heart clinic as outpatient.  3. Pulmonary hypertension: Group 2 pulmonary venous hypertension.  4. Dysphagia: EGD showed esophageal candidiasis, no definite stricture. Had esophageal dilatation.  - Fluconazole for candidiasis.  5. H/o TIA/CVA: She has been on Plavix long-term.  - Can restart Plavix, EBUS has not been scheduled yet, will need to stop again prior.   6. Pulmonary: Prior smoker with COPD. PET-CT showed hypermetabolic mediastinal LAN, either lymphoproliferative disorder or primary bronchogenic CA considered.  I also wonder if this could represent sarcoidosis.  - Needs EBUS, has seen Dr. Valeta Harms.  He is not going to do this admission (discussed with him), he will see in followup after discharge.   7. AKI: Creatinine down to 1.05 today.   8. SVT: Episode of SVT 1/12, required adenosine to break. No prior history.   - may need BB outpatient  Discharge Weight Range: 61.1kg  Discharge Vitals: Blood pressure 97/83, pulse 100, temperature 97.9 F (36.6 C), temperature source Oral, resp. rate 18, height 5\' 6"  (1.676 m), weight 61.1 kg, SpO2 97 %.  Labs: Lab Results  Component Value Date   WBC 4.0 02/28/2022   HGB 12.2 02/28/2022   HCT 37.0 02/28/2022   MCV 91.4 02/28/2022   PLT 305 02/28/2022    Recent Labs  Lab 02/26/22 1546 02/28/22 0907 03/04/22 0030  NA 143   < > 135  K 4.2   < > 3.7  CL 108*   < > 103  CO2 19*   < > 24  BUN 12   < > 18  CREATININE 1.14*   < > 1.05*  CALCIUM 9.4   < > 8.6*  PROT 6.8  --   --   BILITOT 1.6*  --   --   ALKPHOS 123*  --   --   ALT 25  --   --   AST 31  --   --   GLUCOSE 118*   < > 131*   < > = values in this interval not displayed.   Lab Results  Component Value Date   CHOL 193 06/17/2019   HDL 71 06/17/2019   LDLCALC 109 (H) 06/17/2019    TRIG 71 06/17/2019   BNP (last 3 results) No results for input(s): "BNP" in the last 8760 hours.  ProBNP (last 3 results) Recent Labs    02/26/22 1546  PROBNP 2,865*     Diagnostic Studies/Procedures   R/LHC 1/11: no significant CAD, only 10% pRCA narrowing, elevated R+ L heart filling pressures and marginal output, (PA saturation 48%, mean RA pressure 18 mmHg, PA pressure 50/28, mean PA pressure 37 mmHg, pulmonary capillary wedge pressure 35/36, mean pulmonary capillary wedge pressure 30 mmHg, cardiac output 3.92 L/min, cardiac index 2.27).     TEE done 1/12 showed EF 30-35%, mild RV dysfunction, severe MR, moderate TR, moderate AI   Cardiac MRI 1/12 showed LV EF 18%, RV EF 25%, ?severe AI, moderate-severe MR (regurgitant fraction not likely accurate with low SV), non-coronary mid-wall LGE in the basal septal wall   EGD/colonoscopy  1/14.  Colonoscopy unremarkable.  EGD with no definite cause for dysphagia noted but esophagus dilated.  Esophageal candidiasis noted.   Discharge Medications   Allergies as of 03/04/2022       Reactions   Amoxicillin-pot Clavulanate Itching   Other Other (See Comments)   08/06/2018 -    06/05/2016 -    09/15/2014 -    06/16/2014 -        Medication List     STOP taking these medications    furosemide 20 MG tablet Commonly known as: LASIX   GOODY HEADACHE PO   meloxicam 7.5 MG tablet Commonly known as: MOBIC   valsartan 80 MG tablet Commonly known as: DIOVAN       TAKE these medications    albuterol 108 (90 Base) MCG/ACT inhaler Commonly known as: VENTOLIN HFA Inhale 2 puffs into the lungs every 2 (two) hours as needed for wheezing or shortness of breath (cough).   albuterol (2.5 MG/3ML) 0.083% nebulizer solution Commonly known as: PROVENTIL Take 3 mLs (2.5 mg total) by nebulization every 6 (six) hours as needed for wheezing or shortness of breath.   atorvastatin 80 MG tablet Commonly known as: LIPITOR Take 1 tablet (80 mg  total) by mouth daily at 6 PM.   busPIRone 10 MG tablet Commonly known as: BUSPAR Take 10 mg by mouth 2 (two) times daily.   clopidogrel 75 MG tablet Commonly known as: PLAVIX Take 1 tablet (75 mg total) by mouth daily.   dapagliflozin propanediol 10 MG Tabs tablet Commonly known as: Farxiga Take 1 tablet (10 mg total) by mouth daily. Start taking on: March 05, 2022   desloratadine 5 MG tablet Commonly known as: CLARINEX Take 5 mg by mouth daily.   ezetimibe 10 MG tablet Commonly known as: ZETIA Take 10 mg by mouth daily.   fluconazole 200 MG tablet Commonly known as: DIFLUCAN Take 1 tablet (200 mg total) by mouth daily for 12 days. Start taking on: March 05, 2022   FLUoxetine 20 MG capsule Commonly known as: PROZAC Take 20 mg by mouth daily.   fluticasone 50 MCG/ACT nasal spray Commonly known as: FLONASE Place 1 spray into both nostrils daily.   fluticasone-salmeterol 500-50 MCG/ACT Aepb Commonly known as: Wixela Inhub Inhale 1 puff into the lungs in the morning and at bedtime.   losartan 25 MG tablet Commonly known as: COZAAR Take 1/2 tablet (12.5 mg total) by mouth daily. Start taking on: March 05, 2022   montelukast 10 MG tablet Commonly known as: SINGULAIR Take 1 tablet (10 mg total) by mouth at bedtime.   pantoprazole 40 MG tablet Commonly known as: PROTONIX TAKE 1 TABLET BY MOUTH EVERY DAY   potassium chloride SA 20 MEQ tablet Commonly known as: KLOR-CON M Take 1 tablet (20 mEq total) by mouth every other day on the same days that you take torsemide.   spironolactone 25 MG tablet Commonly known as: ALDACTONE Take 1/2 tablet (12.5 mg total) by mouth daily. Start taking on: March 05, 2022   torsemide 20 MG tablet Commonly known as: DEMADEX Take 1 tablet (20 mg total) by mouth every other day. Start taking on: March 06, 2022   Vitamin D 50 MCG (2000 UT) tablet Take 2,000 Units by mouth daily.               Durable Medical  Equipment  (From admission, onward)           Start     Ordered  03/01/22 1304  Heart failure home health orders  (Heart failure home health orders / Face to face)  Once       Comments: Heart Failure Follow-up Care:  Verify follow-up appointments per Patient Discharge Instructions. Confirm transportation arranged. Reconcile home medications with discharge medication list. Remove discontinued medications from use. Assist patient/caregiver to manage medications using pill box. Reinforce low sodium food selection Assessments: Vital signs and oxygen saturation at each visit. Assess home environment for safety concerns, caregiver support and availability of low-sodium foods. Consult Child psychotherapist, PT/OT, Dietitian, and CNA based on assessments. Perform comprehensive cardiopulmonary assessment. Notify MD for any change in condition or weight gain of 3 pounds in one day or 5 pounds in one week with symptoms. Daily Weights and Symptom Monitoring: Ensure patient has access to scales. Teach patient/caregiver to weigh daily before breakfast and after voiding using same scale and record.    Teach patient/caregiver to track weight and symptoms and when to notify Provider. Activity: Develop individualized activity plan with patient/caregiver.   Question Answer Comment  Heart Failure Follow-up Care Advanced Heart Failure (AHF) Clinic at [phone]   Lab frequency Other see comments   Fax lab results to AHF Clinic at [phone]   Diet Low Sodium Heart Healthy   Fluid restrictions: 1800 mL Fluid      03/01/22 1305            Disposition   The patient will be discharged in stable condition to home. Discharge Instructions     (HEART FAILURE PATIENTS) Call MD:  Anytime you have any of the following symptoms: 1) 3 pound weight gain in 24 hours or 5 pounds in 1 week 2) shortness of breath, with or without a dry hacking cough 3) swelling in the hands, feet or stomach 4) if you have to  sleep on extra pillows at night in order to breathe.   Complete by: As directed    Diet - low sodium heart healthy   Complete by: As directed    Increase activity slowly   Complete by: As directed        Follow-up Information     [facility] Follow up on 03/18/2022.   Specialty: Cardiology Why: Follow up in the Advanced Heart Failure Clinic 03/18/22 at 3pm  Entrance C, free valet Please bring all medications with you to the appt Contact information: [address] [phone] mc [address] [place] [address] [phone]        Sherral Hammers, FNP Follow up.   Specialty: Family Medicine Why: Please follow up in a week. Contact information: [address] [place] [address] [phone]                   Duration of Discharge Encounter: Greater than 35 minutes   Signed, Alen Bleacher AGACNP-BC  03/04/2022, 2:00 PM

## 2022-03-04 NOTE — TOC Transition Note (Signed)
Transition of Care [facility]) - CM/SW Discharge Note   Patient Details  Name: AKIMA SLAUGH MRN: [id number] Date of Birth: 1950/07/19  Transition of Care [facility]) CM/SW Contact:  Zenon Mayo, RN Phone Number: 03/04/2022, 1:57 PM   Clinical Narrative:    Patient is for dc today, patient was set up by previous NCM,  NCM notified Kelly with [facility] regarding dc.      Barriers to Discharge: Continued Medical Work up   Patient Goals and CMS Choice CMS Medicare.gov Compare Post Acute Care list provided to:: Patient    Discharge Placement                         Discharge Plan and Services Additional resources added to the After Visit Summary for     Discharge Planning Services: CM Consult                                 Social Determinants of Health (SDOH) Interventions SDOH Screenings   Food Insecurity: No Food Insecurity (02/28/2022)  Housing: Low Risk  (02/28/2022)  Transportation Needs: No Transportation Needs (02/28/2022)  Utilities: Not At Risk (02/28/2022)  Depression (PHQ2-9): Low Risk  (06/15/2018)  Tobacco Use: Medium Risk (03/03/2022)     Readmission Risk Interventions     No data to display

## 2022-03-04 NOTE — Progress Notes (Signed)
   03/04/22 1200  Mobility  Activity Ambulated independently in hallway  Level of Assistance Independent  Assistive Device None  Distance Ambulated (ft) 240 ft  Activity Response Tolerated well  Mobility Referral Yes  $Mobility charge 1 Mobility   Mobility Specialist Progress Note  Received pt in chair having no complaints and agreeable to mobility. Pt was asymptomatic throughout ambulation and returned to room w/o fault. Left in chair w/ call bell in reach and all needs met.   Lucious Groves Mobility Specialist  Please contact via SecureChat or Rehab office at [phone]

## 2022-03-05 ENCOUNTER — Telehealth (HOSPITAL_COMMUNITY): Payer: Self-pay | Admitting: Cardiology

## 2022-03-05 DIAGNOSIS — I340 Nonrheumatic mitral (valve) insufficiency: Secondary | ICD-10-CM

## 2022-03-05 NOTE — Telephone Encounter (Signed)
-----  Message from Larey Dresser, MD sent at 03/05/2022  1:22 PM EST ----- Please make sure this patient has hospital followup with me soon and also that she gets a referral to cardiac surgery => Dr. Lavonna Monarch.  Not candidate for Mitraclip, needs evaluation for surgical mitral valve replacement.  Thanks.   ----- Message ----- From: Sherren Mocha, MD Sent: 03/05/2022  11:01 AM EST To: Larey Dresser, MD; Theodoro Parma, RN  [place] - she's a no go for Clip. Just too much of an inflammatory valve and sub-valve apparatus. Everyone thought she would have a bad outcome. ----- Message ----- From: Theodoro Parma, RN Sent: 03/04/2022   2:14 PM EST To: Sherren Mocha, MD  Added! Thank you  ----- Message ----- From: Sherren Mocha, MD Sent: 03/04/2022   1:30 PM EST To: Larey Dresser, MD; Theodoro Parma, RN  Alycia Rossetti - can you put this patient on our list do review TEE for Clip. Kirk Ruths I looked over her study today. Not sure she will do well with Clip. Worry about how retracted the leaflet tips appear and the likely post-inflammatory mechanism of MR. Will let you know what the group thinks.  thx

## 2022-03-05 NOTE — Telephone Encounter (Signed)
Appt notes updated Referral placed   [facility]  TO PT FOR REFERRAL

## 2022-03-06 ENCOUNTER — Encounter (HOSPITAL_COMMUNITY): Payer: Self-pay | Admitting: Gastroenterology

## 2022-03-12 ENCOUNTER — Encounter (HOSPITAL_COMMUNITY): Payer: Self-pay | Admitting: Anesthesiology

## 2022-03-12 NOTE — Anesthesia Preprocedure Evaluation (Signed)
Anesthesia Evaluation    Reviewed: Allergy & Precautions, Patient's Chart, lab work & pertinent test results  Airway        Dental   Pulmonary asthma , sleep apnea , COPD, former smoker          Cardiovascular hypertension, +CHF  + dysrhythmias + Valvular Problems/Murmurs (severe MR) MR   TTE 2024 1. Left ventricular ejection fraction, by estimation, is 30 to 35%. The  left ventricle has moderately decreased function. The left ventricle  demonstrates global hypokinesis. The left ventricular internal cavity size  was mildly dilated. There is mild  concentric left ventricular hypertrophy.   2. Right ventricular systolic function is mildly reduced. The right  ventricular size is mildly enlarged. There is moderately elevated  pulmonary artery systolic pressure. The estimated right ventricular  systolic pressure is 00.1 mmHg.   3. Left atrial size was severely dilated. No left atrial/left atrial  appendage thrombus was detected.   4. Right atrial size was mildly dilated.   5. No ASD or PFO by color doppler.   6. The mitral valve is abnormal. Severe mitral valve regurgitation. The  mitral valve is thickened at the leaflet tips with poor coaptation. There  is restriction of the posterior leaflet. 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 There are 2 main jets of mitral regurgitation. PISA  measured for one jet, ERO 0.32 cm^2. The total vena contracta area was  0.58 cm^2. There was systolic flow reversal in the pulmonary vein doppler  pattern. No evidence of mitral  stenosis.   7. The tricuspid valve is abnormal. Tricuspid valve regurgitation is  moderate.   8. The aortic valve tips appeared thickened and domed. The aortic valve  is tricuspid. Aortic valve regurgitation is moderate. No aortic stenosis  is present.   9. The inferior vena cava is normal in size with greater than 50%  respiratory  variability, suggesting right atrial pressure of 3 mmHg.      Neuro/Psych  PSYCHIATRIC DISORDERS Anxiety     TIA   GI/Hepatic Neg liver ROS,GERD  ,,  Endo/Other  negative endocrine ROS    Renal/GU Renal InsufficiencyRenal disease  negative genitourinary   Musculoskeletal negative musculoskeletal ROS (+)    Abdominal   Peds  Hematology  (+) Blood dyscrasia (plavix)   Anesthesia Other Findings   Reproductive/Obstetrics                             Anesthesia Physical Anesthesia Plan  ASA: 3  Anesthesia Plan: MAC   Post-op Pain Management:    Induction: Intravenous  PONV Risk Score and Plan: Propofol infusion and Treatment may vary due to age or medical condition  Airway Management Planned: Natural Airway  Additional Equipment:   Intra-op Plan:   Post-operative Plan:   Informed Consent: I have reviewed the patients History and Physical, chart, labs and discussed the procedure including the risks, benefits and alternatives for the proposed anesthesia with the patient or authorized representative who has indicated his/her understanding and acceptance.     Dental advisory given  Plan Discussed with: CRNA  Anesthesia Plan Comments: (Procedure cancelled by cardiologist.)       Anesthesia Quick Evaluation

## 2022-03-13 ENCOUNTER — Encounter (HOSPITAL_COMMUNITY): Payer: Self-pay | Admitting: Cardiology

## 2022-03-13 ENCOUNTER — Encounter (HOSPITAL_COMMUNITY)
Admission: RE | Disposition: A | Discharge status: Home / Self Care | Payer: Self-pay | Source: Home / Self Care | Attending: Cardiology

## 2022-03-13 ENCOUNTER — Other Ambulatory Visit (HOSPITAL_COMMUNITY): Payer: Medicare HMO

## 2022-03-13 ENCOUNTER — Other Ambulatory Visit: Payer: Self-pay

## 2022-03-13 ENCOUNTER — Ambulatory Visit (HOSPITAL_COMMUNITY)
Admission: RE | Admit: 2022-03-13 | Discharge: 2022-03-13 | Disposition: A | Discharge status: Home / Self Care | Payer: Medicare HMO | Attending: Cardiology | Admitting: Cardiology

## 2022-03-13 DIAGNOSIS — Z538 Procedure and treatment not carried out for other reasons: Secondary | ICD-10-CM | POA: Insufficient documentation

## 2022-03-13 DIAGNOSIS — I340 Nonrheumatic mitral (valve) insufficiency: Secondary | ICD-10-CM | POA: Insufficient documentation

## 2022-03-13 SURGERY — CANCELLED PROCEDURE
Anesthesia: Monitor Anesthesia Care

## 2022-03-13 MED ORDER — SODIUM CHLORIDE 0.9 % IV SOLN: INTRAVENOUS | Status: DC

## 2022-03-13 NOTE — Progress Notes (Signed)
Physician spoke to patient at [phone] and cancelled procedure.  Pt had the test performed during her previous admission, so the test was no longer necessary.

## 2022-03-15 NOTE — Progress Notes (Signed)
ADVANCED HF CLINIC NOTE   Primary Care: Delford Field, FNP HF Cardiologist: Dr. Aundra Dubin  HPI: 72 y.o. female w/ h/o chronic diastolic heart failure, HTN, HLD, h/o TIA, GERD and asthma. Former smoker (quit 72 y/o). Noted to have high coronary calcium score of 507 in March 2022. Referred to lipid clinic for aggressive tx of HLD but did not undergo cath in absence of symptoms. Echo 04/2018 showed normal LVEF and RV. No significant valvular dysfunction.    Recently referred back to cardiology for surgical clearance prior to undergoing planned EGD and EBUS (ordered given progressive dysphagia, wt loss and abnormal chest CT). She endorsed exertional dyspnea w/ inability to complete 4METs of activity w/o symptoms. Echo was done and showed drop in EF down to 40-45% w/ diffuse HK, severe LAE mod-severe MR, severely elevated RVSP, RV normal, mod-severe RAE and mod-severe TR. Subsequently referred for [facility].    [facility] 1/24 showed no significant CAD, only 10% pRCA narrowing, elevated R+ L heart filling pressures and marginal output, (PA saturation 48%, mean RA pressure 18 mmHg, PA pressure 50/28, mean PA pressure 37 mmHg, pulmonary capillary wedge pressure 35/36, mean pulmonary capillary wedge pressure 30 mmHg, cardiac output 3.92 L/min, cardiac index 2.27).    She was direct admitted from the cath lab for IV diuresis. TEE showed EF 30-35%, mild RV dysfunction, severe MR, moderate TR, moderate AI.  Cardiac MRI showed LV EF 18%, RV EF 25%, ?severe AI, moderate-severe MR (regurgitant fraction not likely accurate with low SV), non-coronary mid-wall LGE in the basal septal wall.  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  She had dysphagia and underwent EGD/colonoscopy showing esophageal candidiasis, unremarkable colonoscopy. Hospitalization also complicated by episode of SVT requiring adenosine. Her GDMT was titrated, but limited by soft BP. Structural Heart  team to review TEE to decide on mTEER candidacy. She was discharged home, weight 134 lbs.  Today she returns for post hospital HF follow up with her sister. Overall feeling fine. She does not have dyspnea walking on flat ground or with grocery shopping. Has a productive cough x 1 day. Denies palpitations, CP, dizziness, edema, or PND/Orthopnea. Appetite ok. No fever or chills. Weight at home 126 pounds. Taking all medications. Retired from [employer]. Lives at home with her son. No tobacco use, 2-3 ETOH drinks on the weekends, no drugs. She snores occasionally, had a sleep study a couple years ago.  ECG (personally reviewed): ST 104 bpm  Labs (1/24): K 4.4, creatinine 1.42, AST 85, ALT 73  PMH: 1. 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  2. COPD: Prior smoker.  3. Mediastinal lymphadenopathy: PET-CT with hypermetabolic lymphadenopathy, ?lymphoproliferative disorder vs primary bronchogenic carcinoma vs sarcoidosis.  4. CKD stage 3 5. SVT: Occurred in 1/24, required adenosine.  6. Chronic systolic CHF:  Nonischemic cardiomyopathy.  - Echo (3/20): EF 50-55%, RV normal, mild MR - Echo (1/24): EF 40-45%, Severe LAE, mod-severe MR, severely elevated RVSP, RV normal, mod-severe RAE, mod-severe TR  - TEE (1/24): EF 30-35%, severe MR, moderate TR, moderate AI - Cardiac MRI (1/24): LVEF 18%, RVEF 25%, moderate AI visually but regurgitant fraction 49% suggests severe AI, moderate-severe MR,  non-coronary mid-wall LGE in the basal septal wall, cannot rule out cardiac sarcoidosis (unexplained mediastinal LAN) - R/LHC (1/24): Mild nonobstructive CAD; RA mean 18, PA 50/28 (mean 37), PCWP 30, CO/CI: 3.92/ 2.27 7. Valvular heart disease:  TEE in 1/24 showed severe MR.  The valve is abnormal with thickening  of the leaflet tips and poor coapatation, ?underlying rheumatic or inflammatory etiology though not classic appearance for rheumatic heart disease and no mitral stenosis. Difficult to quantify mitral  regurgitation by MRI due to low stroke volume (do not think regurgitant fraction is accurate). The aortic valve is also abnormal with thickening and doming.  Aortic insufficiency was moderate by TEE though regurgitant volume by cMRI suggested severe AI (I still suspect truly in moderate range).  8. GERD  Review of Systems: All systems reviewed and negative except as per HPI.    Current Outpatient Medications  Medication Sig Dispense Refill   albuterol (PROVENTIL HFA;VENTOLIN HFA) 108 (90 BASE) MCG/ACT inhaler Inhale 2 puffs into the lungs every 2 (two) hours as needed for wheezing or shortness of breath (cough). 1 Inhaler 0   albuterol (PROVENTIL) (2.5 MG/3ML) 0.083% nebulizer solution Take 3 mLs (2.5 mg total) by nebulization every 6 (six) hours as needed for wheezing or shortness of breath. 75 mL 12   atorvastatin (LIPITOR) 80 MG tablet Take 1 tablet (80 mg total) by mouth daily at 6 PM. 30 tablet 2   busPIRone (BUSPAR) 10 MG tablet Take 10 mg by mouth 2 (two) times daily.     Cholecalciferol (VITAMIN D) 50 MCG (2000 UT) tablet Take 2,000 Units by mouth daily.     clopidogrel (PLAVIX) 75 MG tablet Take 1 tablet (75 mg total) by mouth daily. 30 tablet 0   dapagliflozin propanediol (FARXIGA) 10 MG TABS tablet Take 1 tablet (10 mg total) by mouth daily. 30 tablet 5   desloratadine (CLARINEX) 5 MG tablet Take 5 mg by mouth daily.     ezetimibe (ZETIA) 10 MG tablet Take 10 mg by mouth daily.     FLUoxetine (PROZAC) 20 MG capsule Take 20 mg by mouth daily.     fluticasone (FLONASE) 50 MCG/ACT nasal spray Place 1 spray into both nostrils daily.     fluticasone-salmeterol (WIXELA INHUB) 500-50 MCG/ACT AEPB Inhale 1 puff into the lungs in the morning and at bedtime. 60 each 6   losartan (COZAAR) 25 MG tablet Take 1/2 tablet (12.5 mg total) by mouth daily. 30 tablet 3   montelukast (SINGULAIR) 10 MG tablet Take 1 tablet (10 mg total) by mouth at bedtime. 30 tablet 11   pantoprazole (PROTONIX) 40 MG  tablet TAKE 1 TABLET BY MOUTH EVERY DAY 90 tablet 1   potassium chloride SA (KLOR-CON M) 20 MEQ tablet Take 1 tablet (20 mEq total) by mouth every other day on the same days that you take torsemide. 15 tablet 11   spironolactone (ALDACTONE) 25 MG tablet Take 1/2 tablet (12.5 mg total) by mouth daily. 30 tablet 3   torsemide (DEMADEX) 20 MG tablet Take 1 tablet (20 mg total) by mouth every other day. 30 tablet 3   No current facility-administered medications for this encounter.   Allergies  Allergen Reactions   Amoxicillin-Pot Clavulanate Itching   Other Other (See Comments)    08/06/2018 -    06/05/2016 -    09/15/2014 -    06/16/2014 -   Social History   Socioeconomic History   Marital status: Widowed    Spouse name: Not on file   Number of children: Not on file   Years of education: Not on file   Highest education level: Not on file  Occupational History   Not on file  Tobacco Use   Smoking status: Former    Packs/day: 0.50    Years: 30.00  Total pack years: 15.00    Types: Cigarettes    Quit date: 11/10/1998    Years since quitting: 23.3   Smokeless tobacco: Never  Vaping Use   Vaping Use: Never used  Substance and Sexual Activity   Alcohol use: Yes    Comment: occasional   Drug use: No   Sexual activity: Not Currently  Other Topics Concern   Not on file  Social History Narrative   Not on file   Social Determinants of Health   Financial Resource Strain: Not on file  Food Insecurity: No Food Insecurity (02/28/2022)   Hunger Vital Sign    Worried About Running Out of Food in the Last Year: Never true    Ran Out of Food in the Last Year: Never true  Transportation Needs: No Transportation Needs (02/28/2022)   PRAPARE - Hydrologist (Medical): No    Lack of Transportation (Non-Medical): No  Physical Activity: Not on file  Stress: Not on file  Social Connections: Not on file  Intimate Partner Violence: Not At Risk (02/28/2022)    Humiliation, Afraid, Rape, and Kick questionnaire    Fear of Current or Ex-Partner: No    Emotionally Abused: No    Physically Abused: No    Sexually Abused: No   Family History  Problem Relation Age of Onset   Hypertension Mother    Hypertension Father    Colon cancer Father    Hypertension Sister    BP 100/68   Pulse (!) 114   Wt 60.8 kg (134 lb)   SpO2 94%   BMI 21.63 kg/m   Wt Readings from Last 3 Encounters:  03/18/22 60.8 kg (134 lb)  03/13/22 61.1 kg (134 lb 11.2 oz)  03/04/22 61.1 kg (134 lb 11.2 oz)   PHYSICAL EXAM: General:  NAD. No resp difficulty, walked into clinic HEENT: Normal Neck: Supple. No JVD. Carotids 2+ bilat; no bruits. No lymphadenopathy or thryomegaly appreciated. Cor: PMI nondisplaced. Tachy rate & rhythm. No rubs, gallops, 2/6 MR and AS Lungs: Clear Abdomen: Soft, nontender, nondistended. No hepatosplenomegaly. No bruits or masses. Good bowel sounds. Extremities: No cyanosis, clubbing, rash, edema Neuro: Alert & oriented x 3, cranial nerves grossly intact. Moves all 4 extremities w/o difficulty. Affect pleasant.  ASSESSMENT & PLAN: 1. Chronic systolic CHF: Echo in 2/13 showed low normal EF 50-55%, no significant MR.  Echo in 1/24 showed EF 40-45%, mild RV dilation, moderate pulmonary HTN, mod-severe MR and moderate-severe TR.  RHC/LHC (1/24) with elevated left and right heart filling pressures, pulmonary venous hypertension, preserved cardiac output.  Nonischemic cardiomyopathy.  Cause is uncertain, ?valvular disease. TEE done 03/01/22 showed EF 30-35%, mild RV dysfunction, severe MR, moderate TR, moderate AI.  Cardiac MRI 1/12 showed LV EF 18%, RV EF 25%, ? severe AI, moderate-severe MR (regurgitant fraction not likely accurate with low SV), non-coronary mid-wall LGE in the basal septal wall.  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  Today, she is NYHA I-II, she is not volume overloaded on exam.  -  Start Toprol XL 12.5 mg qhs.  - Continue torsemide 20 mg with every other day.  - Continue losartan 12.5 mg daily (Entresto stopped with low BP). - Continue Farxiga 10 mg daily. CMET and BNP today. - Continue spironolactone 12.5 daily.   - Ongoing workup of mediastinal LAN with Dr. Valeta Harms, needs EBUS, ? sarcoidosis.   2. Valvular heart disease: TEE showed severe MR.  The valve  is abnormal with thickening of the leaflet tips and poor coapatation, ?underlying rheumatic or inflammatory etiology though not classic appearance for rheumatic heart disease and no mitral stenosis. Difficult to quantify mitral regurgitation by MRI due to low stroke volume (do not think regurgitant fraction is accurate). The aortic valve is also abnormal with thickening and doming.  Aortic insufficiency was moderate by TEE though regurgitant volume by cMRI suggested severe AI (I still suspect truly in moderate range).  - The structural heart team reviewed TEE images and felt she is not suitable for mTEER. Will refer to Dr. Leafy Ro for evaluation for surgical mitral valve replacement, as well as AVR.  3. Pulmonary hypertension: Group 2 pulmonary venous hypertension.  4. Dysphagia: EGD showed esophageal candidiasis, no definite stricture. Had esophageal dilatation.  - Continue fluconazole for candidiasis.  5. H/o TIA/CVA: She has been on Plavix long-term.   - Continue Plavix, EBUS has not been scheduled yet, will need to stop again prior.   6. Pulmonary: Prior smoker with COPD. PET-CT showed hypermetabolic mediastinal LAN, either lymphoproliferative disorder or primary bronchogenic CA considered.  I also wonder if this could represent sarcoidosis.  - Needs EBUS, has seen Dr. Tonia Brooms and has follow up arranged with Dr. Francine Graven next month. 7. H/o AKI: Labs today. 8. SVT: Episode of SVT 1/12, required adenosine to break. No prior history.   - Start Toprol as above.   Follow up in 4 weeks.  Prince Rome,  FNP-BC 03/18/22  Patient seen with NP, agree with the above note.   Patient returns for post-hospital followup.  She is doing well symptomatically.  Mild sinus tachycardia.  She denies dyspnea walking on flat ground and walking around stores.  Doing fine with all ADLs.  No orthopnea.  No lightheadedness.   General: NAD Neck: No JVD, no thyromegaly or thyroid nodule.  Lungs: Clear to auscultation bilaterally with normal respiratory effort. CV: Nondisplaced PMI.  Heart regular S1/S2, no S3/S4, 2/6 HSM apex.  No peripheral edema.   Abdomen: Soft, nontender, no hepatosplenomegaly, no distention.  Skin: Intact without lesions or rashes.  Neurologic: Alert and oriented x 3.  Psych: Normal affect. Extremities: No clubbing or cyanosis.  HEENT: Normal.   Patient has severe MR with thickened valvular margins concerning for inflammatory etiology though not classically rheumatic and no mitral stenosis.  She also has at least moderate aortic insufficiency with an abnormal aortic valve.  She has severe LV and RV dysfunction by cardiac MRI though EF looked better by TEE (30-35% range with mild RV dysfunction).  - She was reviewed by the structural heart team and determined not to be mTEER candidate due to inflammatory valve and subvalvular apparatus.  - I will refer her to Dr. Leafy Ro to evaluate for high risk MVR and likely AVR, ?minimally invasive.  - She looks euvolemic, continue current torsemide as long as creatinine is stable.  - Add Toprol XL 12.5 mg daily.  - CMET/BNP today.   Patient still needs evaluation of mediastinal adenopathy, plan had been for EBUS with Dr. Tonia Brooms but not scheduled yet. She has an appointment in the pulmonary office with Dr. Francine Graven.   Marca Ancona 03/18/2022

## 2022-03-18 ENCOUNTER — Ambulatory Visit (HOSPITAL_COMMUNITY)
Admit: 2022-03-18 | Discharge: 2022-03-18 | Disposition: A | Discharge status: Home / Self Care | Payer: Medicare HMO | Attending: Family Medicine | Admitting: Family Medicine

## 2022-03-18 VITALS — BP 100/68 | HR 114 | Wt 134.0 lb

## 2022-03-18 DIAGNOSIS — I2722 Pulmonary hypertension due to left heart disease: Secondary | ICD-10-CM | POA: Insufficient documentation

## 2022-03-18 DIAGNOSIS — I428 Other cardiomyopathies: Secondary | ICD-10-CM | POA: Insufficient documentation

## 2022-03-18 DIAGNOSIS — Z8673 Personal history of transient ischemic attack (TIA), and cerebral infarction without residual deficits: Secondary | ICD-10-CM | POA: Diagnosis not present

## 2022-03-18 DIAGNOSIS — I5042 Chronic combined systolic (congestive) and diastolic (congestive) heart failure: Secondary | ICD-10-CM | POA: Insufficient documentation

## 2022-03-18 DIAGNOSIS — N183 Chronic kidney disease, stage 3 unspecified: Secondary | ICD-10-CM | POA: Diagnosis not present

## 2022-03-18 DIAGNOSIS — Z87891 Personal history of nicotine dependence: Secondary | ICD-10-CM | POA: Diagnosis not present

## 2022-03-18 DIAGNOSIS — I471 Supraventricular tachycardia, unspecified: Secondary | ICD-10-CM | POA: Insufficient documentation

## 2022-03-18 DIAGNOSIS — Z7951 Long term (current) use of inhaled steroids: Secondary | ICD-10-CM | POA: Diagnosis not present

## 2022-03-18 DIAGNOSIS — K219 Gastro-esophageal reflux disease without esophagitis: Secondary | ICD-10-CM | POA: Insufficient documentation

## 2022-03-18 DIAGNOSIS — I130 Hypertensive heart and chronic kidney disease with heart failure and stage 1 through stage 4 chronic kidney disease, or unspecified chronic kidney disease: Secondary | ICD-10-CM | POA: Diagnosis not present

## 2022-03-18 DIAGNOSIS — I5043 Acute on chronic combined systolic (congestive) and diastolic (congestive) heart failure: Secondary | ICD-10-CM | POA: Diagnosis not present

## 2022-03-18 DIAGNOSIS — R1310 Dysphagia, unspecified: Secondary | ICD-10-CM | POA: Diagnosis not present

## 2022-03-18 DIAGNOSIS — Z7902 Long term (current) use of antithrombotics/antiplatelets: Secondary | ICD-10-CM | POA: Insufficient documentation

## 2022-03-18 DIAGNOSIS — I083 Combined rheumatic disorders of mitral, aortic and tricuspid valves: Secondary | ICD-10-CM | POA: Diagnosis not present

## 2022-03-18 DIAGNOSIS — E785 Hyperlipidemia, unspecified: Secondary | ICD-10-CM | POA: Diagnosis not present

## 2022-03-18 DIAGNOSIS — I340 Nonrheumatic mitral (valve) insufficiency: Secondary | ICD-10-CM

## 2022-03-18 DIAGNOSIS — I5020 Unspecified systolic (congestive) heart failure: Secondary | ICD-10-CM

## 2022-03-18 DIAGNOSIS — J4489 Other specified chronic obstructive pulmonary disease: Secondary | ICD-10-CM | POA: Diagnosis not present

## 2022-03-18 DIAGNOSIS — Z79899 Other long term (current) drug therapy: Secondary | ICD-10-CM | POA: Insufficient documentation

## 2022-03-18 LAB — COMPREHENSIVE METABOLIC PANEL
ALT: 44 U/L (ref 0–44)
AST: 41 U/L (ref 15–41)
Albumin: 3.8 g/dL (ref 3.5–5.0)
Alkaline Phosphatase: 92 U/L (ref 38–126)
Anion gap: 11 (ref 5–15)
BUN: 35 mg/dL — ABNORMAL HIGH (ref 8–23)
CO2: 25 mmol/L (ref 22–32)
Calcium: 9.5 mg/dL (ref 8.9–10.3)
Chloride: 100 mmol/L (ref 98–111)
Creatinine, Ser: 2.03 mg/dL — ABNORMAL HIGH (ref 0.44–1.00)
GFR, Estimated: 26 mL/min — ABNORMAL LOW (ref >60)
Glucose, Bld: 98 mg/dL (ref 70–99)
Potassium: 3.6 mmol/L (ref 3.5–5.1)
Sodium: 136 mmol/L (ref 135–145)
Total Bilirubin: 0.6 mg/dL (ref 0.3–1.2)
Total Protein: 7.5 g/dL (ref 6.5–8.1)

## 2022-03-18 LAB — BRAIN NATRIURETIC PEPTIDE: B Natriuretic Peptide: 58.1 pg/mL (ref 0.0–100.0)

## 2022-03-18 MED ORDER — METOPROLOL SUCCINATE ER 25 MG PO TB24: 12.5000 mg | ORAL_TABLET | Freq: Every evening | ORAL | 8 refills | Status: DC

## 2022-03-18 NOTE — Patient Instructions (Addendum)
Thank you for coming in today  START Toprol 12.5 mg 1/2 tablet nightly  You have been referred to Cardiothoracic Surgery their office will contact you for appointment scheduling   Your physician recommends that you schedule a follow-up appointment in:  4 weeks     Do the following things EVERYDAY: Weigh yourself in the morning before breakfast. Write it down and keep it in a log. Take your medicines as prescribed Eat low salt foods--Limit salt (sodium) to 2000 mg per day.  Stay as active as you can everyday Limit all fluids for the day to less than 2 liters  At the [facility], you and your health needs are our priority. As part of our continuing mission to provide you with exceptional heart care, we have created designated Provider Care Teams. These Care Teams include your primary Cardiologist (physician) and Advanced Practice Providers (APPs- Physician Assistants and Nurse Practitioners) who all work together to provide you with the care you need, when you need it.   You may see any of the following providers on your designated Care Team at your next follow up: Dr Glori Bickers Dr Loralie Champagne Dr. Roxana Hires, NP Lyda Jester, [place] [facility] [place], [place] Forestine Na, NP Audry Riles, PharmD   Please be sure to bring in all your medications bottles to every appointment.    Thank you for choosing [facility]   If you have any questions or concerns before your next appointment please send [place] a message through [place] or call our office at [phone].    TO LEAVE A MESSAGE FOR THE NURSE SELECT OPTION 2, PLEASE LEAVE A MESSAGE INCLUDING: YOUR NAME DATE OF BIRTH CALL BACK NUMBER REASON FOR CALL**this is important as we prioritize the call backs  YOU WILL RECEIVE A CALL BACK THE SAME DAY AS LONG AS YOU CALL BEFORE 4:00 PM

## 2022-03-19 ENCOUNTER — Telehealth (HOSPITAL_COMMUNITY): Payer: Self-pay | Admitting: *Deleted

## 2022-03-19 MED ORDER — TORSEMIDE 20 MG PO TABS: 10.0000 mg | ORAL_TABLET | ORAL | 3 refills | Status: DC

## 2022-03-19 NOTE — Telephone Encounter (Signed)
Called patient with following lab results and instructions per Dr. Aundra Dubin:  "Significant rise in creatinine.  Hold torsemide for 4 days then decrease to 10 mg every other day."  Pt verbalized understanding of same. Asked her to call Heart Failure Clinic at [phone] if any questions or concerns.

## 2022-03-25 ENCOUNTER — Ambulatory Visit (INDEPENDENT_AMBULATORY_CARE_PROVIDER_SITE_OTHER): Payer: Medicare HMO | Admitting: Pulmonary Disease

## 2022-03-25 ENCOUNTER — Encounter: Payer: Self-pay | Admitting: Pulmonary Disease

## 2022-03-25 VITALS — BP 120/72 | HR 101 | Ht 66.0 in | Wt 132.0 lb

## 2022-03-25 DIAGNOSIS — I5020 Unspecified systolic (congestive) heart failure: Secondary | ICD-10-CM

## 2022-03-25 DIAGNOSIS — J4540 Moderate persistent asthma, uncomplicated: Secondary | ICD-10-CM

## 2022-03-25 DIAGNOSIS — I340 Nonrheumatic mitral (valve) insufficiency: Secondary | ICD-10-CM | POA: Diagnosis not present

## 2022-03-25 DIAGNOSIS — R590 Localized enlarged lymph nodes: Secondary | ICD-10-CM | POA: Diagnosis not present

## 2022-03-25 NOTE — H&P (View-Only) (Signed)
Synopsis: Referred in March 2023 for shortness of breath by Vanita Panda, MD  Subjective:   PATIENT ID: Shannon [name] GENDER: female DOB: 09/04/1950, MRN: [id number]  HPI  Chief Complaint  Patient presents with   Follow-up    F/U after [place]. States she has been doing well since last visit.    Lashawanda Norbeck is a 72 year old woman, former smoker with hypertension, GERD and asthma who returns to pulmonary clinic for emphysema and abnormal chest imaging.   She was recently admitted 1/11 to 1/15 for heart failure management and significant mitral valve regurgication. She had L/RHC which showed no significant CAD, only 10% pRCA narrowing, elevated R+ L heart filling pressures and marginal output, (PA saturation 48%, mean RA pressure 18 mmHg, PA pressure 50/28, mean PA pressure 37 mmHg, pulmonary capillary wedge pressure 35/36, mean pulmonary capillary wedge pressure 30 mmHg, cardiac output 3.92 L/min, cardiac index 2.27).   She had EGD done during admission which did not indicate any obvious reason for dysphagia except for possible thrush in a small areas, her esophagus was dilated though and treated with fluconazole.   She is overall feeling much better. She is eating well. Her shortness of breath is significantly  better. She denies any fevers or chills.   PET CT scan shows avid mediastinal adenopathy of the 4R region. EBUS was planned previously but post-poned due to acute cardiac issues.   OV 02/19/22 She was treated with extended steroid taper after last visit which seemed to help her feel better but after completion she continues to have significant fatigue. She was not able to have her EGD done due to being on clopidogrel with not knowing if she was able to stop it temporarily. She has lost 5lbs since last visit. She is still not eating or drinking well due to trouble swallowing.   She continues to have productive cough. No fevers, chills or sweats.  OV 01/23/22 Dizzy,  fatigue, decreased appetite. No fevers, chills. + Night sweats.  Cough and sputum production are better. Nausea and vomiting since last week.   Urine has turned dark brown, but is clearing up now.   She is getting her esophagus stretch on 12/19.   Joint aches in legs  OV 01/07/22 She reports about an 8lb weight loss since last visit with productive cough and some night sweats. Repeat CT Chest scan shows resolution of the LUL infiltrate that was biopsy positive for aspergillus but new right sided findings with segmental consolidation with air bronchograms in right lower lobe, new right paratracheal adenopathy and mild atelectasis and effusion in the posterior right upper lobe.   She does report dysphagia and has an appointment to see GI later this month regarding possible need for EGD and dilation which she has required in the past.   OV 08/08/21 She has done well since hospitaliztion in May. She had nav bronch performed on 5/23 for RUL nodule which was negative for malignancy. BAL grew aspergillus.   She continues on advair 500-57mg 1 puff twice daily.   PFTs today show moderate diffusion defect.   Initial OV 05/03/21 Patient reports using Advair 500-50 mcg 1 puff twice daily over the last 15 to 20 years without much issue until recently she has noticed increased wheezing in the evening at bedtime.  She uses albuterol inhaler with relief of her wheezing.  She denies nighttime awakenings from her sleep with cough wheezing or shortness of breath.  Cold air and hot humid days seem  to bother her asthma symptoms along with strong cleaning agents.  She denies any issues with strong perfumes or colognes.  She also reports increased reflux over recent months and has complaints of food getting stuck in her chest similar to before when she required esophageal dilation by gastroenterology.  She has upcoming appointment with her GI team for follow-up.  She is eating 1 hour prior to bedtime.  Seasonal  allergies can also affect her asthma symptoms especially spring season.  She is retired and previously worked at [employer] as a Training and development officer.  She is a former smoker and quit in 2000.  She smoked half a pack a day for 30 years.  She was exposed to secondhand smoke in childhood.  She currently lives with her son.  Past Medical History:  Diagnosis Date   Anxiety    Arthritis    "right knee" (05/26/2014)   Asthma    Chronic lower back pain    Dysrhythmia    irregular heart beat   GERD (gastroesophageal reflux disease)    HLD (hyperlipidemia)    Hypertension    Pneumonia    Stroke [facility])    TIA - ?2021   TIA (transient ischemic attack) 04/2018     Family History  Problem Relation Age of Onset   Hypertension Mother    Hypertension Father    Colon cancer Father    Hypertension Sister      Social History   Socioeconomic History   Marital status: Widowed    Spouse name: Not on file   Number of children: Not on file   Years of education: Not on file   Highest education level: Not on file  Occupational History   Not on file  Tobacco Use   Smoking status: Former    Packs/day: 0.50    Years: 30.00    Total pack years: 15.00    Types: Cigarettes    Quit date: 11/10/1998    Years since quitting: 23.3   Smokeless tobacco: Never  Vaping Use   Vaping Use: Never used  Substance and Sexual Activity   Alcohol use: Yes    Comment: occasional   Drug use: No   Sexual activity: Not Currently  Other Topics Concern   Not on file  Social History Narrative   Not on file   Social Determinants of Health   Financial Resource Strain: Not on file  Food Insecurity: No Food Insecurity (02/28/2022)   Hunger Vital Sign    Worried About Running Out of Food in the Last Year: Never true    Ran Out of Food in the Last Year: Never true  Transportation Needs: No Transportation Needs (02/28/2022)   PRAPARE - Hydrologist (Medical): No    Lack of Transportation  (Non-Medical): No  Physical Activity: Not on file  Stress: Not on file  Social Connections: Not on file  Intimate Partner Violence: Not At Risk (02/28/2022)   Humiliation, Afraid, Rape, and Kick questionnaire    Fear of Current or Ex-Partner: No    Emotionally Abused: No    Physically Abused: No    Sexually Abused: No     Allergies  Allergen Reactions   Amoxicillin-Pot Clavulanate Itching   Other Other (See Comments)    08/06/2018 -    06/05/2016 -    09/15/2014 -    06/16/2014 -     Outpatient Medications Prior to Visit  Medication Sig Dispense Refill   albuterol (PROVENTIL HFA;VENTOLIN  HFA) 108 (90 BASE) MCG/ACT inhaler Inhale 2 puffs into the lungs every 2 (two) hours as needed for wheezing or shortness of breath (cough). 1 Inhaler 0   albuterol (PROVENTIL) (2.5 MG/3ML) 0.083% nebulizer solution Take 3 mLs (2.5 mg total) by nebulization every 6 (six) hours as needed for wheezing or shortness of breath. 75 mL 12   atorvastatin (LIPITOR) 80 MG tablet Take 1 tablet (80 mg total) by mouth daily at 6 PM. 30 tablet 2   busPIRone (BUSPAR) 10 MG tablet Take 10 mg by mouth 2 (two) times daily.     Cholecalciferol (VITAMIN D) 50 MCG (2000 UT) tablet Take 2,000 Units by mouth daily.     clopidogrel (PLAVIX) 75 MG tablet Take 1 tablet (75 mg total) by mouth daily. 30 tablet 0   dapagliflozin propanediol (FARXIGA) 10 MG TABS tablet Take 1 tablet (10 mg total) by mouth daily. 30 tablet 5   desloratadine (CLARINEX) 5 MG tablet Take 5 mg by mouth daily.     ezetimibe (ZETIA) 10 MG tablet Take 10 mg by mouth daily.     fluconazole (DIFLUCAN) 200 MG tablet Take 200 mg by mouth daily.     FLUoxetine (PROZAC) 20 MG capsule Take 20 mg by mouth daily.     fluticasone (FLONASE) 50 MCG/ACT nasal spray Place 1 spray into both nostrils daily.     fluticasone-salmeterol (WIXELA INHUB) 500-50 MCG/ACT AEPB Inhale 1 puff into the lungs in the morning and at bedtime. 60 each 6   losartan (COZAAR) 25 MG tablet  Take 1/2 tablet (12.5 mg total) by mouth daily. 30 tablet 3   metoprolol succinate (TOPROL XL) 25 MG 24 hr tablet Take 0.5 tablets (12.5 mg total) by mouth at bedtime. 15 tablet 8   montelukast (SINGULAIR) 10 MG tablet Take 1 tablet (10 mg total) by mouth at bedtime. 30 tablet 11   pantoprazole (PROTONIX) 40 MG tablet TAKE 1 TABLET BY MOUTH EVERY DAY 90 tablet 1   potassium chloride SA (KLOR-CON M) 20 MEQ tablet Take 1 tablet (20 mEq total) by mouth every other day on the same days that you take torsemide. 15 tablet 11   spironolactone (ALDACTONE) 25 MG tablet Take 1/2 tablet (12.5 mg total) by mouth daily. 30 tablet 3   torsemide (DEMADEX) 20 MG tablet Take 0.5 tablets (10 mg total) by mouth every other day. 30 tablet 3   No facility-administered medications prior to visit.   Review of Systems  Constitutional:  Negative for chills, fever, malaise/fatigue and weight loss.  HENT:  Negative for congestion and sinus pain.   Eyes: Negative.   Respiratory:  Negative for cough, hemoptysis, sputum production, shortness of breath and wheezing.   Cardiovascular:  Negative for chest pain, palpitations, orthopnea, claudication and leg swelling.  Gastrointestinal:  Negative for abdominal pain, heartburn, nausea and vomiting.  Genitourinary: Negative.   Musculoskeletal:  Negative for joint pain and myalgias.  Skin:  Negative for rash.  Neurological:  Negative for weakness.  Endo/Heme/Allergies: Negative.   Psychiatric/Behavioral:  The patient is not nervous/anxious.     Objective:   Vitals:   03/25/22 1417  BP: 120/72  Pulse: (!) 101  SpO2: 99%  Weight: 132 lb (59.9 kg)  Height: '5\' 6"'$  (1.676 m)    Physical Exam Constitutional:      General: She is not in acute distress.    Appearance: She is not ill-appearing.  HENT:     Head: Normocephalic and atraumatic.  Eyes:  General: No scleral icterus.    Conjunctiva/sclera: Conjunctivae normal.  Cardiovascular:     Rate and Rhythm: Normal  rate and regular rhythm.     Pulses: Normal pulses.     Heart sounds: Normal heart sounds. No murmur heard. Pulmonary:     Effort: Pulmonary effort is normal.     Breath sounds: Normal breath sounds. No wheezing, rhonchi or rales.  Musculoskeletal:     Right lower leg: No edema.     Left lower leg: No edema.  Lymphadenopathy:     Cervical: No cervical adenopathy.  Skin:    General: Skin is warm and dry.  Neurological:     General: No focal deficit present.     Mental Status: She is alert.  Psychiatric:        Mood and Affect: Mood normal.        Behavior: Behavior normal.        Thought Content: Thought content normal.        Judgment: Judgment normal.    CBC    Component Value Date/Time   WBC 4.0 02/28/2022 1515   RBC 4.05 02/28/2022 1515   HGB 12.2 02/28/2022 1515   HGB 12.3 02/26/2022 1546   HCT 37.0 02/28/2022 1515   HCT 36.8 02/26/2022 1546   PLT 305 02/28/2022 1515   PLT 277 02/26/2022 1546   MCV 91.4 02/28/2022 1515   MCV 93 02/26/2022 1546   MCH 30.1 02/28/2022 1515   MCHC 33.0 02/28/2022 1515   RDW 16.4 (H) 02/28/2022 1515   RDW 14.0 02/26/2022 1546   LYMPHSABS 1.2 01/07/2022 1200   MONOABS 0.5 01/07/2022 1200   EOSABS 0.2 01/07/2022 1200   BASOSABS 0.0 01/07/2022 1200      Latest Ref Rng & Units 03/18/2022    3:40 PM 03/04/2022   12:30 AM 03/03/2022   12:58 AM  BMP  Glucose 70 - 99 mg/dL 98  131  124   BUN 8 - 23 mg/dL 35  18  19   Creatinine 0.44 - 1.00 mg/dL 2.03  1.05  1.31   Sodium 135 - 145 mmol/L 136  135  137   Potassium 3.5 - 5.1 mmol/L 3.6  3.7  3.5   Chloride 98 - 111 mmol/L 100  103  101   CO2 22 - 32 mmol/L '25  24  23   '$ Calcium 8.9 - 10.3 mg/dL 9.5  8.6  8.8    Chest imaging: CT PET Scan 02/20/22 1. The mediastinal adenopathy described on the prior chest CT is hypermetabolic. Favor neoplastic (lymphoproliferative disorder versus occult primary bronchogenic carcinoma) over reactive etiologies. Consider tissue sampling. 2. Improved  right lower lobe airspace disease, favoring resolving infection. 3. Increased right and new small left pleural effusions, suggesting pulmonary edema. 4. Left adrenal hypermetabolism without well-defined mass. Recommend attention on follow-up. 5. Incidental findings, including: Aortic atherosclerosis (ICD10-I70.0), coronary artery atherosclerosis and emphysema  CXR 01/23/22 Right lung opacities are noted consistent with multifocal pneumonia. Followup PA and lateral chest X-ray is recommended in 3-4 weeks following trial of antibiotic therapy to ensure resolution and exclude underlying malignancy.  CT Chest 12/31/21 1. New segmental consolidation with air bronchograms in the RIGHT lower lobe is concerning for pneumonia. Recommend clinical correlation for pulmonary infection. 2. New RIGHT paratracheal adenopathy with differential including reactive adenopathy related to RIGHT lower lobe pneumonia versus metastatic adenopathy. The nodes are larger than typically present with reactive adenopathy. Patient may benefit from an FDG PET scan if no signs  of current pulmonary infection. Regardless patient warts follow-up CT scan to demonstrate resolution pneumonia and adenopathy. 3. Resolution of the LEFT upper lobe nodule of concern. 4. Mild atelectasis and effusion in the posterior RIGHT upper lobe.  CT Chest 06/20/21 Spiculated lesion in the left upper lobe with associated small nodule suspicious for neoplasm till proven otherwise. Multidisciplinary workup is recommended.   Focal infiltrate in the medial aspect of the right lower lobe.   Aortic Atherosclerosis (ICD10-I70.0) and Emphysema  CT Coronary Scan 04/21/20 1.  No acute findings in the imaged extracardiac chest. 2.  Aortic Atherosclerosis (ICD10-I70.0). 3. Right middle lobe 4 mm pulmonary nodule. No follow-up needed if patient is low-risk. Non-contrast chest CT can be considered in 12 months if patient is high-risk. This  recommendation follows the consensus statement: Guidelines for Management of Incidental Pulmonary Nodules Detected on CT Images: From the Fleischner Society 2017; Radiology 2017; 284:228-243. 4.  Tiny hiatal hernia.  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 LV mildly dilated. RV size and systolic function are normal.   Heart Catheterization:  Assessment & Plan:   Mediastinal adenopathy  Moderate persistent asthma without complication  Nonrheumatic mitral valve regurgitation  Heart failure with reduced ejection fraction (HCC)  Discussion: Shannon [name] is a 72 year old woman, former smoker with hypertension, GERD and asthma who returns to pulmonary clinic for emphysema and abnormal chest imaging.   She has pet avid mediastinal lymph nodes in the 4R region which we will plan for EBUS in the coming weeks. She will need to hold plavix 5 days prior to procedure.   Cardiology is management heart failure and pulmonary hypertension issues, patient is feeling significantly better.   She is to continue wixella inhub 1 puff twice daily.   Follow up in 6 weeks.  Shannon Jackson, MD [facility] Office: [phone]    Current Outpatient Medications:    albuterol (PROVENTIL HFA;VENTOLIN HFA) 108 (90 BASE) MCG/ACT inhaler, Inhale 2 puffs into the lungs every 2 (two) hours as needed for wheezing or shortness of breath  (cough)., Disp: 1 Inhaler, Rfl: 0   albuterol (PROVENTIL) (2.5 MG/3ML) 0.083% nebulizer solution, Take 3 mLs (2.5 mg total) by nebulization every 6 (six) hours as needed for wheezing or shortness of breath., Disp: 75 mL, Rfl: 12   atorvastatin (LIPITOR) 80 MG tablet, Take 1 tablet (80 mg total) by mouth daily at 6 PM., Disp: 30 tablet, Rfl: 2   busPIRone (BUSPAR) 10 MG tablet, Take 10 mg by mouth 2 (two) times daily., Disp: , Rfl:    Cholecalciferol (VITAMIN D) 50 MCG (2000 UT) tablet, Take 2,000 Units by mouth daily., Disp: , Rfl:    clopidogrel (PLAVIX) 75 MG tablet, Take 1  tablet (75 mg total) by mouth daily., Disp: 30 tablet, Rfl: 0   dapagliflozin propanediol (FARXIGA) 10 MG TABS tablet, Take 1 tablet (10 mg total) by mouth daily., Disp: 30 tablet, Rfl: 5   desloratadine (CLARINEX) 5 MG tablet, Take 5 mg by mouth daily., Disp: , Rfl:    ezetimibe (ZETIA) 10 MG tablet, Take 10 mg by mouth daily., Disp: , Rfl:    fluconazole (DIFLUCAN) 200 MG tablet, Take 200 mg by mouth daily., Disp: , Rfl:    FLUoxetine (PROZAC) 20 MG capsule, Take 20 mg by mouth daily., Disp: , Rfl:    fluticasone (FLONASE) 50 MCG/ACT nasal spray, Place 1 spray into both nostrils daily., Disp: , Rfl:    fluticasone-salmeterol (WIXELA INHUB) 500-50 MCG/ACT AEPB, Inhale 1 puff into the lungs in the morning and at bedtime., Disp: 60 each, Rfl: 6   losartan (COZAAR) 25 MG tablet, Take 1/2 tablet (12.5 mg total) by mouth daily., Disp: 30 tablet, Rfl: 3   metoprolol succinate (TOPROL XL) 25 MG 24 hr tablet, Take 0.5 tablets (12.5 mg total) by mouth at bedtime., Disp: 15 tablet, Rfl: 8   montelukast (SINGULAIR) 10 MG tablet, Take 1 tablet (10 mg total) by mouth at bedtime., Disp: 30 tablet, Rfl: 11   pantoprazole (PROTONIX) 40 MG tablet, TAKE 1 TABLET BY MOUTH EVERY DAY, Disp: 90 tablet, Rfl: 1   potassium chloride SA (KLOR-CON M) 20 MEQ tablet, Take 1 tablet (20 mEq total) by mouth every other day on the same days that you take  torsemide., Disp: 15 tablet, Rfl: 11   spironolactone (ALDACTONE) 25 MG tablet, Take 1/2 tablet (12.5 mg total) by mouth daily., Disp: 30 tablet, Rfl: 3   torsemide (DEMADEX) 20 MG tablet, Take 0.5 tablets (10 mg total) by mouth every other day., Disp: 30 tablet, Rfl: 3

## 2022-03-25 NOTE — Progress Notes (Unsigned)
Synopsis: Referred in March 2023 for shortness of breath by Vanita Panda, MD  Subjective:   PATIENT ID: Shannon [name] GENDER: female DOB: 09/04/1950, MRN: [id number]  HPI  Chief Complaint  Patient presents with   Follow-up    F/U after [place]. States she has been doing well since last visit.    Shannon [name] is a 72 year old woman, former smoker with hypertension, GERD and asthma who returns to pulmonary clinic for emphysema and abnormal chest imaging.   She was recently admitted 1/11 to 1/15 for heart failure management and significant mitral valve regurgication. She had L/RHC which showed no significant CAD, only 10% pRCA narrowing, elevated R+ L heart filling pressures and marginal output, (PA saturation 48%, mean RA pressure 18 mmHg, PA pressure 50/28, mean PA pressure 37 mmHg, pulmonary capillary wedge pressure 35/36, mean pulmonary capillary wedge pressure 30 mmHg, cardiac output 3.92 L/min, cardiac index 2.27).   She had EGD done during admission which did not indicate any obvious reason for dysphagia except for possible thrush in a small areas, her esophagus was dilated though and treated with fluconazole.   She is overall feeling much better. She is eating well. Her shortness of breath is significantly  better. She denies any fevers or chills.   PET CT scan shows avid mediastinal adenopathy of the 4R region. EBUS was planned previously but post-poned due to acute cardiac issues.   OV 02/19/22 She was treated with extended steroid taper after last visit which seemed to help her feel better but after completion she continues to have significant fatigue. She was not able to have her EGD done due to being on clopidogrel with not knowing if she was able to stop it temporarily. She has lost 5lbs since last visit. She is still not eating or drinking well due to trouble swallowing.   She continues to have productive cough. No fevers, chills or sweats.  OV 01/23/22 Dizzy,  fatigue, decreased appetite. No fevers, chills. + Night sweats.  Cough and sputum production are better. Nausea and vomiting since last week.   Urine has turned dark brown, but is clearing up now.   She is getting her esophagus stretch on 12/19.   Joint aches in legs  OV 01/07/22 She reports about an 8lb weight loss since last visit with productive cough and some night sweats. Repeat CT Chest scan shows resolution of the LUL infiltrate that was biopsy positive for aspergillus but new right sided findings with segmental consolidation with air bronchograms in right lower lobe, new right paratracheal adenopathy and mild atelectasis and effusion in the posterior right upper lobe.   She does report dysphagia and has an appointment to see GI later this month regarding possible need for EGD and dilation which she has required in the past.   OV 08/08/21 She has done well since hospitaliztion in May. She had nav bronch performed on 5/23 for RUL nodule which was negative for malignancy. BAL grew aspergillus.   She continues on advair 500-57mg 1 puff twice daily.   PFTs today show moderate diffusion defect.   Initial OV 05/03/21 Patient reports using Advair 500-50 mcg 1 puff twice daily over the last 15 to 20 years without much issue until recently she has noticed increased wheezing in the evening at bedtime.  She uses albuterol inhaler with relief of her wheezing.  She denies nighttime awakenings from her sleep with cough wheezing or shortness of breath.  Cold air and hot humid days seem  to bother her asthma symptoms along with strong cleaning agents.  She denies any issues with strong perfumes or colognes.  She also reports increased reflux over recent months and has complaints of food getting stuck in her chest similar to before when she required esophageal dilation by gastroenterology.  She has upcoming appointment with her GI team for follow-up.  She is eating 1 hour prior to bedtime.  Seasonal  allergies can also affect her asthma symptoms especially spring season.  She is retired and previously worked at [employer] as a Training and development officer.  She is a former smoker and quit in 2000.  She smoked half a pack a day for 30 years.  She was exposed to secondhand smoke in childhood.  She currently lives with her son.  Past Medical History:  Diagnosis Date   Anxiety    Arthritis    "right knee" (05/26/2014)   Asthma    Chronic lower back pain    Dysrhythmia    irregular heart beat   GERD (gastroesophageal reflux disease)    HLD (hyperlipidemia)    Hypertension    Pneumonia    Stroke [facility])    TIA - ?2021   TIA (transient ischemic attack) 04/2018     Family History  Problem Relation Age of Onset   Hypertension Mother    Hypertension Father    Colon cancer Father    Hypertension Sister      Social History   Socioeconomic History   Marital status: Widowed    Spouse name: Not on file   Number of children: Not on file   Years of education: Not on file   Highest education level: Not on file  Occupational History   Not on file  Tobacco Use   Smoking status: Former    Packs/day: 0.50    Years: 30.00    Total pack years: 15.00    Types: Cigarettes    Quit date: 11/10/1998    Years since quitting: 23.3   Smokeless tobacco: Never  Vaping Use   Vaping Use: Never used  Substance and Sexual Activity   Alcohol use: Yes    Comment: occasional   Drug use: No   Sexual activity: Not Currently  Other Topics Concern   Not on file  Social History Narrative   Not on file   Social Determinants of Health   Financial Resource Strain: Not on file  Food Insecurity: No Food Insecurity (02/28/2022)   Hunger Vital Sign    Worried About Running Out of Food in the Last Year: Never true    Ran Out of Food in the Last Year: Never true  Transportation Needs: No Transportation Needs (02/28/2022)   PRAPARE - Hydrologist (Medical): No    Lack of Transportation  (Non-Medical): No  Physical Activity: Not on file  Stress: Not on file  Social Connections: Not on file  Intimate Partner Violence: Not At Risk (02/28/2022)   Humiliation, Afraid, Rape, and Kick questionnaire    Fear of Current or Ex-Partner: No    Emotionally Abused: No    Physically Abused: No    Sexually Abused: No     Allergies  Allergen Reactions   Amoxicillin-Pot Clavulanate Itching   Other Other (See Comments)    08/06/2018 -    06/05/2016 -    09/15/2014 -    06/16/2014 -     Outpatient Medications Prior to Visit  Medication Sig Dispense Refill   albuterol (PROVENTIL HFA;VENTOLIN  HFA) 108 (90 BASE) MCG/ACT inhaler Inhale 2 puffs into the lungs every 2 (two) hours as needed for wheezing or shortness of breath (cough). 1 Inhaler 0   albuterol (PROVENTIL) (2.5 MG/3ML) 0.083% nebulizer solution Take 3 mLs (2.5 mg total) by nebulization every 6 (six) hours as needed for wheezing or shortness of breath. 75 mL 12   atorvastatin (LIPITOR) 80 MG tablet Take 1 tablet (80 mg total) by mouth daily at 6 PM. 30 tablet 2   busPIRone (BUSPAR) 10 MG tablet Take 10 mg by mouth 2 (two) times daily.     Cholecalciferol (VITAMIN D) 50 MCG (2000 UT) tablet Take 2,000 Units by mouth daily.     clopidogrel (PLAVIX) 75 MG tablet Take 1 tablet (75 mg total) by mouth daily. 30 tablet 0   dapagliflozin propanediol (FARXIGA) 10 MG TABS tablet Take 1 tablet (10 mg total) by mouth daily. 30 tablet 5   desloratadine (CLARINEX) 5 MG tablet Take 5 mg by mouth daily.     ezetimibe (ZETIA) 10 MG tablet Take 10 mg by mouth daily.     fluconazole (DIFLUCAN) 200 MG tablet Take 200 mg by mouth daily.     FLUoxetine (PROZAC) 20 MG capsule Take 20 mg by mouth daily.     fluticasone (FLONASE) 50 MCG/ACT nasal spray Place 1 spray into both nostrils daily.     fluticasone-salmeterol (WIXELA INHUB) 500-50 MCG/ACT AEPB Inhale 1 puff into the lungs in the morning and at bedtime. 60 each 6   losartan (COZAAR) 25 MG tablet  Take 1/2 tablet (12.5 mg total) by mouth daily. 30 tablet 3   metoprolol succinate (TOPROL XL) 25 MG 24 hr tablet Take 0.5 tablets (12.5 mg total) by mouth at bedtime. 15 tablet 8   montelukast (SINGULAIR) 10 MG tablet Take 1 tablet (10 mg total) by mouth at bedtime. 30 tablet 11   pantoprazole (PROTONIX) 40 MG tablet TAKE 1 TABLET BY MOUTH EVERY DAY 90 tablet 1   potassium chloride SA (KLOR-CON M) 20 MEQ tablet Take 1 tablet (20 mEq total) by mouth every other day on the same days that you take torsemide. 15 tablet 11   spironolactone (ALDACTONE) 25 MG tablet Take 1/2 tablet (12.5 mg total) by mouth daily. 30 tablet 3   torsemide (DEMADEX) 20 MG tablet Take 0.5 tablets (10 mg total) by mouth every other day. 30 tablet 3   No facility-administered medications prior to visit.   Review of Systems  Constitutional:  Negative for chills, fever, malaise/fatigue and weight loss.  HENT:  Negative for congestion and sinus pain.   Eyes: Negative.   Respiratory:  Negative for cough, hemoptysis, sputum production, shortness of breath and wheezing.   Cardiovascular:  Negative for chest pain, palpitations, orthopnea, claudication and leg swelling.  Gastrointestinal:  Negative for abdominal pain, heartburn, nausea and vomiting.  Genitourinary: Negative.   Musculoskeletal:  Negative for joint pain and myalgias.  Skin:  Negative for rash.  Neurological:  Negative for weakness.  Endo/Heme/Allergies: Negative.   Psychiatric/Behavioral:  The patient is not nervous/anxious.     Objective:   Vitals:   03/25/22 1417  BP: 120/72  Pulse: (!) 101  SpO2: 99%  Weight: 132 lb (59.9 kg)  Height: '5\' 6"'$  (1.676 m)    Physical Exam Constitutional:      General: She is not in acute distress.    Appearance: She is not ill-appearing.  HENT:     Head: Normocephalic and atraumatic.  Eyes:  General: No scleral icterus.    Conjunctiva/sclera: Conjunctivae normal.  Cardiovascular:     Rate and Rhythm: Normal  rate and regular rhythm.     Pulses: Normal pulses.     Heart sounds: Normal heart sounds. No murmur heard. Pulmonary:     Effort: Pulmonary effort is normal.     Breath sounds: Normal breath sounds. No wheezing, rhonchi or rales.  Musculoskeletal:     Right lower leg: No edema.     Left lower leg: No edema.  Lymphadenopathy:     Cervical: No cervical adenopathy.  Skin:    General: Skin is warm and dry.  Neurological:     General: No focal deficit present.     Mental Status: She is alert.  Psychiatric:        Mood and Affect: Mood normal.        Behavior: Behavior normal.        Thought Content: Thought content normal.        Judgment: Judgment normal.    CBC    Component Value Date/Time   WBC 4.0 02/28/2022 1515   RBC 4.05 02/28/2022 1515   HGB 12.2 02/28/2022 1515   HGB 12.3 02/26/2022 1546   HCT 37.0 02/28/2022 1515   HCT 36.8 02/26/2022 1546   PLT 305 02/28/2022 1515   PLT 277 02/26/2022 1546   MCV 91.4 02/28/2022 1515   MCV 93 02/26/2022 1546   MCH 30.1 02/28/2022 1515   MCHC 33.0 02/28/2022 1515   RDW 16.4 (H) 02/28/2022 1515   RDW 14.0 02/26/2022 1546   LYMPHSABS 1.2 01/07/2022 1200   MONOABS 0.5 01/07/2022 1200   EOSABS 0.2 01/07/2022 1200   BASOSABS 0.0 01/07/2022 1200      Latest Ref Rng & Units 03/18/2022    3:40 PM 03/04/2022   12:30 AM 03/03/2022   12:58 AM  BMP  Glucose 70 - 99 mg/dL 98  131  124   BUN 8 - 23 mg/dL 35  18  19   Creatinine 0.44 - 1.00 mg/dL 2.03  1.05  1.31   Sodium 135 - 145 mmol/L 136  135  137   Potassium 3.5 - 5.1 mmol/L 3.6  3.7  3.5   Chloride 98 - 111 mmol/L 100  103  101   CO2 22 - 32 mmol/L '25  24  23   '$ Calcium 8.9 - 10.3 mg/dL 9.5  8.6  8.8    Chest imaging: CT PET Scan 02/20/22 1. The mediastinal adenopathy described on the prior chest CT is hypermetabolic. Favor neoplastic (lymphoproliferative disorder versus occult primary bronchogenic carcinoma) over reactive etiologies. Consider tissue sampling. 2. Improved  right lower lobe airspace disease, favoring resolving infection. 3. Increased right and new small left pleural effusions, suggesting pulmonary edema. 4. Left adrenal hypermetabolism without well-defined mass. Recommend attention on follow-up. 5. Incidental findings, including: Aortic atherosclerosis (ICD10-I70.0), coronary artery atherosclerosis and emphysema  CXR 01/23/22 Right lung opacities are noted consistent with multifocal pneumonia. Followup PA and lateral chest X-ray is recommended in 3-4 weeks following trial of antibiotic therapy to ensure resolution and exclude underlying malignancy.  CT Chest 12/31/21 1. New segmental consolidation with air bronchograms in the RIGHT lower lobe is concerning for pneumonia. Recommend clinical correlation for pulmonary infection. 2. New RIGHT paratracheal adenopathy with differential including reactive adenopathy related to RIGHT lower lobe pneumonia versus metastatic adenopathy. The nodes are larger than typically present with reactive adenopathy. Patient may benefit from an FDG PET scan if no signs  of current pulmonary infection. Regardless patient warts follow-up CT scan to demonstrate resolution pneumonia and adenopathy. 3. Resolution of the LEFT upper lobe nodule of concern. 4. Mild atelectasis and effusion in the posterior RIGHT upper lobe.  CT Chest 06/20/21 Spiculated lesion in the left upper lobe with associated small nodule suspicious for neoplasm till proven otherwise. Multidisciplinary workup is recommended.   Focal infiltrate in the medial aspect of the right lower lobe.   Aortic Atherosclerosis (ICD10-I70.0) and Emphysema  CT Coronary Scan 04/21/20 1.  No acute findings in the imaged extracardiac chest. 2.  Aortic Atherosclerosis (ICD10-I70.0). 3. Right middle lobe 4 mm pulmonary nodule. No follow-up needed if patient is low-risk. Non-contrast chest CT can be considered in 12 months if patient is high-risk. This  recommendation follows the consensus statement: Guidelines for Management of Incidental Pulmonary Nodules Detected on CT Images: From the Fleischner Society 2017; Radiology 2017; 284:228-243. 4.  Tiny hiatal hernia.  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 LV mildly dilated. RV size and systolic function are normal.   Heart Catheterization:  Assessment & Plan:   Mediastinal adenopathy  Moderate persistent asthma without complication  Nonrheumatic mitral valve regurgitation  Heart failure with reduced ejection fraction (HCC)  Discussion: Shannon [name] is a 72 year old woman, former smoker with hypertension, GERD and asthma who returns to pulmonary clinic for emphysema and abnormal chest imaging.   She has pet avid mediastinal lymph nodes in the 4R region which we will plan for EBUS in the coming weeks. She will need to hold plavix 5 days prior to procedure.   Cardiology is management heart failure and pulmonary hypertension issues, patient is feeling significantly better.   She is to continue wixella inhub 1 puff twice daily.   Follow up in 6 weeks.  Freda Jackson, MD [facility] Office: [phone]    Current Outpatient Medications:    albuterol (PROVENTIL HFA;VENTOLIN HFA) 108 (90 BASE) MCG/ACT inhaler, Inhale 2 puffs into the lungs every 2 (two) hours as needed for wheezing or shortness of breath  (cough)., Disp: 1 Inhaler, Rfl: 0   albuterol (PROVENTIL) (2.5 MG/3ML) 0.083% nebulizer solution, Take 3 mLs (2.5 mg total) by nebulization every 6 (six) hours as needed for wheezing or shortness of breath., Disp: 75 mL, Rfl: 12   atorvastatin (LIPITOR) 80 MG tablet, Take 1 tablet (80 mg total) by mouth daily at 6 PM., Disp: 30 tablet, Rfl: 2   busPIRone (BUSPAR) 10 MG tablet, Take 10 mg by mouth 2 (two) times daily., Disp: , Rfl:    Cholecalciferol (VITAMIN D) 50 MCG (2000 UT) tablet, Take 2,000 Units by mouth daily., Disp: , Rfl:    clopidogrel (PLAVIX) 75 MG tablet, Take 1  tablet (75 mg total) by mouth daily., Disp: 30 tablet, Rfl: 0   dapagliflozin propanediol (FARXIGA) 10 MG TABS tablet, Take 1 tablet (10 mg total) by mouth daily., Disp: 30 tablet, Rfl: 5   desloratadine (CLARINEX) 5 MG tablet, Take 5 mg by mouth daily., Disp: , Rfl:    ezetimibe (ZETIA) 10 MG tablet, Take 10 mg by mouth daily., Disp: , Rfl:    fluconazole (DIFLUCAN) 200 MG tablet, Take 200 mg by mouth daily., Disp: , Rfl:    FLUoxetine (PROZAC) 20 MG capsule, Take 20 mg by mouth daily., Disp: , Rfl:    fluticasone (FLONASE) 50 MCG/ACT nasal spray, Place 1 spray into both nostrils daily., Disp: , Rfl:    fluticasone-salmeterol (WIXELA INHUB) 500-50 MCG/ACT AEPB, Inhale 1 puff into the lungs in the morning and at bedtime., Disp: 60 each, Rfl: 6   losartan (COZAAR) 25 MG tablet, Take 1/2 tablet (12.5 mg total) by mouth daily., Disp: 30 tablet, Rfl: 3   metoprolol succinate (TOPROL XL) 25 MG 24 hr tablet, Take 0.5 tablets (12.5 mg total) by mouth at bedtime., Disp: 15 tablet, Rfl: 8   montelukast (SINGULAIR) 10 MG tablet, Take 1 tablet (10 mg total) by mouth at bedtime., Disp: 30 tablet, Rfl: 11   pantoprazole (PROTONIX) 40 MG tablet, TAKE 1 TABLET BY MOUTH EVERY DAY, Disp: 90 tablet, Rfl: 1   potassium chloride SA (KLOR-CON M) 20 MEQ tablet, Take 1 tablet (20 mEq total) by mouth every other day on the same days that you take  torsemide., Disp: 15 tablet, Rfl: 11   spironolactone (ALDACTONE) 25 MG tablet, Take 1/2 tablet (12.5 mg total) by mouth daily., Disp: 30 tablet, Rfl: 3   torsemide (DEMADEX) 20 MG tablet, Take 0.5 tablets (10 mg total) by mouth every other day., Disp: 30 tablet, Rfl: 3

## 2022-03-25 NOTE — Patient Instructions (Addendum)
We will have you scheduled for EBUS procedure for lymph node biopsies based on the recent PET scan activity.  I am glad you are feeling better and able to eat more!  Follow up in 6 weeks

## 2022-03-27 ENCOUNTER — Encounter: Payer: Self-pay | Admitting: Pulmonary Disease

## 2022-04-03 ENCOUNTER — Telehealth: Payer: Self-pay | Admitting: Pulmonary Disease

## 2022-04-03 ENCOUNTER — Encounter: Payer: Self-pay | Admitting: Pulmonary Disease

## 2022-04-03 ENCOUNTER — Other Ambulatory Visit (HOSPITAL_COMMUNITY): Payer: Self-pay

## 2022-04-03 DIAGNOSIS — R599 Enlarged lymph nodes, unspecified: Secondary | ICD-10-CM | POA: Insufficient documentation

## 2022-04-03 NOTE — Telephone Encounter (Signed)
Pt has been scheduled for 2/27.  Appt info in referral.

## 2022-04-03 NOTE — Telephone Encounter (Signed)
-----   Message from Freddi Starr, MD sent at 03/27/2022  3:17 PM EST ----- Regarding: EBUS for PET avid nodes Hi Brad,  Would you be able to get her in for EBUS over the next couple of weeks. She has pet avid adenopathy, was recently treated for heart failure and she is feeling much better. You bronch'd her before for LUL nodule that grew aspergillus and I elected not to treat and it cleared on it's own but then she developed these on findings on right side before the PET was done.   The earliest I could do it would be March 1 as that is the next time I am in hospital.  Thanks, Wille Glaser

## 2022-04-04 NOTE — Progress Notes (Signed)
Office Visit    Patient Name: Shannon [name] Date of Encounter: 04/04/2022  Primary Care Provider:  Demetrios Isaacs, FNP Primary Cardiologist:  Quay Burow, MD Primary Electrophysiologist: None  Chief Complaint    Shannon [name] is a 72 y.o. female with PMH of HFrEF, MVR, TVR, HTN, HLD, right brain TIA/stroke (Plavix), GERD, anxiety who presents today for 1 month follow-up of mitral regurgitation.  Past Medical History    Past Medical History:  Diagnosis Date   Anxiety    Arthritis    "right knee" (05/26/2014)   Asthma    Chronic lower back pain    Dysrhythmia    irregular heart beat   GERD (gastroesophageal reflux disease)    HLD (hyperlipidemia)    Hypertension    Pneumonia    Stroke [facility])    TIA - ?2021   TIA (transient ischemic attack) 04/2018   Past Surgical History:  Procedure Laterality Date   BRONCHIAL BIOPSY  07/10/2021   Procedure: BRONCHIAL BIOPSIES;  Surgeon: Garner Nash, DO;  Location: [place] [facility];  Service: Pulmonary;;   BRONCHIAL BRUSHINGS  07/10/2021   Procedure: BRONCHIAL BRUSHINGS;  Surgeon: Garner Nash, DO;  Location: [facility];  Service: Pulmonary;;   BRONCHIAL NEEDLE ASPIRATION BIOPSY  07/10/2021   Procedure: BRONCHIAL NEEDLE ASPIRATION BIOPSIES;  Surgeon: Garner Nash, DO;  Location: [place] [facility];  Service: Pulmonary;;   BRONCHIAL WASHINGS  07/10/2021   Procedure: BRONCHIAL WASHINGS;  Surgeon: Garner Nash, DO;  Location: [place] [facility];  Service: Pulmonary;;   COLONOSCOPY WITH PROPOFOL N/A 03/03/2022   Procedure: COLONOSCOPY WITH PROPOFOL;  Surgeon: Carol Ada, MD;  Location: [place];  Service: Gastroenterology;  Laterality: N/A;   ESOPHAGOGASTRODUODENOSCOPY N/A 05/27/2014   Procedure: ESOPHAGOGASTRODUODENOSCOPY (EGD);  Surgeon: Carol Ada, MD;  Location: [name];  Service: Endoscopy;  Laterality: N/A;   ESOPHAGOGASTRODUODENOSCOPY (EGD) WITH PROPOFOL N/A 03/03/2022   Procedure: ESOPHAGOGASTRODUODENOSCOPY  (EGD) WITH PROPOFOL;  Surgeon: Carol Ada, MD;  Location: [place];  Service: Gastroenterology;  Laterality: N/A;   FRACTURE SURGERY     PATELLA FRACTURE SURGERY Right ~ 2009   RIGHT/LEFT HEART CATH AND CORONARY ANGIOGRAPHY N/A 02/28/2022   Procedure: RIGHT/LEFT HEART CATH AND CORONARY ANGIOGRAPHY;  Surgeon: Jettie Booze, MD;  Location: [place] [facility];  Service: Cardiovascular;  Laterality: N/A;   SAVORY DILATION N/A 03/03/2022   Procedure: SAVORY DILATION;  Surgeon: Carol Ada, MD;  Location: [place];  Service: Gastroenterology;  Laterality: N/A;   TEE WITHOUT CARDIOVERSION N/A 03/01/2022   Procedure: TRANSESOPHAGEAL ECHOCARDIOGRAM (TEE);  Surgeon: Larey Dresser, MD;  Location: [facility];  Service: Cardiovascular;  Laterality: N/A;   TONSILLECTOMY  ~ [place]  1980's   VIDEO BRONCHOSCOPY WITH RADIAL ENDOBRONCHIAL ULTRASOUND  07/10/2021   Procedure: RADIAL ENDOBRONCHIAL ULTRASOUND;  Surgeon: Garner Nash, DO;  Location: [place] [facility];  Service: Pulmonary;;    Allergies  Allergies  Allergen Reactions   Amoxicillin-Pot Clavulanate Itching   Other Other (See Comments)    08/06/2018 -    06/05/2016 -    09/15/2014 -    06/16/2014 -    History of Present Illness    Shannon [name]  is a 72year old female with the above mention past medical history who presents today for preoperative clearance.  Shannon [name] was initially seen by Dr.Berry in 2021 for evaluation of palpitations.  She wore 2-week ZIO monitor to rule out AF that revealed occasional PACs/PVCs with short runs of PSVT  and NSVT.  She was admitted 04/2018 with complaint of left-sided arm weakness and found to have right-sided TIA with small vessel disease.  She was started on DAPT with ASA and Plavix.  2D echo was completed 08/2019 with normal LV function noted.  She underwent obstructive sleep apnea study that revealed no indication for CPAP therapy.  She had calcium scoring  completed 04/21/2020 that revealed score of 507 with aortic atherosclerosis noted and patient was started on atorvastatin for secondary prevention.  She was last seen by Nicholes Rough, PA on 01/25/2022 for complaint of increased shortness of breath and tachycardia.  Carvedilol 3.125 mg twice daily was added and 2D echo was repeated to rule out structural causes of shortness of breath with EF of 40-45% mildly decreased LV function and global hypokinesis, severely dilated LA and mildly dilated RA with moderate to severe mitral valve regurg and moderate to severe TV regurg, mild aortic valve regurg.  The ascending aorta is also mildly dilated at 36 mm.  She presented for surgical clearance on 02/26/2022 but was unable to be cleared due to severe MVR and TVR on recent 2D echo.  She was instead consented for [facility] and TEE for further evaluation of TVR and MVR. Echo done showed drop in EF to 40-45%. Referred for [facility]. Underwent cath which showed no significant CAD and elevated R+ L heart filling pressures and marginal output .  She was directly admitted and diuresed well.  Cardiac MRI showed severe AI and moderate severe in R.  EGD was completed and was unremarkable with no definitive cause of dysphagia.  Plan is for structural heart team to review TEE to decide if further course of treatment.  She was seen for Shannon [name] visit on 03/18/2022 by Dr. Aundra Dubin.  During visit patient reported feeling fine overall with no dyspnea on walking while grocery shopping.  She was not volume overloaded on exam and was encouraged to continue current GDMT with Lisabeth Register, torsemide, Toprol, and spironolactone.  Structural heart team reviewed and patient was not suitable for mTEER.  She was referred to Dr. Lavonna Monarch for evaluation for surgical mitral valve replacement as well as AVR.  She is still pending evaluation of mediastinal adenopathy which will occur ahead of EBUS.   Since last being seen in the office patient reports she is doing well  with some improvement to her shortness of breath.  She has less shortness of breath since her medication adjustments were made in the heart failure clinic.  She is scheduled to have her pulmonary procedure completed on 04/16/2022.  She has been referred to Dr. Lavonna Monarch for evaluation of surgical MVR but has not received a call from his office regarding scheduling.  Her blood pressure today was 100/58 heart rate was 94 bpm.  I encouraged her to check her blood pressures at home moving forward.  She was euvolemic on exam today and has been very mindful of her sodium intake.  She has tried light salt and I advised her to not use this due to increased potassium that sometimes is an ingredient.  Patient denies chest pain, palpitations, dyspnea, PND, orthopnea, nausea, vomiting, dizziness, syncope, edema, weight gain, or early satiety.  Home Medications    Current Outpatient Medications  Medication Sig Dispense Refill   albuterol (PROVENTIL HFA;VENTOLIN HFA) 108 (90 BASE) MCG/ACT inhaler Inhale 2 puffs into the lungs every 2 (two) hours as needed for wheezing or shortness of breath (cough). 1 Inhaler 0   albuterol (PROVENTIL) (2.5 MG/3ML) 0.083%  nebulizer solution Take 3 mLs (2.5 mg total) by nebulization every 6 (six) hours as needed for wheezing or shortness of breath. 75 mL 12   atorvastatin (LIPITOR) 80 MG tablet Take 1 tablet (80 mg total) by mouth daily at 6 PM. 30 tablet 2   busPIRone (BUSPAR) 10 MG tablet Take 10 mg by mouth 2 (two) times daily.     Cholecalciferol (VITAMIN D) 50 MCG (2000 UT) tablet Take 2,000 Units by mouth daily.     clopidogrel (PLAVIX) 75 MG tablet Take 1 tablet (75 mg total) by mouth daily. 30 tablet 0   dapagliflozin propanediol (FARXIGA) 10 MG TABS tablet Take 1 tablet (10 mg total) by mouth daily. 30 tablet 5   desloratadine (CLARINEX) 5 MG tablet Take 5 mg by mouth daily.     ezetimibe (ZETIA) 10 MG tablet Take 10 mg by mouth daily.     fluconazole (DIFLUCAN) 200 MG tablet  Take 200 mg by mouth daily.     FLUoxetine (PROZAC) 20 MG capsule Take 20 mg by mouth daily.     fluticasone (FLONASE) 50 MCG/ACT nasal spray Place 1 spray into both nostrils daily.     fluticasone-salmeterol (WIXELA INHUB) 500-50 MCG/ACT AEPB Inhale 1 puff into the lungs in the morning and at bedtime. 60 each 6   losartan (COZAAR) 25 MG tablet Take 1/2 tablet (12.5 mg total) by mouth daily. 30 tablet 3   metoprolol succinate (TOPROL XL) 25 MG 24 hr tablet Take 0.5 tablets (12.5 mg total) by mouth at bedtime. 15 tablet 8   montelukast (SINGULAIR) 10 MG tablet Take 1 tablet (10 mg total) by mouth at bedtime. 30 tablet 11   pantoprazole (PROTONIX) 40 MG tablet TAKE 1 TABLET BY MOUTH EVERY DAY 90 tablet 1   potassium chloride SA (KLOR-CON M) 20 MEQ tablet Take 1 tablet (20 mEq total) by mouth every other day on the same days that you take torsemide. 15 tablet 11   spironolactone (ALDACTONE) 25 MG tablet Take 1/2 tablet (12.5 mg total) by mouth daily. 30 tablet 3   torsemide (DEMADEX) 20 MG tablet Take 0.5 tablets (10 mg total) by mouth every other day. 30 tablet 3   No current facility-administered medications for this visit.     Review of Systems  Please see the history of present illness.    (+) Shortness of breath  All other systems reviewed and are otherwise negative except as noted above.  Physical Exam    Wt Readings from Last 3 Encounters:  03/25/22 132 lb (59.9 kg)  03/18/22 134 lb (60.8 kg)  03/13/22 134 lb 11.2 oz (61.1 kg)   [id number] were no vitals filed for this visit.,There is no height or weight on file to calculate BMI.  Constitutional:      Appearance: Healthy appearance. Not in distress.  Neck:     Vascular: JVD normal.  Pulmonary:     Effort: Pulmonary effort is normal.     Breath sounds: No wheezing. No rales. Diminished in the bases Cardiovascular:     Normal rate. Regular rhythm. Normal S1. Normal S2.      Murmurs: There is no murmur.  Edema:    Peripheral  edema absent.  Abdominal:     Palpations: Abdomen is soft non tender. There is no hepatomegaly.  Skin:    General: Skin is warm and dry.  Neurological:     General: No focal deficit present.     Mental Status: Alert and oriented to person,  place and time.     Cranial Nerves: Cranial nerves are intact.  EKG/LABS/Other Studies Reviewed    ECG personally reviewed by me today -none completed today       Lab Results  Component Value Date   WBC 4.0 02/28/2022   HGB 12.2 02/28/2022   HCT 37.0 02/28/2022   MCV 91.4 02/28/2022   PLT 305 02/28/2022   Lab Results  Component Value Date   CREATININE 2.03 (H) 03/18/2022   BUN 35 (H) 03/18/2022   NA 136 03/18/2022   K 3.6 03/18/2022   CL 100 03/18/2022   CO2 25 03/18/2022   Lab Results  Component Value Date   ALT 44 03/18/2022   AST 41 03/18/2022   ALKPHOS 92 03/18/2022   BILITOT 0.6 03/18/2022   Lab Results  Component Value Date   CHOL 193 06/17/2019   HDL 71 06/17/2019   LDLCALC 109 (H) 06/17/2019   TRIG 71 06/17/2019   CHOLHDL 2.7 06/17/2019    Lab Results  Component Value Date   HGBA1C 5.8 (H) 06/17/2019    Assessment & Plan     1.  HFrEF/Severe MVR: -2D echo completed with reduced EF of 40-45%, mildly decreased LV function with global hypokinesis with severely elevated pulmonary artery systolic pressure and severely dilated LA and mildly dilated RA -severe LV and RV dysfunction by cardiac MRI though EF looked better by TEE (30-35% range with mild RV dysfunction).  -Today patient is euvolemic on examination with no complaints of shortness of breath currently. -She was seen recently in the advanced heart failure clinic with titration and adjustments to current GDMT. -Continue torsemide 10 mg daily, Farxiga 10 mg daily, losartan 12.5 mg daily, Aldactone 12.5 mg -Low sodium diet, fluid restriction <2L, and daily weights encouraged. Educated to contact our office for weight gain of 2 lbs overnight or 5 lbs in one week.    2.  Dyspnea on exertion -Today patient states that dyspnea on exertion has improved but still occurs frequently with minimal exertion.   3.  History of TIA: -Right-sided TIA in 2020 currently on Plavix with no recurrence. -Continue Plavix and ASA 81 mg   4.  Nonobstructive coronary artery disease: -s/p coronary calcium scoring with calcium score of 507 with aortic atherosclerosis noted and PET/CT with coronary calcifications noted. -Continue GDMT with Plavix, atorvastatin and ASA 81 mg -Patient had R/LHC completed with elevated right and left filling pressures with pulmonary venous hypertension and NICM     Disposition: Follow-up with Quay Burow, MD or APP in 4 months    Medication Adjustments/Labs and Tests Ordered: Current medicines are reviewed at length with the patient today.  Concerns regarding medicines are outlined above.   Signed, Mable Fill, Marissa Nestle, NP 04/04/2022, 1:08 PM [facility]  Note:  This document was prepared using Dragon voice recognition software and may include unintentional dictation errors.

## 2022-04-05 ENCOUNTER — Ambulatory Visit: Payer: Medicare HMO | Attending: Nurse Practitioner | Admitting: Nurse Practitioner

## 2022-04-05 ENCOUNTER — Encounter: Payer: Self-pay | Admitting: Nurse Practitioner

## 2022-04-05 VITALS — BP 100/58 | HR 94 | Ht 66.0 in | Wt 136.0 lb

## 2022-04-05 DIAGNOSIS — I5020 Unspecified systolic (congestive) heart failure: Secondary | ICD-10-CM

## 2022-04-05 DIAGNOSIS — I340 Nonrheumatic mitral (valve) insufficiency: Secondary | ICD-10-CM | POA: Diagnosis not present

## 2022-04-05 DIAGNOSIS — R0609 Other forms of dyspnea: Secondary | ICD-10-CM | POA: Diagnosis not present

## 2022-04-05 DIAGNOSIS — G459 Transient cerebral ischemic attack, unspecified: Secondary | ICD-10-CM

## 2022-04-05 NOTE — Patient Instructions (Signed)
Medication Instructions:  Your physician recommends that you continue on your current medications as directed. Please refer to the Current Medication list given to you today. *If you need a refill on your cardiac medications before your next appointment, please call your pharmacy*   Lab Work: None ordered   Testing/Procedures: None ordered   Follow-Up: At [facility], you and your health needs are our priority.  As part of our continuing mission to provide you with exceptional heart care, we have created designated Provider Care Teams.  These Care Teams include your primary Cardiologist (physician) and Advanced Practice Providers (APPs -  Physician Assistants and Nurse Practitioners) who all work together to provide you with the care you need, when you need it.  We recommend signing up for the patient portal called "MyChart".  Sign up information is provided on this After Visit Summary.  MyChart is used to connect with patients for Virtual Visits (Telemedicine).  Patients are able to view lab/test results, encounter notes, upcoming appointments, etc.  Non-urgent messages can be sent to your provider as well.   To learn more about what you can do with MyChart, go to [email or web address].    Your next appointment:   4 month(s)  Provider:   Ambrose Pancoast, NP       Wants to switch to Dr Johney Frame from Dr Gwenlyn Found  Other Instructions

## 2022-04-12 ENCOUNTER — Other Ambulatory Visit: Payer: Self-pay

## 2022-04-12 ENCOUNTER — Other Ambulatory Visit: Payer: Medicare HMO

## 2022-04-12 ENCOUNTER — Other Ambulatory Visit: Payer: Self-pay | Admitting: *Deleted

## 2022-04-12 DIAGNOSIS — R1310 Dysphagia, unspecified: Secondary | ICD-10-CM

## 2022-04-12 DIAGNOSIS — Z01812 Encounter for preprocedural laboratory examination: Secondary | ICD-10-CM

## 2022-04-12 NOTE — Progress Notes (Signed)
[place]

## 2022-04-14 LAB — NOVEL CORONAVIRUS, NAA: SARS-CoV-2, NAA: NOT DETECTED

## 2022-04-15 ENCOUNTER — Encounter (HOSPITAL_COMMUNITY): Payer: Self-pay | Admitting: Pulmonary Disease

## 2022-04-15 ENCOUNTER — Ambulatory Visit
Admission: RE | Admit: 2022-04-15 | Discharge: 2022-04-15 | Disposition: A | Discharge status: Home / Self Care | Payer: Medicare HMO | Source: Ambulatory Visit | Attending: Family | Admitting: Family

## 2022-04-15 ENCOUNTER — Ambulatory Visit (HOSPITAL_COMMUNITY)
Admission: RE | Admit: 2022-04-15 | Discharge: 2022-04-15 | Disposition: A | Discharge status: Home / Self Care | Payer: Medicare HMO | Source: Ambulatory Visit | Attending: Family Medicine | Admitting: Family Medicine

## 2022-04-15 ENCOUNTER — Other Ambulatory Visit: Payer: Self-pay

## 2022-04-15 ENCOUNTER — Ambulatory Visit: Payer: Medicare HMO

## 2022-04-15 ENCOUNTER — Encounter (HOSPITAL_COMMUNITY): Payer: Self-pay

## 2022-04-15 VITALS — BP 118/76 | HR 92 | Wt 138.0 lb

## 2022-04-15 DIAGNOSIS — I428 Other cardiomyopathies: Secondary | ICD-10-CM | POA: Insufficient documentation

## 2022-04-15 DIAGNOSIS — I5042 Chronic combined systolic (congestive) and diastolic (congestive) heart failure: Secondary | ICD-10-CM | POA: Diagnosis not present

## 2022-04-15 DIAGNOSIS — J4489 Other specified chronic obstructive pulmonary disease: Secondary | ICD-10-CM | POA: Diagnosis not present

## 2022-04-15 DIAGNOSIS — I5022 Chronic systolic (congestive) heart failure: Secondary | ICD-10-CM

## 2022-04-15 DIAGNOSIS — Z7902 Long term (current) use of antithrombotics/antiplatelets: Secondary | ICD-10-CM | POA: Insufficient documentation

## 2022-04-15 DIAGNOSIS — Z87891 Personal history of nicotine dependence: Secondary | ICD-10-CM | POA: Diagnosis not present

## 2022-04-15 DIAGNOSIS — Z1231 Encounter for screening mammogram for malignant neoplasm of breast: Secondary | ICD-10-CM

## 2022-04-15 DIAGNOSIS — E785 Hyperlipidemia, unspecified: Secondary | ICD-10-CM | POA: Diagnosis not present

## 2022-04-15 DIAGNOSIS — B3781 Candidal esophagitis: Secondary | ICD-10-CM | POA: Insufficient documentation

## 2022-04-15 DIAGNOSIS — N183 Chronic kidney disease, stage 3 unspecified: Secondary | ICD-10-CM | POA: Diagnosis not present

## 2022-04-15 DIAGNOSIS — R1310 Dysphagia, unspecified: Secondary | ICD-10-CM

## 2022-04-15 DIAGNOSIS — Z8673 Personal history of transient ischemic attack (TIA), and cerebral infarction without residual deficits: Secondary | ICD-10-CM | POA: Diagnosis not present

## 2022-04-15 DIAGNOSIS — I959 Hypotension, unspecified: Secondary | ICD-10-CM | POA: Diagnosis not present

## 2022-04-15 DIAGNOSIS — Z79899 Other long term (current) drug therapy: Secondary | ICD-10-CM | POA: Diagnosis not present

## 2022-04-15 DIAGNOSIS — I340 Nonrheumatic mitral (valve) insufficiency: Secondary | ICD-10-CM | POA: Diagnosis not present

## 2022-04-15 DIAGNOSIS — G459 Transient cerebral ischemic attack, unspecified: Secondary | ICD-10-CM

## 2022-04-15 DIAGNOSIS — K219 Gastro-esophageal reflux disease without esophagitis: Secondary | ICD-10-CM | POA: Insufficient documentation

## 2022-04-15 DIAGNOSIS — I471 Supraventricular tachycardia, unspecified: Secondary | ICD-10-CM | POA: Insufficient documentation

## 2022-04-15 DIAGNOSIS — D869 Sarcoidosis, unspecified: Secondary | ICD-10-CM | POA: Insufficient documentation

## 2022-04-15 DIAGNOSIS — I083 Combined rheumatic disorders of mitral, aortic and tricuspid valves: Secondary | ICD-10-CM | POA: Insufficient documentation

## 2022-04-15 DIAGNOSIS — I2722 Pulmonary hypertension due to left heart disease: Secondary | ICD-10-CM | POA: Insufficient documentation

## 2022-04-15 DIAGNOSIS — I130 Hypertensive heart and chronic kidney disease with heart failure and stage 1 through stage 4 chronic kidney disease, or unspecified chronic kidney disease: Secondary | ICD-10-CM | POA: Insufficient documentation

## 2022-04-15 DIAGNOSIS — I272 Pulmonary hypertension, unspecified: Secondary | ICD-10-CM | POA: Diagnosis not present

## 2022-04-15 LAB — BASIC METABOLIC PANEL
Anion gap: 11 (ref 5–15)
BUN: 31 mg/dL — ABNORMAL HIGH (ref 8–23)
CO2: 26 mmol/L (ref 22–32)
Calcium: 9.3 mg/dL (ref 8.9–10.3)
Chloride: 100 mmol/L (ref 98–111)
Creatinine, Ser: 1.35 mg/dL — ABNORMAL HIGH (ref 0.44–1.00)
GFR, Estimated: 42 mL/min — ABNORMAL LOW (ref >60)
Glucose, Bld: 115 mg/dL — ABNORMAL HIGH (ref 70–99)
Potassium: 3.6 mmol/L (ref 3.5–5.1)
Sodium: 137 mmol/L (ref 135–145)

## 2022-04-15 NOTE — Progress Notes (Signed)
ADVANCED HF CLINIC NOTE   Primary Care: Demetrios Isaacs, FNP HF Cardiologist: Dr. Aundra Dubin  HPI: 72 y.o. female w/ h/o chronic diastolic heart failure, HTN, HLD, h/o TIA, GERD and asthma. Former smoker (quit 72 y/o). Noted to have high coronary calcium score of 507 in March 2022. Referred to lipid clinic for aggressive tx of HLD but did not undergo cath in absence of symptoms. Echo 04/2018 showed normal LVEF and RV. No significant valvular dysfunction.    Recently referred back to cardiology for surgical clearance prior to undergoing planned EGD and EBUS (ordered given progressive dysphagia, wt loss and abnormal chest CT). She endorsed exertional dyspnea w/ inability to complete 4METs of activity w/o symptoms. Echo was done and showed drop in EF down to 40-45% w/ diffuse HK, severe LAE mod-severe MR, severely elevated RVSP, RV normal, mod-severe RAE and mod-severe TR. Subsequently referred for [facility].    [facility] 1/24 showed no significant CAD, only 10% pRCA narrowing, elevated R+ L heart filling pressures and marginal output, (PA saturation 48%, mean RA pressure 18 mmHg, PA pressure 50/28, mean PA pressure 37 mmHg, pulmonary capillary wedge pressure 35/36, mean pulmonary capillary wedge pressure 30 mmHg, cardiac output 3.92 L/min, cardiac index 2.27).    She was direct admitted from the cath lab for IV diuresis. TEE showed EF 30-35%, mild RV dysfunction, severe MR, moderate TR, moderate AI.  Cardiac MRI showed LV EF 18%, RV EF 25%, ?severe AI, moderate-severe MR (regurgitant fraction not likely accurate with low SV), non-coronary mid-wall LGE in the basal septal wall.  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  She had dysphagia and underwent EGD/colonoscopy showing esophageal candidiasis, unremarkable colonoscopy. Hospitalization also complicated by episode of SVT requiring adenosine. Her GDMT was titrated, but limited by soft BP. Structural Heart  team to review TEE to decide on mTEER candidacy. She was discharged home, weight 134 lbs.  Today she returns for HF follow up. Overall feeling fine. She is not short of breath walking up steps or pushing grocery cart. Denies palpitations, abnormal bleeding,  CP, dizziness, edema, or PND/Orthopnea. Appetite ok. No fever or chills. Weight at home 134 pounds. Taking all medications.  Retired from [employer]. Lives at home with her son. No tobacco use, 2-3 ETOH drinks on the weekends, no drugs. She snores occasionally, had a sleep study a couple years ago.  ECG (personally reviewed): none ordered today.  Labs (1/24): K 4.4, creatinine 1.42, AST 85, ALT 73 Labs (1/24): K 3.6, creatinine 2.03  PMH: 1. 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  2. COPD: Prior smoker.  3. Mediastinal lymphadenopathy: PET-CT with hypermetabolic lymphadenopathy, ?lymphoproliferative disorder vs primary bronchogenic carcinoma vs sarcoidosis.  4. CKD stage 3 5. SVT: Occurred in 1/24, required adenosine.  6. Chronic systolic CHF:  Nonischemic cardiomyopathy.  - Echo (3/20): EF 50-55%, RV normal, mild MR - Echo (1/24): EF 40-45%, Severe LAE, mod-severe MR, severely elevated RVSP, RV normal, mod-severe RAE, mod-severe TR  - TEE (1/24): EF 30-35%, severe MR, moderate TR, moderate AI - Cardiac MRI (1/24): LVEF 18%, RVEF 25%, moderate AI visually but regurgitant fraction 49% suggests severe AI, moderate-severe MR,  non-coronary mid-wall LGE in the basal septal wall, cannot rule out cardiac sarcoidosis (unexplained mediastinal LAN) - R/LHC (1/24): Mild nonobstructive CAD; RA mean 18, PA 50/28 (mean 37), PCWP 30, CO/CI: 3.92/ 2.27 7. Valvular heart disease:  TEE in 1/24 showed severe MR.  The valve is abnormal with thickening of the  leaflet tips and poor coapatation, ?underlying rheumatic or inflammatory etiology though not classic appearance for rheumatic heart disease and no mitral stenosis. Difficult to quantify mitral  regurgitation by MRI due to low stroke volume (do not think regurgitant fraction is accurate). The aortic valve is also abnormal with thickening and doming.  Aortic insufficiency was moderate by TEE though regurgitant volume by cMRI suggested severe AI (I still suspect truly in moderate range).  8. GERD  Review of Systems: All systems reviewed and negative except as per HPI.    Current Outpatient Medications  Medication Sig Dispense Refill   albuterol (PROVENTIL HFA;VENTOLIN HFA) 108 (90 BASE) MCG/ACT inhaler Inhale 2 puffs into the lungs every 2 (two) hours as needed for wheezing or shortness of breath (cough). 1 Inhaler 0   albuterol (PROVENTIL) (2.5 MG/3ML) 0.083% nebulizer solution Take 3 mLs (2.5 mg total) by nebulization every 6 (six) hours as needed for wheezing or shortness of breath. 75 mL 12   atorvastatin (LIPITOR) 80 MG tablet Take 1 tablet (80 mg total) by mouth daily at 6 PM. 30 tablet 2   busPIRone (BUSPAR) 10 MG tablet Take 10 mg by mouth 2 (two) times daily.     Cholecalciferol (VITAMIN D) 50 MCG (2000 UT) tablet Take 2,000 Units by mouth daily.     clopidogrel (PLAVIX) 75 MG tablet Take 1 tablet (75 mg total) by mouth daily. 30 tablet 0   dapagliflozin propanediol (FARXIGA) 10 MG TABS tablet Take 1 tablet (10 mg total) by mouth daily. 30 tablet 5   desloratadine (CLARINEX) 5 MG tablet Take 5 mg by mouth daily.     ezetimibe (ZETIA) 10 MG tablet Take 10 mg by mouth daily.     fluconazole (DIFLUCAN) 200 MG tablet Take 200 mg by mouth daily.     FLUoxetine (PROZAC) 20 MG capsule Take 20 mg by mouth daily.     fluticasone (FLONASE) 50 MCG/ACT nasal spray Place 1 spray into both nostrils daily.     fluticasone-salmeterol (WIXELA INHUB) 500-50 MCG/ACT AEPB Inhale 1 puff into the lungs in the morning and at bedtime. 60 each 6   losartan (COZAAR) 25 MG tablet Take 1/2 tablet (12.5 mg total) by mouth daily. 30 tablet 3   metoprolol succinate (TOPROL XL) 25 MG 24 hr tablet Take 0.5  tablets (12.5 mg total) by mouth at bedtime. 15 tablet 8   montelukast (SINGULAIR) 10 MG tablet Take 1 tablet (10 mg total) by mouth at bedtime. 30 tablet 11   pantoprazole (PROTONIX) 40 MG tablet TAKE 1 TABLET BY MOUTH EVERY DAY 90 tablet 1   potassium chloride SA (KLOR-CON M) 20 MEQ tablet Take 1 tablet (20 mEq total) by mouth every other day on the same days that you take torsemide. 15 tablet 11   spironolactone (ALDACTONE) 25 MG tablet Take 1/2 tablet (12.5 mg total) by mouth daily. 30 tablet 3   torsemide (DEMADEX) 20 MG tablet Take 0.5 tablets (10 mg total) by mouth every other day. 30 tablet 3   No current facility-administered medications for this encounter.   Allergies  Allergen Reactions   Amoxicillin-Pot Clavulanate Itching   Other Other (See Comments)    08/06/2018 -    06/05/2016 -    09/15/2014 -    06/16/2014 -   Social History   Socioeconomic History   Marital status: Widowed    Spouse name: Not on file   Number of children: Not on file   Years of education: Not on file  Highest education level: Not on file  Occupational History   Not on file  Tobacco Use   Smoking status: Former    Packs/day: 0.50    Years: 30.00    Total pack years: 15.00    Types: Cigarettes    Quit date: 11/10/1998    Years since quitting: 23.4   Smokeless tobacco: Never  Vaping Use   Vaping Use: Never used  Substance and Sexual Activity   Alcohol use: Yes    Comment: occasional   Drug use: No   Sexual activity: Not Currently  Other Topics Concern   Not on file  Social History Narrative   Not on file   Social Determinants of Health   Financial Resource Strain: Not on file  Food Insecurity: No Food Insecurity (02/28/2022)   Hunger Vital Sign    Worried About Running Out of Food in the Last Year: Never true    Ran Out of Food in the Last Year: Never true  Transportation Needs: No Transportation Needs (02/28/2022)   PRAPARE - Hydrologist (Medical): No     Lack of Transportation (Non-Medical): No  Physical Activity: Not on file  Stress: Not on file  Social Connections: Not on file  Intimate Partner Violence: Not At Risk (02/28/2022)   Humiliation, Afraid, Rape, and Kick questionnaire    Fear of Current or Ex-Partner: No    Emotionally Abused: No    Physically Abused: No    Sexually Abused: No   Family History  Problem Relation Age of Onset   Hypertension Mother    Hypertension Father    Colon cancer Father    Hypertension Sister    BP 118/76   Pulse 92   Wt 62.6 kg (138 lb)   SpO2 95%   BMI 22.27 kg/m   Wt Readings from Last 3 Encounters:  04/15/22 62.6 kg (138 lb)  04/05/22 61.7 kg (136 lb)  03/25/22 59.9 kg (132 lb)   PHYSICAL EXAM: General:  NAD. No resp difficulty, walked into clinic HEENT: Normal Neck: Supple. No JVD. Carotids 2+ bilat; no bruits. No lymphadenopathy or thryomegaly appreciated. Cor: PMI nondisplaced. Regular rate & rhythm. No rubs, gallops, 2/6 HSM apex Lungs: Clear Abdomen: Soft, nontender, nondistended. No hepatosplenomegaly. No bruits or masses. Good bowel sounds. Extremities: No cyanosis, clubbing, rash, edema Neuro: Alert & oriented x 3, cranial nerves grossly intact. Moves all 4 extremities w/o difficulty. Affect pleasant.  ASSESSMENT & PLAN: 1. Chronic systolic CHF: Echo in 0000000 showed low normal EF 50-55%, no significant MR.  Echo in 1/24 showed EF 40-45%, mild RV dilation, moderate pulmonary HTN, mod-severe MR and moderate-severe TR.  RHC/LHC (1/24) with elevated left and right heart filling pressures, pulmonary venous hypertension, preserved cardiac output.  Nonischemic cardiomyopathy.  Cause is uncertain, ?valvular disease. TEE done 03/01/22 showed EF 30-35%, mild RV dysfunction, severe MR, moderate TR, moderate AI.  Cardiac MRI 1/12 showed LV EF 18%, RV EF 25%, ? severe AI, moderate-severe MR (regurgitant fraction not likely accurate with low SV), non-coronary mid-wall LGE in the basal septal  wall.  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  Today, she is NYHA I-II, she is not volume overloaded on exam.  - Continue Toprol XL 12.5 mg qhs.  - Continue torsemide 10 mg with every other day. BMET today - Continue losartan 12.5 mg daily (Entresto stopped with low BP). - Continue Farxiga 10 mg daily.  - Continue spironolactone 12.5 daily.   -  Ongoing workup of mediastinal LAN with Dr. Valeta Harms, ? Sarcoidosis, planning EBUS tomorrow with Dr. Valeta Harms.  2. Valvular heart disease: TEE showed severe MR.  The valve is abnormal with thickening of the leaflet tips and poor coapatation, ?underlying rheumatic or inflammatory etiology though not classic appearance for rheumatic heart disease and no mitral stenosis. Difficult to quantify mitral regurgitation by MRI due to low stroke volume (do not think regurgitant fraction is accurate). The aortic valve is also abnormal with thickening and doming.  Aortic insufficiency was moderate by TEE though regurgitant volume by cMRI suggested severe AI (I still suspect truly in moderate range).  - The structural heart team reviewed TEE images and felt she is not suitable for mTEER. We have referred her to Dr. Lavonna Monarch for evaluation for surgical mitral valve replacement, as well as AVR. Will call to follow up on this today. 3. Pulmonary hypertension: Group 2 pulmonary venous hypertension.  4. Dysphagia: EGD showed esophageal candidiasis, no definite stricture. Had esophageal dilatation.  - Continue fluconazole for candidiasis.  5. H/o TIA/CVA: She has been on Plavix long-term.   - Continue Plavix, will stop before EBUS. 6. Pulmonary: Prior smoker with COPD. PET-CT showed hypermetabolic mediastinal LAN, either lymphoproliferative disorder or primary bronchogenic CA considered.  I also wonder if this could represent sarcoidosis.  - Has EBUS tomorrow with Dr. Valeta Harms.  7. H/o AKI: Labs today. 8. SVT: Episode of SVT 1/12,  required adenosine to break. No prior history.  Asymptomatic - Continue Toprol.   Follow up in 3 months with Dr. Aundra Dubin. Will see if we can expedite CVTS referral.  Allena Katz, FNP-BC 04/15/22

## 2022-04-15 NOTE — Progress Notes (Signed)
Spoke with pt for pre-op call. Pt has hx of CAD, chronic diastolic heart failure, TIA. Pt states she is not diabetic. Pt is on Plavix and states last dose was 04/08/22. Pt was seen at Dr. Claris Gladden office today. Clearance given.   She states no one instructed her to stop [place] prior to procedure.   Shower instructions given to pt and she voiced understanding.

## 2022-04-15 NOTE — Patient Instructions (Addendum)
Thank you for coming in today  Labs were done today, if any labs are abnormal the clinic will call you No news is good news  Cardiothoracic office was called today they will be calling you to make a appointment [phone]  Your physician recommends that you schedule a follow-up appointment in:  3 months with Dr. Kendall Flack will receive a reminder letter in the mail a few months in advance. If you don't receive a letter, please call our office to schedule the follow-up appointment.    Do the following things EVERYDAY: Weigh yourself in the morning before breakfast. Write it down and keep it in a log. Take your medicines as prescribed Eat low salt foods--Limit salt (sodium) to 2000 mg per day.  Stay as active as you can everyday Limit all fluids for the day to less than 2 liters  At the [facility], you and your health needs are our priority. As part of our continuing mission to provide you with exceptional heart care, we have created designated Provider Care Teams. These Care Teams include your primary Cardiologist (physician) and Advanced Practice Providers (APPs- Physician Assistants and Nurse Practitioners) who all work together to provide you with the care you need, when you need it.   You may see any of the following providers on your designated Care Team at your next follow up: Dr Glori Bickers Dr Loralie Champagne Dr. Roxana Hires, NP Lyda Jester, [place] [facility] [place], [place] Forestine Na, NP Audry Riles, PharmD   Please be sure to bring in all your medications bottles to every appointment.    Thank you for choosing [facility]   If you have any questions or concerns before your next appointment please send [place] a message through [place] or call our office at [phone].    TO LEAVE A MESSAGE FOR THE NURSE SELECT OPTION 2, PLEASE LEAVE A MESSAGE INCLUDING: YOUR NAME DATE OF  BIRTH CALL BACK NUMBER REASON FOR CALL**this is important as we prioritize the call backs  YOU WILL RECEIVE A CALL BACK THE SAME DAY AS LONG AS YOU CALL BEFORE 4:00 PM

## 2022-04-16 ENCOUNTER — Ambulatory Visit (HOSPITAL_COMMUNITY): Payer: Medicare HMO | Admitting: Certified Registered Nurse Anesthetist

## 2022-04-16 ENCOUNTER — Encounter (HOSPITAL_COMMUNITY): Payer: Self-pay | Admitting: Pulmonary Disease

## 2022-04-16 ENCOUNTER — Other Ambulatory Visit: Payer: Self-pay

## 2022-04-16 ENCOUNTER — Other Ambulatory Visit: Payer: Self-pay | Admitting: *Deleted

## 2022-04-16 ENCOUNTER — Ambulatory Visit (HOSPITAL_BASED_OUTPATIENT_CLINIC_OR_DEPARTMENT_OTHER): Payer: Medicare HMO | Admitting: Certified Registered Nurse Anesthetist

## 2022-04-16 ENCOUNTER — Encounter (HOSPITAL_COMMUNITY)
Admission: RE | Disposition: A | Discharge status: Home / Self Care | Payer: Self-pay | Source: Ambulatory Visit | Attending: Pulmonary Disease

## 2022-04-16 ENCOUNTER — Ambulatory Visit (HOSPITAL_COMMUNITY)
Admission: RE | Admit: 2022-04-16 | Discharge: 2022-04-16 | Disposition: A | Discharge status: Home / Self Care | Payer: Medicare HMO | Source: Ambulatory Visit | Attending: Pulmonary Disease | Admitting: Pulmonary Disease

## 2022-04-16 DIAGNOSIS — I5022 Chronic systolic (congestive) heart failure: Secondary | ICD-10-CM | POA: Diagnosis not present

## 2022-04-16 DIAGNOSIS — Z87891 Personal history of nicotine dependence: Secondary | ICD-10-CM | POA: Diagnosis not present

## 2022-04-16 DIAGNOSIS — J449 Chronic obstructive pulmonary disease, unspecified: Secondary | ICD-10-CM | POA: Diagnosis not present

## 2022-04-16 DIAGNOSIS — K219 Gastro-esophageal reflux disease without esophagitis: Secondary | ICD-10-CM | POA: Diagnosis not present

## 2022-04-16 DIAGNOSIS — R599 Enlarged lymph nodes, unspecified: Secondary | ICD-10-CM

## 2022-04-16 DIAGNOSIS — R590 Localized enlarged lymph nodes: Secondary | ICD-10-CM | POA: Insufficient documentation

## 2022-04-16 DIAGNOSIS — F419 Anxiety disorder, unspecified: Secondary | ICD-10-CM

## 2022-04-16 DIAGNOSIS — Z7951 Long term (current) use of inhaled steroids: Secondary | ICD-10-CM | POA: Diagnosis not present

## 2022-04-16 DIAGNOSIS — Z8673 Personal history of transient ischemic attack (TIA), and cerebral infarction without residual deficits: Secondary | ICD-10-CM | POA: Diagnosis not present

## 2022-04-16 DIAGNOSIS — I2510 Atherosclerotic heart disease of native coronary artery without angina pectoris: Secondary | ICD-10-CM | POA: Diagnosis not present

## 2022-04-16 DIAGNOSIS — Z7722 Contact with and (suspected) exposure to environmental tobacco smoke (acute) (chronic): Secondary | ICD-10-CM | POA: Diagnosis not present

## 2022-04-16 DIAGNOSIS — I739 Peripheral vascular disease, unspecified: Secondary | ICD-10-CM | POA: Diagnosis not present

## 2022-04-16 DIAGNOSIS — I340 Nonrheumatic mitral (valve) insufficiency: Secondary | ICD-10-CM | POA: Diagnosis not present

## 2022-04-16 DIAGNOSIS — J439 Emphysema, unspecified: Secondary | ICD-10-CM | POA: Diagnosis not present

## 2022-04-16 DIAGNOSIS — I110 Hypertensive heart disease with heart failure: Secondary | ICD-10-CM | POA: Insufficient documentation

## 2022-04-16 HISTORY — DX: Heart failure, unspecified: I50.9

## 2022-04-16 HISTORY — PX: BRONCHIAL NEEDLE ASPIRATION BIOPSY: SHX5106

## 2022-04-16 HISTORY — PX: VIDEO BRONCHOSCOPY WITH ENDOBRONCHIAL ULTRASOUND: SHX6177

## 2022-04-16 LAB — CBC
HCT: 37.7 % (ref 36.0–46.0)
Hemoglobin: 12.5 g/dL (ref 12.0–15.0)
MCH: 29.4 pg (ref 26.0–34.0)
MCHC: 33.2 g/dL (ref 30.0–36.0)
MCV: 88.7 fL (ref 80.0–100.0)
Platelets: 238 10*3/uL (ref 150–400)
RBC: 4.25 MIL/uL (ref 3.87–5.11)
RDW: 14.5 % (ref 11.5–15.5)
WBC: 4.2 10*3/uL (ref 4.0–10.5)
nRBC: 0 % (ref 0.0–0.2)

## 2022-04-16 LAB — NO BLOOD PRODUCTS

## 2022-04-16 SURGERY — BRONCHOSCOPY, WITH EBUS
Anesthesia: General | Laterality: Bilateral

## 2022-04-16 MED ORDER — SUGAMMADEX SODIUM 200 MG/2ML IV SOLN
INTRAVENOUS | Status: DC | PRN
  Administered 2022-04-16: 200 mg via INTRAVENOUS

## 2022-04-16 MED ORDER — LIDOCAINE 2% (20 MG/ML) 5 ML SYRINGE
INTRAMUSCULAR | Status: DC | PRN
  Administered 2022-04-16: 60 mg via INTRAVENOUS

## 2022-04-16 MED ORDER — DEXAMETHASONE SODIUM PHOSPHATE 10 MG/ML IJ SOLN
INTRAMUSCULAR | Status: DC | PRN
  Administered 2022-04-16: 10 mg via INTRAVENOUS

## 2022-04-16 MED ORDER — ROCURONIUM BROMIDE 10 MG/ML (PF) SYRINGE
PREFILLED_SYRINGE | INTRAVENOUS | Status: DC | PRN
  Administered 2022-04-16: 50 mg via INTRAVENOUS

## 2022-04-16 MED ORDER — CHLORHEXIDINE GLUCONATE 0.12 % MT SOLN: 15.0000 mL | Freq: Once | OROMUCOSAL | Status: AC

## 2022-04-16 MED ORDER — LACTATED RINGERS IV SOLN: INTRAVENOUS | Status: DC

## 2022-04-16 MED ORDER — ONDANSETRON HCL 4 MG/2ML IJ SOLN
INTRAMUSCULAR | Status: DC | PRN
  Administered 2022-04-16: 4 mg via INTRAVENOUS

## 2022-04-16 MED ORDER — PROPOFOL 10 MG/ML IV BOLUS
INTRAVENOUS | Status: DC | PRN
  Administered 2022-04-16: 90 mg via INTRAVENOUS

## 2022-04-16 MED ORDER — FENTANYL CITRATE (PF) 100 MCG/2ML IJ SOLN
INTRAMUSCULAR | Status: AC
  Filled 2022-04-16: qty 2

## 2022-04-16 MED ORDER — PANTOPRAZOLE SODIUM 40 MG PO TBEC: 40.0000 mg | DELAYED_RELEASE_TABLET | Freq: Every day | ORAL | 1 refills | Status: DC

## 2022-04-16 MED ORDER — PROPOFOL 500 MG/50ML IV EMUL
INTRAVENOUS | Status: DC | PRN
  Administered 2022-04-16: 100 ug/kg/min via INTRAVENOUS

## 2022-04-16 MED ORDER — CHLORHEXIDINE GLUCONATE 0.12 % MT SOLN
OROMUCOSAL | Status: AC
  Administered 2022-04-16: 15 mL via OROMUCOSAL
  Filled 2022-04-16: qty 15

## 2022-04-16 MED ORDER — FENTANYL CITRATE (PF) 100 MCG/2ML IJ SOLN
INTRAMUSCULAR | Status: DC | PRN
  Administered 2022-04-16: 50 ug via INTRAVENOUS

## 2022-04-16 SURGICAL SUPPLY — 29 items

## 2022-04-16 NOTE — Op Note (Signed)
Video Bronchoscopy with Endobronchial Ultrasound Procedure Note  Date of Operation: 04/16/2022  Pre-op Diagnosis: Mediastinal adenopathy  Post-op Diagnosis: Mediastinal adenopathy  Surgeon: Garner Nash DO  Assistants: none  Anesthesia: General endotracheal anesthesia  Operation: Flexible video fiberoptic bronchoscopy with endobronchial ultrasound and biopsies.  Estimated Blood Loss: Minimal  Complications: None   Indications and History: Shannon [name] is a 72 y.o. female with mediastinal adenopathy.  The risks, benefits, complications, treatment options and expected outcomes were discussed with the patient.  The possibilities of pneumothorax, pneumonia, reaction to medication, pulmonary aspiration, perforation of a viscus, bleeding, failure to diagnose a condition and creating a complication requiring transfusion or operation were discussed with the patient who freely signed the consent.    Description of Procedure: The patient was examined in the preoperative area and history and data from the preprocedure consultation were reviewed. It was deemed appropriate to proceed.  The patient was taken to [facility] endoscopy room 3, identified as Shannon [name] and the procedure verified as Flexible Video Fiberoptic Bronchoscopy.  A Time Out was held and the above information confirmed. After being taken to the operating room general anesthesia was initiated and the patient  was orally intubated. The video fiberoptic bronchoscope was introduced via the endotracheal tube and a general inspection was performed which showed normal right and left lung anatomy, no evidence of endobronchial lesion. The standard scope was then withdrawn and the endobronchial ultrasound was used to identify and characterize the peritracheal, hilar and bronchial lymph nodes. Inspection showed enlarged station 4R, normal lymph nodes within the bilateral hilar and subcarinal space.. Using real-time ultrasound  guidance Wang needle biopsies were take from Station 4R nodes and were sent for cytology. The patient tolerated the procedure well without apparent complications. There was no significant blood loss. The bronchoscope was withdrawn. Anesthesia was reversed and the patient was taken to the PACU for recovery.   Samples: 1. Wang needle biopsies from 4R node  Plans:  The patient will be discharged from the PACU to home when recovered from anesthesia. We will review the cytology, pathology results with the patient when they become available. Outpatient followup will be with Dr. Erin Fulling.   [facility] 04/16/2022 4:15 PM

## 2022-04-16 NOTE — Interval H&P Note (Signed)
History and Physical Interval Note:  04/16/2022 2:54 PM  Shannon [name]  has presented today for surgery, with the diagnosis of adenoapthy.  The various methods of treatment have been discussed with the patient and family. After consideration of risks, benefits and other options for treatment, the patient has consented to  Procedure(s): [place] (Bilateral) as a surgical intervention.  The patient's history has been reviewed, patient examined, no change in status, stable for surgery.  I have reviewed the patient's chart and labs.  Questions were answered to the patient's satisfaction.     [place]

## 2022-04-16 NOTE — Anesthesia Preprocedure Evaluation (Signed)
Anesthesia Evaluation  Patient identified by MRN, date of birth, ID band Patient awake    Reviewed: Allergy & Precautions, H&P , NPO status , Patient's Chart, lab work & pertinent test results, reviewed documented beta blocker date and time   Airway Mallampati: II  TM Distance: >3 FB Neck ROM: Full    Dental no notable dental hx. (+) Teeth Intact, Dental Advisory Given, Poor Dentition   Pulmonary asthma , COPD,  COPD inhaler, former smoker   Pulmonary exam normal breath sounds clear to auscultation       Cardiovascular hypertension, Pt. on medications and Pt. on home beta blockers + Peripheral Vascular Disease and +CHF  + dysrhythmias  Rhythm:Regular Rate:Normal     Neuro/Psych   Anxiety     TIACVA, No Residual Symptoms    GI/Hepatic Neg liver ROS,GERD  Medicated,,  Endo/Other  negative endocrine ROS    Renal/GU Renal InsufficiencyRenal disease  negative genitourinary   Musculoskeletal  (+) Arthritis , Osteoarthritis,    Abdominal Normal abdominal exam  (+)   Peds  Hematology negative hematology ROS (+)   Anesthesia Other Findings  IMPRESSIONS     1. Left ventricular ejection fraction, by estimation, is 30 to 35%. The  left ventricle has moderately decreased function. The left ventricle  demonstrates global hypokinesis. The left ventricular internal cavity size  was mildly dilated. There is mild  concentric left ventricular hypertrophy.   2. Right ventricular systolic function is mildly reduced. The right  ventricular size is mildly enlarged. There is moderately elevated  pulmonary artery systolic pressure. The estimated right ventricular  systolic pressure is [id number] mmHg.   3. Left atrial size was severely dilated. No left atrial/left atrial  appendage thrombus was detected.   4. Right atrial size was mildly dilated.   5. No ASD or PFO by color doppler.   6. The mitral valve is abnormal. Severe mitral valve  regurgitation. The  mitral valve is thickened at the leaflet tips with poor coaptation. There  is restriction of the posterior leaflet. 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 There are 2 main jets of mitral regurgitation. PISA  measured for one jet, ERO 0.32 cm^2. The total vena contracta area was  0.58 cm^2. There was systolic flow reversal in the pulmonary vein doppler  pattern. No evidence of mitral  stenosis.   7. The tricuspid valve is abnormal. Tricuspid valve regurgitation is  moderate.   8. The aortic valve tips appeared thickened and domed. The aortic valve  is tricuspid. Aortic valve regurgitation is moderate. No aortic stenosis  is present.   9. The inferior vena cava is normal in size with greater than 50%  respiratory variability, suggesting right atrial pressure of 3 mmHg.   FINDINGS   Left Ventricle: Left ventricular ejection fraction, by estimation, is 30  to 35%. The left ventricle has moderately decreased function. The left  ventricle demonstrates global hypokinesis. The left ventricular internal  cavity size was mildly dilated.  There   RCA lesion is 10% stenosed.   LV end diastolic pressure is moderately elevated.   There is no aortic valve stenosis.   No angiographically apparent left sided coronary artery disease.   Hemodynamic findings consistent with moderate pulmonary hypertension.   Aortic saturation 83%, PA saturation 48%, mean RA pressure 18 mmHg, PA pressure 50/28, mean PA pressure 37 mmHg, pulmonary capillary wedge pressure 35/36, mean pulmonary capillary wedge pressure 30 mmHg, cardiac output 3.92 L/min, cardiac index  2.27.     Reproductive/Obstetrics negative OB ROS                             Anesthesia Physical Anesthesia Plan  ASA: 3  Anesthesia Plan: General   Post-op Pain Management: Tylenol PO (pre-op)*   Induction: Intravenous  PONV Risk Score and Plan: 3 and  Ondansetron and Dexamethasone  Airway Management Planned: Oral ETT  Additional Equipment:   Intra-op Plan:   Post-operative Plan: Extubation in OR  Informed Consent: I have reviewed the patients History and Physical, chart, labs and discussed the procedure including the risks, benefits and alternatives for the proposed anesthesia with the patient or authorized representative who has indicated his/her understanding and acceptance.     Dental advisory given  Plan Discussed with: CRNA  Anesthesia Plan Comments:         Anesthesia Quick Evaluation

## 2022-04-16 NOTE — Anesthesia Postprocedure Evaluation (Signed)
Anesthesia Post Note  Patient: Shannon [name]  Procedure(s) Performed: VIDEO BRONCHOSCOPY WITH ENDOBRONCHIAL ULTRASOUND (Bilateral) BRONCHIAL NEEDLE ASPIRATION BIOPSIES     Patient location during evaluation: PACU Anesthesia Type: General Level of consciousness: awake and alert Pain management: pain level controlled Vital Signs Assessment: post-procedure vital signs reviewed and stable Respiratory status: spontaneous breathing, nonlabored ventilation and respiratory function stable Cardiovascular status: blood pressure returned to baseline Postop Assessment: no apparent nausea or vomiting Anesthetic complications: no   No notable events documented.  Last Vitals:  Vitals:   04/16/22 1610 04/16/22 1620  BP: (!) 115/51 (!) 123/56  Pulse: 67 65  Resp: 17 15  Temp:    SpO2: 93% 94%    Last Pain:  Vitals:   04/16/22 1620  TempSrc:   PainSc: 0-No pain                 Marthenia Rolling

## 2022-04-16 NOTE — Anesthesia Procedure Notes (Signed)
Procedure Name: Intubation Date/Time: 04/16/2022 3:31 PM  Performed by: Dorthea Cove, CRNAPre-anesthesia Checklist: Patient identified, Emergency Drugs available, Suction available and Patient being monitored Patient Re-evaluated:Patient Re-evaluated prior to induction Oxygen Delivery Method: Circle system utilized Preoxygenation: Pre-oxygenation with 100% oxygen Induction Type: IV induction Ventilation: Mask ventilation without difficulty Laryngoscope Size: Glidescope and 3 Grade View: Grade I Tube type: Oral Tube size: 8.5 mm Number of attempts: 1 Airway Equipment and Method: Stylet and Oral airway Placement Confirmation: ETT inserted through vocal cords under direct vision, positive ETCO2 and breath sounds checked- equal and bilateral Secured at: 21 cm Tube secured with: Tape Dental Injury: Teeth and Oropharynx as per pre-operative assessment

## 2022-04-16 NOTE — Transfer of Care (Signed)
Immediate Anesthesia Transfer of Care Note  Patient: Shannon [name]  Procedure(s) Performed: VIDEO BRONCHOSCOPY WITH ENDOBRONCHIAL ULTRASOUND (Bilateral) BRONCHIAL NEEDLE ASPIRATION BIOPSIES  Patient Location: Endoscopy Unit  Anesthesia Type:General  Level of Consciousness: awake, drowsy, and patient cooperative  Airway & Oxygen Therapy: Patient Spontanous Breathing  Post-op Assessment: Report given to RN and Post -op Vital signs reviewed and stable  Post vital signs: Reviewed and stable  Last Vitals: See PACU vitals Vitals Value Taken Time  BP    Temp    Pulse    Resp    SpO2      Last Pain:  Vitals:   04/16/22 1331  TempSrc:   PainSc: 0-No pain         Complications: No notable events documented.

## 2022-04-17 ENCOUNTER — Encounter (HOSPITAL_COMMUNITY): Payer: Self-pay | Admitting: Pulmonary Disease

## 2022-04-18 LAB — CYTOLOGY - NON PAP

## 2022-04-22 ENCOUNTER — Encounter: Payer: Self-pay | Admitting: *Deleted

## 2022-04-22 ENCOUNTER — Encounter: Payer: Self-pay | Admitting: Thoracic Surgery (Cardiothoracic Vascular Surgery)

## 2022-04-22 ENCOUNTER — Institutional Professional Consult (permissible substitution) (INDEPENDENT_AMBULATORY_CARE_PROVIDER_SITE_OTHER): Payer: Medicare HMO | Admitting: Thoracic Surgery (Cardiothoracic Vascular Surgery)

## 2022-04-22 ENCOUNTER — Other Ambulatory Visit: Payer: Self-pay | Admitting: *Deleted

## 2022-04-22 ENCOUNTER — Other Ambulatory Visit (HOSPITAL_COMMUNITY): Payer: Self-pay

## 2022-04-22 VITALS — BP 100/67 | HR 95 | Resp 20 | Ht 66.0 in | Wt 140.0 lb

## 2022-04-22 DIAGNOSIS — I351 Nonrheumatic aortic (valve) insufficiency: Secondary | ICD-10-CM

## 2022-04-22 DIAGNOSIS — I340 Nonrheumatic mitral (valve) insufficiency: Secondary | ICD-10-CM

## 2022-04-22 NOTE — Progress Notes (Signed)
I attempted to call the patient to review her recent pathology results.  Biopsy results are consistent with a benign lymph node.  No evidence of malignancy.  If patient returns call to the office please inform patient of results of biopsy.  She also has an appointment to discuss next steps with Dr. Erin Fulling on 05/07/2022.  Thanks,  BLI  Garner Nash, DO [facility] 04/22/2022 5:38 PM

## 2022-04-22 NOTE — Patient Instructions (Signed)
Stop plavix 7 days prior to surgery Preoperative studies and labs Surgery 3/19

## 2022-04-22 NOTE — Progress Notes (Signed)
[address]       [place] [address]             [phone]           Yakelin A Dalal Penfield Medical Record [id number] Date of Birth: 12-Aug-1950  Larey Dresser, MD Demetrios Isaacs, FNP  Chief Complaint: Mitral and aortic regurgitation    History of Present Illness:     Pt is a 72 yo female who had a normal echo about 2 years ago. 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 Pt had an EF of 35%. Pt was sent for cath and found to have no CAD but elevated filling pressures and was admitted and diuresed. TEE and MRI performed have documented ongoing Cardiolmyopathy with MRI having EF of 18% and concern for severe AI and ongoing MR. Pt was felt secondary to her mitral pathology to not have a clip option. She has recently had bronchoscopy of mediastinal adenopathy without cancer and no mention of sarcoidosis. She has had esophageal dilation with improvement in her ability to swallow. Pt has had TIA of Left arm paresis and numbness that lasted for a week and resolved and has been on plavix without recurrence. Pt here for surgical consideration. She is overall doing well symptomatically with occasional DOE. No longer with lower ext edema. She reports her weight varies       Past Medical History:  Diagnosis Date   Anxiety    Arthritis    "right knee" (05/26/2014)   Asthma    CHF (congestive heart failure) (HCC)    chronic diastolic heart failure - sees Dr. Aundra Dubin   Chronic lower back pain    Dysrhythmia    irregular heart beat   GERD (gastroesophageal reflux disease)    HLD (hyperlipidemia)    Hypertension    Pneumonia    Stroke [facility])    TIA - ?2021   TIA (transient ischemic attack) 04/2018    Past Surgical History:  Procedure Laterality Date   BRONCHIAL BIOPSY  07/10/2021   Procedure:  BRONCHIAL BIOPSIES;  Surgeon: Garner Nash, DO;  Location: [facility];  Service: Pulmonary;;   BRONCHIAL BRUSHINGS  07/10/2021   Procedure: BRONCHIAL BRUSHINGS;  Surgeon: Garner Nash, DO;  Location: [facility];  Service: Pulmonary;;   BRONCHIAL NEEDLE ASPIRATION BIOPSY  07/10/2021   Procedure: BRONCHIAL NEEDLE ASPIRATION BIOPSIES;  Surgeon: Garner Nash, DO;  Location: [facility];  Service: Pulmonary;;   BRONCHIAL NEEDLE ASPIRATION BIOPSY  04/16/2022   Procedure: BRONCHIAL NEEDLE ASPIRATION BIOPSIES;  Surgeon: Garner Nash, DO;  Location: [facility];  Service: Cardiopulmonary;;   BRONCHIAL WASHINGS  07/10/2021   Procedure: BRONCHIAL WASHINGS;  Surgeon: Garner Nash, DO;  Location: [facility];  Service: Pulmonary;;   COLONOSCOPY WITH PROPOFOL N/A 03/03/2022   Procedure: COLONOSCOPY WITH PROPOFOL;  Surgeon: Carol Ada, MD;  Location: [place];  Service: Gastroenterology;  Laterality: N/A;   ESOPHAGOGASTRODUODENOSCOPY N/A 05/27/2014   Procedure: ESOPHAGOGASTRODUODENOSCOPY (EGD);  Surgeon: Carol Ada, MD;  Location: [facility] END[facility];  Service: Endoscopy;  Laterality: N/A;   ESOPHAGOGASTRODUODENOSCOPY (EGD) WITH PROPOFOL N/A 03/03/2022   Procedure: ESOPHAGOGASTRODUODENOSCOPY (EGD) WITH PROPOFOL;  Surgeon: Carol Ada, MD;  Location: [facility];  Service: Gastroenterology;  Laterality: N/A;   FRACTURE SURGERY     [place]  FRACTURE SURGERY Right ~ 2009   RIGHT/LEFT HEART CATH AND CORONARY ANGIOGRAPHY N/A 02/28/2022   Procedure: RIGHT/LEFT HEART CATH AND CORONARY ANGIOGRAPHY;  Surgeon: Jettie Booze, MD;  Location: [facility];  Service: Cardiovascular;  Laterality: N/A;   SAVORY DILATION N/A 03/03/2022   Procedure: SAVORY DILATION;  Surgeon: Carol Ada, MD;  Location: [place];  Service: Gastroenterology;  Laterality: N/A;   TEE WITHOUT CARDIOVERSION N/A 03/01/2022   Procedure: TRANSESOPHAGEAL ECHOCARDIOGRAM (TEE);  Surgeon: Larey Dresser, MD;   Location: [facility] ENDOSCOPY;  Service: Cardiovascular;  Laterality: N/A;   TONSILLECTOMY  ~ [place]  1980's   [place] Bilateral 04/16/2022   Procedure: VIDEO BRONCHOSCOPY WITH ENDOBRONCHIAL ULTRASOUND;  Surgeon: Garner Nash, DO;  Location: [facility];  Service: Cardiopulmonary;  Laterality: Bilateral;   VIDEO BRONCHOSCOPY WITH RADIAL ENDOBRONCHIAL ULTRASOUND  07/10/2021   Procedure: RADIAL ENDOBRONCHIAL ULTRASOUND;  Surgeon: Garner Nash, DO;  Location: [facility];  Service: Pulmonary;;    Social History   Tobacco Use  Smoking Status Former   Packs/day: 0.50   Years: 30.00   Total pack years: 15.00   Types: Cigarettes   Quit date: 11/10/1998   Years since quitting: 23.4  Smokeless Tobacco Never    Social History   Substance and Sexual Activity  Alcohol Use Yes   Comment: occasional    Social History   Socioeconomic History   Marital status: Widowed    Spouse name: Not on file   Number of children: Not on file   Years of education: Not on file   Highest education level: Not on file  Occupational History   Not on file  Tobacco Use   Smoking status: Former    Packs/day: 0.50    Years: 30.00    Total pack years: 15.00    Types: Cigarettes    Quit date: 11/10/1998    Years since quitting: 23.4   Smokeless tobacco: Never  Vaping Use   Vaping Use: Never used  Substance and Sexual Activity   Alcohol use: Yes    Comment: occasional   Drug use: No   Sexual activity: Not Currently  Other Topics Concern   Not on file  Social History Narrative   Not on file   Social Determinants of Health   Financial Resource Strain: Not on file  Food Insecurity: No Food Insecurity (02/28/2022)   Hunger Vital Sign    Worried About Running Out of Food in the Last Year: Never true    Ran Out of Food in the Last Year: Never true  Transportation Needs: No Transportation Needs (02/28/2022)   PRAPARE - Armed forces logistics/support/administrative officer (Medical): No    Lack of Transportation (Non-Medical): No  Physical Activity: Not on file  Stress: Not on file  Social Connections: Not on file  Intimate Partner Violence: Not At Risk (02/28/2022)   Humiliation, Afraid, Rape, and Kick questionnaire    Fear of Current or Ex-Partner: No    Emotionally Abused: No    Physically Abused: No    Sexually Abused: No    Allergies  Allergen Reactions   Amoxicillin-Pot Clavulanate Itching   Other Other (See Comments)    08/06/2018 -    06/05/2016 -    09/15/2014 -    06/16/2014 -    Current Outpatient Medications  Medication Sig Dispense Refill   albuterol (PROVENTIL HFA;VENTOLIN HFA) 108 (90 BASE) MCG/ACT inhaler Inhale 2 puffs into the  lungs every 2 (two) hours as needed for wheezing or shortness of breath (cough). 1 Inhaler 0   albuterol (PROVENTIL) (2.5 MG/3ML) 0.083% nebulizer solution Take 3 mLs (2.5 mg total) by nebulization every 6 (six) hours as needed for wheezing or shortness of breath. 75 mL 12   atorvastatin (LIPITOR) 80 MG tablet Take 1 tablet (80 mg total) by mouth daily at 6 PM. 30 tablet 2   busPIRone (BUSPAR) 10 MG tablet Take 10 mg by mouth 2 (two) times daily.     Cholecalciferol (VITAMIN D) 50 MCG (2000 UT) tablet Take 2,000 Units by mouth daily.     clopidogrel (PLAVIX) 75 MG tablet Take 1 tablet (75 mg total) by mouth daily. 30 tablet 0   dapagliflozin propanediol (FARXIGA) 10 MG TABS tablet Take 1 tablet (10 mg total) by mouth daily. 30 tablet 5   desloratadine (CLARINEX) 5 MG tablet Take 5 mg by mouth daily.     ezetimibe (ZETIA) 10 MG tablet Take 10 mg by mouth daily.     FLUoxetine (PROZAC) 20 MG capsule Take 20 mg by mouth daily.     fluticasone (FLONASE) 50 MCG/ACT nasal spray Place 1 spray into both nostrils daily.     fluticasone-salmeterol (WIXELA INHUB) 500-50 MCG/ACT AEPB Inhale 1 puff into the lungs in the morning and at bedtime. 60 each 6   losartan (COZAAR) 25 MG tablet Take 1/2 tablet  (12.5 mg total) by mouth daily. 30 tablet 3   metoprolol succinate (TOPROL XL) 25 MG 24 hr tablet Take 0.5 tablets (12.5 mg total) by mouth at bedtime. 15 tablet 8   montelukast (SINGULAIR) 10 MG tablet Take 1 tablet (10 mg total) by mouth at bedtime. 30 tablet 11   pantoprazole (PROTONIX) 40 MG tablet Take 1 tablet (40 mg total) by mouth daily. 90 tablet 1   potassium chloride SA (KLOR-CON M) 20 MEQ tablet Take 1 tablet (20 mEq total) by mouth every other day on the same days that you take torsemide. 15 tablet 11   spironolactone (ALDACTONE) 25 MG tablet Take 1/2 tablet (12.5 mg total) by mouth daily. 30 tablet 3   torsemide (DEMADEX) 20 MG tablet Take 0.5 tablets (10 mg total) by mouth every other day. 30 tablet 3   No current facility-administered medications for this visit.     Family History  Problem Relation Age of Onset   Hypertension Mother    Hypertension Father    Colon cancer Father    Hypertension Sister        Physical Exam: Teeth in good repair Neuro: no focal deficits Lungs: clear Card: RR with soft systolic and diastolic murmurs Ext: no edema     Diagnostic Studies & Laboratory data: I have personally reviewed the following studies and agree with the findings    TTE (1/20204) IMPRESSIONS     1. Left ventricular ejection fraction, by estimation, is 40 to 45%. The  left ventricle has mildly decreased function. The left ventricle  demonstrates global hypokinesis. Indeterminate diastolic filling due to  E-A fusion.   2. Right ventricular systolic function is normal. The right ventricular  size is mildly enlarged. There is severely elevated pulmonary artery  systolic pressure.   3. Left atrial size was severely dilated.   4. Right atrial size was mild to moderately dilated.   5. EROA 0.37 cm2. The mitral valve is degenerative. Moderate to severe  mitral valve regurgitation.   6. Tricuspid valve regurgitation is moderate to severe.   7.  Aortic valve  regurgitation is mild. Aortic valve  sclerosis/calcification is present, without any evidence of aortic  stenosis.   8. There is mild dilatation of the ascending aorta, measuring 36 mm.   9. The inferior vena cava is normal in size with greater than 50%  respiratory variability, suggesting right atrial pressure of 3 mmHg.   Conclusion(s)/Recommendation(s): Compared to prior study EF has declined.  MR/TR has worsened. Consider ischemic evaluation, diuresis, TEE.   CATH (02/2022) Conclusion      Prox RCA lesion is 10% stenosed.   LV end diastolic pressure is moderately elevated.   There is no aortic valve stenosis.   No angiographically apparent left sided coronary artery disease.   Hemodynamic findings consistent with moderate pulmonary hypertension.   Aortic saturation 83%, PA saturation 48%, mean RA pressure 18 mmHg, PA pressure 50/28, mean PA pressure 37 mmHg, pulmonary capillary wedge pressure 35/36, mean pulmonary capillary wedge pressure 30 mmHg, cardiac output 3.92 L/min, cardiac index 2.27.   TEE (02/2022) IMPRESSIONS     1. Left ventricular ejection fraction, by estimation, is 30 to 35%. The  left ventricle has moderately decreased function. The left ventricle  demonstrates global hypokinesis. The left ventricular internal cavity size  was mildly dilated. There is mild  concentric left ventricular hypertrophy.   2. Right ventricular systolic function is mildly reduced. The right  ventricular size is mildly enlarged. There is moderately elevated  pulmonary artery systolic pressure. The estimated right ventricular  systolic pressure is Q000111Q mmHg.   3. Left atrial size was severely dilated. No left atrial/left atrial  appendage thrombus was detected.   4. Right atrial size was mildly dilated.   5. No ASD or PFO by color doppler.   6. The mitral valve is abnormal. Severe mitral valve regurgitation. The  mitral valve is thickened at the leaflet tips with poor coaptation. There   is restriction of the posterior leaflet. 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 There are 2 main jets of mitral regurgitation. PISA  measured for one jet, ERO 0.32 cm^2. The total vena contracta area was  0.58 cm^2. There was systolic flow reversal in the pulmonary vein doppler  pattern. No evidence of mitral  stenosis.   7. The tricuspid valve is abnormal. Tricuspid valve regurgitation is  moderate.   8. The aortic valve tips appeared thickened and domed. The aortic valve  is tricuspid. Aortic valve regurgitation is moderate. No aortic stenosis  is present.   9. The inferior vena cava is normal in size with greater than 50%  respiratory variability, suggesting right atrial pressure of 3 mmHg.   FINDINGS   Left Ventricle: Left ventricular ejection fraction, by estimation, is 30  to 35%. The left ventricle has moderately decreased function. The left  ventricle demonstrates global hypokinesis. The left ventricular internal  cavity size was mildly dilated.  There is mild concentric left ventricular hypertrophy.   Right Ventricle: The right ventricular size is mildly enlarged. No  increase in right ventricular wall thickness. Right ventricular systolic  function is mildly reduced. There is moderately elevated pulmonary artery  systolic pressure. The tricuspid  regurgitant velocity is 3.51 m/s, and with an assumed right atrial  pressure of 3 mmHg, the estimated right ventricular systolic pressure is  Q000111Q mmHg.   Left Atrium: Left atrial size was severely dilated. No left atrial/left  atrial appendage thrombus was detected.   Right Atrium: Right atrial size was mildly dilated.  Pericardium: Trivial pericardial effusion is present.   Mitral Valve: The mitral valve is abnormal. Severe mitral valve  regurgitation. No evidence of mitral valve stenosis. MV peak gradient, 4.0  mmHg. The mean mitral valve gradient is 2.0 mmHg.   Tricuspid  Valve: The tricuspid valve is abnormal. Tricuspid valve  regurgitation is moderate.   Aortic Valve: The aortic valve tips appeared thickened and domed. The  aortic valve is tricuspid. Aortic valve regurgitation is moderate. No  aortic stenosis is present.   Pulmonic Valve: The pulmonic valve was normal in structure. Pulmonic valve  regurgitation is trivial.   Aorta: The aortic root is normal in size and structure.   Venous: The inferior vena cava is normal in size with greater than 50%  respiratory variability, suggesting right atrial pressure of 3 mmHg.   IAS/Shunts: No ASD or PFO by color doppler.   Additional Comments: Spectral Doppler performed.   LEFT VENTRICLE  PLAX 2D  LVIDd:         5.60 cm  LVIDs:         4.80 cm  LV PW:         1.30 cm  LV IVS:        0.80 cm  LVOT diam:     2.10 cm  LVOT Area:     3.46 cm     IVC  IVC diam: 1.00 cm   MITRAL VALVE                  TRICUSPID VALVE  MV Peak grad: 4.0 mmHg        TR Peak grad:   49.3 mmHg  MV Mean grad: 2.0 mmHg        TR Vmax:        351.00 cm/s  MV Vmax:      1.00 m/s  MV Vmean:     58.9 cm/s       SHUNTS  MR Peak grad:    86.5 mmHg    Systemic Diam: 2.10 cm  MR Mean grad:    46.0 mmHg  MR Vmax:         465.00 cm/s  MR Vmean:        307.0 cm/s  MR PISA:         5.09 cm  MR PISA Eff ROA: 42 mm  MR PISA Radius:  0.90 cm   MRI (02/2022) FINDINGS: Limited images of the lung fields showed increased interstitial markings at bases and mediastinal adenopathy.   Normal left ventricular size and wall thickness, diffuse hypokinesis with EF 18%. Normal right ventricular size with EF 25%. Moderate left atrial enlargement. Mild right atrial enlargement. Trileaflet aortic valve, the valve leaflet tips are thickened, moderate aortic insufficiency visually but severe by regurgitant fraction (49%). Thickened mitral leaflet tips. There is at least moderate and possibly severe mitral regurgitation. I do not think that  calculated regurgitant fraction is going to be accurate given low stroke volume and high risk for error with MR regurgitant fraction calculation.   On delayed enhancement imaging, there was mid-wall late gadolinium enhancement (LGE) in the basal septal wall, possibly in the basal lateral wall.   Measurements: LVEDV 149 mL LVEDVi 87 mL/m2   LVSV 27 mL LVEF 18%   RVEDV 130 mL   RVEDVi 76 mL/m2   RVSV 32 mL RVEF 25%   Aortic forward volume 19 mL   Aortic regurgitant fraction 49%   T1 1014, ECV 37%   IMPRESSION: 1.  Normal LV size with diffuse hypokinesis, EF 18%.   2.  Normal RV size with EF 25%.   3. Aortic insufficiency looks at least moderate, regurgitant fraction 49% suggests severe aortic insufficiency. Thickened leaflet tips.   4. Thickened mitral valve leaflet tips. At least moderate and probably severe mitral regurgitation. Low stroke volume introduces error to the regurgitant fraction calculation.   5. Mid wall LGE in the basal septal and possible subtle LGE in the basal lateral wall. This is not a CAD pattern, possible prior myocarditis. Cannot fully rule out cardiac sarcoidosis involvement given mediastinal lymphadenopathy.   6. Elevated extracellular volume percentage suggests increased fibrotic content in LV myocardium.    Recent Radiology Findings:   CT Chest (12/2021)  FINDINGS: Cardiovascular: 3 Coronary artery calcification and aortic atherosclerotic calcification.   Mediastinum/Nodes: Enlarged RIGHT lower paratracheal lymph node measuring 19 mm short axis compares to 11 mm. High RIGHT paratracheal node measuring 14 mm (image 35/2) compares to 11 mm.   RIGHT hilar lymph node measures 13 mm.   Lungs/Pleura: Interval resolution of the LEFT upper lobe nodule of concern. No LEFT lung nodules.   There is dense consolidation with air bronchograms within the lateral segment of the RIGHT lower lobe. This large region of segmental consolidation  measuring up to 6.2 cm. There is ground-glass density and atelectasis in the posterior RIGHT upper lobe. There is small layering pleural fluid with greater volume of fluid in the upper lobes.   Upper Abdomen: Limited view of the liver, kidneys, pancreas are unremarkable. Normal adrenal glands.   Musculoskeletal: No aggressive osseous lesion.   IMPRESSION: 1. New segmental consolidation with air bronchograms in the RIGHT lower lobe is concerning for pneumonia. Recommend clinical correlation for pulmonary infection. 2. New RIGHT paratracheal adenopathy with differential including reactive adenopathy related to RIGHT lower lobe pneumonia versus metastatic adenopathy. The nodes are larger than typically present with reactive adenopathy. Patient may benefit from an FDG PET scan if no signs of current pulmonary infection. Regardless patient warts follow-up CT scan to demonstrate resolution pneumonia and adenopathy. 3. Resolution of the LEFT upper lobe nodule of concern. 4. Mild atelectasis and effusion in the posterior RIGHT upper lobe.      Recent Lab Findings: Lab Results  Component Value Date   WBC 4.2 04/16/2022   HGB 12.5 04/16/2022   HCT 37.7 04/16/2022   PLT 238 04/16/2022   GLUCOSE 115 (H) 04/15/2022   CHOL 193 06/17/2019   TRIG 71 06/17/2019   HDL 71 06/17/2019   LDLCALC 109 (H) 06/17/2019   ALT 44 03/18/2022   AST 41 03/18/2022   NA 137 04/15/2022   K 3.6 04/15/2022   CL 100 04/15/2022   CREATININE 1.35 (H) 04/15/2022   BUN 31 (H) 04/15/2022   CO2 26 04/15/2022   TSH 0.795 06/17/2019   INR 1.0 06/19/2021   HGBA1C 5.8 (H) 06/17/2019      Assessment / Plan:     72 yo female with NYHA class 2 symptoms of severe MR and moderate AI and TR. Pt with nonischemic cardiomyopathy with EF from 18-45% by multiple studies. Pt without diagnosis of sarcoidosis or ca from mediastinal work up and valvular pathology appears rheumatic. I feel that with pt's cardiomyopathy her  risk with AVR/MVR/TV repair with mortality 5% and with possible need for inotropic or mechanical support post op. She will need to be off her plavix for 7 days and we have gone over all the goals and risks of surgery and  she wishes to proceed.    I have spent 65 min in review of the records, viewing studies and in face to face with patient and in coordination of future care    Coralie Common 04/22/2022 8:12 AM

## 2022-04-23 ENCOUNTER — Telehealth: Payer: Self-pay | Admitting: Pulmonary Disease

## 2022-04-23 NOTE — Telephone Encounter (Signed)
PT just had a Bronchoscopy with Dr. Valeta Harms and has a very sore throat. Pls call to advise @ [phone]   Pharm is: [facility] on [address]

## 2022-04-23 NOTE — Telephone Encounter (Signed)
Called and spoke to pt. Pt c/o sore throat since bronch on 2/27. Pt states the sore throat is unchanged since the bronch and is now accompanied with a prod cough with white mucus. Pt denies ShOB, wheezing, CP/tightness, fever, swelling, dizziness. Pt states she would like a response today. Thanks!  Dr. Valeta Harms, please advise. Thanks.

## 2022-04-23 NOTE — Telephone Encounter (Signed)
Called and spoke to pt. Informed her of the recs per Dr. Icard. Pt verbalized understanding and denied any further questions or concerns at this time.   

## 2022-04-30 ENCOUNTER — Telehealth: Payer: Self-pay

## 2022-04-30 NOTE — Telephone Encounter (Signed)
Spoke to pt about path results. Patient verbalized understanding. Nothing further needed. Had to cancel pt's appt with Dr Erin Fulling pt having aortic valve replacement. Pt states she will call to resch when she is recovered.

## 2022-05-02 NOTE — Pre-Procedure Instructions (Signed)
Surgical Instructions    Your procedure is scheduled on Tuesday, May 07, 2022 at 7:30 AM.  Report to [facility] Main Entrance "A" at 5:30 A.M., then check in with the Admitting office.  Call this number if you have problems the morning of surgery:  [phone]   If you have any questions prior to your surgery date call [phone]: Open Monday-Friday 8am-4pm  *If you experience any cold or flu symptoms such as cough, fever, chills, shortness of breath, etc. between now and your scheduled surgery, please notify us.*    Remember:  Do not eat or drink after midnight the night before your surgery   Take these medicines the morning of surgery with A SIP OF WATER:  busPIRone (BUSPAR)  carvedilol (COREG)  desloratadine (CLARINEX)  ezetimibe (ZETIA)  FLUoxetine (PROZAC)  fluticasone (FLONASE)  fluticasone-salmeterol (WIXELA INHUB)  pantoprazole (PROTONIX)   IF NEEDED: albuterol (PROVENTIL HFA;VENTOLIN HFA)  albuterol (PROVENTIL)   Please bring all inhalers with you the day of surgery.    Stop taking your dapagliflozin propanediol (FARXIGA) 3 days prior to surgery. Your last dose should be on 05/03/22.  Per your Doctor's instructions, STOP clopidogrel (PLAVIX) 1 week prior to surgery.  As of today, STOP taking any Aspirin (unless otherwise instructed by your surgeon) Aleve, Naproxen, Ibuprofen, Motrin, Advil, Goody's, BC's, all herbal medications, fish oil, and all vitamins.                    Do NOT Smoke (Tobacco/Vaping) for 24 hours prior to your procedure.  If you use a CPAP at night, you may bring your mask/headgear for your overnight stay.   Contacts, glasses, piercing's, hearing aid's, dentures or partials may not be worn into surgery, please bring cases for these belongings.    For patients admitted to the hospital, discharge time will be determined by your treatment team.   Patients discharged the day of surgery will not be allowed to drive home, and someone needs  to stay with them for 24 hours.  SURGICAL WAITING ROOM VISITATION Patients having surgery or a procedure may have 2 support people in the waiting area. Visitors may stay in the waiting area during the procedure and switch out with other visitors if needed. Only 1 support person is allowed in the pre-op area with the patient AFTER the patient is prepped. This person cannot be switched out.  Children under the age of 64 must have an adult accompany them who is not the patient. If the patient needs to stay at the hospital during part of their recovery, the visitor guidelines for inpatient rooms apply.  Please refer to the [facility] website for the visitor guidelines for Inpatients (after your surgery is over and you are in a regular room).    Special instructions:   [facility]- Preparing For Surgery  Before surgery, you can play an important role. Because skin is not sterile, your skin needs to be as free of germs as possible. You can reduce the number of germs on your skin by washing with CHG (chlorahexidine gluconate) Soap before surgery.  CHG is an antiseptic cleaner which kills germs and bonds with the skin to continue killing germs even after washing.    Oral Hygiene is also important to reduce your risk of infection.  Remember - BRUSH YOUR TEETH THE MORNING OF SURGERY WITH YOUR REGULAR TOOTHPASTE  Please do not use if you have an allergy to CHG or antibacterial soaps. If your skin becomes reddened/irritated  stop using the CHG.  Do not shave (including legs and underarms) for at least 48 hours prior to first CHG shower. It is OK to shave your face.  Please follow these instructions carefully.   Shower the NIGHT BEFORE SURGERY and the MORNING OF SURGERY  If you chose to wash your hair, wash your hair first as usual with your normal shampoo.  After you shampoo, rinse your hair and body thoroughly to remove the shampoo.  Use CHG Soap as you would any other liquid soap. You can apply  CHG directly to the skin and wash gently with a scrungie or a clean washcloth.   Apply the CHG Soap to your body ONLY FROM THE NECK DOWN (neck, arms, chest, abdomen, legs, and back).  Do not use on open wounds or open sores. Avoid contact with your eyes, ears, mouth and genitals (private parts). Wash Face and genitals (private parts)  with your normal soap.   Wash thoroughly, paying special attention to the area where your surgery will be performed.  Thoroughly rinse your body with warm water from the neck down.  DO NOT shower/wash with your normal soap after using and rinsing off the CHG Soap.  Pat yourself dry with a CLEAN TOWEL.  Wear CLEAN PAJAMAS to bed the night before surgery  Place CLEAN SHEETS on your bed the night before your surgery  DO NOT SLEEP WITH PETS.   Day of Surgery: Take a shower with CHG soap. Do not wear lotions, powders, perfumes, or deodorant. Do not wear jewelry or makeup Do not shave 48 hours prior to surgery. Do not wear nail polish, gel polish, artificial nails, or any other type of covering on natural nails (fingers and toes) If you have artificial nails or gel coating that need to be removed by a nail salon, please have this removed prior to surgery. Artificial nails or gel coating may interfere with anesthesia's ability to adequately monitor your vital signs. Wear Clean/Comfortable clothing the morning of surgery Do not bring valuables to the hospital.  [facility] is not responsible for any belongings or valuables. Remember to brush your teeth WITH YOUR REGULAR TOOTHPASTE.   Please read over the following fact sheets that you were given.  If you received a COVID test during your pre-op visit  it is requested that you wear a mask when out in public, stay away from anyone that may not be feeling well and notify your surgeon if you develop symptoms. If you have been in contact with anyone that has tested positive in the last 10 days please notify you  surgeon.

## 2022-05-02 NOTE — Pre-Procedure Instructions (Signed)
Surgical Instructions    Your procedure is scheduled on Tuesday, May 07, 2022 at 7:30 AM.  Report to [facility] Main Entrance "A" at 5:30 A.M., then check in with the Admitting office.  Call this number if you have problems the morning of surgery:  [phone]   If you have any questions prior to your surgery date call [phone]: Open Monday-Friday 8am-4pm  *If you experience any cold or flu symptoms such as cough, fever, chills, shortness of breath, etc. between now and your scheduled surgery, please notify us.*    Remember:  Do not eat or drink after midnight the night before your surgery   Take these medicines the morning of surgery with A SIP OF WATER:  busPIRone (BUSPAR)  carvedilol (COREG)  desloratadine (CLARINEX)  ezetimibe (ZETIA)  FLUoxetine (PROZAC)  fluticasone (FLONASE)  fluticasone-salmeterol (WIXELA INHUB)  pantoprazole (PROTONIX)   IF NEEDED: albuterol (PROVENTIL HFA;VENTOLIN HFA)  albuterol (PROVENTIL)   Please bring all inhalers with you the day of surgery.    Stop taking your dapagliflozin propanediol (FARXIGA) 72 hours prior to surgery. Your last dose should be on 05/03/22.   Follow your surgeon's instructions on when to stop clopidogrel (PLAVIX).  If no instructions were given by your surgeon then you will need to call the office to get those instructions.     As of today, STOP taking any Aspirin (unless otherwise instructed by your surgeon) Aleve, Naproxen, Ibuprofen, Motrin, Advil, Goody's, BC's, all herbal medications, fish oil, and all vitamins.                     Do NOT Smoke (Tobacco/Vaping) for 24 hours prior to your procedure.  If you use a CPAP at night, you may bring your mask/headgear for your overnight stay.   Contacts, glasses, piercing's, hearing aid's, dentures or partials may not be worn into surgery, please bring cases for these belongings.    For patients admitted to the hospital, discharge time will be determined by your  treatment team.   Patients discharged the day of surgery will not be allowed to drive home, and someone needs to stay with them for 24 hours.  SURGICAL WAITING ROOM VISITATION Patients having surgery or a procedure may have 2 support people in the waiting area. Visitors may stay in the waiting area during the procedure and switch out with other visitors if needed. Only 1 support person is allowed in the pre-op area with the patient AFTER the patient is prepped. This person cannot be switched out.  Children under the age of 42 must have an adult accompany them who is not the patient. If the patient needs to stay at the hospital during part of their recovery, the visitor guidelines for inpatient rooms apply.  Please refer to the [facility] website for the visitor guidelines for Inpatients (after your surgery is over and you are in a regular room).    Special instructions:   [employer]- Preparing For Surgery  Before surgery, you can play an important role. Because skin is not sterile, your skin needs to be as free of germs as possible. You can reduce the number of germs on your skin by washing with CHG (chlorahexidine gluconate) Soap before surgery.  CHG is an antiseptic cleaner which kills germs and bonds with the skin to continue killing germs even after washing.    Oral Hygiene is also important to reduce your risk of infection.  Remember - BRUSH YOUR TEETH THE MORNING OF SURGERY  WITH YOUR REGULAR TOOTHPASTE  Please do not use if you have an allergy to CHG or antibacterial soaps. If your skin becomes reddened/irritated stop using the CHG.  Do not shave (including legs and underarms) for at least 48 hours prior to first CHG shower. It is OK to shave your face.  Please follow these instructions carefully.   Shower the NIGHT BEFORE SURGERY and the MORNING OF SURGERY  If you chose to wash your hair, wash your hair first as usual with your normal shampoo.  After you shampoo, rinse your hair  and body thoroughly to remove the shampoo.  Use CHG Soap as you would any other liquid soap. You can apply CHG directly to the skin and wash gently with a scrungie or a clean washcloth.   Apply the CHG Soap to your body ONLY FROM THE NECK DOWN (neck, arms, chest, abdomen, legs, and back).  Do not use on open wounds or open sores. Avoid contact with your eyes, ears, mouth and genitals (private parts). Wash Face and genitals (private parts)  with your normal soap.   Wash thoroughly, paying special attention to the area where your surgery will be performed.  Thoroughly rinse your body with warm water from the neck down.  DO NOT shower/wash with your normal soap after using and rinsing off the CHG Soap.  Pat yourself dry with a CLEAN TOWEL.  Wear CLEAN PAJAMAS to bed the night before surgery  Place CLEAN SHEETS on your bed the night before your surgery  DO NOT SLEEP WITH PETS.   Day of Surgery: Take a shower with CHG soap. Do not wear lotions, powders, perfumes/colognes, or deodorant. Do not wear jewelry or makeup Do not shave 48 hours prior to surgery. Do not wear nail polish, gel polish, artificial nails, or any other type of covering on natural nails (fingers and toes) If you have artificial nails or gel coating that need to be removed by a nail salon, please have this removed prior to surgery. Artificial nails or gel coating may interfere with anesthesia's ability to adequately monitor your vital signs. Wear Clean/Comfortable clothing the morning of surgery Do not bring valuables to the hospital.  [facility] is not responsible for any belongings or valuables. Remember to brush your teeth WITH YOUR REGULAR TOOTHPASTE.   Please read over the following fact sheets that you were given.  If you received a COVID test during your pre-op visit  it is requested that you wear a mask when out in public, stay away from anyone that may not be feeling well and notify your surgeon if you develop  symptoms. If you have been in contact with anyone that has tested positive in the last 10 days please notify you surgeon.

## 2022-05-03 ENCOUNTER — Other Ambulatory Visit: Payer: Self-pay

## 2022-05-03 ENCOUNTER — Ambulatory Visit (HOSPITAL_COMMUNITY)
Admission: RE | Admit: 2022-05-03 | Discharge: 2022-05-03 | Disposition: A | Discharge status: Home / Self Care | Payer: Medicare HMO | Source: Ambulatory Visit | Attending: Thoracic Surgery (Cardiothoracic Vascular Surgery) | Admitting: Thoracic Surgery (Cardiothoracic Vascular Surgery)

## 2022-05-03 ENCOUNTER — Encounter (HOSPITAL_COMMUNITY): Payer: Self-pay

## 2022-05-03 ENCOUNTER — Encounter (HOSPITAL_COMMUNITY)
Admission: RE | Admit: 2022-05-03 | Discharge: 2022-05-03 | Disposition: A | Discharge status: Home / Self Care | Payer: Medicare HMO | Source: Ambulatory Visit | Attending: Thoracic Surgery (Cardiothoracic Vascular Surgery) | Admitting: Thoracic Surgery (Cardiothoracic Vascular Surgery)

## 2022-05-03 ENCOUNTER — Other Ambulatory Visit: Payer: Self-pay | Admitting: Nurse Practitioner

## 2022-05-03 VITALS — BP 127/64 | HR 88 | Temp 97.7°F | Resp 18 | Ht 66.0 in | Wt 139.4 lb

## 2022-05-03 DIAGNOSIS — E785 Hyperlipidemia, unspecified: Secondary | ICD-10-CM | POA: Insufficient documentation

## 2022-05-03 DIAGNOSIS — I110 Hypertensive heart disease with heart failure: Secondary | ICD-10-CM | POA: Insufficient documentation

## 2022-05-03 DIAGNOSIS — Z79899 Other long term (current) drug therapy: Secondary | ICD-10-CM | POA: Insufficient documentation

## 2022-05-03 DIAGNOSIS — Z1152 Encounter for screening for COVID-19: Secondary | ICD-10-CM | POA: Insufficient documentation

## 2022-05-03 DIAGNOSIS — R9431 Abnormal electrocardiogram [ECG] [EKG]: Secondary | ICD-10-CM | POA: Diagnosis not present

## 2022-05-03 DIAGNOSIS — Z01818 Encounter for other preprocedural examination: Secondary | ICD-10-CM

## 2022-05-03 DIAGNOSIS — I340 Nonrheumatic mitral (valve) insufficiency: Secondary | ICD-10-CM

## 2022-05-03 DIAGNOSIS — Z87891 Personal history of nicotine dependence: Secondary | ICD-10-CM | POA: Insufficient documentation

## 2022-05-03 DIAGNOSIS — I5032 Chronic diastolic (congestive) heart failure: Secondary | ICD-10-CM | POA: Insufficient documentation

## 2022-05-03 DIAGNOSIS — I351 Nonrheumatic aortic (valve) insufficiency: Secondary | ICD-10-CM

## 2022-05-03 DIAGNOSIS — Z8673 Personal history of transient ischemic attack (TIA), and cerebral infarction without residual deficits: Secondary | ICD-10-CM | POA: Diagnosis not present

## 2022-05-03 LAB — URINALYSIS, ROUTINE W REFLEX MICROSCOPIC
Bacteria, UA: NONE SEEN
Bilirubin Urine: NEGATIVE
Glucose, UA: >=500 mg/dL — AB
Hgb urine dipstick: NEGATIVE
Ketones, ur: NEGATIVE mg/dL
Leukocytes,Ua: NEGATIVE
Nitrite: NEGATIVE
Protein, ur: NEGATIVE mg/dL
Specific Gravity, Urine: 1.015 (ref 1.005–1.030)
pH: 6 (ref 5.0–8.0)

## 2022-05-03 LAB — CBC
HCT: 35.9 % — ABNORMAL LOW (ref 36.0–46.0)
Hemoglobin: 12.1 g/dL (ref 12.0–15.0)
MCH: 29.9 pg (ref 26.0–34.0)
MCHC: 33.7 g/dL (ref 30.0–36.0)
MCV: 88.6 fL (ref 80.0–100.0)
Platelets: 300 10*3/uL (ref 150–400)
RBC: 4.05 MIL/uL (ref 3.87–5.11)
RDW: 15.1 % (ref 11.5–15.5)
WBC: 6.9 10*3/uL (ref 4.0–10.5)
nRBC: 0 % (ref 0.0–0.2)

## 2022-05-03 LAB — APTT: aPTT: 28 seconds (ref 24–36)

## 2022-05-03 LAB — BLOOD GAS, ARTERIAL
Acid-base deficit: 3.6 mmol/L — ABNORMAL HIGH (ref 0.0–2.0)
Bicarbonate: 21.5 mmol/L (ref 20.0–28.0)
Drawn by: 6643
O2 Saturation: 98.4 %
Patient temperature: 37
pCO2 arterial: 38 mmHg (ref 32–48)
pH, Arterial: 7.36 (ref 7.35–7.45)
pO2, Arterial: 91 mmHg (ref 83–108)

## 2022-05-03 LAB — COMPREHENSIVE METABOLIC PANEL
ALT: 21 U/L (ref 0–44)
AST: 28 U/L (ref 15–41)
Albumin: 3.4 g/dL — ABNORMAL LOW (ref 3.5–5.0)
Alkaline Phosphatase: 94 U/L (ref 38–126)
Anion gap: 10 (ref 5–15)
BUN: 25 mg/dL — ABNORMAL HIGH (ref 8–23)
CO2: 18 mmol/L — ABNORMAL LOW (ref 22–32)
Calcium: 9 mg/dL (ref 8.9–10.3)
Chloride: 107 mmol/L (ref 98–111)
Creatinine, Ser: 1.17 mg/dL — ABNORMAL HIGH (ref 0.44–1.00)
GFR, Estimated: 50 mL/min — ABNORMAL LOW (ref >60)
Glucose, Bld: 102 mg/dL — ABNORMAL HIGH (ref 70–99)
Potassium: 4.9 mmol/L (ref 3.5–5.1)
Sodium: 135 mmol/L (ref 135–145)
Total Bilirubin: 1.6 mg/dL — ABNORMAL HIGH (ref 0.3–1.2)
Total Protein: 6.7 g/dL (ref 6.5–8.1)

## 2022-05-03 LAB — HEMOGLOBIN A1C
Hgb A1c MFr Bld: 7.1 % — ABNORMAL HIGH (ref 4.8–5.6)
Mean Plasma Glucose: 157 mg/dL

## 2022-05-03 LAB — PROTIME-INR
INR: 1 (ref 0.8–1.2)
Prothrombin Time: 12.7 seconds (ref 11.4–15.2)

## 2022-05-03 LAB — SURGICAL PCR SCREEN
MRSA, PCR: NEGATIVE
Staphylococcus aureus: NEGATIVE

## 2022-05-03 LAB — SARS CORONAVIRUS 2 (TAT 6-24 HRS): SARS Coronavirus 2: NEGATIVE

## 2022-05-03 NOTE — Progress Notes (Signed)
PCP: Sheppard Evens FNP Cardiologist: Dr. Gwenlyn Found Pulmonology: Dr. Erin Fulling  EKG: Today CXR: Today ECHO: 03-01-22 Stress Test: denies Cardiac Cath: 02-28-22  Pt denies being a diabetic. Reports she takes [place] for her heart. LD 05-03-22  Plavix: stop 1 week: LD 04-29-22  Pt previously had a blood refusal on file. CONFIRMED WITH PATIENT: SHE IS WILLING TO ACCEPT BLOOD PRODUCTS. BLOOD CONSENT SIGNED FOR SURGERY. Blood Bank called, a blood consent copy sent to update their records.  Patient denies shortness of breath, fever, cough, and chest pain at PAT appointment.  Patient verbalized understanding of instructions provided today at the PAT appointment.  Patient asked to review instructions at home and day of surgery.   Chart forwarded to anesthesia for review.

## 2022-05-06 MED ORDER — TRANEXAMIC ACID (OHS) BOLUS VIA INFUSION
15.0000 mg/kg | INTRAVENOUS | Status: AC
  Administered 2022-05-07: 948 mg via INTRAVENOUS
  Filled 2022-05-06: qty 948

## 2022-05-06 MED ORDER — MANNITOL 20 % IV SOLN
INTRAVENOUS | Status: DC
  Filled 2022-05-06 (×3): qty 13

## 2022-05-06 MED ORDER — EPINEPHRINE HCL 5 MG/250ML IV SOLN IN NS
0.0000 ug/min | INTRAVENOUS | Status: AC
  Administered 2022-05-07: 2 ug/min via INTRAVENOUS
  Filled 2022-05-06 (×2): qty 250

## 2022-05-06 MED ORDER — NITROGLYCERIN IN D5W 200-5 MCG/ML-% IV SOLN
2.0000 ug/min | INTRAVENOUS | Status: DC
  Filled 2022-05-06 (×2): qty 250

## 2022-05-06 MED ORDER — INSULIN REGULAR(HUMAN) IN NACL 100-0.9 UT/100ML-% IV SOLN
INTRAVENOUS | Status: AC
  Administered 2022-05-07: .8 [IU]/h via INTRAVENOUS
  Filled 2022-05-06 (×2): qty 100

## 2022-05-06 MED ORDER — DEXMEDETOMIDINE HCL IN NACL 400 MCG/100ML IV SOLN
0.1000 ug/kg/h | INTRAVENOUS | Status: AC
  Administered 2022-05-07: .2 ug/kg/h via INTRAVENOUS
  Filled 2022-05-06 (×2): qty 100

## 2022-05-06 MED ORDER — TRANEXAMIC ACID 1000 MG/10ML IV SOLN
1.5000 mg/kg/h | INTRAVENOUS | Status: AC
  Administered 2022-05-07: 1.5 mg/kg/h via INTRAVENOUS
  Filled 2022-05-06 (×2): qty 25

## 2022-05-06 MED ORDER — CEFAZOLIN SODIUM-DEXTROSE 2-4 GM/100ML-% IV SOLN
2.0000 g | INTRAVENOUS | Status: AC
  Administered 2022-05-07: 2 g via INTRAVENOUS
  Filled 2022-05-06 (×2): qty 100

## 2022-05-06 MED ORDER — VANCOMYCIN HCL 1000 MG IV SOLR
INTRAVENOUS | Status: DC
  Filled 2022-05-06 (×2): qty 20

## 2022-05-06 MED ORDER — PHENYLEPHRINE HCL-NACL 20-0.9 MG/250ML-% IV SOLN
30.0000 ug/min | INTRAVENOUS | Status: AC
  Administered 2022-05-07 (×2): 120 ug via INTRAVENOUS
  Administered 2022-05-07 (×2): 80 ug via INTRAVENOUS
  Administered 2022-05-07: 40 ug via INTRAVENOUS
  Administered 2022-05-07: 80 ug via INTRAVENOUS
  Administered 2022-05-07: 40 ug via INTRAVENOUS
  Administered 2022-05-07 (×2): 80 ug via INTRAVENOUS
  Administered 2022-05-07: 20 ug/min via INTRAVENOUS
  Administered 2022-05-07: 80 ug via INTRAVENOUS
  Filled 2022-05-06 (×2): qty 250

## 2022-05-06 MED ORDER — TRANEXAMIC ACID (OHS) PUMP PRIME SOLUTION
2.0000 mg/kg | INTRAVENOUS | Status: DC
  Filled 2022-05-06 (×2): qty 1.26

## 2022-05-06 MED ORDER — HEPARIN 30,000 UNITS/1000 ML (OHS) CELLSAVER SOLUTION
Status: DC
  Filled 2022-05-06 (×2): qty 1000

## 2022-05-06 MED ORDER — VANCOMYCIN HCL 1250 MG/250ML IV SOLN
1250.0000 mg | INTRAVENOUS | Status: AC
  Administered 2022-05-07: 1250 mg via INTRAVENOUS
  Filled 2022-05-06 (×2): qty 250

## 2022-05-06 MED ORDER — POTASSIUM CHLORIDE 2 MEQ/ML IV SOLN
80.0000 meq | INTRAVENOUS | Status: DC
  Filled 2022-05-06 (×2): qty 40

## 2022-05-06 MED ORDER — NOREPINEPHRINE 4 MG/250ML-% IV SOLN
0.0000 ug/min | INTRAVENOUS | Status: AC
  Administered 2022-05-07: 2 ug/min via INTRAVENOUS
  Filled 2022-05-06 (×2): qty 250

## 2022-05-06 MED ORDER — MILRINONE LACTATE IN DEXTROSE 20-5 MG/100ML-% IV SOLN
0.3000 ug/kg/min | INTRAVENOUS | Status: DC
  Filled 2022-05-06 (×2): qty 100

## 2022-05-06 MED ORDER — PLASMA-LYTE A IV SOLN
INTRAVENOUS | Status: DC
  Filled 2022-05-06 (×2): qty 2.5

## 2022-05-06 NOTE — Anesthesia Preprocedure Evaluation (Addendum)
Anesthesia Evaluation  Patient identified by MRN, date of birth, ID band Patient awake    Reviewed: Allergy & Precautions, NPO status , Patient's Chart, lab work & pertinent test results, reviewed documented beta blocker date and time   History of Anesthesia Complications Negative for: history of anesthetic complications  Airway Mallampati: II  TM Distance: >3 FB Neck ROM: Full    Dental  (+) Dental Advisory Given, Missing, Chipped   Pulmonary asthma , sleep apnea and Continuous Positive Airway Pressure Ventilation , COPD,  COPD inhaler, former smoker   breath sounds clear to auscultation       Cardiovascular hypertension, Pt. on home beta blockers and Pt. on medications (-) angina + CAD (minimal 10% RCA) and +CHF  + dysrhythmias Supra Ventricular Tachycardia  Rhythm:Regular Rate:Normal + Systolic murmurs TEE [id number]: EF 30-35%, LV has mod decreased LVF, global hypokinesis, mild LVH, mildly reduced RVF with mod pulm HTN, Severe MR with thickened leaflets, posterior leaflet restriction, ERO 0.32 with vena contracta [id number], systolic flow reversal, mod TR, tricuspid AV with mod AI   Neuro/Psych   Anxiety     TIA   GI/Hepatic Neg liver ROS,GERD  Medicated and Controlled,,  Endo/Other  diabetes (pre-diabetic, glu 131)    Renal/GU Renal InsufficiencyRenal disease     Musculoskeletal  (+) Arthritis ,    Abdominal   Peds  Hematology  (+) Blood dyscrasia (Hb 8.8, plt 300), anemia   Anesthesia Other Findings   Reproductive/Obstetrics                              Anesthesia Physical Anesthesia Plan  ASA: 4  Anesthesia Plan: General   Post-op Pain Management:    Induction: Intravenous  PONV Risk Score and Plan: 3 and Treatment may vary due to age or medical condition  Airway Management Planned: Oral ETT  Additional Equipment: Arterial line, PA Cath, TEE, Ultrasound Guidance Line Placement and  3D TEE  Intra-op Plan:   Post-operative Plan: Post-operative intubation/ventilation  Informed Consent: I have reviewed the patients History and Physical, chart, labs and discussed the procedure including the risks, benefits and alternatives for the proposed anesthesia with the patient or authorized representative who has indicated his/her understanding and acceptance.     Dental advisory given  Plan Discussed with: CRNA and Surgeon  Anesthesia Plan Comments: (PAT note written 05/06/2022 by Myra Gianotti, PA-C.  )        Anesthesia Quick Evaluation

## 2022-05-06 NOTE — Progress Notes (Signed)
Anesthesia Chart Review:  Case: [id number] Date/Time: 05/07/22 0715   Procedures:      AORTIC VALVE REPLACEMENT (AVR) (Chest)     MITRAL VALVE (MV) REPLACEMENT (Chest)     TRICUSPID VALVE REPAIR     TRANSESOPHAGEAL ECHOCARDIOGRAM   Anesthesia type: General   Pre-op diagnosis:      SEVERE MR     MODERATE AI      MODERATE TR     CHF   Location: [facility] OR ROOM 15 / [facility]   Surgeons: Coralie Common, MD       DISCUSSION: Patient is a 72 year old [name] scheduled for the above procedure.  History includes former smoker (quit 11/10/98), HTN, HLD, valvular regurgitation (MR, AR, TR), HFrEF,  non-ischemic cardiomyopathy, SVT (s/p adenosine 03/01/22), TIA (~ 2020), GERD, asthma, chronic low back pain. 05/03/22 A1c of 7.1% suggests diabetes.  S/p video bronchoscopy with EBUS 04/16/22 for valuation of mediastinal adenopathy.  Cytology of [address] LN FNA was negative for malignancy, scant cellularity compatible with benign lymph node.   Per 05/03/22 PAT RN [name] documentation, "Pt previously had a blood refusal on file. CONFIRMED WITH PATIENT: SHE IS WILLING TO ACCEPT BLOOD PRODUCTS. BLOOD CONSENT SIGNED FOR SURGERY. Blood Bank called, a blood consent copy sent to update their records."  She reported being on Fargixa for HF and not DM. A1c on 05/03/22 (pre-operative labs) was 7.1%, suggesting diabetes, so communication sent to Dr. Lavonna Monarch for follow-up purposes.  She reported last Plavix 04/29/22.    05/03/22 presurgical COVID-19 test was negative.  Anesthesia team to evaluate on the day of surgery.   VS: BP 127/64   Pulse 88   Temp 36.5 C (Oral)   Resp 18   Ht 5\' 6"  (1.676 m)   Wt 63.2 kg   SpO2 97%   BMI 22.50 kg/m    PROVIDERS: Demetrios Isaacs, FNP is PCP Quay Burow, MD is cardiologist  Loralie Champagne, MD is HF cardiologist Freda Jackson, MD is pulmonologist   LABS: Labs reviewed: Acceptable for surgery. (all labs ordered are listed, but only abnormal results are displayed)  Labs  Reviewed  CBC - Abnormal; Notable for the following components:      Result Value   HCT 35.9 (*)    All other components within normal limits  COMPREHENSIVE METABOLIC PANEL - Abnormal; Notable for the following components:   CO2 18 (*)    Glucose, Bld 102 (*)    BUN 25 (*)    Creatinine, Ser 1.17 (*)    Albumin 3.4 (*)    Total Bilirubin 1.6 (*)    GFR, Estimated 50 (*)    All other components within normal limits  URINALYSIS, ROUTINE W REFLEX MICROSCOPIC - Abnormal; Notable for the following components:   Glucose, UA >=500 (*)    All other components within normal limits  BLOOD GAS, ARTERIAL - Abnormal; Notable for the following components:   Acid-base deficit 3.6 (*)    All other components within normal limits  HEMOGLOBIN A1C - Abnormal; Notable for the following components:   Hgb A1c MFr Bld 7.1 (*)    All other components within normal limits  SURGICAL PCR SCREEN  SARS CORONAVIRUS 2 (TAT 6-24 HRS)  PROTIME-INR  APTT  TYPE AND SCREEN     IMAGES: CXR 05/03/22: FINDINGS: Cardiomediastinal silhouette unchanged. No interlobular septal thickening. Interval resolution of the prior multifocal airspace and interstitial opacities of the lungs. No pneumothorax or pleural effusion. No new airspace disease. Degenerative changes of  the spine. IMPRESSION: Interval resolution of multifocal infection, with no evidence on the current of acute cardiopulmonary disease   PET Scan 03/01/22: IMPRESSION: 1. The mediastinal adenopathy described on the prior chest CT is hypermetabolic. Favor neoplastic (lymphoproliferative disorder versus occult primary bronchogenic carcinoma) over reactive etiologies. Consider tissue sampling. 2. Improved right lower lobe airspace disease, favoring resolving infection. 3. Increased right and new small left pleural effusions, suggesting pulmonary edema. 4. Left adrenal hypermetabolism without well-defined mass. Recommend attention on follow-up. 5.  Incidental findings, including: Aortic atherosclerosis (ICD10-I70.0), coronary artery atherosclerosis and emphysema (ICD10-J43.9).   EKG: 05/03/22: Normal sinus rhythm ST & T wave abnormality, consider inferior ischemia Abnormal ECG Confirmed by Kathlyn Sacramento [phone]) on 05/03/2022 5:31:07 PM   CV: US Carotid 05/03/22 Summary:  - Right Carotid: Velocities in the right ICA are consistent with a 1-39%  stenosis.  - Left Carotid: Velocities in the left ICA are consistent with a 1-39%  stenosis.  - Vertebrals: Bilateral vertebral arteries demonstrate antegrade flow.  - Subclavians: Normal flow hemodynamics were seen in bilateral subclavian               arteries.    MRI Cardiac 03/01/22 IMPRESSION: 1.  Normal LV size with diffuse hypokinesis, EF 18%. 2.  Normal RV size with EF 25%. 3. Aortic insufficiency looks at least moderate, regurgitant fraction 49% suggests severe aortic insufficiency. Thickened leaflet tips. 4. Thickened mitral valve leaflet tips. At least moderate and probably severe mitral regurgitation. Low stroke volume introduces error to the regurgitant fraction calculation. 5. Mid wall LGE in the basal septal and possible subtle LGE in the basal lateral wall. This is not a CAD pattern, possible prior myocarditis. Cannot fully rule out cardiac sarcoidosis involvement given mediastinal lymphadenopathy. 6. Elevated extracellular volume percentage suggests increased fibrotic content in LV myocardium.    TEE 03/01/22 IMPRESSIONS   1. Left ventricular ejection fraction, by estimation, is 30 to 35%. The  left ventricle has moderately decreased function. The left ventricle  demonstrates global hypokinesis. The left ventricular internal cavity size  was mildly dilated. There is mild  concentric left ventricular hypertrophy.   2. Right ventricular systolic function is mildly reduced. The right  ventricular size is mildly enlarged. There is moderately elevated  pulmonary  artery systolic pressure. The estimated right ventricular  systolic pressure is Q000111Q mmHg.   3. Left atrial size was severely dilated. No left atrial/left atrial  appendage thrombus was detected.   4. Right atrial size was mildly dilated.   5. No ASD or PFO by color doppler.   6. The mitral valve is abnormal. Severe mitral valve regurgitation. The  mitral valve is thickened at the leaflet tips with poor coaptation. There  is restriction of the posterior leaflet. 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 There are 2 main jets of mitral regurgitation. PISA  measured for one jet, ERO 0.32 cm^2. The total vena contracta area was  0.58 cm^2. There was systolic flow reversal in the pulmonary vein doppler  pattern. No evidence of mitral  stenosis.   7. The tricuspid valve is abnormal. Tricuspid valve regurgitation is  moderate.   8. The aortic valve tips appeared thickened and domed. The aortic valve  is tricuspid. Aortic valve regurgitation is moderate. No aortic stenosis  is present.   9. The inferior vena cava is normal in size with greater than 50%  respiratory variability, suggesting right atrial pressure of 3 mmHg.  CATH 02/28/22 Conclusion    Prox RCA lesion is 10% stenosed.   LV end diastolic pressure is moderately elevated.   There is no aortic valve stenosis.   No angiographically apparent left sided coronary artery disease.   Hemodynamic findings consistent with moderate pulmonary hypertension.   Aortic saturation 83%, PA saturation 48%, mean RA pressure 18 mmHg, PA pressure 50/28, mean PA pressure 37 mmHg, pulmonary capillary wedge pressure 35/36, mean pulmonary capillary wedge pressure 30 mmHg, cardiac output 3.92 L/min, cardiac index 2.27.    TEE (02/2022) IMPRESSIONS   1. Left ventricular ejection fraction, by estimation, is 30 to 35%. The  left ventricle has moderately decreased function. The left ventricle  demonstrates global  hypokinesis. The left ventricular internal cavity size  was mildly dilated. There is mild  concentric left ventricular hypertrophy.   2. Right ventricular systolic function is mildly reduced. The right  ventricular size is mildly enlarged. There is moderately elevated  pulmonary artery systolic pressure. The estimated right ventricular  systolic pressure is Q000111Q mmHg.   3. Left atrial size was severely dilated. No left atrial/left atrial  appendage thrombus was detected.   4. Right atrial size was mildly dilated.   5. No ASD or PFO by color doppler.   6. The mitral valve is abnormal. Severe mitral valve regurgitation. The  mitral valve is thickened at the leaflet tips with poor coaptation. There  is restriction of the posterior leaflet. 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 There are 2 main jets of mitral regurgitation. PISA  measured for one jet, ERO 0.32 cm^2. The total vena contracta area was  0.58 cm^2. There was systolic flow reversal in the pulmonary vein doppler  pattern. No evidence of mitral  stenosis.   7. The tricuspid valve is abnormal. Tricuspid valve regurgitation is  moderate.   8. The aortic valve tips appeared thickened and domed. The aortic valve  is tricuspid. Aortic valve regurgitation is moderate. No aortic stenosis  is present.   9. The inferior vena cava is normal in size with greater than 50%  respiratory variability, suggesting right atrial pressure of 3 mmHg.      Past Medical History:  Diagnosis Date   Anxiety    Arthritis    "right knee" (05/26/2014)   Asthma    CHF (congestive heart failure) (HCC)    chronic diastolic heart failure - sees Dr. Aundra Dubin   Chronic lower back pain    Dysrhythmia    irregular heart beat   GERD (gastroesophageal reflux disease)    HLD (hyperlipidemia)    Hypertension    Pneumonia    Stroke [facility])    TIA - ?2021   TIA (transient ischemic attack) 04/2018    Past Surgical  History:  Procedure Laterality Date   BRONCHIAL BIOPSY  07/10/2021   Procedure: BRONCHIAL BIOPSIES;  Surgeon: Garner Nash, DO;  Location: [facility];  Service: Pulmonary;;   BRONCHIAL BRUSHINGS  07/10/2021   Procedure: BRONCHIAL BRUSHINGS;  Surgeon: Garner Nash, DO;  Location: [facility];  Service: Pulmonary;;   BRONCHIAL NEEDLE ASPIRATION BIOPSY  07/10/2021   Procedure: BRONCHIAL NEEDLE ASPIRATION BIOPSIES;  Surgeon: Garner Nash, DO;  Location: [place];  Service: Pulmonary;;   BRONCHIAL NEEDLE ASPIRATION BIOPSY  04/16/2022   Procedure: BRONCHIAL NEEDLE ASPIRATION BIOPSIES;  Surgeon: Garner Nash, DO;  Location: [place];  Service: Cardiopulmonary;;   BRONCHIAL WASHINGS  07/10/2021   Procedure: BRONCHIAL WASHINGS;  Surgeon: Valeta Harms,  Octavio Graves, DO;  Location: [facility];  Service: Pulmonary;;   COLONOSCOPY WITH PROPOFOL N/A 03/03/2022   Procedure: COLONOSCOPY WITH PROPOFOL;  Surgeon: Carol Ada, MD;  Location: [facility];  Service: Gastroenterology;  Laterality: N/A;   ESOPHAGOGASTRODUODENOSCOPY N/A 05/27/2014   Procedure: ESOPHAGOGASTRODUODENOSCOPY (EGD);  Surgeon: Carol Ada, MD;  Location: [facility] ENDOSCOPY;  Service: Endoscopy;  Laterality: N/A;   ESOPHAGOGASTRODUODENOSCOPY (EGD) WITH PROPOFOL N/A 03/03/2022   Procedure: ESOPHAGOGASTRODUODENOSCOPY (EGD) WITH PROPOFOL;  Surgeon: Carol Ada, MD;  Location: [place];  Service: Gastroenterology;  Laterality: N/A;   FRACTURE SURGERY     PATELLA FRACTURE SURGERY Right ~ 2009   RIGHT/LEFT HEART CATH AND CORONARY ANGIOGRAPHY N/A 02/28/2022   Procedure: RIGHT/LEFT HEART CATH AND CORONARY ANGIOGRAPHY;  Surgeon: Jettie Booze, MD;  Location: [facility];  Service: Cardiovascular;  Laterality: N/A;   SAVORY DILATION N/A 03/03/2022   Procedure: SAVORY DILATION;  Surgeon: Carol Ada, MD;  Location: [place];  Service: Gastroenterology;  Laterality: N/A;   TEE WITHOUT CARDIOVERSION N/A 03/01/2022    Procedure: TRANSESOPHAGEAL ECHOCARDIOGRAM (TEE);  Surgeon: Larey Dresser, MD;  Location: [facility] ENDOSCOPY;  Service: Cardiovascular;  Laterality: N/A;   TONSILLECTOMY  ~ [place]  1980's   [place] Bilateral 04/16/2022   Procedure: VIDEO BRONCHOSCOPY WITH ENDOBRONCHIAL ULTRASOUND;  Surgeon: Garner Nash, DO;  Location: [place];  Service: Cardiopulmonary;  Laterality: Bilateral;   VIDEO BRONCHOSCOPY WITH RADIAL ENDOBRONCHIAL ULTRASOUND  07/10/2021   Procedure: RADIAL ENDOBRONCHIAL ULTRASOUND;  Surgeon: Garner Nash, DO;  Location: [facility];  Service: Pulmonary;;    MEDICATIONS:  dapagliflozin propanediol (FARXIGA) 10 MG TABS tablet   albuterol (PROVENTIL HFA;VENTOLIN HFA) 108 (90 BASE) MCG/ACT inhaler   albuterol (PROVENTIL) (2.5 MG/3ML) 0.083% nebulizer solution   atorvastatin (LIPITOR) 80 MG tablet   busPIRone (BUSPAR) 10 MG tablet   carvedilol (COREG) 3.125 MG tablet   Cholecalciferol (VITAMIN D) 50 MCG (2000 UT) tablet   clopidogrel (PLAVIX) 75 MG tablet   desloratadine (CLARINEX) 5 MG tablet   ezetimibe (ZETIA) 10 MG tablet   FLUoxetine (PROZAC) 20 MG capsule   fluticasone (FLONASE) 50 MCG/ACT nasal spray   fluticasone-salmeterol (WIXELA INHUB) 500-50 MCG/ACT AEPB   losartan (COZAAR) 25 MG tablet   metoprolol succinate (TOPROL XL) 25 MG 24 hr tablet   montelukast (SINGULAIR) 10 MG tablet   pantoprazole (PROTONIX) 40 MG tablet   potassium chloride SA (KLOR-CON M) 20 MEQ tablet   spironolactone (ALDACTONE) 25 MG tablet   torsemide (DEMADEX) 20 MG tablet   No current facility-administered medications for this encounter.    Myra Gianotti, PA-C Surgical Short Stay/Anesthesiology [facility] Phone [phone] [facility] Phone [phone] 05/06/2022 1:13 PM

## 2022-05-06 NOTE — H&P (Signed)
[address]       [place] [address]             [phone]                                   Jonella A Mccaughey [place] Medical Record [id number] Date of Birth: Jul 19, 1950   Larey Dresser, MD Demetrios Isaacs, FNP   Chief Complaint: Mitral and aortic regurgitation     History of Present Illness:     Pt is a 72 yo female who had a normal echo about 2 years ago. 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 Pt had an EF of 35%. Pt was sent for cath and found to have no CAD but elevated filling pressures and was admitted and diuresed. TEE and MRI performed have documented ongoing Cardiolmyopathy with MRI having EF of 18% and concern for severe AI and ongoing MR. Pt was felt secondary to her mitral pathology to not have a clip option. She has recently had bronchoscopy of mediastinal adenopathy without cancer and no mention of sarcoidosis. She has had esophageal dilation with improvement in her ability to swallow. Pt has had TIA of Left arm paresis and numbness that lasted for a week and resolved and has been on plavix without recurrence. Pt here for surgical consideration. She is overall doing well symptomatically with occasional DOE. No longer with lower ext edema. She reports her weight varies              Past Medical History:  Diagnosis Date   Anxiety     Arthritis      "right knee" (05/26/2014)   Asthma     CHF (congestive heart failure) (HCC)      chronic diastolic heart failure - sees Dr. Aundra Dubin   Chronic lower back pain     Dysrhythmia      irregular heart beat   GERD (gastroesophageal reflux disease)     HLD (hyperlipidemia)     Hypertension     Pneumonia     Stroke [facility])      TIA - ?2021   TIA (transient ischemic attack) 04/2018           Past Surgical History:  Procedure  Laterality Date   BRONCHIAL BIOPSY   07/10/2021    Procedure: BRONCHIAL BIOPSIES;  Surgeon: Garner Nash, DO;  Location: [place];  Service: Pulmonary;;   BRONCHIAL BRUSHINGS   07/10/2021    Procedure: BRONCHIAL BRUSHINGS;  Surgeon: Garner Nash, DO;  Location: [place] [facility];  Service: Pulmonary;;   BRONCHIAL NEEDLE ASPIRATION BIOPSY   07/10/2021    Procedure: BRONCHIAL NEEDLE ASPIRATION BIOPSIES;  Surgeon: Garner Nash, DO;  Location: [place];  Service: Pulmonary;;   BRONCHIAL NEEDLE ASPIRATION BIOPSY   04/16/2022    Procedure: BRONCHIAL NEEDLE ASPIRATION BIOPSIES;  Surgeon: Garner Nash, DO;  Location: [place];  Service: Cardiopulmonary;;   BRONCHIAL WASHINGS   07/10/2021    Procedure: BRONCHIAL WASHINGS;  Surgeon: Garner Nash, DO;  Location: [place] [facility];  Service: Pulmonary;;   COLONOSCOPY WITH PROPOFOL N/A 03/03/2022    Procedure: COLONOSCOPY WITH PROPOFOL;  Surgeon: Carol Ada, MD;  Location: [place];  Service:  Gastroenterology;  Laterality: N/A;   ESOPHAGOGASTRODUODENOSCOPY N/A 05/27/2014    Procedure: ESOPHAGOGASTRODUODENOSCOPY (EGD);  Surgeon: Carol Ada, MD;  Location: [facility] ENDOSCOPY;  Service: Endoscopy;  Laterality: N/A;   ESOPHAGOGASTRODUODENOSCOPY (EGD) WITH PROPOFOL N/A 03/03/2022    Procedure: ESOPHAGOGASTRODUODENOSCOPY (EGD) WITH PROPOFOL;  Surgeon: Carol Ada, MD;  Location: [place];  Service: Gastroenterology;  Laterality: N/A;   FRACTURE SURGERY       PATELLA FRACTURE SURGERY Right ~ 2009   RIGHT/LEFT HEART CATH AND CORONARY ANGIOGRAPHY N/A 02/28/2022    Procedure: RIGHT/LEFT HEART CATH AND CORONARY ANGIOGRAPHY;  Surgeon: Jettie Booze, MD;  Location: [place] [facility];  Service: Cardiovascular;  Laterality: N/A;   SAVORY DILATION N/A 03/03/2022    Procedure: SAVORY DILATION;  Surgeon: Carol Ada, MD;  Location: [place];  Service: Gastroenterology;  Laterality: N/A;   TEE WITHOUT CARDIOVERSION N/A 03/01/2022     Procedure: TRANSESOPHAGEAL ECHOCARDIOGRAM (TEE);  Surgeon: Larey Dresser, MD;  Location: [facility] ENDOSCOPY;  Service: Cardiovascular;  Laterality: N/A;   TONSILLECTOMY   ~ [place]   1980's   [place] Bilateral 04/16/2022    Procedure: VIDEO BRONCHOSCOPY WITH ENDOBRONCHIAL ULTRASOUND;  Surgeon: Garner Nash, DO;  Location: [place];  Service: Cardiopulmonary;  Laterality: Bilateral;   VIDEO BRONCHOSCOPY WITH RADIAL ENDOBRONCHIAL ULTRASOUND   07/10/2021    Procedure: RADIAL ENDOBRONCHIAL ULTRASOUND;  Surgeon: Garner Nash, DO;  Location: [facility];  Service: Pulmonary;;      Social History        Tobacco Use  Smoking Status Former   Packs/day: 0.50   Years: 30.00   Total pack years: 15.00   Types: Cigarettes   Quit date: 11/10/1998   Years since quitting: 23.4  Smokeless Tobacco Never    Social History        Substance and Sexual Activity  Alcohol Use Yes    Comment: occasional      Social History         Socioeconomic History   Marital status: Widowed      Spouse name: Not on file   Number of children: Not on file   Years of education: Not on file   Highest education level: Not on file  Occupational History   Not on file  Tobacco Use   Smoking status: Former      Packs/day: 0.50      Years: 30.00      Total pack years: 15.00      Types: Cigarettes      Quit date: 11/10/1998      Years since quitting: 23.4   Smokeless tobacco: Never  Vaping Use   Vaping Use: Never used  Substance and Sexual Activity   Alcohol use: Yes      Comment: occasional   Drug use: No   Sexual activity: Not Currently  Other Topics Concern   Not on file  Social History Narrative   Not on file    Social Determinants of Health        Financial Resource Strain: Not on file  Food Insecurity: No Food Insecurity (02/28/2022)    Hunger Vital Sign     Worried About Running Out of Food in the Last Year: Never true      Ran Out of Food in the Last Year: Never true  Transportation Needs: No Transportation Needs (02/28/2022)    PRAPARE - Transportation     Lack of Transportation (Medical): No     Lack  of Transportation (Non-Medical): No  Physical Activity: Not on file  Stress: Not on file  Social Connections: Not on file  Intimate Partner Violence: Not At Risk (02/28/2022)    Humiliation, Afraid, Rape, and Kick questionnaire     Fear of Current or Ex-Partner: No     Emotionally Abused: No     Physically Abused: No     Sexually Abused: No           Allergies  Allergen Reactions   Amoxicillin-Pot Clavulanate Itching   Other Other (See Comments)      08/06/2018 -    06/05/2016 -    09/15/2014 -    06/16/2014 -            Current Outpatient Medications  Medication Sig Dispense Refill   albuterol (PROVENTIL HFA;VENTOLIN HFA) 108 (90 BASE) MCG/ACT inhaler Inhale 2 puffs into the lungs every 2 (two) hours as needed for wheezing or shortness of breath (cough). 1 Inhaler 0   albuterol (PROVENTIL) (2.5 MG/3ML) 0.083% nebulizer solution Take 3 mLs (2.5 mg total) by nebulization every 6 (six) hours as needed for wheezing or shortness of breath. 75 mL 12   atorvastatin (LIPITOR) 80 MG tablet Take 1 tablet (80 mg total) by mouth daily at 6 PM. 30 tablet 2   busPIRone (BUSPAR) 10 MG tablet Take 10 mg by mouth 2 (two) times daily.       Cholecalciferol (VITAMIN D) 50 MCG (2000 UT) tablet Take 2,000 Units by mouth daily.       clopidogrel (PLAVIX) 75 MG tablet Take 1 tablet (75 mg total) by mouth daily. 30 tablet 0   dapagliflozin propanediol (FARXIGA) 10 MG TABS tablet Take 1 tablet (10 mg total) by mouth daily. 30 tablet 5   desloratadine (CLARINEX) 5 MG tablet Take 5 mg by mouth daily.       ezetimibe (ZETIA) 10 MG tablet Take 10 mg by mouth daily.       FLUoxetine (PROZAC) 20 MG capsule Take 20 mg by mouth daily.       fluticasone (FLONASE) 50 MCG/ACT nasal spray Place 1 spray into both nostrils daily.        fluticasone-salmeterol (WIXELA INHUB) 500-50 MCG/ACT AEPB Inhale 1 puff into the lungs in the morning and at bedtime. 60 each 6   losartan (COZAAR) 25 MG tablet Take 1/2 tablet (12.5 mg total) by mouth daily. 30 tablet 3   metoprolol succinate (TOPROL XL) 25 MG 24 hr tablet Take 0.5 tablets (12.5 mg total) by mouth at bedtime. 15 tablet 8   montelukast (SINGULAIR) 10 MG tablet Take 1 tablet (10 mg total) by mouth at bedtime. 30 tablet 11   pantoprazole (PROTONIX) 40 MG tablet Take 1 tablet (40 mg total) by mouth daily. 90 tablet 1   potassium chloride SA (KLOR-CON M) 20 MEQ tablet Take 1 tablet (20 mEq total) by mouth every other day on the same days that you take torsemide. 15 tablet 11   spironolactone (ALDACTONE) 25 MG tablet Take 1/2 tablet (12.5 mg total) by mouth daily. 30 tablet 3   torsemide (DEMADEX) 20 MG tablet Take 0.5 tablets (10 mg total) by mouth every other day. 30 tablet 3    No current facility-administered medications for this visit.             Family History  Problem Relation Age of Onset   Hypertension Mother     Hypertension Father     Colon cancer Father  Hypertension Sister              Physical Exam: Teeth in good repair Neuro: no focal deficits Lungs: clear Card: RR with soft systolic and diastolic murmurs Ext: no edema         Diagnostic Studies & Laboratory data: I have personally reviewed the following studies and agree with the findings    TTE (1/20204) IMPRESSIONS     1. Left ventricular ejection fraction, by estimation, is 40 to 45%. The  left ventricle has mildly decreased function. The left ventricle  demonstrates global hypokinesis. Indeterminate diastolic filling due to  E-A fusion.   2. Right ventricular systolic function is normal. The right ventricular  size is mildly enlarged. There is severely elevated pulmonary artery  systolic pressure.   3. Left atrial size was severely dilated.   4. Right atrial size was mild to  moderately dilated.   5. EROA 0.37 cm2. The mitral valve is degenerative. Moderate to severe  mitral valve regurgitation.   6. Tricuspid valve regurgitation is moderate to severe.   7. Aortic valve regurgitation is mild. Aortic valve  sclerosis/calcification is present, without any evidence of aortic  stenosis.   8. There is mild dilatation of the ascending aorta, measuring 36 mm.   9. The inferior vena cava is normal in size with greater than 50%  respiratory variability, suggesting right atrial pressure of 3 mmHg.   Conclusion(s)/Recommendation(s): Compared to prior study EF has declined.  MR/TR has worsened. Consider ischemic evaluation, diuresis, TEE.    CATH (02/2022) Conclusion       Prox RCA lesion is 10% stenosed.   LV end diastolic pressure is moderately elevated.   There is no aortic valve stenosis.   No angiographically apparent left sided coronary artery disease.   Hemodynamic findings consistent with moderate pulmonary hypertension.   Aortic saturation 83%, PA saturation 48%, mean RA pressure 18 mmHg, PA pressure 50/28, mean PA pressure 37 mmHg, pulmonary capillary wedge pressure 35/36, mean pulmonary capillary wedge pressure 30 mmHg, cardiac output 3.92 L/min, cardiac index 2.27.   TEE (02/2022) IMPRESSIONS     1. Left ventricular ejection fraction, by estimation, is 30 to 35%. The  left ventricle has moderately decreased function. The left ventricle  demonstrates global hypokinesis. The left ventricular internal cavity size  was mildly dilated. There is mild  concentric left ventricular hypertrophy.   2. Right ventricular systolic function is mildly reduced. The right  ventricular size is mildly enlarged. There is moderately elevated  pulmonary artery systolic pressure. The estimated right ventricular  systolic pressure is Q000111Q mmHg.   3. Left atrial size was severely dilated. No left atrial/left atrial  appendage thrombus was detected.   4. Right atrial size was  mildly dilated.   5. No ASD or PFO by color doppler.   6. The mitral valve is abnormal. Severe mitral valve regurgitation. The  mitral valve is thickened at the leaflet tips with poor coaptation. There  is restriction of the posterior leaflet. 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 There are 2 main jets of mitral regurgitation. PISA  measured for one jet, ERO 0.32 cm^2. The total vena contracta area was  0.58 cm^2. There was systolic flow reversal in the pulmonary vein doppler  pattern. No evidence of mitral  stenosis.   7. The tricuspid valve is abnormal. Tricuspid valve regurgitation is  moderate.   8. The aortic valve tips appeared thickened and domed. The aortic  valve  is tricuspid. Aortic valve regurgitation is moderate. No aortic stenosis  is present.   9. The inferior vena cava is normal in size with greater than 50%  respiratory variability, suggesting right atrial pressure of 3 mmHg.   FINDINGS   Left Ventricle: Left ventricular ejection fraction, by estimation, is 30  to 35%. The left ventricle has moderately decreased function. The left  ventricle demonstrates global hypokinesis. The left ventricular internal  cavity size was mildly dilated.  There is mild concentric left ventricular hypertrophy.   Right Ventricle: The right ventricular size is mildly enlarged. No  increase in right ventricular wall thickness. Right ventricular systolic  function is mildly reduced. There is moderately elevated pulmonary artery  systolic pressure. The tricuspid  regurgitant velocity is 3.51 m/s, and with an assumed right atrial  pressure of 3 mmHg, the estimated right ventricular systolic pressure is  Q000111Q mmHg.   Left Atrium: Left atrial size was severely dilated. No left atrial/left  atrial appendage thrombus was detected.   Right Atrium: Right atrial size was mildly dilated.   Pericardium: Trivial pericardial effusion is present.    Mitral Valve: The mitral valve is abnormal. Severe mitral valve  regurgitation. No evidence of mitral valve stenosis. MV peak gradient, 4.0  mmHg. The mean mitral valve gradient is 2.0 mmHg.   Tricuspid Valve: The tricuspid valve is abnormal. Tricuspid valve  regurgitation is moderate.   Aortic Valve: The aortic valve tips appeared thickened and domed. The  aortic valve is tricuspid. Aortic valve regurgitation is moderate. No  aortic stenosis is present.   Pulmonic Valve: The pulmonic valve was normal in structure. Pulmonic valve  regurgitation is trivial.   Aorta: The aortic root is normal in size and structure.   Venous: The inferior vena cava is normal in size with greater than 50%  respiratory variability, suggesting right atrial pressure of 3 mmHg.   IAS/Shunts: No ASD or PFO by color doppler.   Additional Comments: Spectral Doppler performed.   LEFT VENTRICLE  PLAX 2D  LVIDd:         5.60 cm  LVIDs:         4.80 cm  LV PW:         1.30 cm  LV IVS:        0.80 cm  LVOT diam:     2.10 cm  LVOT Area:     3.46 cm     IVC  IVC diam: 1.00 cm   MITRAL VALVE                  TRICUSPID VALVE  MV Peak grad: 4.0 mmHg        TR Peak grad:   49.3 mmHg  MV Mean grad: 2.0 mmHg        TR Vmax:        351.00 cm/s  MV Vmax:      1.00 m/s  MV Vmean:     58.9 cm/s       SHUNTS  MR Peak grad:    86.5 mmHg    Systemic Diam: 2.10 cm  MR Mean grad:    46.0 mmHg  MR Vmax:         465.00 cm/s  MR Vmean:        307.0 cm/s  MR PISA:         5.09 cm  MR PISA Eff ROA: 42 mm  MR PISA Radius:  0.90 cm    MRI (  02/2022) FINDINGS: Limited images of the lung fields showed increased interstitial markings at bases and mediastinal adenopathy.   Normal left ventricular size and wall thickness, diffuse hypokinesis with EF 18%. Normal right ventricular size with EF 25%. Moderate left atrial enlargement. Mild right atrial enlargement. Trileaflet aortic valve, the valve leaflet tips are  thickened, moderate aortic insufficiency visually but severe by regurgitant fraction (49%). Thickened mitral leaflet tips. There is at least moderate and possibly severe mitral regurgitation. I do not think that calculated regurgitant fraction is going to be accurate given low stroke volume and high risk for error with MR regurgitant fraction calculation.   On delayed enhancement imaging, there was mid-wall late gadolinium enhancement (LGE) in the basal septal wall, possibly in the basal lateral wall.   Measurements: LVEDV 149 mL LVEDVi 87 mL/m2   LVSV 27 mL LVEF 18%   RVEDV 130 mL   RVEDVi 76 mL/m2   RVSV 32 mL RVEF 25%   Aortic forward volume 19 mL   Aortic regurgitant fraction 49%   T1 1014, ECV 37%   IMPRESSION: 1.  Normal LV size with diffuse hypokinesis, EF 18%.   2.  Normal RV size with EF 25%.   3. Aortic insufficiency looks at least moderate, regurgitant fraction 49% suggests severe aortic insufficiency. Thickened leaflet tips.   4. Thickened mitral valve leaflet tips. At least moderate and probably severe mitral regurgitation. Low stroke volume introduces error to the regurgitant fraction calculation.   5. Mid wall LGE in the basal septal and possible subtle LGE in the basal lateral wall. This is not a CAD pattern, possible prior myocarditis. Cannot fully rule out cardiac sarcoidosis involvement given mediastinal lymphadenopathy.   6. Elevated extracellular volume percentage suggests increased fibrotic content in LV myocardium.    Recent Radiology Findings:   CT Chest (12/2021)  FINDINGS: Cardiovascular: 3 Coronary artery calcification and aortic atherosclerotic calcification.   Mediastinum/Nodes: Enlarged RIGHT lower paratracheal lymph node measuring 19 mm short axis compares to 11 mm. High RIGHT paratracheal node measuring 14 mm (image 35/2) compares to 11 mm.   RIGHT hilar lymph node measures 13 mm.   Lungs/Pleura: Interval resolution of  the LEFT upper lobe nodule of concern. No LEFT lung nodules.   There is dense consolidation with air bronchograms within the lateral segment of the RIGHT lower lobe. This large region of segmental consolidation measuring up to 6.2 cm. There is ground-glass density and atelectasis in the posterior RIGHT upper lobe. There is small layering pleural fluid with greater volume of fluid in the upper lobes.   Upper Abdomen: Limited view of the liver, kidneys, pancreas are unremarkable. Normal adrenal glands.   Musculoskeletal: No aggressive osseous lesion.   IMPRESSION: 1. New segmental consolidation with air bronchograms in the RIGHT lower lobe is concerning for pneumonia. Recommend clinical correlation for pulmonary infection. 2. New RIGHT paratracheal adenopathy with differential including reactive adenopathy related to RIGHT lower lobe pneumonia versus metastatic adenopathy. The nodes are larger than typically present with reactive adenopathy. Patient may benefit from an FDG PET scan if no signs of current pulmonary infection. Regardless patient warts follow-up CT scan to demonstrate resolution pneumonia and adenopathy. 3. Resolution of the LEFT upper lobe nodule of concern. 4. Mild atelectasis and effusion in the posterior RIGHT upper lobe.       Recent Lab Findings: Recent Labs       Lab Results  Component Value Date    WBC 4.2 04/16/2022    HGB 12.5 04/16/2022  HCT 37.7 04/16/2022    PLT 238 04/16/2022    GLUCOSE 115 (H) 04/15/2022    CHOL 193 06/17/2019    TRIG 71 06/17/2019    HDL 71 06/17/2019    LDLCALC 109 (H) 06/17/2019    ALT 44 03/18/2022    AST 41 03/18/2022    NA 137 04/15/2022    K 3.6 04/15/2022    CL 100 04/15/2022    CREATININE 1.35 (H) 04/15/2022    BUN 31 (H) 04/15/2022    CO2 26 04/15/2022    TSH 0.795 06/17/2019    INR 1.0 06/19/2021    HGBA1C 5.8 (H) 06/17/2019            Assessment / Plan:     72 yo female with NYHA class 2 symptoms  of severe MR and moderate AI and TR. Pt with nonischemic cardiomyopathy with EF from 18-45% by multiple studies. Pt without diagnosis of sarcoidosis or ca from mediastinal work up and valvular pathology appears rheumatic. I feel that with pt's cardiomyopathy her risk with AVR/MVR/TV repair with mortality 5% and with possible need for inotropic or mechanical support post op. She will need to be off her plavix for 7 days and we have gone over all the goals and risks of surgery and she wishes to proceed.

## 2022-05-07 ENCOUNTER — Inpatient Hospital Stay (HOSPITAL_COMMUNITY): Payer: Medicare HMO

## 2022-05-07 ENCOUNTER — Inpatient Hospital Stay (HOSPITAL_COMMUNITY): Payer: Medicare HMO | Admitting: Vascular Surgery

## 2022-05-07 ENCOUNTER — Other Ambulatory Visit: Payer: Self-pay

## 2022-05-07 ENCOUNTER — Inpatient Hospital Stay (HOSPITAL_COMMUNITY)
Admission: RE | Admit: 2022-05-07 | Discharge: 2022-05-20 | DRG: 219 | Disposition: A | Discharge status: Home Health | Payer: Medicare HMO | Source: Ambulatory Visit | Attending: Thoracic Surgery (Cardiothoracic Vascular Surgery) | Admitting: Thoracic Surgery (Cardiothoracic Vascular Surgery)

## 2022-05-07 ENCOUNTER — Encounter (HOSPITAL_COMMUNITY)
Admission: RE | Disposition: A | Discharge status: Home Health | Payer: Self-pay | Source: Ambulatory Visit | Attending: Thoracic Surgery (Cardiothoracic Vascular Surgery)

## 2022-05-07 ENCOUNTER — Ambulatory Visit: Payer: Medicare HMO | Admitting: Pulmonary Disease

## 2022-05-07 ENCOUNTER — Encounter (HOSPITAL_COMMUNITY): Payer: Self-pay | Admitting: Thoracic Surgery (Cardiothoracic Vascular Surgery)

## 2022-05-07 ENCOUNTER — Inpatient Hospital Stay (HOSPITAL_COMMUNITY): Payer: Medicare HMO | Admitting: Anesthesiology

## 2022-05-07 DIAGNOSIS — R54 Age-related physical debility: Secondary | ICD-10-CM | POA: Diagnosis present

## 2022-05-07 DIAGNOSIS — R5381 Other malaise: Secondary | ICD-10-CM | POA: Diagnosis not present

## 2022-05-07 DIAGNOSIS — I471 Supraventricular tachycardia, unspecified: Secondary | ICD-10-CM | POA: Diagnosis not present

## 2022-05-07 DIAGNOSIS — Z8249 Family history of ischemic heart disease and other diseases of the circulatory system: Secondary | ICD-10-CM

## 2022-05-07 DIAGNOSIS — D62 Acute posthemorrhagic anemia: Secondary | ICD-10-CM | POA: Diagnosis not present

## 2022-05-07 DIAGNOSIS — D689 Coagulation defect, unspecified: Secondary | ICD-10-CM | POA: Diagnosis not present

## 2022-05-07 DIAGNOSIS — Z7901 Long term (current) use of anticoagulants: Secondary | ICD-10-CM

## 2022-05-07 DIAGNOSIS — I130 Hypertensive heart and chronic kidney disease with heart failure and stage 1 through stage 4 chronic kidney disease, or unspecified chronic kidney disease: Secondary | ICD-10-CM | POA: Diagnosis present

## 2022-05-07 DIAGNOSIS — Z87891 Personal history of nicotine dependence: Secondary | ICD-10-CM

## 2022-05-07 DIAGNOSIS — M109 Gout, unspecified: Secondary | ICD-10-CM | POA: Diagnosis present

## 2022-05-07 DIAGNOSIS — I2510 Atherosclerotic heart disease of native coronary artery without angina pectoris: Secondary | ICD-10-CM | POA: Diagnosis present

## 2022-05-07 DIAGNOSIS — I272 Pulmonary hypertension, unspecified: Secondary | ICD-10-CM | POA: Diagnosis present

## 2022-05-07 DIAGNOSIS — I083 Combined rheumatic disorders of mitral, aortic and tricuspid valves: Secondary | ICD-10-CM

## 2022-05-07 DIAGNOSIS — J9811 Atelectasis: Secondary | ICD-10-CM | POA: Diagnosis present

## 2022-05-07 DIAGNOSIS — I350 Nonrheumatic aortic (valve) stenosis: Secondary | ICD-10-CM | POA: Diagnosis not present

## 2022-05-07 DIAGNOSIS — E876 Hypokalemia: Secondary | ICD-10-CM | POA: Diagnosis not present

## 2022-05-07 DIAGNOSIS — D696 Thrombocytopenia, unspecified: Secondary | ICD-10-CM | POA: Diagnosis not present

## 2022-05-07 DIAGNOSIS — J439 Emphysema, unspecified: Secondary | ICD-10-CM | POA: Diagnosis present

## 2022-05-07 DIAGNOSIS — F419 Anxiety disorder, unspecified: Secondary | ICD-10-CM | POA: Diagnosis present

## 2022-05-07 DIAGNOSIS — E871 Hypo-osmolality and hyponatremia: Secondary | ICD-10-CM | POA: Diagnosis present

## 2022-05-07 DIAGNOSIS — Z880 Allergy status to penicillin: Secondary | ICD-10-CM

## 2022-05-07 DIAGNOSIS — I509 Heart failure, unspecified: Secondary | ICD-10-CM | POA: Diagnosis not present

## 2022-05-07 DIAGNOSIS — N179 Acute kidney failure, unspecified: Secondary | ICD-10-CM | POA: Diagnosis not present

## 2022-05-07 DIAGNOSIS — Z8673 Personal history of transient ischemic attack (TIA), and cerebral infarction without residual deficits: Secondary | ICD-10-CM

## 2022-05-07 DIAGNOSIS — I5043 Acute on chronic combined systolic (congestive) and diastolic (congestive) heart failure: Secondary | ICD-10-CM | POA: Diagnosis not present

## 2022-05-07 DIAGNOSIS — N1831 Chronic kidney disease, stage 3a: Secondary | ICD-10-CM | POA: Diagnosis present

## 2022-05-07 DIAGNOSIS — D6832 Hemorrhagic disorder due to extrinsic circulating anticoagulants: Secondary | ICD-10-CM | POA: Diagnosis not present

## 2022-05-07 DIAGNOSIS — I480 Paroxysmal atrial fibrillation: Secondary | ICD-10-CM | POA: Diagnosis present

## 2022-05-07 DIAGNOSIS — Z952 Presence of prosthetic heart valve: Principal | ICD-10-CM

## 2022-05-07 DIAGNOSIS — I340 Nonrheumatic mitral (valve) insufficiency: Secondary | ICD-10-CM | POA: Diagnosis not present

## 2022-05-07 DIAGNOSIS — Z79899 Other long term (current) drug therapy: Secondary | ICD-10-CM | POA: Diagnosis not present

## 2022-05-07 DIAGNOSIS — R739 Hyperglycemia, unspecified: Secondary | ICD-10-CM | POA: Diagnosis not present

## 2022-05-07 DIAGNOSIS — J45909 Unspecified asthma, uncomplicated: Secondary | ICD-10-CM | POA: Diagnosis present

## 2022-05-07 DIAGNOSIS — G8324 Monoplegia of upper limb affecting left nondominant side: Secondary | ICD-10-CM | POA: Diagnosis present

## 2022-05-07 DIAGNOSIS — I5023 Acute on chronic systolic (congestive) heart failure: Secondary | ICD-10-CM | POA: Diagnosis not present

## 2022-05-07 DIAGNOSIS — I428 Other cardiomyopathies: Secondary | ICD-10-CM | POA: Diagnosis present

## 2022-05-07 DIAGNOSIS — Z881 Allergy status to other antibiotic agents status: Secondary | ICD-10-CM

## 2022-05-07 DIAGNOSIS — I351 Nonrheumatic aortic (valve) insufficiency: Secondary | ICD-10-CM

## 2022-05-07 DIAGNOSIS — I493 Ventricular premature depolarization: Secondary | ICD-10-CM | POA: Diagnosis not present

## 2022-05-07 DIAGNOSIS — Z7902 Long term (current) use of antithrombotics/antiplatelets: Secondary | ICD-10-CM

## 2022-05-07 DIAGNOSIS — K219 Gastro-esophageal reflux disease without esophagitis: Secondary | ICD-10-CM | POA: Diagnosis present

## 2022-05-07 DIAGNOSIS — E785 Hyperlipidemia, unspecified: Secondary | ICD-10-CM | POA: Diagnosis present

## 2022-05-07 DIAGNOSIS — I5082 Biventricular heart failure: Secondary | ICD-10-CM | POA: Diagnosis not present

## 2022-05-07 DIAGNOSIS — D649 Anemia, unspecified: Secondary | ICD-10-CM | POA: Diagnosis not present

## 2022-05-07 DIAGNOSIS — Z7982 Long term (current) use of aspirin: Secondary | ICD-10-CM

## 2022-05-07 DIAGNOSIS — I342 Nonrheumatic mitral (valve) stenosis: Secondary | ICD-10-CM | POA: Diagnosis not present

## 2022-05-07 DIAGNOSIS — R570 Cardiogenic shock: Secondary | ICD-10-CM | POA: Diagnosis not present

## 2022-05-07 HISTORY — PX: MITRAL VALVE REPLACEMENT: SHX147

## 2022-05-07 HISTORY — PX: TEE WITHOUT CARDIOVERSION: SHX5443

## 2022-05-07 HISTORY — PX: AORTIC VALVE REPLACEMENT: SHX41

## 2022-05-07 LAB — POCT I-STAT, CHEM 8
BUN: 28 mg/dL — ABNORMAL HIGH (ref 8–23)
BUN: 22 mg/dL (ref 8–23)
BUN: 27 mg/dL — ABNORMAL HIGH (ref 8–23)
BUN: 29 mg/dL — ABNORMAL HIGH (ref 8–23)
BUN: 23 mg/dL (ref 8–23)
Calcium, Ion: 1.27 mmol/L (ref 1.15–1.40)
Calcium, Ion: 0.97 mmol/L — ABNORMAL LOW (ref 1.15–1.40)
Calcium, Ion: 1.18 mmol/L (ref 1.15–1.40)
Calcium, Ion: 1.11 mmol/L — ABNORMAL LOW (ref 1.15–1.40)
Calcium, Ion: 1.02 mmol/L — ABNORMAL LOW (ref 1.15–1.40)
Chloride: 107 mmol/L (ref 98–111)
Chloride: 103 mmol/L (ref 98–111)
Chloride: 107 mmol/L (ref 98–111)
Chloride: 103 mmol/L (ref 98–111)
Chloride: 104 mmol/L (ref 98–111)
Creatinine, Ser: 1.2 mg/dL — ABNORMAL HIGH (ref 0.44–1.00)
Creatinine, Ser: 0.8 mg/dL (ref 0.44–1.00)
Creatinine, Ser: 1.1 mg/dL — ABNORMAL HIGH (ref 0.44–1.00)
Creatinine, Ser: 1 mg/dL (ref 0.44–1.00)
Creatinine, Ser: 0.8 mg/dL (ref 0.44–1.00)
Glucose, Bld: 135 mg/dL — ABNORMAL HIGH (ref 70–99)
Glucose, Bld: 105 mg/dL — ABNORMAL HIGH (ref 70–99)
Glucose, Bld: 131 mg/dL — ABNORMAL HIGH (ref 70–99)
Glucose, Bld: 159 mg/dL — ABNORMAL HIGH (ref 70–99)
Glucose, Bld: 177 mg/dL — ABNORMAL HIGH (ref 70–99)
HCT: 29 % — ABNORMAL LOW (ref 36.0–46.0)
HCT: 19 % — ABNORMAL LOW (ref 36.0–46.0)
HCT: 26 % — ABNORMAL LOW (ref 36.0–46.0)
HCT: 19 % — ABNORMAL LOW (ref 36.0–46.0)
HCT: 21 % — ABNORMAL LOW (ref 36.0–46.0)
Hemoglobin: 9.9 g/dL — ABNORMAL LOW (ref 12.0–15.0)
Hemoglobin: 6.5 g/dL — CL (ref 12.0–15.0)
Hemoglobin: 8.8 g/dL — ABNORMAL LOW (ref 12.0–15.0)
Hemoglobin: 6.5 g/dL — CL (ref 12.0–15.0)
Hemoglobin: 7.1 g/dL — ABNORMAL LOW (ref 12.0–15.0)
Potassium: 3.4 mmol/L — ABNORMAL LOW (ref 3.5–5.1)
Potassium: 3.2 mmol/L — ABNORMAL LOW (ref 3.5–5.1)
Potassium: 3.7 mmol/L (ref 3.5–5.1)
Potassium: 4.3 mmol/L (ref 3.5–5.1)
Potassium: 5.4 mmol/L — ABNORMAL HIGH (ref 3.5–5.1)
Sodium: 141 mmol/L (ref 135–145)
Sodium: 141 mmol/L (ref 135–145)
Sodium: 141 mmol/L (ref 135–145)
Sodium: 139 mmol/L (ref 135–145)
Sodium: 137 mmol/L (ref 135–145)
TCO2: 26 mmol/L (ref 22–32)
TCO2: 19 mmol/L — ABNORMAL LOW (ref 22–32)
TCO2: 23 mmol/L (ref 22–32)
TCO2: 28 mmol/L (ref 22–32)
TCO2: 26 mmol/L (ref 22–32)

## 2022-05-07 LAB — POCT I-STAT 7, (LYTES, BLD GAS, ICA,H+H)
Acid-base deficit: 4 mmol/L — ABNORMAL HIGH (ref 0.0–2.0)
Acid-base deficit: 7 mmol/L — ABNORMAL HIGH (ref 0.0–2.0)
Acid-base deficit: 4 mmol/L — ABNORMAL HIGH (ref 0.0–2.0)
Acid-base deficit: 6 mmol/L — ABNORMAL HIGH (ref 0.0–2.0)
Acid-base deficit: 2 mmol/L (ref 0.0–2.0)
Acid-base deficit: 3 mmol/L — ABNORMAL HIGH (ref 0.0–2.0)
Bicarbonate: 21.8 mmol/L (ref 20.0–28.0)
Bicarbonate: 17.2 mmol/L — ABNORMAL LOW (ref 20.0–28.0)
Bicarbonate: 21.6 mmol/L (ref 20.0–28.0)
Bicarbonate: 19.4 mmol/L — ABNORMAL LOW (ref 20.0–28.0)
Bicarbonate: 22.8 mmol/L (ref 20.0–28.0)
Bicarbonate: 21.9 mmol/L (ref 20.0–28.0)
Calcium, Ion: 1.26 mmol/L (ref 1.15–1.40)
Calcium, Ion: 0.95 mmol/L — ABNORMAL LOW (ref 1.15–1.40)
Calcium, Ion: 1.07 mmol/L — ABNORMAL LOW (ref 1.15–1.40)
Calcium, Ion: 1.1 mmol/L — ABNORMAL LOW (ref 1.15–1.40)
Calcium, Ion: 1.07 mmol/L — ABNORMAL LOW (ref 1.15–1.40)
Calcium, Ion: 1.16 mmol/L (ref 1.15–1.40)
HCT: 31 % — ABNORMAL LOW (ref 36.0–46.0)
HCT: 25 % — ABNORMAL LOW (ref 36.0–46.0)
HCT: 26 % — ABNORMAL LOW (ref 36.0–46.0)
HCT: 30 % — ABNORMAL LOW (ref 36.0–46.0)
HCT: 27 % — ABNORMAL LOW (ref 36.0–46.0)
HCT: 29 % — ABNORMAL LOW (ref 36.0–46.0)
Hemoglobin: 10.5 g/dL — ABNORMAL LOW (ref 12.0–15.0)
Hemoglobin: 8.5 g/dL — ABNORMAL LOW (ref 12.0–15.0)
Hemoglobin: 8.8 g/dL — ABNORMAL LOW (ref 12.0–15.0)
Hemoglobin: 10.2 g/dL — ABNORMAL LOW (ref 12.0–15.0)
Hemoglobin: 9.2 g/dL — ABNORMAL LOW (ref 12.0–15.0)
Hemoglobin: 9.9 g/dL — ABNORMAL LOW (ref 12.0–15.0)
O2 Saturation: 100 %
O2 Saturation: 100 %
O2 Saturation: 98 %
O2 Saturation: 99 %
O2 Saturation: 100 %
O2 Saturation: 100 %
Patient temperature: 37
Patient temperature: 36.6
Patient temperature: 36.6
Potassium: 3.3 mmol/L — ABNORMAL LOW (ref 3.5–5.1)
Potassium: 3.7 mmol/L (ref 3.5–5.1)
Potassium: 4.6 mmol/L (ref 3.5–5.1)
Potassium: 4.5 mmol/L (ref 3.5–5.1)
Potassium: 4.1 mmol/L (ref 3.5–5.1)
Potassium: 3.9 mmol/L (ref 3.5–5.1)
Sodium: 140 mmol/L (ref 135–145)
Sodium: 140 mmol/L (ref 135–145)
Sodium: 138 mmol/L (ref 135–145)
Sodium: 140 mmol/L (ref 135–145)
Sodium: 143 mmol/L (ref 135–145)
Sodium: 143 mmol/L (ref 135–145)
TCO2: 23 mmol/L (ref 22–32)
TCO2: 18 mmol/L — ABNORMAL LOW (ref 22–32)
TCO2: 23 mmol/L (ref 22–32)
TCO2: 21 mmol/L — ABNORMAL LOW (ref 22–32)
TCO2: 24 mmol/L (ref 22–32)
TCO2: 23 mmol/L (ref 22–32)
pCO2 arterial: 43.2 mmHg (ref 32–48)
pCO2 arterial: 29.7 mmHg — ABNORMAL LOW (ref 32–48)
pCO2 arterial: 41.6 mmHg (ref 32–48)
pCO2 arterial: 38.1 mmHg (ref 32–48)
pCO2 arterial: 38.7 mmHg (ref 32–48)
pCO2 arterial: 37.3 mmHg (ref 32–48)
pH, Arterial: 7.312 — ABNORMAL LOW (ref 7.35–7.45)
pH, Arterial: 7.372 (ref 7.35–7.45)
pH, Arterial: 7.324 — ABNORMAL LOW (ref 7.35–7.45)
pH, Arterial: 7.316 — ABNORMAL LOW (ref 7.35–7.45)
pH, Arterial: 7.376 (ref 7.35–7.45)
pH, Arterial: 7.376 (ref 7.35–7.45)
pO2, Arterial: 390 mmHg — ABNORMAL HIGH (ref 83–108)
pO2, Arterial: 333 mmHg — ABNORMAL HIGH (ref 83–108)
pO2, Arterial: 110 mmHg — ABNORMAL HIGH (ref 83–108)
pO2, Arterial: 125 mmHg — ABNORMAL HIGH (ref 83–108)
pO2, Arterial: 191 mmHg — ABNORMAL HIGH (ref 83–108)
pO2, Arterial: 176 mmHg — ABNORMAL HIGH (ref 83–108)

## 2022-05-07 LAB — BASIC METABOLIC PANEL
Anion gap: 6 (ref 5–15)
BUN: 20 mg/dL (ref 8–23)
CO2: 21 mmol/L — ABNORMAL LOW (ref 22–32)
Calcium: 7.9 mg/dL — ABNORMAL LOW (ref 8.9–10.3)
Chloride: 112 mmol/L — ABNORMAL HIGH (ref 98–111)
Creatinine, Ser: 1.05 mg/dL — ABNORMAL HIGH (ref 0.44–1.00)
GFR, Estimated: 56 mL/min — ABNORMAL LOW (ref >60)
Glucose, Bld: 130 mg/dL — ABNORMAL HIGH (ref 70–99)
Potassium: 3.8 mmol/L (ref 3.5–5.1)
Sodium: 139 mmol/L (ref 135–145)

## 2022-05-07 LAB — HEMOGLOBIN AND HEMATOCRIT, BLOOD
HCT: 23.9 % — ABNORMAL LOW (ref 36.0–46.0)
Hemoglobin: 8.1 g/dL — ABNORMAL LOW (ref 12.0–15.0)

## 2022-05-07 LAB — APTT: aPTT: 45 seconds — ABNORMAL HIGH (ref 24–36)

## 2022-05-07 LAB — PREPARE RBC (CROSSMATCH)

## 2022-05-07 LAB — COOXEMETRY PANEL
Carboxyhemoglobin: 2.3 % — ABNORMAL HIGH (ref 0.5–1.5)
Methemoglobin: <0.7 % (ref 0.0–1.5)
O2 Saturation: 65.2 %
Total hemoglobin: 9.3 g/dL — ABNORMAL LOW (ref 12.0–16.0)

## 2022-05-07 LAB — GLUCOSE, CAPILLARY
Glucose-Capillary: 125 mg/dL — ABNORMAL HIGH (ref 70–99)
Glucose-Capillary: 139 mg/dL — ABNORMAL HIGH (ref 70–99)
Glucose-Capillary: 139 mg/dL — ABNORMAL HIGH (ref 70–99)
Glucose-Capillary: 145 mg/dL — ABNORMAL HIGH (ref 70–99)
Glucose-Capillary: 154 mg/dL — ABNORMAL HIGH (ref 70–99)
Glucose-Capillary: 172 mg/dL — ABNORMAL HIGH (ref 70–99)
Glucose-Capillary: 174 mg/dL — ABNORMAL HIGH (ref 70–99)
Glucose-Capillary: 177 mg/dL — ABNORMAL HIGH (ref 70–99)
Glucose-Capillary: 178 mg/dL — ABNORMAL HIGH (ref 70–99)
Glucose-Capillary: 182 mg/dL — ABNORMAL HIGH (ref 70–99)
Glucose-Capillary: 214 mg/dL — ABNORMAL HIGH (ref 70–99)

## 2022-05-07 LAB — PROTIME-INR
INR: 1.6 — ABNORMAL HIGH (ref 0.8–1.2)
Prothrombin Time: 18.6 seconds — ABNORMAL HIGH (ref 11.4–15.2)

## 2022-05-07 LAB — CBC
HCT: 30.9 % — ABNORMAL LOW (ref 36.0–46.0)
HCT: 31.2 % — ABNORMAL LOW (ref 36.0–46.0)
Hemoglobin: 10.7 g/dL — ABNORMAL LOW (ref 12.0–15.0)
Hemoglobin: 10.4 g/dL — ABNORMAL LOW (ref 12.0–15.0)
MCH: 30.9 pg (ref 26.0–34.0)
MCH: 29.7 pg (ref 26.0–34.0)
MCHC: 34.6 g/dL (ref 30.0–36.0)
MCHC: 33.3 g/dL (ref 30.0–36.0)
MCV: 89.3 fL (ref 80.0–100.0)
MCV: 89.1 fL (ref 80.0–100.0)
Platelets: 106 10*3/uL — ABNORMAL LOW (ref 150–400)
Platelets: 154 10*3/uL (ref 150–400)
RBC: 3.46 MIL/uL — ABNORMAL LOW (ref 3.87–5.11)
RBC: 3.5 MIL/uL — ABNORMAL LOW (ref 3.87–5.11)
RDW: 14.7 % (ref 11.5–15.5)
RDW: 14.8 % (ref 11.5–15.5)
WBC: 11.8 10*3/uL — ABNORMAL HIGH (ref 4.0–10.5)
WBC: 12.1 10*3/uL — ABNORMAL HIGH (ref 4.0–10.5)
nRBC: 0 % (ref 0.0–0.2)
nRBC: 0 % (ref 0.0–0.2)

## 2022-05-07 LAB — ECHO INTRAOPERATIVE TEE
AV Mean grad: 5 mmHg
AV Vena cont: 0.34 cm
Height: 66 in
MV Vena cont: 0.5 cm
Weight: 2224 oz

## 2022-05-07 LAB — PLATELET COUNT: Platelets: 112 10*3/uL — ABNORMAL LOW (ref 150–400)

## 2022-05-07 LAB — MAGNESIUM: Magnesium: 3.9 mg/dL — ABNORMAL HIGH (ref 1.7–2.4)

## 2022-05-07 LAB — ABO/RH: ABO/RH(D): O POS

## 2022-05-07 SURGERY — REPLACEMENT, AORTIC VALVE, OPEN
Anesthesia: General | Site: Chest

## 2022-05-07 MED ORDER — 0.9 % SODIUM CHLORIDE (POUR BTL) OPTIME
TOPICAL | Status: DC | PRN
  Administered 2022-05-07: 1000 mL
  Administered 2022-05-07: 5000 mL

## 2022-05-07 MED ORDER — SODIUM CHLORIDE 0.9 % IV SOLN: INTRAVENOUS | Status: DC

## 2022-05-07 MED ORDER — ONDANSETRON HCL 4 MG/2ML IJ SOLN
4.0000 mg | Freq: Four times a day (QID) | INTRAMUSCULAR | Status: DC | PRN
  Administered 2022-05-08 – 2022-05-19 (×4): 4 mg via INTRAVENOUS
  Filled 2022-05-07 (×4): qty 2

## 2022-05-07 MED ORDER — PHENYLEPHRINE HCL-NACL 20-0.9 MG/250ML-% IV SOLN
0.0000 ug/min | INTRAVENOUS | Status: DC
  Administered 2022-05-07: 15 ug/min via INTRAVENOUS
  Filled 2022-05-07: qty 250

## 2022-05-07 MED ORDER — FENTANYL CITRATE (PF) 250 MCG/5ML IJ SOLN
INTRAMUSCULAR | Status: AC
  Filled 2022-05-07: qty 5

## 2022-05-07 MED ORDER — PHENYLEPHRINE 80 MCG/ML (10ML) SYRINGE FOR IV PUSH (FOR BLOOD PRESSURE SUPPORT)
PREFILLED_SYRINGE | INTRAVENOUS | Status: AC
  Filled 2022-05-07: qty 10

## 2022-05-07 MED ORDER — INSULIN REGULAR(HUMAN) IN NACL 100-0.9 UT/100ML-% IV SOLN
INTRAVENOUS | Status: DC
  Administered 2022-05-07: 1.1 [IU]/h via INTRAVENOUS

## 2022-05-07 MED ORDER — ROCURONIUM BROMIDE 10 MG/ML (PF) SYRINGE
PREFILLED_SYRINGE | INTRAVENOUS | Status: AC
  Filled 2022-05-07: qty 10

## 2022-05-07 MED ORDER — ALBUMIN HUMAN 5 % IV SOLN: INTRAVENOUS | Status: DC | PRN

## 2022-05-07 MED ORDER — LACTATED RINGERS IV SOLN: 500.0000 mL | Freq: Once | INTRAVENOUS | Status: DC | PRN

## 2022-05-07 MED ORDER — ALBUMIN HUMAN 5 % IV SOLN
250.0000 mL | INTRAVENOUS | Status: AC | PRN
  Administered 2022-05-07 – 2022-05-08 (×2): 12.5 g via INTRAVENOUS

## 2022-05-07 MED ORDER — EZETIMIBE 10 MG PO TABS
10.0000 mg | ORAL_TABLET | Freq: Every day | ORAL | Status: DC
  Administered 2022-05-08 – 2022-05-20 (×13): 10 mg via ORAL
  Filled 2022-05-07 (×14): qty 1

## 2022-05-07 MED ORDER — PLASMA-LYTE A IV SOLN
INTRAVENOUS | Status: DC | PRN
  Administered 2022-05-07: 500 mL via INTRAVASCULAR

## 2022-05-07 MED ORDER — PROPOFOL 10 MG/ML IV BOLUS
INTRAVENOUS | Status: DC | PRN
  Administered 2022-05-07: 20 mg via INTRAVENOUS

## 2022-05-07 MED ORDER — PANTOPRAZOLE SODIUM 40 MG PO TBEC
40.0000 mg | DELAYED_RELEASE_TABLET | Freq: Every day | ORAL | Status: DC
  Administered 2022-05-09 – 2022-05-20 (×12): 40 mg via ORAL
  Filled 2022-05-07 (×13): qty 1

## 2022-05-07 MED ORDER — EPHEDRINE 5 MG/ML INJ
INTRAVENOUS | Status: AC
  Filled 2022-05-07: qty 5

## 2022-05-07 MED ORDER — CHLORHEXIDINE GLUCONATE CLOTH 2 % EX PADS
6.0000 | MEDICATED_PAD | Freq: Every day | CUTANEOUS | Status: DC
  Administered 2022-05-07 – 2022-05-20 (×14): 6 via TOPICAL

## 2022-05-07 MED ORDER — FAMOTIDINE IN NACL 20-0.9 MG/50ML-% IV SOLN
20.0000 mg | Freq: Two times a day (BID) | INTRAVENOUS | Status: AC
  Administered 2022-05-07 (×2): 20 mg via INTRAVENOUS
  Filled 2022-05-07 (×2): qty 50

## 2022-05-07 MED ORDER — TRAMADOL HCL 50 MG PO TABS
50.0000 mg | ORAL_TABLET | ORAL | Status: DC | PRN
  Administered 2022-05-08: 100 mg via ORAL
  Administered 2022-05-10 – 2022-05-17 (×2): 50 mg via ORAL
  Filled 2022-05-07: qty 2
  Filled 2022-05-07 (×2): qty 1

## 2022-05-07 MED ORDER — FLUTICASONE PROPIONATE 50 MCG/ACT NA SUSP
1.0000 | Freq: Every day | NASAL | Status: DC
  Administered 2022-05-08 – 2022-05-20 (×12): 1 via NASAL
  Filled 2022-05-07: qty 16

## 2022-05-07 MED ORDER — VANCOMYCIN HCL IN DEXTROSE 1-5 GM/200ML-% IV SOLN
1000.0000 mg | Freq: Once | INTRAVENOUS | Status: AC
  Administered 2022-05-07: 1000 mg via INTRAVENOUS
  Filled 2022-05-07: qty 200

## 2022-05-07 MED ORDER — VANCOMYCIN HCL 1000 MG IV SOLR
INTRAVENOUS | Status: DC | PRN
  Administered 2022-05-07: 1000 mL

## 2022-05-07 MED ORDER — METOPROLOL TARTRATE 12.5 MG HALF TABLET: 12.5000 mg | ORAL_TABLET | Freq: Two times a day (BID) | ORAL | Status: DC

## 2022-05-07 MED ORDER — HEPARIN SODIUM (PORCINE) 1000 UNIT/ML IJ SOLN
INTRAMUSCULAR | Status: AC
  Filled 2022-05-07: qty 1

## 2022-05-07 MED ORDER — PROTAMINE SULFATE 10 MG/ML IV SOLN
INTRAVENOUS | Status: DC | PRN
  Administered 2022-05-07: 220 mg via INTRAVENOUS

## 2022-05-07 MED ORDER — CEFAZOLIN SODIUM-DEXTROSE 2-4 GM/100ML-% IV SOLN
2.0000 g | Freq: Three times a day (TID) | INTRAVENOUS | Status: AC
  Administered 2022-05-07 – 2022-05-09 (×6): 2 g via INTRAVENOUS
  Filled 2022-05-07 (×6): qty 100

## 2022-05-07 MED ORDER — ACETAMINOPHEN 160 MG/5ML PO SOLN
650.0000 mg | Freq: Once | ORAL | Status: AC
  Administered 2022-05-07: 650 mg
  Filled 2022-05-07: qty 20.3

## 2022-05-07 MED ORDER — HEPARIN SODIUM (PORCINE) 1000 UNIT/ML IJ SOLN
INTRAMUSCULAR | Status: DC | PRN
  Administered 2022-05-07: 22000 [IU] via INTRAVENOUS

## 2022-05-07 MED ORDER — NITROGLYCERIN IN D5W 200-5 MCG/ML-% IV SOLN: 0.0000 ug/min | INTRAVENOUS | Status: DC

## 2022-05-07 MED ORDER — METOPROLOL TARTRATE 5 MG/5ML IV SOLN: 2.5000 mg | INTRAVENOUS | Status: DC | PRN

## 2022-05-07 MED ORDER — SODIUM CHLORIDE 0.9 % IV SOLN: INTRAVENOUS | Status: DC | PRN

## 2022-05-07 MED ORDER — PHENYLEPHRINE 80 MCG/ML (10ML) SYRINGE FOR IV PUSH (FOR BLOOD PRESSURE SUPPORT)
PREFILLED_SYRINGE | INTRAVENOUS | Status: AC
  Filled 2022-05-07: qty 20

## 2022-05-07 MED ORDER — FLUOXETINE HCL 20 MG PO CAPS
20.0000 mg | ORAL_CAPSULE | Freq: Every day | ORAL | Status: DC
  Administered 2022-05-08 – 2022-05-20 (×13): 20 mg via ORAL
  Filled 2022-05-07 (×14): qty 1

## 2022-05-07 MED ORDER — MONTELUKAST SODIUM 10 MG PO TABS
10.0000 mg | ORAL_TABLET | Freq: Every day | ORAL | Status: DC
  Administered 2022-05-07 – 2022-05-19 (×13): 10 mg via ORAL
  Filled 2022-05-07 (×13): qty 1

## 2022-05-07 MED ORDER — ONDANSETRON HCL 4 MG/2ML IJ SOLN
INTRAMUSCULAR | Status: AC
  Filled 2022-05-07: qty 2

## 2022-05-07 MED ORDER — EPHEDRINE SULFATE-NACL 50-0.9 MG/10ML-% IV SOSY
PREFILLED_SYRINGE | INTRAVENOUS | Status: DC | PRN
  Administered 2022-05-07: 2.5 mg via INTRAVENOUS
  Administered 2022-05-07: 10 mg via INTRAVENOUS
  Administered 2022-05-07: 2.5 mg via INTRAVENOUS

## 2022-05-07 MED ORDER — ACETAMINOPHEN 650 MG RE SUPP: 650.0000 mg | Freq: Once | RECTAL | Status: AC

## 2022-05-07 MED ORDER — BISACODYL 10 MG RE SUPP
10.0000 mg | Freq: Every day | RECTAL | Status: DC
  Filled 2022-05-07: qty 1

## 2022-05-07 MED ORDER — ASPIRIN 81 MG PO CHEW: 324.0000 mg | CHEWABLE_TABLET | Freq: Every day | ORAL | Status: DC

## 2022-05-07 MED ORDER — FENTANYL CITRATE (PF) 250 MCG/5ML IJ SOLN
INTRAMUSCULAR | Status: DC | PRN
  Administered 2022-05-07 (×2): 50 ug via INTRAVENOUS
  Administered 2022-05-07: 100 ug via INTRAVENOUS
  Administered 2022-05-07: 50 ug via INTRAVENOUS
  Administered 2022-05-07: 500 ug via INTRAVENOUS
  Administered 2022-05-07: 100 ug via INTRAVENOUS
  Administered 2022-05-07 (×2): 50 ug via INTRAVENOUS

## 2022-05-07 MED ORDER — METOPROLOL TARTRATE 25 MG/10 ML ORAL SUSPENSION: 12.5000 mg | Freq: Two times a day (BID) | ORAL | Status: DC

## 2022-05-07 MED ORDER — LACTATED RINGERS IV SOLN: INTRAVENOUS | Status: DC

## 2022-05-07 MED ORDER — MIDAZOLAM HCL 2 MG/2ML IJ SOLN
INTRAMUSCULAR | Status: AC
  Filled 2022-05-07: qty 2

## 2022-05-07 MED ORDER — SODIUM CHLORIDE 0.9% FLUSH
3.0000 mL | Freq: Two times a day (BID) | INTRAVENOUS | Status: DC
  Administered 2022-05-08 – 2022-05-20 (×23): 3 mL via INTRAVENOUS

## 2022-05-07 MED ORDER — LACTATED RINGERS IV SOLN: INTRAVENOUS | Status: DC | PRN

## 2022-05-07 MED ORDER — VASOPRESSIN 20 UNIT/ML IV SOLN
INTRAVENOUS | Status: AC
  Filled 2022-05-07: qty 1

## 2022-05-07 MED ORDER — METOPROLOL TARTRATE 12.5 MG HALF TABLET: 12.5000 mg | ORAL_TABLET | Freq: Once | ORAL | Status: DC

## 2022-05-07 MED ORDER — CHLORHEXIDINE GLUCONATE 0.12 % MT SOLN
15.0000 mL | Freq: Once | OROMUCOSAL | Status: AC
  Administered 2022-05-07: 15 mL via OROMUCOSAL
  Filled 2022-05-07: qty 15

## 2022-05-07 MED ORDER — CHLORHEXIDINE GLUCONATE 4 % EX LIQD: 30.0000 mL | CUTANEOUS | Status: DC

## 2022-05-07 MED ORDER — POTASSIUM CHLORIDE 10 MEQ/50ML IV SOLN: 10.0000 meq | INTRAVENOUS | Status: AC

## 2022-05-07 MED ORDER — EPINEPHRINE HCL 5 MG/250ML IV SOLN IN NS
0.0000 ug/min | INTRAVENOUS | Status: DC
  Administered 2022-05-07: 4 ug/min via INTRAVENOUS
  Administered 2022-05-08: 6.5 ug/min via INTRAVENOUS
  Administered 2022-05-08: 8 ug/min via INTRAVENOUS
  Administered 2022-05-09: 7 ug/min via INTRAVENOUS
  Administered 2022-05-09: 5.5 ug/min via INTRAVENOUS
  Administered 2022-05-10: 4.5 ug/min via INTRAVENOUS
  Administered 2022-05-10: 7 ug/min via INTRAVENOUS
  Administered 2022-05-11 – 2022-05-14 (×3): 2 ug/min via INTRAVENOUS
  Filled 2022-05-07 (×9): qty 250

## 2022-05-07 MED ORDER — CHLORHEXIDINE GLUCONATE 0.12 % MT SOLN: 15.0000 mL | Freq: Once | OROMUCOSAL | Status: DC

## 2022-05-07 MED ORDER — DOCUSATE SODIUM 100 MG PO CAPS
200.0000 mg | ORAL_CAPSULE | Freq: Every day | ORAL | Status: DC
  Administered 2022-05-08 – 2022-05-13 (×4): 200 mg via ORAL
  Filled 2022-05-07 (×12): qty 2

## 2022-05-07 MED ORDER — SODIUM CHLORIDE 0.9 % IV SOLN: 250.0000 mL | INTRAVENOUS | Status: DC

## 2022-05-07 MED ORDER — CHLORHEXIDINE GLUCONATE 0.12 % MT SOLN
15.0000 mL | OROMUCOSAL | Status: AC
  Administered 2022-05-07: 15 mL via OROMUCOSAL
  Filled 2022-05-07: qty 15

## 2022-05-07 MED ORDER — OXYCODONE HCL 5 MG PO TABS
5.0000 mg | ORAL_TABLET | ORAL | Status: DC | PRN
  Administered 2022-05-08 – 2022-05-09 (×3): 10 mg via ORAL
  Filled 2022-05-07 (×3): qty 2

## 2022-05-07 MED ORDER — ROCURONIUM BROMIDE 10 MG/ML (PF) SYRINGE
PREFILLED_SYRINGE | INTRAVENOUS | Status: DC | PRN
  Administered 2022-05-07: 30 mg via INTRAVENOUS
  Administered 2022-05-07 (×3): 50 mg via INTRAVENOUS
  Administered 2022-05-07: 70 mg via INTRAVENOUS

## 2022-05-07 MED ORDER — ATORVASTATIN CALCIUM 80 MG PO TABS
80.0000 mg | ORAL_TABLET | Freq: Every day | ORAL | Status: DC
  Administered 2022-05-08 – 2022-05-19 (×12): 80 mg via ORAL
  Filled 2022-05-07 (×12): qty 1

## 2022-05-07 MED ORDER — SODIUM CHLORIDE 0.9% FLUSH: 3.0000 mL | INTRAVENOUS | Status: DC | PRN

## 2022-05-07 MED ORDER — BUSPIRONE HCL 5 MG PO TABS
10.0000 mg | ORAL_TABLET | Freq: Two times a day (BID) | ORAL | Status: DC
  Administered 2022-05-07 – 2022-05-20 (×27): 10 mg via ORAL
  Filled 2022-05-07 (×5): qty 1
  Filled 2022-05-07: qty 2
  Filled 2022-05-07 (×5): qty 1
  Filled 2022-05-07: qty 2
  Filled 2022-05-07: qty 1
  Filled 2022-05-07: qty 2
  Filled 2022-05-07 (×2): qty 1
  Filled 2022-05-07: qty 2
  Filled 2022-05-07 (×3): qty 1
  Filled 2022-05-07: qty 2
  Filled 2022-05-07 (×6): qty 1
  Filled 2022-05-07: qty 2

## 2022-05-07 MED ORDER — ACETAMINOPHEN 160 MG/5ML PO SOLN
1000.0000 mg | Freq: Four times a day (QID) | ORAL | Status: AC
  Administered 2022-05-07: 1000 mg
  Filled 2022-05-07: qty 40.6

## 2022-05-07 MED ORDER — ACETAMINOPHEN 500 MG PO TABS
1000.0000 mg | ORAL_TABLET | Freq: Four times a day (QID) | ORAL | Status: AC
  Administered 2022-05-08 – 2022-05-12 (×18): 1000 mg via ORAL
  Filled 2022-05-07 (×17): qty 2

## 2022-05-07 MED ORDER — ~~LOC~~ CARDIAC SURGERY, PATIENT & FAMILY EDUCATION
Freq: Once | Status: DC
  Filled 2022-05-07: qty 1

## 2022-05-07 MED ORDER — BISACODYL 5 MG PO TBEC
10.0000 mg | DELAYED_RELEASE_TABLET | Freq: Every day | ORAL | Status: DC
  Administered 2022-05-08 – 2022-05-13 (×4): 10 mg via ORAL
  Filled 2022-05-07 (×10): qty 2

## 2022-05-07 MED ORDER — MIDAZOLAM HCL 2 MG/2ML IJ SOLN: 2.0000 mg | INTRAMUSCULAR | Status: DC | PRN

## 2022-05-07 MED ORDER — ASPIRIN 325 MG PO TBEC
325.0000 mg | DELAYED_RELEASE_TABLET | Freq: Every day | ORAL | Status: DC
  Administered 2022-05-08: 325 mg via ORAL
  Filled 2022-05-07: qty 1

## 2022-05-07 MED ORDER — SODIUM BICARBONATE 8.4 % IV SOLN
50.0000 meq | Freq: Once | INTRAVENOUS | Status: AC
  Administered 2022-05-07: 50 meq via INTRAVENOUS

## 2022-05-07 MED ORDER — SODIUM CHLORIDE 0.45 % IV SOLN: INTRAVENOUS | Status: DC | PRN

## 2022-05-07 MED ORDER — DEXTROSE 50 % IV SOLN: 0.0000 mL | INTRAVENOUS | Status: DC | PRN

## 2022-05-07 MED ORDER — MAGNESIUM SULFATE 4 GM/100ML IV SOLN
4.0000 g | Freq: Once | INTRAVENOUS | Status: AC
  Administered 2022-05-07: 4 g via INTRAVENOUS
  Filled 2022-05-07: qty 100

## 2022-05-07 MED ORDER — MIDAZOLAM HCL (PF) 5 MG/ML IJ SOLN
INTRAMUSCULAR | Status: DC | PRN
  Administered 2022-05-07: 2 mg via INTRAVENOUS
  Administered 2022-05-07: 1 mg via INTRAVENOUS
  Administered 2022-05-07: 2 mg via INTRAVENOUS
  Administered 2022-05-07 (×2): 1 mg via INTRAVENOUS
  Administered 2022-05-07: 2 mg via INTRAVENOUS
  Administered 2022-05-07 (×3): 1 mg via INTRAVENOUS

## 2022-05-07 MED ORDER — VANCOMYCIN HCL 1000 MG IV SOLR
INTRAVENOUS | Status: AC
  Filled 2022-05-07: qty 20

## 2022-05-07 MED ORDER — DEXMEDETOMIDINE HCL IN NACL 400 MCG/100ML IV SOLN
0.0000 ug/kg/h | INTRAVENOUS | Status: DC
  Administered 2022-05-07: 0.7 ug/kg/h via INTRAVENOUS
  Filled 2022-05-07: qty 100

## 2022-05-07 MED ORDER — MOMETASONE FURO-FORMOTEROL FUM 200-5 MCG/ACT IN AERO
2.0000 | INHALATION_SPRAY | Freq: Two times a day (BID) | RESPIRATORY_TRACT | Status: DC
  Administered 2022-05-08 – 2022-05-20 (×25): 2 via RESPIRATORY_TRACT
  Filled 2022-05-07: qty 8.8

## 2022-05-07 MED ORDER — MIDAZOLAM HCL (PF) 10 MG/2ML IJ SOLN
INTRAMUSCULAR | Status: AC
  Filled 2022-05-07: qty 2

## 2022-05-07 MED ORDER — ALBUTEROL SULFATE (2.5 MG/3ML) 0.083% IN NEBU: 2.5000 mg | INHALATION_SOLUTION | RESPIRATORY_TRACT | Status: DC | PRN

## 2022-05-07 MED ORDER — MORPHINE SULFATE (PF) 2 MG/ML IV SOLN: 1.0000 mg | INTRAVENOUS | Status: DC | PRN

## 2022-05-07 SURGICAL SUPPLY — 101 items
ADAPTER CARDIO PERF ANTE/RETRO (ADAPTER) ×2 IMPLANT
ADPR CRDPLG 7.5 .25D 1 LRG Y (ADAPTER) ×2
ADPR PRFSN 84XANTGRD RTRGD (ADAPTER) ×2
ADPR TBG 2 MALE LL ART (MISCELLANEOUS) ×2
ANTIFOG SOL W/FOAM PAD STRL (MISCELLANEOUS) ×2
BAG DECANTER FOR FLEXI CONT (MISCELLANEOUS) ×2 IMPLANT
BLADE CLIPPER SURG (BLADE) ×2 IMPLANT
BLADE STERNUM SYSTEM 6 (BLADE) ×2 IMPLANT
BOOT SUTURE VASCULAR YLW (MISCELLANEOUS) ×2
CANISTER SUCT 3000ML PPV (MISCELLANEOUS) ×2 IMPLANT
CANN PRFSN 3/8XCNCT ST RT ANG (MISCELLANEOUS) ×2
CANNULA NON VENT 20FR 12 (CANNULA) ×2 IMPLANT
CANNULA NON VENT 22FR 12 (CANNULA) ×2 IMPLANT
CANNULA PRFSN 3/8XCNCT RT ANG (MISCELLANEOUS) IMPLANT
CANNULA SUMP PERICARDIAL (CANNULA) ×2 IMPLANT
CANNULA VEN MTL TIP RT (MISCELLANEOUS) ×2
CANNULA VENOUS MALLSGL STG 38 (MISCELLANEOUS) IMPLANT
CANNULAE VENOUS MALLSGL STG 38 (MISCELLANEOUS) ×2
CATH HEART VENT LEFT (CATHETERS) ×2 IMPLANT
CATH RETROPLEGIA CORONARY 14FR (CATHETERS) ×2 IMPLANT
CATH ROBINSON RED A/P 18FR (CATHETERS) ×6 IMPLANT
CATH THOR STR 32F SOFT 20 RADI (CATHETERS) ×2 IMPLANT
CATH THORACIC 28FR RT ANG (CATHETERS) ×2 IMPLANT
CLAMP SUTURE YELLOW 5 PAIRS (MISCELLANEOUS) ×2 IMPLANT
CONN 1/2X1/2X1/2  BEN (MISCELLANEOUS) ×2
CONN 1/2X1/2X1/2 BEN (MISCELLANEOUS) IMPLANT
CONN Y 3/8X3/8X3/8  BEN (MISCELLANEOUS) ×2
CONN Y 3/8X3/8X3/8 BEN (MISCELLANEOUS) IMPLANT
CONNECTOR 1/2X3/8X1/2 3 WAY (MISCELLANEOUS) ×2
CONNECTOR 1/2X3/8X1/2 3WAY (MISCELLANEOUS) IMPLANT
CONNECTOR 1/4-3/8 LEUR (MISCELLANEOUS) IMPLANT
CONT SPEC 4OZ CLIKSEAL STRL BL (MISCELLANEOUS) IMPLANT
DEVICE SUT CK QUICK LOAD MINI (Prosthesis & Implant Heart) IMPLANT
DRAPE CV SPLIT W-CLR ANES SCRN (DRAPES) ×2 IMPLANT
DRAPE INCISE IOBAN 66X45 STRL (DRAPES) ×2 IMPLANT
DRAPE PERI GROIN 82X75IN TIB (DRAPES) ×2 IMPLANT
DRSG AQUACEL AG ADV 3.5X10 (GAUZE/BANDAGES/DRESSINGS) ×2 IMPLANT
ELECT CAUTERY BLADE 6.4 (BLADE) ×2 IMPLANT
ELECT REM PT RETURN 9FT ADLT (ELECTROSURGICAL) ×4
ELECTRODE REM PT RTRN 9FT ADLT (ELECTROSURGICAL) ×4 IMPLANT
FELT TEFLON 1X6 (MISCELLANEOUS) ×4 IMPLANT
GAUZE SPONGE 4X4 12PLY STRL (GAUZE/BANDAGES/DRESSINGS) ×4 IMPLANT
GLOVE BIO SURGEON STRL SZ 6.5 (GLOVE) IMPLANT
GLOVE BIO SURGEON STRL SZ7 (GLOVE) IMPLANT
GLOVE BIOGEL PI IND STRL 7.0 (GLOVE) IMPLANT
GLOVE BIOGEL PI IND STRL 7.5 (GLOVE) IMPLANT
GLOVE SS BIOGEL STRL SZ 7.5 (GLOVE) IMPLANT
GLOVE SURG SS PI 6.5 STRL IVOR (GLOVE) IMPLANT
GOWN STRL REUS W/ TWL LRG LVL3 (GOWN DISPOSABLE) ×12 IMPLANT
GOWN STRL REUS W/TWL LRG LVL3 (GOWN DISPOSABLE) ×12
HEMOSTAT SURGICEL 2X14 (HEMOSTASIS) IMPLANT
INSERT FOGARTY XLG (MISCELLANEOUS) ×2 IMPLANT
IV ADAPTER SYR DOUBLE MALE LL (MISCELLANEOUS) IMPLANT
KIT BASIN OR (CUSTOM PROCEDURE TRAY) ×2 IMPLANT
KIT SUCTION CATH 14FR (SUCTIONS) ×2 IMPLANT
KIT SUT CK MINI COMBO 4X17 (Prosthesis & Implant Heart) IMPLANT
KIT TURNOVER KIT B (KITS) ×2 IMPLANT
LINE VENT (MISCELLANEOUS) IMPLANT
NS IRRIG 1000ML POUR BTL (IV SOLUTION) ×12 IMPLANT
ORGANIZER SUTURE GABBAY-FRATER (MISCELLANEOUS) ×2 IMPLANT
PACK E OPEN HEART (SUTURE) ×2 IMPLANT
PACK OPEN HEART (CUSTOM PROCEDURE TRAY) ×2 IMPLANT
PAD ARMBOARD 7.5X6 YLW CONV (MISCELLANEOUS) ×4 IMPLANT
PAD ELECT DEFIB RADIOL ZOLL (MISCELLANEOUS) ×2 IMPLANT
PENCIL BUTTON HOLSTER BLD 10FT (ELECTRODE) ×2 IMPLANT
POSITIONER HEAD DONUT 9IN (MISCELLANEOUS) ×2 IMPLANT
SET MPS 3-ND DEL (MISCELLANEOUS) IMPLANT
SET Y-VENT ADPTR 7.5 MALE LUER (ADAPTER) IMPLANT
SOLUTION ANTFG W/FOAM PAD STRL (MISCELLANEOUS) ×2 IMPLANT
STOPCOCK 4 WAY LG BORE MALE ST (IV SETS) IMPLANT
SUT BONE WAX W31G (SUTURE) ×2 IMPLANT
SUT EB EXC GRN/WHT 2-0 V-5 (SUTURE) ×4 IMPLANT
SUT ETHIBOND 2 0 SH (SUTURE) ×2 IMPLANT
SUT ETHIBOND 2 0 SH 36X2 (SUTURE) IMPLANT
SUT ETHIBOND 3 0 SH 1 (SUTURE) IMPLANT
SUT MNCRL AB 4-0 PS2 18 (SUTURE) ×4 IMPLANT
SUT PROLENE 4 0 RB 1 (SUTURE) ×14
SUT PROLENE 4 0 SH DA (SUTURE) ×6 IMPLANT
SUT PROLENE 4-0 RB1 .5 CRCL 36 (SUTURE) ×4 IMPLANT
SUT STEEL 6MS V (SUTURE) ×2 IMPLANT
SUT STEEL SZ 6 DBL 3X14 BALL (SUTURE) ×4 IMPLANT
SUT TEM PAC WIRE 2 0 SH (SUTURE) IMPLANT
SUT VIC AB 0 CTX 36 (SUTURE) ×4
SUT VIC AB 0 CTX36XBRD ANTBCTR (SUTURE) ×4 IMPLANT
SUT VIC AB 2-0 CT1 27 (SUTURE) ×4
SUT VIC AB 2-0 CT1 TAPERPNT 27 (SUTURE) ×4 IMPLANT
SYR BULB IRRIG 60ML STRL (SYRINGE) IMPLANT
SYSTEM SAHARA CHEST DRAIN ATS (WOUND CARE) ×2 IMPLANT
TAG SUTURE CLAMP YLW 5PR (MISCELLANEOUS) ×2
TAPE CLOTH 4X10 WHT NS (GAUZE/BANDAGES/DRESSINGS) IMPLANT
TAPE PAPER 2X10 WHT MICROPORE (GAUZE/BANDAGES/DRESSINGS) IMPLANT
TOWEL GREEN STERILE (TOWEL DISPOSABLE) ×2 IMPLANT
TOWEL GREEN STERILE FF (TOWEL DISPOSABLE) ×2 IMPLANT
TRAY FOLEY SLVR 16FR TEMP STAT (SET/KITS/TRAYS/PACK) ×2 IMPLANT
TUBE CONNECTING 12X1/4 (SUCTIONS) IMPLANT
TUBING ART PRESS 48 MALE/FEM (TUBING) IMPLANT
UNDERPAD 30X36 HEAVY ABSORB (UNDERPADS AND DIAPERS) ×2 IMPLANT
VALVE AORTIC SZ21 INSP/RESIL (Valve) IMPLANT
VALVE MITRAL SZ 31 (Prosthesis & Implant Heart) IMPLANT
VENT LEFT HEART 12002 (CATHETERS) ×4
WATER STERILE IRR 1000ML POUR (IV SOLUTION) ×4 IMPLANT

## 2022-05-07 NOTE — Anesthesia Procedure Notes (Signed)
Central Venous Catheter Insertion Performed by: Annye Asa, MD, anesthesiologist Start/End3/19/2024 6:40 AM, 05/07/2022 6:56 AM Patient location: Pre-op. Preanesthetic checklist: patient identified, IV checked, risks and benefits discussed, surgical consent, monitors and equipment checked, pre-op evaluation, timeout performed and anesthesia consent Position: Trendelenburg Lidocaine 1% used for infiltration and patient sedated Hand hygiene performed , maximum sterile barriers used  and Seldinger technique used Catheter size: 8.5 Fr PA cath was placed.Sheath introducer Swan type:thermodilution Procedure performed using ultrasound guided technique. Ultrasound Notes:anatomy identified, needle tip was noted to be adjacent to the nerve/plexus identified, no ultrasound evidence of intravascular and/or intraneural injection and image(s) printed for medical record Attempts: 1 Following insertion, line sutured, dressing applied and Biopatch. Post procedure assessment: blood return through all ports, free fluid flow and no air  Patient tolerated the procedure well with no immediate complications. Additional procedure comments: PA catheter:  Routine monitors. Timeout, sterile prep, drape, FBP R neck.  Trendelenburg position.  1% Lido local, finder and trocar RIJ 1st pass with US guidance.  Cordis placed over J wire. PA catheter in easily.  Sterile dressing applied.  Patient tolerated well, VSS.  Jenita Seashore, MD.

## 2022-05-07 NOTE — Interval H&P Note (Signed)
History and Physical Interval Note:  05/07/2022 6:32 AM  Shannon [name]  has presented today for surgery, with the diagnosis of SEVERE MR MODERATE AI  MODERATE TR CHF.  The various methods of treatment have been discussed with the patient and family. After consideration of risks, benefits and other options for treatment, the patient has consented to  Procedure(s): AORTIC VALVE REPLACEMENT (AVR) (N/A) MITRAL VALVE (MV) REPLACEMENT (N/A) TRICUSPID VALVE REPAIR (N/A) TRANSESOPHAGEAL ECHOCARDIOGRAM (N/A) as a surgical intervention.  The patient's history has been reviewed, patient examined, no change in status, stable for surgery.  I have reviewed the patient's chart and labs.  Questions were answered to the patient's satisfaction.     Coralie Common

## 2022-05-07 NOTE — Consult Note (Signed)
NAME:  Shannon [name], MRN:  [id number], DOB:  11/03/1950, LOS: 0 ADMISSION DATE:  05/07/2022, CONSULTATION DATE: 3/19 REFERRING MD: Dr. Lavonna Monarch, CHIEF COMPLAINT: Status post valve replacement  History of Present Illness:  Patient is encephalopathic and/or intubated; therefore, history has been obtained from chart review.  72 year old female with past medical history of smoking, hypertension, GERD, and asthma.  She had been having some swallowing issues and was sent to cardiology for clearance prior to GI procedure and was found to have depressed LV function along with severe mitral and tricuspid regurgitation as well as moderate aortic insufficiency.  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  She was evaluated by cardiothoracic surgery who felt she was a good candidate for replacement/repair, and underwent elective aortic and mitral valve replacement on 3/19 under Dr. Tommi Rumps.  Postoperatively she was sent to the cardiac ICU for recovery on mechanical ventilation and PCCM was asked to evaluate for ICU care.   Pertinent  Medical History   has a past medical history of Anxiety, Arthritis, Asthma, CHF (congestive heart failure) ([place]), Chronic lower back pain, Dysrhythmia, GERD (gastroesophageal reflux disease), HLD (hyperlipidemia), Hypertension, Pneumonia, Stroke ([employer]), and TIA (transient ischemic attack) (04/2018).   Significant Hospital Events: Including procedures, antibiotic start and stop dates in addition to other pertinent events   3/19 MVR, AVR  Interim History / Subjective:    Objective   Blood pressure 119/89, pulse 90, temperature 98.6 F (37 C), temperature source Core, resp. rate 14, height 5\' 6"  (1.676 m), weight 63 kg, SpO2 100 %. PAP: (22-241)/(-2-229) 30/11  Vent Mode: SIMV;PSV;PRVC FiO2 (%):  [50 %] 50 % Set Rate:  [16 bmp] 16 bmp Vt Set:  [470 mL] 470 mL PEEP:  [5 cmH20] 5 cmH20 Pressure Support:  [10 cmH20] 10 cmH20 Plateau Pressure:  [17 cmH20] 17 cmH20    Intake/Output Summary (Last 24 hours) at 05/07/2022 1411 Last data filed at 05/07/2022 1256 Gross per 24 hour  Intake 2615 ml  Output 1500 ml  Net 1115 ml   Filed Weights   05/07/22 0549  Weight: 63 kg    Examination: General: Elderly appearing female on mechanical ventilator HENT: normocephalic, atraumatic, PERRL, no JVD Lungs: Clear bilateral breath sounds. Chest drains with minimal sanguinous output. Cardiovascular: Paced rhythm, no MRG Abdomen: Soft, non-tender, non-distended Extremities: No acute deformity, no significant edema Neuro: Sedated GU: foley  Resolved Hospital Problem list     Assessment & Plan:   Severe Mitral regurgitation Moderate aortic regurgitation Now status post open MVR, AVR 3/19  - Post operative care per TCTS - Chest drains per TCTS - Epinephrine, phenylephrine for post-pump vasoplegia - MAP goal 65 - Ancef/Vanco for perioperative prophylaxis.  - Morphine/oxy/ultram PRN post op  Post operative respiratory insufficiency Endotracheal tube present Hx asthma, emphysema - Full vent support into wean per post cardiac surgery pathway - Will get some enteral pain medications started prior to weaning trials.  - Pulmonary hygiene - VAP bundle - Dulera in place of home [place]. Will start nebs if she does not extubate on schedule.   Chronic HFrEF:  - holding diuresis, GDMT in the immediate post operative setting.   CKD at risk AKI post op - ensure adequate hydration and hemodynamic support - trend chemistry and track UOP   Best Practice (right click and "Reselect all SmartList Selections" daily)   Diet/type: NPO DVT prophylaxis: not indicated GI prophylaxis: PPI Lines: Central line and Arterial Line Foley:  Yes, and it is still needed  Code Status:  full code Last date of multidisciplinary goals of care discussion [ ]   Labs   CBC: Recent Labs  Lab 05/03/22 1100 05/07/22 0759 05/07/22 1005 05/07/22 1039 05/07/22 1052  05/07/22 1142 05/07/22 1330  WBC 6.9  --   --   --   --   --   --   HGB 12.1   < > 6.5* 8.1* 7.1* 8.8* 10.2*  HCT 35.9*   < > 19.0* 23.9* 21.0* 26.0* 30.0*  MCV 88.6  --   --   --   --   --   --   PLT 300  --   --  112*  --   --   --    < > = values in this interval not displayed.    Basic Metabolic Panel: Recent Labs  Lab 05/03/22 1100 05/07/22 0759 05/07/22 0803 05/07/22 0838 05/07/22 0903 05/07/22 0910 05/07/22 1005 05/07/22 1052 05/07/22 1142 05/07/22 1330  NA 135   < > 141 141   < > 141 139 137 138 140  K 4.9   < > 3.4* 3.2*   < > 3.7 4.3 5.4* 4.6 4.5  CL 107  --  107 107  --  103 103 104  --   --   CO2 18*  --   --   --   --   --   --   --   --   --   GLUCOSE 102*  --  135* 131*  --  105* 159* 177*  --   --   BUN 25*  --  28* 27*  --  22 29* 23  --   --   CREATININE 1.17*  --  1.20* 1.10*  --  0.80 1.00 0.80  --   --   CALCIUM 9.0  --   --   --   --   --   --   --   --   --    < > = values in this interval not displayed.   GFR: Estimated Creatinine Clearance: 59.5 mL/min (by C-G formula based on SCr of 0.8 mg/dL). Recent Labs  Lab 05/03/22 1100  WBC 6.9    Liver Function Tests: Recent Labs  Lab 05/03/22 1100  AST 28  ALT 21  ALKPHOS 94  BILITOT 1.6*  PROT 6.7  ALBUMIN 3.4*   No results for input(s): "LIPASE", "AMYLASE" in the last 168 hours. No results for input(s): "AMMONIA" in the last 168 hours.  ABG    Component Value Date/Time   PHART 7.316 (L) 05/07/2022 1330   PCO2ART 38.1 05/07/2022 1330   PO2ART 125 (H) 05/07/2022 1330   HCO3 19.4 (L) 05/07/2022 1330   TCO2 21 (L) 05/07/2022 1330   ACIDBASEDEF 6.0 (H) 05/07/2022 1330   O2SAT 99 05/07/2022 1330     Coagulation Profile: Recent Labs  Lab 05/03/22 1100  INR 1.0    Cardiac Enzymes: No results for input(s): "CKTOTAL", "CKMB", "CKMBINDEX", "TROPONINI" in the last 168 hours.  HbA1C: Hgb A1c MFr Bld  Date/Time Value Ref Range Status  05/03/2022 11:00 AM 7.1 (H) 4.8 - 5.6 % Final     Comment:    (NOTE)         Prediabetes: 5.7 - 6.4         Diabetes: >6.4         Glycemic control for adults with diabetes: <7.0   06/17/2019 01:19 PM 5.8 (H) 4.8 - 5.6 % Final  Comment:             Prediabetes: 5.7 - 6.4          Diabetes: >6.4          Glycemic control for adults with diabetes: <7.0     CBG: Recent Labs  Lab 05/07/22 1329  GLUCAP 182*    Review of Systems:   Patient is encephalopathic and/or intubated; therefore, history has been obtained from chart review.    Past Medical History:  She,  has a past medical history of Anxiety, Arthritis, Asthma, CHF (congestive heart failure) ([place]), Chronic lower back pain, Dysrhythmia, GERD (gastroesophageal reflux disease), HLD (hyperlipidemia), Hypertension, Pneumonia, Stroke ([place]), and TIA (transient ischemic attack) (04/2018).   Surgical History:   Past Surgical History:  Procedure Laterality Date   BRONCHIAL BIOPSY  07/10/2021   Procedure: BRONCHIAL BIOPSIES;  Surgeon: Garner Nash, DO;  Location: [place] ENDOSCOPY;  Service: Pulmonary;;   BRONCHIAL BRUSHINGS  07/10/2021   Procedure: BRONCHIAL BRUSHINGS;  Surgeon: Garner Nash, DO;  Location: [place] ENDOSCOPY;  Service: Pulmonary;;   BRONCHIAL NEEDLE ASPIRATION BIOPSY  07/10/2021   Procedure: BRONCHIAL NEEDLE ASPIRATION BIOPSIES;  Surgeon: Garner Nash, DO;  Location: [place];  Service: Pulmonary;;   BRONCHIAL NEEDLE ASPIRATION BIOPSY  04/16/2022   Procedure: BRONCHIAL NEEDLE ASPIRATION BIOPSIES;  Surgeon: Garner Nash, DO;  Location: [place];  Service: Cardiopulmonary;;   BRONCHIAL WASHINGS  07/10/2021   Procedure: BRONCHIAL WASHINGS;  Surgeon: Garner Nash, DO;  Location: [place] ENDOSCOPY;  Service: Pulmonary;;   COLONOSCOPY WITH PROPOFOL N/A 03/03/2022   Procedure: COLONOSCOPY WITH PROPOFOL;  Surgeon: Carol Ada, MD;  Location: [place];  Service: Gastroenterology;  Laterality: N/A;   ESOPHAGOGASTRODUODENOSCOPY N/A 05/27/2014   Procedure:  ESOPHAGOGASTRODUODENOSCOPY (EGD);  Surgeon: Carol Ada, MD;  Location: [facility] ENDOSCOPY;  Service: Endoscopy;  Laterality: N/A;   ESOPHAGOGASTRODUODENOSCOPY (EGD) WITH PROPOFOL N/A 03/03/2022   Procedure: ESOPHAGOGASTRODUODENOSCOPY (EGD) WITH PROPOFOL;  Surgeon: Carol Ada, MD;  Location: [place];  Service: Gastroenterology;  Laterality: N/A;   FRACTURE SURGERY     PATELLA FRACTURE SURGERY Right ~ 2009   RIGHT/LEFT HEART CATH AND CORONARY ANGIOGRAPHY N/A 02/28/2022   Procedure: RIGHT/LEFT HEART CATH AND CORONARY ANGIOGRAPHY;  Surgeon: Jettie Booze, MD;  Location: [place] CV LAB;  Service: Cardiovascular;  Laterality: N/A;   SAVORY DILATION N/A 03/03/2022   Procedure: SAVORY DILATION;  Surgeon: Carol Ada, MD;  Location: [place];  Service: Gastroenterology;  Laterality: N/A;   TEE WITHOUT CARDIOVERSION N/A 03/01/2022   Procedure: TRANSESOPHAGEAL ECHOCARDIOGRAM (TEE);  Surgeon: Larey Dresser, MD;  Location: [facility] ENDOSCOPY;  Service: Cardiovascular;  Laterality: N/A;   TONSILLECTOMY  ~ [place]  1980's   [place] Bilateral 04/16/2022   Procedure: VIDEO BRONCHOSCOPY WITH ENDOBRONCHIAL ULTRASOUND;  Surgeon: Garner Nash, DO;  Location: [place];  Service: Cardiopulmonary;  Laterality: Bilateral;   VIDEO BRONCHOSCOPY WITH RADIAL ENDOBRONCHIAL ULTRASOUND  07/10/2021   Procedure: RADIAL ENDOBRONCHIAL ULTRASOUND;  Surgeon: Garner Nash, DO;  Location: [place] ENDOSCOPY;  Service: Pulmonary;;     Social History:   reports that she quit smoking about 23 years ago. Her smoking use included cigarettes. She has a 15.00 pack-year smoking history. She has never used smokeless tobacco. She reports current alcohol use. She reports that she does not use drugs.   Family History:  Her family history includes Colon cancer in her father; Hypertension in her father, mother, and sister.   Allergies  Allergies  Allergen Reactions    Amoxicillin-Pot Clavulanate Itching   Other Other (See Comments)    08/06/2018 -    06/05/2016 -    09/15/2014 -    06/16/2014 -     Home Medications  Prior to Admission medications   Medication Sig Start Date End Date Taking? Authorizing Provider  atorvastatin (LIPITOR) 80 MG tablet Take 1 tablet (80 mg total) by mouth daily at 6 PM. 05/05/18  Yes Svalina, Estill Dooms, MD  busPIRone (BUSPAR) 10 MG tablet Take 10 mg by mouth 2 (two) times daily. 05/30/20  Yes [provider]  carvedilol (COREG) 3.125 MG tablet Take 3.125 mg by mouth 2 (two) times daily. 04/22/22  Yes [provider]  Cholecalciferol (VITAMIN D) 50 MCG (2000 UT) tablet Take 2,000 Units by mouth daily.   Yes [provider]  clopidogrel (PLAVIX) 75 MG tablet Take 1 tablet (75 mg total) by mouth daily. 06/09/18  Yes Garvin Fila, MD  dapagliflozin propanediol (FARXIGA) 10 MG TABS tablet Take 1 tablet (10 mg total) by mouth daily. 03/05/22  Yes Earnie Larsson, NP  desloratadine (CLARINEX) 5 MG tablet Take 5 mg by mouth daily.   Yes [provider]  ezetimibe (ZETIA) 10 MG tablet Take 10 mg by mouth daily. 05/11/21  Yes [provider]  FLUoxetine (PROZAC) 20 MG capsule Take 20 mg by mouth daily. 04/19/20  Yes [provider]  fluticasone (FLONASE) 50 MCG/ACT nasal spray Place 1 spray into both nostrils daily.   Yes [provider]  fluticasone-salmeterol (WIXELA INHUB) 500-50 MCG/ACT AEPB Inhale 1 puff into the lungs in the morning and at bedtime. 02/19/22  Yes Freddi Starr, MD  losartan (COZAAR) 25 MG tablet Take 1/2 tablet (12.5 mg total) by mouth daily. 03/05/22  Yes Earnie Larsson, NP  metoprolol succinate (TOPROL XL) 25 MG 24 hr tablet Take 0.5 tablets (12.5 mg total) by mouth at bedtime. 03/18/22  Yes Larey Dresser, MD  montelukast (SINGULAIR) 10 MG tablet Take 1 tablet (10 mg total) by mouth at bedtime. 05/03/21  Yes Freddi Starr, MD  pantoprazole (PROTONIX) 40 MG  tablet Take 1 tablet (40 mg total) by mouth daily. 04/16/22  Yes Freddi Starr, MD  potassium chloride SA (KLOR-CON M) 20 MEQ tablet Take 1 tablet (20 mEq total) by mouth every other day on the same days that you take torsemide. 03/04/22  Yes Larey Dresser, MD  spironolactone (ALDACTONE) 25 MG tablet Take 1/2 tablet (12.5 mg total) by mouth daily. 03/05/22  Yes Earnie Larsson, NP  torsemide (DEMADEX) 20 MG tablet Take 0.5 tablets (10 mg total) by mouth every other day. 03/19/22  Yes Larey Dresser, MD  albuterol (PROVENTIL HFA;VENTOLIN HFA) 108 (90 BASE) MCG/ACT inhaler Inhale 2 puffs into the lungs every 2 (two) hours as needed for wheezing or shortness of breath (cough). 09/21/12   Hazel Sams, PA-C  albuterol (PROVENTIL) (2.5 MG/3ML) 0.083% nebulizer solution Take 3 mLs (2.5 mg total) by nebulization every 6 (six) hours as needed for wheezing or shortness of breath. 01/07/22   Freddi Starr, MD     Critical care time:

## 2022-05-07 NOTE — Anesthesia Procedure Notes (Addendum)
Arterial Line Insertion Start/End3/19/2024 7:00 AM, 05/07/2022 7:05 AM Performed by: Heide Scales, CRNA, CRNA  Preanesthetic checklist: patient identified, IV checked, site marked, risks and benefits discussed, surgical consent, monitors and equipment checked, pre-op evaluation, timeout performed and anesthesia consent Lidocaine 1% used for infiltration and patient sedated Left, radial was placed Catheter size: 20 G Hand hygiene performed  and maximum sterile barriers used  Allen's test indicative of satisfactory collateral circulation Attempts: 2 Procedure performed without using ultrasound guided technique. Following insertion, dressing applied and Biopatch. Post procedure assessment: normal  Patient tolerated the procedure well with no immediate complications. Additional procedure comments: SRNA x1 attempt; CRNA x1 with successful placement of arterial line.

## 2022-05-07 NOTE — Anesthesia Postprocedure Evaluation (Signed)
Anesthesia Post Note  Patient: Shannon [name]  Procedure(s) Performed: AORTIC VALVE REPLACEMENT (AVR) USING INSPIRIS VALVE SIZE 21MM (Chest) MITRAL VALVE (MV) REPLACEMENT USING MOSAIC 310 CINCH SIZE 31MM (Chest) TRANSESOPHAGEAL ECHOCARDIOGRAM     Patient location during evaluation: SICU Anesthesia Type: General Level of consciousness: sedated and patient remains intubated per anesthesia plan Pain management: pain level controlled Respiratory status: patient remains intubated per anesthesia plan and patient on ventilator - see flowsheet for VS Cardiovascular status: stable (Atrially paced, requiring epinephrine and phenylephrine) Postop Assessment: no apparent nausea or vomiting Anesthetic complications: no Comments: Remains critically ill    No notable events documented.  Last Vitals:  Vitals:   05/07/22 1635 05/07/22 1640  BP:    Pulse: 89 79  Resp: 16 16  Temp: 36.5 C 36.5 C  SpO2: 100% 100%    Last Pain:  Vitals:   05/07/22 1600  TempSrc: Core  PainSc:                  Shannon [name],Shannon [name]

## 2022-05-07 NOTE — Op Note (Signed)
CARDIOVASCULAR SURGERY OPERATIVE NOTE  05/07/2022 Shannon [name] [id number]  Surgeon:  Everlena Cooper, MD  First Assistant: Jadene Pierini [facility]                               An experienced assistant was required given the complexity of this surgery and the standard of surgical care. The assistant was needed for exposure, dissection, suctioning, retraction of delicate tissues and sutures, instrument exchange and for overall help during this procedure.     Preoperative Diagnosis: Moderate Aortic Regurgitation                                            Severe Mitral Regurgitation                                            Tricuspid Regurgitation  Postoperative Diagnosis:  Same but Tricuspid with no TR at time of operative TEE   Procedure: Aortic Valve Replacement with a 21 mm Inspiris Pericardial Valve (SN [id number])                      Mitral Valve Replacement with a 31 mm Mosaic Porcine Valve (SN [id number])   Anesthesia:  General Endotracheal   Clinical History/Surgical Indication:  72 yo female with NYHA class 2 symptoms of severe MR and moderate AI and TR. Pt with nonischemic cardiomyopathy with EF from 18-45% by multiple studies. Pt without diagnosis of sarcoidosis or ca from mediastinal work up and valvular pathology appears rheumatic. I feel that with pt's cardiomyopathy her risk with AVR/MVR/TV repair with mortality 5% and with possible need for inotropic or mechanical support post op.   Findings: The aortic valve and mitral valve had moderate regurgitation and there was no TR. EF was about 50%. The pt appeared very dry intravascularly. The mitral valve had significant rheumatic changes with complete fusion of A2 to the papillary muscle with other thickened chords to the posterior leaflet. Was felt best to be replaced. The aortic valve also had rolled edges of the leaflet consistent with rheumatic disease. At conclusion of procedure the EF was 30-40% and the valves were well seated and  the mitral had a mean of 53mmHg and the aortic had a mean of 22mmHg    Media Information         Preparation:  The patient was seen in the preoperative holding area and the correct patient, correct operation were confirmed with the patient after reviewing the medical record and catheterization. The consent was signed by me. Preoperative antibiotics were given. A pulmonary arterial line and radial arterial line were placed by the anesthesia team. The patient was taken back to the operating room and positioned supine on the operating room table. After being placed under general endotracheal anesthesia by the anesthesia team a foley catheter was placed. The neck, chest, abdomen, and both legs were prepped with betadine soap and solution and draped in the usual sterile manner. A surgical time-out was taken and the correct patient and operative procedure were confirmed with the nursing and anesthesia staff.  Operation: A median sternotomy incision was then created and sternal with a sternal saw.  Heparin was delivered and the pericardial  well developed.  Aorta was cannulated with a 58 French aortic cannula and a 72 French straight cannula was placed right atrial appendage and directed towards the inferior vena cava.  A 28 right angle metal tipped cannulas placed in the superior vena cava and also connected to the bypass circuit antegrade and retrograde cardioplegia catheters were placed in ascending aorta and coronary sinus effectively. With adequate confirmation of anticoagulation cardiopulmonary bypass was instituted and the aortic cross-clamp placed.  Cold blood [place] cardioplegia was then delivered for 1000 cc.  An additional dose was given approximately 80 minutes of 300 cc and a reanimation dose was delivered just prior to cross-clamp removal. The left atrium was opened and the intra-atrial groove and excellent exposure of the mitral valve was obtained.  Given secondary to rheumatic findings it  was felt the patient would best be served with replacement and the left atrial appendage was oversewn with a running 4-0 Prolene suture.  The anterior leaflet was then taken off the annulus and bisected to be utilized by rotating forward and saving all papillary muscles.  Utilizing eighteen 2-0 Tevdek pledgeted sutures with the pledgets on the atrial side again incorporating the anterior leaflet the papillary muscles a 31 mm Mosaic tissue valve was secured.  Left atrium was closed over ventricular sump Aortotomy was then performed and the aortic valve was again evidence of rheumatic changes however it was also very difficult visualization secondary to the valve implanted prosthesis in the mitral valve.  The aortic walls were also somewhat calcified and the aortic valve was removed and resected.  The annulus was decalcified. Utilizing fifteen 2-0 Tevdek pledgeted sutures a 21 mm Inspiris pericardial valve was then secured also utilizing the core knot system.  Aortotomy was then closed with a running pledgeted 4-0 Prolene suture and the patient with a headdown position aortic cross-clamp was removed and multiple de-airing maneuvers performed.  Ventricular and atrial pacing wires were placed and the patient was weaned from cardiopulmonary bypass on small amount of epinephrine support. With adequate hemodynamics protamine was delivered and the patient was decannulated and sites oversewn necessary.  Chest tubes were brought inferior stab wounds and secured and with adequate hemostasis the sternum was reapproximated with interrupted stainless steel wires and the presternal subcutaneous tissue and skin were closed in multiple layers absorbable suture.  Sterile dressings were applied.

## 2022-05-07 NOTE — Progress Notes (Signed)
37 Dr Lavonna Monarch notified of C.O. 2.1, C.I. 1.2, numbers do not seem to be improving. UOP 115 this hour, pt is warm and dry. No orders received. CVP 9

## 2022-05-07 NOTE — Progress Notes (Signed)
1700 Dr. Kipp Brood rounding at bedside. C.O. 1.8, C.I. 1.7. Verbal orders to begin weaning sedation with goal of extubation.

## 2022-05-07 NOTE — Hospital Course (Addendum)
Shannon Dresser, MD Demetrios Isaacs, FNP   History of Present Illness:     Pt is a 72 yo female who had a normal echo about 2 years ago. 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 Pt had an EF of 35%. Pt was sent for cath and found to have no CAD but elevated filling pressures and was admitted and diuresed. TEE and MRI performed have documented ongoing Cardiolmyopathy with MRI having EF of 18% and concern for severe AI and ongoing MR. Pt was felt secondary to her mitral pathology to not have a clip option. She has recently had bronchoscopy of mediastinal adenopathy without cancer and no mention of sarcoidosis. She has had esophageal dilation with improvement in her ability to swallow. Pt has had TIA of Left arm paresis and numbness that lasted for a week and resolved and has been on plavix without recurrence. Pt here for surgical consideration. She is overall doing well symptomatically with occasional DOE. No longer with lower ext edema. She reports her weight varies   The patient and all relevant studies were reviewed by Dr. Lavonna Monarch who recommended proceeding with surgical intervention.   Hospital course:  She was admitted this hospitalization electively and taken the operating room on 05/07/2022 at which time she underwent mitral valve replacement/aortic valve replacement.  Additionally she had a maze procedure.  She tolerated the procedure well was taken to the surgical intensive care unit in stable condition.  The patient was extubated on POD #1.  She required inotropic support with Epinephrine and Levophed.  She developed ventricular ectopy.  Her Co-ox decreased despite pressors.  Advanced heart failure team was consulted.  Post operative Echocardiogram showed a reduced EF of 30-35%.  Findings were consistent with acute on chronic  systolic CHF.  Additionally pulmonary hypertension was a component of findings.  There was also evidence of Biventricular failure.  They started the patient on Milrinone.  Her cardiac index and co-ox panel improved.  She was weaned slowly off Epinephrine and Levophed as hemodynamics allowed.  She is volume overloaded and did not respond well to IV Lasix.  She was started on low dose Lasix drip.  She has shown steady improvement in this regard.  She did develop AKI on top of chronic kidney disease stage IIIa however creatinine returned to normal.  Plans include advancing GDMT as able once off all pressors/inotropes.  Her chest tubes and pacing wires were removed without difficulty.  She developed worsening Thrombocytopenia and HIT panel was obtained.  This was in the normal range at 0.052.  Platelets have gradually returned to the normal range.  She does have an expected acute blood loss anemia and has required transfusion.  Cardiac rhythm has progressed over time from an accelerated junctional to sinus.  She is on both amiodarone and digoxin.  The patient has been stable off all drips.  She is hypokalemic and and was supplemented accordingly.  The patient is maintaining NSR and has been started on Digoxin therapy.  The patient developed ankle pain which is felt to be related to Gout and uric acid level was obtained and was normal. The ankle pain improved. The amiodarone was decreased to 200mg  twice daily after approximately 8gm loading over the course of several days. She maintained SR. She also progressed with mobility. She had  an episode of coughing with vomiting on post-op day 12. Her abdominal exam was benign.

## 2022-05-07 NOTE — Progress Notes (Signed)
  Echocardiogram Echocardiogram Transesophageal has been performed.  Shannon [name] 05/07/2022, 9:04 AM

## 2022-05-07 NOTE — Brief Op Note (Signed)
05/07/2022  7:16 AM  PATIENT:  Shannon [name]  72 y.o. female  PRE-OPERATIVE DIAGNOSIS:  SEVERE MITRAL REGURGITATION; MODERATE AORTIC REGURGITATION  POST-OPERATIVE DIAGNOSIS:  * No post-op diagnosis entered *  PROCEDURE:  Procedure(s): AORTIC VALVE REPLACEMENT (AVR) USING INSPIRIS VALVE SIZE 21MM (N/A) MITRAL VALVE (MV) REPLACEMENT USING MOSAIC 310 CINCH SIZE 31MM (N/A) TRANSESOPHAGEAL ECHOCARDIOGRAM (N/A)  SURGEON:  Surgeon(s) and Role:    Coralie Common, MD - Primary  PHYSICIAN ASSISTANT: Roger Kettles PA-C  ASSISTANTS: MALLORY HARDY RNFA   ANESTHESIA:   general  EBL:  800 mL   BLOOD ADMINISTERED: 630 CC PRBC  DRAINS:  PLEURAL AND MEDIASTINAL CHEST TUBES    LOCAL MEDICATIONS USED:  NONE  SPECIMEN:  Source of Specimen:  PORTIONS OF AORTIC AND MITRAL VALVES  DISPOSITION OF SPECIMEN:  PATHOLOGY  COUNTS:  YES  TOURNIQUET:  * No tourniquets in log *  DICTATION: .Dragon Dictation  PLAN OF CARE: Admit to inpatient   PATIENT DISPOSITION:  ICU - intubated and hemodynamically stable.   Delay start of Pharmacological VTE agent (>24hrs) due to surgical blood loss or risk of bleeding: yes  COMPLICATIONS: NO KNOWN

## 2022-05-07 NOTE — Transfer of Care (Signed)
Immediate Anesthesia Transfer of Care Note  Patient: Shannon [name]  Procedure(s) Performed: AORTIC VALVE REPLACEMENT (AVR) USING INSPIRIS VALVE SIZE 21MM (Chest) MITRAL VALVE (MV) REPLACEMENT USING MOSAIC 310 CINCH SIZE 31MM (Chest) TRANSESOPHAGEAL ECHOCARDIOGRAM  Patient Location: ICU  Anesthesia Type:General  Level of Consciousness: Patient remains intubated per anesthesia plan  Airway & Oxygen Therapy: Patient remains intubated per anesthesia plan and Patient placed on Ventilator (see vital sign flow sheet for setting)  Post-op Assessment: Report given to RN and Post -op Vital signs reviewed and stable  Post vital signs: Reviewed and stable  Last Vitals:  Vitals Value Taken Time  BP 119/89 05/07/22 1314  Temp 37 C 05/07/22 1319  Pulse 91 05/07/22 1319  Resp 18 05/07/22 1319  SpO2 100 % 05/07/22 1319  Vitals shown include unvalidated device data.  Last Pain:  Vitals:   05/07/22 0625  TempSrc:   PainSc: 0-No pain         Complications: No notable events documented.

## 2022-05-07 NOTE — Anesthesia Procedure Notes (Addendum)
Procedure Name: Intubation Date/Time: 05/07/2022 7:50 AM  Performed by: Heide Scales, CRNAPre-anesthesia Checklist: Patient identified, Emergency Drugs available, Suction available and Patient being monitored Patient Re-evaluated:Patient Re-evaluated prior to induction Oxygen Delivery Method: Circle system utilized Preoxygenation: Pre-oxygenation with 100% oxygen Induction Type: IV induction Ventilation: Mask ventilation without difficulty Laryngoscope Size: Mac and 3 Grade View: Grade I Tube type: Oral Number of attempts: 1 Airway Equipment and Method: Stylet Placement Confirmation: ETT inserted through vocal cords under direct vision, positive ETCO2 and breath sounds checked- equal and bilateral Secured at: 24 cm Tube secured with: Tape Dental Injury: Teeth and Oropharynx as per pre-operative assessment  Comments: Performed by Revonda Humphrey, SRNA under direct supervision by Dr. Glennon Mac and Heide Scales, CRNA

## 2022-05-07 NOTE — Progress Notes (Signed)
EVENING ROUNDS NOTE :     Shannon [name]       Shannon [name],Shannon [name]             [phone]                 Day of Surgery Procedure(s) (LRB): AORTIC VALVE REPLACEMENT (AVR) USING INSPIRIS VALVE SIZE 21MM (N/A) MITRAL VALVE (MV) REPLACEMENT USING MOSAIC 310 CINCH SIZE 31MM (N/A) TRANSESOPHAGEAL ECHOCARDIOGRAM (N/A)   Total Length of Stay:  LOS: 0 days  Events:   Low CT output Good uop BP good, CI low on moderate drips    BP 124/82   Pulse 81   Temp (!) 97.5 F (36.4 C)   Resp 16   Ht 5\' 6"  (1.676 m)   Wt 63 kg   SpO2 100%   BMI 22.44 kg/m   PAP: (22-241)/(-2-229) 40/28 CVP:  [0 mmHg-17 mmHg] 12 mmHg CO:  [1.8 L/min-2.1 L/min] 1.8 L/min CI:  [1.1 L/min/m2-1.3 L/min/m2] 1.1 L/min/m2  Vent Mode: SIMV;PSV;PRVC FiO2 (%):  [50 %] 50 % Set Rate:  [16 bmp] 16 bmp Vt Set:  [470 mL] 470 mL PEEP:  [5 cmH20] 5 cmH20 Pressure Support:  [10 cmH20] 10 cmH20 Plateau Pressure:  [17 cmH20-19 cmH20] 19 cmH20   sodium chloride     [START ON 05/08/2022] sodium chloride     sodium chloride 20 mL/hr at 05/07/22 1639   albumin human 12.5 g (05/07/22 1410)    ceFAZolin (ANCEF) IV Stopped (05/07/22 1447)   dexmedetomidine (PRECEDEX) IV infusion 0.7 mcg/kg/hr (05/07/22 1639)   epinephrine 4 mcg/min (05/07/22 1639)   famotidine (PEPCID) IV Stopped (05/07/22 1412)   insulin 1.9 Units/hr (05/07/22 1639)   lactated ringers     lactated ringers 20 mL/hr at 05/07/22 1639   lactated ringers     nitroGLYCERIN     phenylephrine (NEO-SYNEPHRINE) Adult infusion 15 mcg/min (05/07/22 1639)   vancomycin      No intake/output data recorded.      Latest Ref Rng & Units 05/07/2022    3:08 PM 05/07/2022    1:30 PM 05/07/2022    1:28 PM  CBC  WBC 4.0 - 10.5 K/uL   11.8   Hemoglobin 12.0 - 15.0 g/dL 9.2  10.2  10.7   Hematocrit 36.0 - 46.0 % 27.0  30.0  30.9   Platelets 150 - 400 K/uL   106        Latest Ref Rng & Units 05/07/2022    3:08 PM 05/07/2022    1:30 PM 05/07/2022    11:42 AM  BMP  Sodium 135 - 145 mmol/L 143  140  138   Potassium 3.5 - 5.1 mmol/L 4.1  4.5  4.6     ABG    Component Value Date/Time   PHART 7.376 05/07/2022 1508   PCO2ART 38.7 05/07/2022 1508   PO2ART 191 (H) 05/07/2022 1508   HCO3 22.8 05/07/2022 1508   TCO2 24 05/07/2022 1508   ACIDBASEDEF 2.0 05/07/2022 1508   O2SAT 65.2 05/07/2022 1515       Somtochukwu Woollard, MD 05/07/2022 5:02 PM

## 2022-05-08 ENCOUNTER — Inpatient Hospital Stay (HOSPITAL_COMMUNITY): Payer: Medicare HMO

## 2022-05-08 ENCOUNTER — Encounter (HOSPITAL_COMMUNITY): Payer: Self-pay | Admitting: Thoracic Surgery (Cardiothoracic Vascular Surgery)

## 2022-05-08 DIAGNOSIS — I342 Nonrheumatic mitral (valve) stenosis: Secondary | ICD-10-CM | POA: Diagnosis not present

## 2022-05-08 LAB — POCT I-STAT 7, (LYTES, BLD GAS, ICA,H+H)
Acid-base deficit: 5 mmol/L — ABNORMAL HIGH (ref 0.0–2.0)
Acid-base deficit: 6 mmol/L — ABNORMAL HIGH (ref 0.0–2.0)
Acid-base deficit: 5 mmol/L — ABNORMAL HIGH (ref 0.0–2.0)
Acid-base deficit: 2 mmol/L (ref 0.0–2.0)
Bicarbonate: 20.4 mmol/L (ref 20.0–28.0)
Bicarbonate: 20.7 mmol/L (ref 20.0–28.0)
Bicarbonate: 20.7 mmol/L (ref 20.0–28.0)
Bicarbonate: 22.1 mmol/L (ref 20.0–28.0)
Calcium, Ion: 1.21 mmol/L (ref 1.15–1.40)
Calcium, Ion: 1.19 mmol/L (ref 1.15–1.40)
Calcium, Ion: 1.22 mmol/L (ref 1.15–1.40)
Calcium, Ion: 1.17 mmol/L (ref 1.15–1.40)
HCT: 29 % — ABNORMAL LOW (ref 36.0–46.0)
HCT: 27 % — ABNORMAL LOW (ref 36.0–46.0)
HCT: 26 % — ABNORMAL LOW (ref 36.0–46.0)
HCT: 27 % — ABNORMAL LOW (ref 36.0–46.0)
Hemoglobin: 9.9 g/dL — ABNORMAL LOW (ref 12.0–15.0)
Hemoglobin: 9.2 g/dL — ABNORMAL LOW (ref 12.0–15.0)
Hemoglobin: 8.8 g/dL — ABNORMAL LOW (ref 12.0–15.0)
Hemoglobin: 9.2 g/dL — ABNORMAL LOW (ref 12.0–15.0)
O2 Saturation: 99 %
O2 Saturation: 99 %
O2 Saturation: 99 %
O2 Saturation: 98 %
Patient temperature: 37.3
Patient temperature: 37.2
Patient temperature: 37.3
Patient temperature: 37.6
Potassium: 3.6 mmol/L (ref 3.5–5.1)
Potassium: 3.6 mmol/L (ref 3.5–5.1)
Potassium: 4.3 mmol/L (ref 3.5–5.1)
Potassium: 4 mmol/L (ref 3.5–5.1)
Sodium: 142 mmol/L (ref 135–145)
Sodium: 142 mmol/L (ref 135–145)
Sodium: 142 mmol/L (ref 135–145)
Sodium: 136 mmol/L (ref 135–145)
TCO2: 22 mmol/L (ref 22–32)
TCO2: 22 mmol/L (ref 22–32)
TCO2: 22 mmol/L (ref 22–32)
TCO2: 23 mmol/L (ref 22–32)
pCO2 arterial: 38.9 mmHg (ref 32–48)
pCO2 arterial: 43.6 mmHg (ref 32–48)
pCO2 arterial: 42.9 mmHg (ref 32–48)
pCO2 arterial: 35.4 mmHg (ref 32–48)
pH, Arterial: 7.329 — ABNORMAL LOW (ref 7.35–7.45)
pH, Arterial: 7.286 — ABNORMAL LOW (ref 7.35–7.45)
pH, Arterial: 7.293 — ABNORMAL LOW (ref 7.35–7.45)
pH, Arterial: 7.407 (ref 7.35–7.45)
pO2, Arterial: 130 mmHg — ABNORMAL HIGH (ref 83–108)
pO2, Arterial: 179 mmHg — ABNORMAL HIGH (ref 83–108)
pO2, Arterial: 152 mmHg — ABNORMAL HIGH (ref 83–108)
pO2, Arterial: 101 mmHg (ref 83–108)

## 2022-05-08 LAB — FIBRINOGEN: Fibrinogen: 454 mg/dL (ref 210–475)

## 2022-05-08 LAB — PROTIME-INR
INR: 1.4 — ABNORMAL HIGH (ref 0.8–1.2)
Prothrombin Time: 17 seconds — ABNORMAL HIGH (ref 11.4–15.2)

## 2022-05-08 LAB — BASIC METABOLIC PANEL
Anion gap: 8 (ref 5–15)
Anion gap: 8 (ref 5–15)
BUN: 16 mg/dL (ref 8–23)
BUN: 14 mg/dL (ref 8–23)
CO2: 22 mmol/L (ref 22–32)
CO2: 22 mmol/L (ref 22–32)
Calcium: 7.9 mg/dL — ABNORMAL LOW (ref 8.9–10.3)
Calcium: 7.9 mg/dL — ABNORMAL LOW (ref 8.9–10.3)
Chloride: 112 mmol/L — ABNORMAL HIGH (ref 98–111)
Chloride: 105 mmol/L (ref 98–111)
Creatinine, Ser: 1.03 mg/dL — ABNORMAL HIGH (ref 0.44–1.00)
Creatinine, Ser: 1.05 mg/dL — ABNORMAL HIGH (ref 0.44–1.00)
GFR, Estimated: 58 mL/min — ABNORMAL LOW (ref >60)
GFR, Estimated: 56 mL/min — ABNORMAL LOW (ref >60)
Glucose, Bld: 137 mg/dL — ABNORMAL HIGH (ref 70–99)
Glucose, Bld: 185 mg/dL — ABNORMAL HIGH (ref 70–99)
Potassium: 3.6 mmol/L (ref 3.5–5.1)
Potassium: 3.9 mmol/L (ref 3.5–5.1)
Sodium: 142 mmol/L (ref 135–145)
Sodium: 135 mmol/L (ref 135–145)

## 2022-05-08 LAB — BPAM PLATELET PHERESIS
Blood Product Expiration Date: 202403212359
ISSUE DATE / TIME: 202403191345
Unit Type and Rh: 6200

## 2022-05-08 LAB — CBC
HCT: 27.9 % — ABNORMAL LOW (ref 36.0–46.0)
HCT: 27.5 % — ABNORMAL LOW (ref 36.0–46.0)
Hemoglobin: 9.6 g/dL — ABNORMAL LOW (ref 12.0–15.0)
Hemoglobin: 9.1 g/dL — ABNORMAL LOW (ref 12.0–15.0)
MCH: 30.3 pg (ref 26.0–34.0)
MCH: 29.9 pg (ref 26.0–34.0)
MCHC: 34.4 g/dL (ref 30.0–36.0)
MCHC: 33.1 g/dL (ref 30.0–36.0)
MCV: 88 fL (ref 80.0–100.0)
MCV: 90.5 fL (ref 80.0–100.0)
Platelets: 135 10*3/uL — ABNORMAL LOW (ref 150–400)
Platelets: 109 10*3/uL — ABNORMAL LOW (ref 150–400)
RBC: 3.17 MIL/uL — ABNORMAL LOW (ref 3.87–5.11)
RBC: 3.04 MIL/uL — ABNORMAL LOW (ref 3.87–5.11)
RDW: 15 % (ref 11.5–15.5)
RDW: 15.5 % (ref 11.5–15.5)
WBC: 12.2 10*3/uL — ABNORMAL HIGH (ref 4.0–10.5)
WBC: 15.7 10*3/uL — ABNORMAL HIGH (ref 4.0–10.5)
nRBC: 0 % (ref 0.0–0.2)
nRBC: 0 % (ref 0.0–0.2)

## 2022-05-08 LAB — APTT: aPTT: 40 seconds — ABNORMAL HIGH (ref 24–36)

## 2022-05-08 LAB — GLUCOSE, CAPILLARY
Glucose-Capillary: 131 mg/dL — ABNORMAL HIGH (ref 70–99)
Glucose-Capillary: 131 mg/dL — ABNORMAL HIGH (ref 70–99)
Glucose-Capillary: 137 mg/dL — ABNORMAL HIGH (ref 70–99)
Glucose-Capillary: 140 mg/dL — ABNORMAL HIGH (ref 70–99)
Glucose-Capillary: 144 mg/dL — ABNORMAL HIGH (ref 70–99)
Glucose-Capillary: 148 mg/dL — ABNORMAL HIGH (ref 70–99)
Glucose-Capillary: 150 mg/dL — ABNORMAL HIGH (ref 70–99)
Glucose-Capillary: 160 mg/dL — ABNORMAL HIGH (ref 70–99)
Glucose-Capillary: 169 mg/dL — ABNORMAL HIGH (ref 70–99)
Glucose-Capillary: 172 mg/dL — ABNORMAL HIGH (ref 70–99)
Glucose-Capillary: 172 mg/dL — ABNORMAL HIGH (ref 70–99)
Glucose-Capillary: 179 mg/dL — ABNORMAL HIGH (ref 70–99)
Glucose-Capillary: 193 mg/dL — ABNORMAL HIGH (ref 70–99)
Glucose-Capillary: 199 mg/dL — ABNORMAL HIGH (ref 70–99)
Glucose-Capillary: 200 mg/dL — ABNORMAL HIGH (ref 70–99)
Glucose-Capillary: 213 mg/dL — ABNORMAL HIGH (ref 70–99)

## 2022-05-08 LAB — MAGNESIUM
Magnesium: 3 mg/dL — ABNORMAL HIGH (ref 1.7–2.4)
Magnesium: 2.3 mg/dL (ref 1.7–2.4)

## 2022-05-08 LAB — PREPARE PLATELET PHERESIS: Unit division: 0

## 2022-05-08 LAB — COOXEMETRY PANEL
Carboxyhemoglobin: 2.2 % — ABNORMAL HIGH (ref 0.5–1.5)
Methemoglobin: 0.7 % (ref 0.0–1.5)
O2 Saturation: 63.1 %
Total hemoglobin: 9.3 g/dL — ABNORMAL LOW (ref 12.0–16.0)

## 2022-05-08 LAB — SURGICAL PATHOLOGY

## 2022-05-08 MED ORDER — SODIUM BICARBONATE 8.4 % IV SOLN
50.0000 meq | Freq: Once | INTRAVENOUS | Status: AC
  Administered 2022-05-08: 50 meq via INTRAVENOUS

## 2022-05-08 MED ORDER — ENOXAPARIN SODIUM 40 MG/0.4ML IJ SOSY
40.0000 mg | PREFILLED_SYRINGE | Freq: Every day | INTRAMUSCULAR | Status: DC
  Administered 2022-05-08 – 2022-05-10 (×3): 40 mg via SUBCUTANEOUS
  Filled 2022-05-08 (×3): qty 0.4

## 2022-05-08 MED ORDER — COLCHICINE 0.6 MG PO TABS
0.6000 mg | ORAL_TABLET | Freq: Two times a day (BID) | ORAL | Status: DC
  Administered 2022-05-08 – 2022-05-09 (×3): 0.6 mg via ORAL
  Filled 2022-05-08 (×3): qty 1

## 2022-05-08 MED ORDER — NOREPINEPHRINE 16 MG/250ML-% IV SOLN
0.0000 ug/min | INTRAVENOUS | Status: DC
  Administered 2022-05-08: 16 ug/min via INTRAVENOUS
  Administered 2022-05-10: 7 ug/min via INTRAVENOUS
  Administered 2022-05-11: 13 ug/min via INTRAVENOUS
  Administered 2022-05-11: 20 ug/min via INTRAVENOUS
  Administered 2022-05-13: 9 ug/min via INTRAVENOUS
  Filled 2022-05-08 (×6): qty 250

## 2022-05-08 MED ORDER — AMIODARONE HCL 200 MG PO TABS
200.0000 mg | ORAL_TABLET | Freq: Two times a day (BID) | ORAL | Status: DC
  Administered 2022-05-08 (×2): 200 mg via ORAL
  Filled 2022-05-08 (×2): qty 1

## 2022-05-08 MED ORDER — POTASSIUM CHLORIDE 10 MEQ/50ML IV SOLN
10.0000 meq | INTRAVENOUS | Status: AC
  Administered 2022-05-08 (×3): 10 meq via INTRAVENOUS

## 2022-05-08 MED ORDER — NOREPINEPHRINE 4 MG/250ML-% IV SOLN
0.0000 ug/min | INTRAVENOUS | Status: DC
  Administered 2022-05-08: 2 ug/min via INTRAVENOUS
  Administered 2022-05-08: 18 ug/min via INTRAVENOUS
  Filled 2022-05-08 (×3): qty 250

## 2022-05-08 MED ORDER — GABAPENTIN 100 MG PO CAPS
100.0000 mg | ORAL_CAPSULE | Freq: Two times a day (BID) | ORAL | Status: AC
  Administered 2022-05-08 – 2022-05-13 (×10): 100 mg via ORAL
  Filled 2022-05-08 (×10): qty 1

## 2022-05-08 MED ORDER — FUROSEMIDE 10 MG/ML IJ SOLN
40.0000 mg | Freq: Once | INTRAMUSCULAR | Status: AC
  Administered 2022-05-08: 40 mg via INTRAVENOUS
  Filled 2022-05-08: qty 4

## 2022-05-08 MED ORDER — INSULIN ASPART 100 UNIT/ML IJ SOLN
0.0000 [IU] | INTRAMUSCULAR | Status: DC
  Administered 2022-05-08 (×4): 4 [IU] via SUBCUTANEOUS
  Administered 2022-05-09 (×3): 2 [IU] via SUBCUTANEOUS

## 2022-05-08 NOTE — Progress Notes (Signed)
Patient ID: Shannon [name], female   DOB: 1950-08-06, 72 y.o.   MRN: [id number]  TCTS evening Rounds:  Remains on Epi 6 and NE 20 with MAP 68, CI 1.9, normal PAP. Wean as tolerated  UO ok  CT's 40/hr, thin serosanguinous.  Rhythm is atrial fib 80's. Pacer changed to VVI backup 50. On po amio.  She is awake and alert without complaints.  BMET    Component Value Date/Time   NA 135 05/08/2022 1526   NA 143 02/26/2022 1546   K 3.9 05/08/2022 1526   CL 105 05/08/2022 1526   CO2 22 05/08/2022 1526   GLUCOSE 185 (H) 05/08/2022 1526   BUN 14 05/08/2022 1526   BUN 12 02/26/2022 1546   CREATININE 1.05 (H) 05/08/2022 1526   CALCIUM 7.9 (L) 05/08/2022 1526   EGFR 51 (L) 02/26/2022 1546   GFRNONAA 56 (L) 05/08/2022 1526   CBC    Component Value Date/Time   WBC 15.7 (H) 05/08/2022 1526   RBC 3.04 (L) 05/08/2022 1526   HGB 9.1 (L) 05/08/2022 1526   HGB 12.3 02/26/2022 1546   HCT 27.5 (L) 05/08/2022 1526   HCT 36.8 02/26/2022 1546   PLT 109 (L) 05/08/2022 1526   PLT 277 02/26/2022 1546   MCV 90.5 05/08/2022 1526   MCV 93 02/26/2022 1546   MCH 29.9 05/08/2022 1526   MCHC 33.1 05/08/2022 1526   RDW 15.5 05/08/2022 1526   RDW 14.0 02/26/2022 1546   LYMPHSABS 1.2 01/07/2022 1200   MONOABS 0.5 01/07/2022 1200   EOSABS 0.2 01/07/2022 1200   BASOSABS 0.0 01/07/2022 1200

## 2022-05-08 NOTE — Progress Notes (Signed)
[address]       [place] [address]             [phone]      1 Day Post-Op  Procedure(s) (LRB): AORTIC VALVE REPLACEMENT (AVR) USING INSPIRIS VALVE SIZE 21MM (N/A) MITRAL VALVE (MV) REPLACEMENT USING MOSAIC 310 CINCH SIZE 31MM (N/A) TRANSESOPHAGEAL ECHOCARDIOGRAM (N/A)   Total Length of Stay:  LOS: 1 day    SUBJECTIVE: Extubated this am. She feels well CI has been improved after extubation  Vitals:   05/08/22 0630 05/08/22 0645  BP: 95/60   Pulse: 80 79  Resp: 18 16  Temp: 99 F (37.2 C) 99 F (37.2 C)  SpO2: 100% 100%    Intake/Output      03/19 0701 03/20 0700 03/20 0701 03/21 0700   I.V. (mL/kg) 3324.9 (48.5)    Blood 1069.2    IV Piggyback 1295.8    Total Intake(mL/kg) 5689.8 (83.1)    Urine (mL/kg/hr) 1870 (1.1)    Emesis/NG output 100    Blood 800    Chest Tube 400    Total Output 3170    Net +2519.8             sodium chloride     sodium chloride     sodium chloride 10 mL/hr at 05/08/22 0607   albumin human 60 mL/hr at 05/08/22 0600    ceFAZolin (ANCEF) IV 2 g (05/08/22 0608)   dexmedetomidine (PRECEDEX) IV infusion Stopped (05/07/22 1809)   epinephrine 8 mcg/min (05/08/22 0600)   insulin 1 Units/hr (05/08/22 0600)   lactated ringers     lactated ringers 20 mL/hr at 05/08/22 0600   lactated ringers     nitroGLYCERIN     phenylephrine (NEO-SYNEPHRINE) Adult infusion 15 mcg/min (05/08/22 0600)   potassium chloride 10 mEq (05/08/22 0646)    CBC    Component Value Date/Time   WBC 12.2 (H) 05/08/2022 0405   RBC 3.17 (L) 05/08/2022 0405   HGB 9.6 (L) 05/08/2022 0405   HGB 12.3 02/26/2022 1546   HCT 27.9 (L) 05/08/2022 0405   HCT 36.8 02/26/2022 1546   PLT 135 (L) 05/08/2022 0405   PLT 277 02/26/2022 1546   MCV 88.0 05/08/2022 0405   MCV 93 02/26/2022 1546   MCH 30.3 05/08/2022 0405   MCHC 34.4 05/08/2022 0405   RDW 15.0 05/08/2022 0405   RDW 14.0 02/26/2022 1546   LYMPHSABS 1.2 01/07/2022 1200    MONOABS 0.5 01/07/2022 1200   EOSABS 0.2 01/07/2022 1200   BASOSABS 0.0 01/07/2022 1200   CMP     Component Value Date/Time   NA 142 05/08/2022 0405   NA 143 02/26/2022 1546   K 3.6 05/08/2022 0405   CL 112 (H) 05/08/2022 0405   CO2 22 05/08/2022 0405   GLUCOSE 137 (H) 05/08/2022 0405   BUN 16 05/08/2022 0405   BUN 12 02/26/2022 1546   CREATININE 1.03 (H) 05/08/2022 0405   CALCIUM 7.9 (L) 05/08/2022 0405   PROT 6.7 05/03/2022 1100   PROT 6.8 02/26/2022 1546   ALBUMIN 3.4 (L) 05/03/2022 1100   ALBUMIN 4.4 02/26/2022 1546   AST 28 05/03/2022 1100   ALT 21 05/03/2022 1100   ALKPHOS 94 05/03/2022 1100   BILITOT 1.6 (H) 05/03/2022 1100   BILITOT 1.6 (H) 02/26/2022 1546   GFRNONAA 58 (L) 05/08/2022 0405   GFRAA 66 06/17/2019 1319   ABG    Component Value Date/Time   PHART 7.286 (L)  05/08/2022 0404   PCO2ART 43.6 05/08/2022 0404   PO2ART 179 (H) 05/08/2022 0404   HCO3 20.7 05/08/2022 0404   TCO2 22 05/08/2022 0404   ACIDBASEDEF 6.0 (H) 05/08/2022 0404   O2SAT 99 05/08/2022 0404   CBG (last 3)  Recent Labs    05/08/22 0401 05/08/22 0513 05/08/22 0611  GLUCAP 131* 131* 140*  EXAM Lungs: overall clear Card: RR no murmur Ext: warm Neuro:alert no focal deficits   ASSESSMENT: POD #1 SP MVR/AVR Hemodynamics are acceptable but on significant inotropy. Will check ABG and coox and start to wean epi Pulmonary: currently extubated and oxygenating well Renal: good urine output. Will diurese Will hold on coumadin today   Coralie Common, MD 05/08/2022

## 2022-05-08 NOTE — Progress Notes (Signed)
NAME:  Shannon [name], MRN:  [id number], DOB:  1950-12-23, LOS: 1 ADMISSION DATE:  05/07/2022, CONSULTATION DATE: 05/07/2022 REFERRING MD: Lavonna Monarch - TCTS CHIEF COMPLAINT: Status post valve replacement  History of Present Illness:  72 year old woman with past medical history of smoking, hypertension, GERD, and asthma.  She had been having some swallowing issues and was sent to cardiology for clearance prior to GI procedure and was found to have depressed LV function along with severe mitral and tricuspid regurgitation as well as moderate aortic insufficiency.  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  She was evaluated by cardiothoracic surgery who felt she was a good candidate for replacement/repair, and underwent elective aortic and mitral valve replacement on 3/19 under Dr. Tommi Rumps.    Postoperatively she was sent to the cardiac ICU for recovery on mechanical ventilation and PCCM was asked to evaluate for ICU care.  Pertinent Medical History:   Past Medical History:  Diagnosis Date   Anxiety    Arthritis    "right knee" (05/26/2014)   Asthma    CHF (congestive heart failure) (HCC)    chronic diastolic heart failure - sees Dr. Aundra Dubin   Chronic lower back pain    Dysrhythmia    irregular heart beat   GERD (gastroesophageal reflux disease)    HLD (hyperlipidemia)    Hypertension    Pneumonia    Stroke [facility])    TIA - ?2021   TIA (transient ischemic attack) 04/2018   Significant Hospital Events: Including procedures, antibiotic start and stop dates in addition to other pertinent events   3/19 MVR, AVR. 3/20 POD#1 from valve repair. Extubated ~0300.  Interim History / Subjective:  Extubated ~0300 to 4LNC, tolerated well No other significant events overnight POD#1 from AVR/MVR Remains on Epi, Neo; may start NE CXR with some mild edema, Lasix given Co-ox relatively stable, 63.1% from 65% pH 7.29 on ABG States she is feeling ok overall, just very fatigued Had some appetite  this morning but it faded quickly  Objective:  Blood pressure (!) 95/58, pulse 80, temperature 99.1 F (37.3 C), resp. rate 20, height 5\' 6"  (1.676 m), weight 68.5 kg, SpO2 100 %. PAP: (23-50)/(17-35) 42/22 CVP:  [4 mmHg-17 mmHg] 15 mmHg CO:  [1.8 L/min-3.3 L/min] 3.3 L/min CI:  [1.1 L/min/m2-1.9 L/min/m2] 1.9 L/min/m2  Vent Mode: PSV;CPAP FiO2 (%):  [40 %-50 %] 40 % Set Rate:  [4 bmp-16 bmp] 4 bmp Vt Set:  [470 mL] 470 mL PEEP:  [5 cmH20] 5 cmH20 Pressure Support:  [10 cmH20] 10 cmH20 Plateau Pressure:  [17 cmH20-19 cmH20] 19 cmH20   Intake/Output Summary (Last 24 hours) at 05/08/2022 0735 Last data filed at 05/08/2022 0600 Gross per 24 hour  Intake 5689.79 ml  Output 3170 ml  Net 2519.79 ml    Filed Weights   05/07/22 0549 05/08/22 0500  Weight: 63 kg 68.5 kg   Physical Examination: General: Acutely ill-appearing elderly woman in NAD. HEENT: [place]/AT, anicteric sclera, PERRL, moist mucous membranes. Neuro: Awake, oriented x 4. Mildly drowsy but wakes easily to voice. Responds to verbal stimuli. Following commands consistently. Moves all 4 extremities spontaneously. Generalized weakness. CV: Irregularly irregular, rates 80s, no m/g/r. CI 1.9. Chest: Median sternotomy with dressing c/d/i. CT in place with serosanguinous output. PULM: Breathing even and unlabored on 4LNC. Lung fields with fine bibasilar crackles R > L. GI: Soft, nontender, nondistended. Hypooactive bowel sounds. GU: Foley in place with clear-yellow UOP in bag. Extremities: No LE edema noted. Skin: Warm/dry,  no rashes.  Resolved Hospital Problem List:    Assessment & Plan:   Severe Mitral regurgitation Moderate aortic regurgitation S/p open MVR, AVR 3/19  - POD#1 from AVR/MVR - Postoperative management per TCTS - CT management per protocol - Epinephrine, Neo for post-pump vasoplegia; will adjust to Levo/Epi to remove Neo and downtitrate Epi as able - Goal MAP > 65 - Perioperative antibiotic prophylaxis  with Ancef/Vanc - Pain control with morphine, oxycodone, tramadol  Post operative respiratory insufficiency, resolved Endotracheal tube present Hx asthma, emphysema - Extubated 3/20AM ~0300 to 4LNC, tolerated well - Wean O2 for sat > 90% - Pulmonary hygiene - Bronchodilators (Dulera), nebs PRN - Diuresis as below  Chronic HFrEF  GDMT held in the immediate postoperative setting - Diuresis as tolerated, Lasix 40mg  IV x 1 - Monitor I&Os - Metoprolol 12.5mg  BID started 3/20  CKD at risk AKI post op - Trend BMP - Replete electrolytes as indicated - Monitor I&Os - Avoid nephrotoxic agents as able - Ensure adequate renal perfusion  Best Practice (right click and "Reselect all SmartList Selections" daily)   Diet/type: Regular consistency (see orders) DVT prophylaxis: LMWH GI prophylaxis: PPI Lines: Central line and Arterial Line Foley:  Yes, and it is still needed Code Status:  full code Last date of multidisciplinary goals of care discussion [Per Primary Team]  Critical care time: 34 minutes   The patient is critically ill with multiple organ system failure and requires high complexity decision making for assessment and support, frequent evaluation and titration of therapies, advanced monitoring, review of radiographic studies and interpretation of complex data.   Critical Care Time devoted to patient care services, exclusive of separately billable procedures, described in this note is 34 minutes.  Lestine Mount, PA-C [facility] 05/08/22 7:35 AM  Please see [email or web address] for pager details.  From 7A-7P if no response, please call [phone] After hours, please call ELink [phone]

## 2022-05-08 NOTE — Procedures (Signed)
Extubation Procedure Note  Patient Details:   Name: Shannon [name] DOB: 03-08-1950 MRN: [id number]   Airway Documentation:    Vent end date: 05/08/22 Vent end time: 0252   Evaluation  O2 sats: stable throughout Complications: No apparent complications Patient did tolerate procedure well. Bilateral Breath Sounds: Clear   Yes  Pt extubated to 4L [place] per rapid wean protocol. NIF -30, VC 1.1L achieved, and cuff leak heard prior to extubation. No stridor post extubation, RN at bedside throughout.   [name] 05/08/2022, 2:56 AM

## 2022-05-09 ENCOUNTER — Inpatient Hospital Stay (HOSPITAL_COMMUNITY): Payer: Medicare HMO

## 2022-05-09 DIAGNOSIS — Z952 Presence of prosthetic heart valve: Secondary | ICD-10-CM

## 2022-05-09 DIAGNOSIS — N1831 Chronic kidney disease, stage 3a: Secondary | ICD-10-CM | POA: Diagnosis not present

## 2022-05-09 DIAGNOSIS — I342 Nonrheumatic mitral (valve) stenosis: Secondary | ICD-10-CM | POA: Diagnosis not present

## 2022-05-09 DIAGNOSIS — E871 Hypo-osmolality and hyponatremia: Secondary | ICD-10-CM | POA: Diagnosis not present

## 2022-05-09 DIAGNOSIS — I5023 Acute on chronic systolic (congestive) heart failure: Secondary | ICD-10-CM | POA: Diagnosis not present

## 2022-05-09 LAB — COOXEMETRY PANEL
Carboxyhemoglobin: 1.5 % (ref 0.5–1.5)
Carboxyhemoglobin: 1.3 % (ref 0.5–1.5)
Carboxyhemoglobin: 1.1 % (ref 0.5–1.5)
Carboxyhemoglobin: 0.9 % (ref 0.5–1.5)
Carboxyhemoglobin: 1.5 % (ref 0.5–1.5)
Methemoglobin: 2.2 % — ABNORMAL HIGH (ref 0.0–1.5)
Methemoglobin: <0.7 % (ref 0.0–1.5)
Methemoglobin: <0.7 % (ref 0.0–1.5)
Methemoglobin: <0.7 % (ref 0.0–1.5)
Methemoglobin: 1.4 % (ref 0.0–1.5)
O2 Saturation: 49.7 %
O2 Saturation: 50.1 %
O2 Saturation: 60.9 %
O2 Saturation: 58.6 %
O2 Saturation: 52.3 %
Total hemoglobin: 8.8 g/dL — ABNORMAL LOW (ref 12.0–16.0)
Total hemoglobin: 9 g/dL — ABNORMAL LOW (ref 12.0–16.0)
Total hemoglobin: 8.9 g/dL — ABNORMAL LOW (ref 12.0–16.0)
Total hemoglobin: 8.6 g/dL — ABNORMAL LOW (ref 12.0–16.0)
Total hemoglobin: 8.8 g/dL — ABNORMAL LOW (ref 12.0–16.0)

## 2022-05-09 LAB — CBC
HCT: 27.3 % — ABNORMAL LOW (ref 36.0–46.0)
Hemoglobin: 9 g/dL — ABNORMAL LOW (ref 12.0–15.0)
MCH: 30.1 pg (ref 26.0–34.0)
MCHC: 33 g/dL (ref 30.0–36.0)
MCV: 91.3 fL (ref 80.0–100.0)
Platelets: 97 10*3/uL — ABNORMAL LOW (ref 150–400)
RBC: 2.99 MIL/uL — ABNORMAL LOW (ref 3.87–5.11)
RDW: 15.4 % (ref 11.5–15.5)
WBC: 17.8 10*3/uL — ABNORMAL HIGH (ref 4.0–10.5)
nRBC: 0 % (ref 0.0–0.2)

## 2022-05-09 LAB — GLUCOSE, CAPILLARY
Glucose-Capillary: 85 mg/dL (ref 70–99)
Glucose-Capillary: 132 mg/dL — ABNORMAL HIGH (ref 70–99)
Glucose-Capillary: 140 mg/dL — ABNORMAL HIGH (ref 70–99)
Glucose-Capillary: 140 mg/dL — ABNORMAL HIGH (ref 70–99)
Glucose-Capillary: 138 mg/dL — ABNORMAL HIGH (ref 70–99)

## 2022-05-09 LAB — BASIC METABOLIC PANEL
Anion gap: 5 (ref 5–15)
BUN: 13 mg/dL (ref 8–23)
CO2: 22 mmol/L (ref 22–32)
Calcium: 7.5 mg/dL — ABNORMAL LOW (ref 8.9–10.3)
Chloride: 101 mmol/L (ref 98–111)
Creatinine, Ser: 0.98 mg/dL (ref 0.44–1.00)
GFR, Estimated: >60 mL/min (ref >60)
Glucose, Bld: 138 mg/dL — ABNORMAL HIGH (ref 70–99)
Potassium: 3.7 mmol/L (ref 3.5–5.1)
Sodium: 128 mmol/L — ABNORMAL LOW (ref 135–145)

## 2022-05-09 LAB — ECHOCARDIOGRAM COMPLETE
AR max vel: 0.62 cm2
AV Area VTI: 0.61 cm2
AV Area mean vel: 0.6 cm2
AV Mean grad: 14.5 mmHg
AV Peak grad: 25.3 mmHg
Ao pk vel: 2.52 m/s
Area-P 1/2: 5.02 cm2
Calc EF: 36 %
Height: 66 in
MV VTI: 0.79 cm2
S' Lateral: 4.3 cm
Single Plane A2C EF: 40.7 %
Single Plane A4C EF: 31.5 %
Weight: 2458.57 oz

## 2022-05-09 LAB — LACTIC ACID, PLASMA: Lactic Acid, Venous: 1.3 mmol/L (ref 0.5–1.9)

## 2022-05-09 LAB — PHOSPHORUS: Phosphorus: 2.9 mg/dL (ref 2.5–4.6)

## 2022-05-09 LAB — MAGNESIUM: Magnesium: 2.2 mg/dL (ref 1.7–2.4)

## 2022-05-09 MED ORDER — INSULIN ASPART 100 UNIT/ML IJ SOLN
0.0000 [IU] | Freq: Three times a day (TID) | INTRAMUSCULAR | Status: DC
  Administered 2022-05-09 – 2022-05-10 (×4): 2 [IU] via SUBCUTANEOUS
  Administered 2022-05-11: 4 [IU] via SUBCUTANEOUS
  Administered 2022-05-11: 1 [IU] via SUBCUTANEOUS
  Administered 2022-05-11 – 2022-05-12 (×2): 2 [IU] via SUBCUTANEOUS
  Administered 2022-05-12: 4 [IU] via SUBCUTANEOUS
  Administered 2022-05-12 – 2022-05-13 (×5): 2 [IU] via SUBCUTANEOUS
  Administered 2022-05-14: 4 [IU] via SUBCUTANEOUS
  Administered 2022-05-15 – 2022-05-16 (×5): 2 [IU] via SUBCUTANEOUS

## 2022-05-09 MED ORDER — ASPIRIN 81 MG PO TBEC
81.0000 mg | DELAYED_RELEASE_TABLET | Freq: Every day | ORAL | Status: DC
  Administered 2022-05-09 – 2022-05-20 (×12): 81 mg via ORAL
  Filled 2022-05-09 (×13): qty 1

## 2022-05-09 MED ORDER — ASPIRIN 81 MG PO CHEW: 324.0000 mg | CHEWABLE_TABLET | Freq: Every day | ORAL | Status: DC

## 2022-05-09 MED ORDER — POTASSIUM CHLORIDE CRYS ER 20 MEQ PO TBCR
20.0000 meq | EXTENDED_RELEASE_TABLET | ORAL | Status: AC
  Administered 2022-05-09 (×3): 20 meq via ORAL
  Filled 2022-05-09 (×3): qty 1

## 2022-05-09 MED ORDER — MILRINONE LACTATE IN DEXTROSE 20-5 MG/100ML-% IV SOLN
0.2500 ug/kg/min | INTRAVENOUS | Status: DC
  Administered 2022-05-09: 0.125 ug/kg/min via INTRAVENOUS
  Administered 2022-05-10 – 2022-05-13 (×5): 0.25 ug/kg/min via INTRAVENOUS
  Filled 2022-05-09 (×6): qty 100

## 2022-05-09 MED ORDER — PERFLUTREN LIPID MICROSPHERE
1.0000 mL | INTRAVENOUS | Status: AC | PRN
  Administered 2022-05-09: 4 mL via INTRAVENOUS

## 2022-05-09 MED ORDER — FUROSEMIDE 10 MG/ML IJ SOLN
40.0000 mg | Freq: Once | INTRAMUSCULAR | Status: AC
  Administered 2022-05-09: 40 mg via INTRAVENOUS
  Filled 2022-05-09: qty 4

## 2022-05-09 MED ORDER — FUROSEMIDE 10 MG/ML IJ SOLN: 40.0000 mg | Freq: Once | INTRAMUSCULAR | Status: DC

## 2022-05-09 MED ORDER — AMIODARONE HCL 200 MG PO TABS
400.0000 mg | ORAL_TABLET | Freq: Two times a day (BID) | ORAL | Status: DC
  Administered 2022-05-09 – 2022-05-11 (×5): 400 mg via ORAL
  Filled 2022-05-09 (×5): qty 2

## 2022-05-09 NOTE — Progress Notes (Signed)
      [address]       ,[place] [address]             [phone]      POD # 2 AVR, MVR  Sleeping currently  BP (!) 97/56   Pulse 98   Temp 99.3 F (37.4 C)   Resp (!) 28   Ht 5\' 6"  (1.676 m)   Wt 69.7 kg   SpO2 94%   BMI 24.80 kg/m  39/35 CI 2.1 Co-ox 58- milrinone increased to 0.25 Epi at 7, norepi down to 3   Intake/Output Summary (Last 24 hours) at 05/09/2022 1713 Last data filed at 05/09/2022 1700 Gross per 24 hour  Intake 2088.53 ml  Output 2460 ml  Net -371.47 ml   Diuresing well today  Remo Lipps C. Roxan Hockey, MD [facility] [phone]

## 2022-05-09 NOTE — Consult Note (Addendum)
Advanced Heart Failure Team Consult Note   Primary Physician: Demetrios Isaacs, FNP PCP-Cardiologist:  Quay Burow, MD HF: Aundra Dubin  Reason for Consultation: Acute on chronic systolic CHF  HPI:    Shannon [name] is seen today for evaluation of acute on chronic systolic CHF at the request of Dr. Lavonna Monarch, TCTS.   Recently admitted 1/24 from cath lab d/t volume overload. TEE showed EF 30-35%, mild RV dysfunction, severe MR, moderate TR, moderate AI.  Cardiac MRI showed LV EF 18%, RV EF 25%, ?severe AI, moderate-severe MR (regurgitant fraction not likely accurate with low SV), non-coronary mid-wall LGE in the basal septal wall. Hospitalization complicated by SVT req adenosine. GDMT limited by soft BP. Structural Heart reviewed TEE on mTEER candidacy. Deemed not candidate for mTEER however surgical candidate. Plan to proceed with MVR/AVR. Cath with non-obstructive CAD  Patient presented for scheduled MVR/AVR for severe MR, moderate AI and TR. S/p successful MVR/AVR 3/18. Taken to CVICU post-op, now extubated (3/20). CXR: Mild bibasilar subsegmental atelectasis with small bilateral pleural effusions. CVP 10. Co-ox 50% Has diuresed some, remains on pressors. AHF to see for A/C HFrEF.   In bed in no distress. Denies CP/SOB.   Swan #s RA  10 PA 36/20 (25)  CO 3.21 CI 1.88 PAPi 1.6  Running infusions: 5 norepi 5 epi  Cardiac studies reviewed: Echo (3/20): EF 50-55%, RV normal, mild MR Echo (1/24): EF 40-45%, Severe LAE, mod-severe MR, severely elevated RVSP, RV normal, mod-severe RAE, mod-severe TR  TEE (1/24): EF 30-35%, severe MR, moderate TR, moderate AI cMRI (1/24): LVEF 18%, RVEF 25%, moderate AI visually but regurgitant fraction 49% suggests severe AI, moderate-severe MR,  non-coronary mid-wall LGE in the basal septal wall, cannot rule out cardiac sarcoidosis (unexplained mediastinal LAN) R/LHC (1/24): Mild nonobstructive CAD; RA mean 18, PA 50/28 (mean 37), PCWP 30, CO/CI:  3.92/ 2.27  Home Medications Prior to Admission medications   Medication Sig Start Date End Date Taking? Authorizing Provider  atorvastatin (LIPITOR) 80 MG tablet Take 1 tablet (80 mg total) by mouth daily at 6 PM. 05/05/18  Yes Svalina, Estill Dooms, MD  busPIRone (BUSPAR) 10 MG tablet Take 10 mg by mouth 2 (two) times daily. 05/30/20  Yes [provider]  carvedilol (COREG) 3.125 MG tablet Take 3.125 mg by mouth 2 (two) times daily. 04/22/22  Yes [provider]  Cholecalciferol (VITAMIN D) 50 MCG (2000 UT) tablet Take 2,000 Units by mouth daily.   Yes [provider]  clopidogrel (PLAVIX) 75 MG tablet Take 1 tablet (75 mg total) by mouth daily. 06/09/18  Yes Garvin Fila, MD  dapagliflozin propanediol (FARXIGA) 10 MG TABS tablet Take 1 tablet (10 mg total) by mouth daily. 03/05/22  Yes Earnie Larsson, NP  desloratadine (CLARINEX) 5 MG tablet Take 5 mg by mouth daily.   Yes [provider]  ezetimibe (ZETIA) 10 MG tablet Take 10 mg by mouth daily. 05/11/21  Yes [provider]  FLUoxetine (PROZAC) 20 MG capsule Take 20 mg by mouth daily. 04/19/20  Yes [provider]  fluticasone (FLONASE) 50 MCG/ACT nasal spray Place 1 spray into both nostrils daily.   Yes [provider]  fluticasone-salmeterol (WIXELA INHUB) 500-50 MCG/ACT AEPB Inhale 1 puff into the lungs in the morning and at bedtime. 02/19/22  Yes Freddi Starr, MD  losartan (COZAAR) 25 MG tablet Take 1/2 tablet (12.5 mg total) by mouth daily. 03/05/22  Yes Earnie Larsson, NP  metoprolol succinate (TOPROL  XL) 25 MG 24 hr tablet Take 0.5 tablets (12.5 mg total) by mouth at bedtime. 03/18/22  Yes Larey Dresser, MD  montelukast (SINGULAIR) 10 MG tablet Take 1 tablet (10 mg total) by mouth at bedtime. 05/03/21  Yes Freddi Starr, MD  pantoprazole (PROTONIX) 40 MG tablet Take 1 tablet (40 mg total) by mouth daily. 04/16/22  Yes Freddi Starr, MD  potassium chloride SA (KLOR-CON M) 20  MEQ tablet Take 1 tablet (20 mEq total) by mouth every other day on the same days that you take torsemide. 03/04/22  Yes Larey Dresser, MD  spironolactone (ALDACTONE) 25 MG tablet Take 1/2 tablet (12.5 mg total) by mouth daily. 03/05/22  Yes Earnie Larsson, NP  torsemide (DEMADEX) 20 MG tablet Take 0.5 tablets (10 mg total) by mouth every other day. 03/19/22  Yes Larey Dresser, MD  albuterol (PROVENTIL HFA;VENTOLIN HFA) 108 (90 BASE) MCG/ACT inhaler Inhale 2 puffs into the lungs every 2 (two) hours as needed for wheezing or shortness of breath (cough). 09/21/12   Hazel Sams, PA-C  albuterol (PROVENTIL) (2.5 MG/3ML) 0.083% nebulizer solution Take 3 mLs (2.5 mg total) by nebulization every 6 (six) hours as needed for wheezing or shortness of breath. 01/07/22   Freddi Starr, MD    Past Medical History: Past Medical History:  Diagnosis Date   Anxiety    Arthritis    "right knee" (05/26/2014)   Asthma    CHF (congestive heart failure) (HCC)    chronic diastolic heart failure - sees Dr. Aundra Dubin   Chronic lower back pain    Dysrhythmia    irregular heart beat   GERD (gastroesophageal reflux disease)    HLD (hyperlipidemia)    Hypertension    Pneumonia    Stroke [facility])    TIA - ?2021   TIA (transient ischemic attack) 04/2018    Past Surgical History: Past Surgical History:  Procedure Laterality Date   AORTIC VALVE REPLACEMENT N/A 05/07/2022   Procedure: AORTIC VALVE REPLACEMENT (AVR) USING INSPIRIS VALVE SIZE 21MM;  Surgeon: Coralie Common, MD;  Location: [place];  Service: Open Heart Surgery;  Laterality: N/A;   BRONCHIAL BIOPSY  07/10/2021   Procedure: BRONCHIAL BIOPSIES;  Surgeon: Garner Nash, DO;  Location: [place] [facility];  Service: Pulmonary;;   BRONCHIAL BRUSHINGS  07/10/2021   Procedure: BRONCHIAL BRUSHINGS;  Surgeon: Garner Nash, DO;  Location: [place] [facility];  Service: Pulmonary;;   BRONCHIAL NEEDLE ASPIRATION BIOPSY  07/10/2021   Procedure: BRONCHIAL NEEDLE ASPIRATION  BIOPSIES;  Surgeon: Garner Nash, DO;  Location: [place];  Service: Pulmonary;;   BRONCHIAL NEEDLE ASPIRATION BIOPSY  04/16/2022   Procedure: BRONCHIAL NEEDLE ASPIRATION BIOPSIES;  Surgeon: Garner Nash, DO;  Location: [place];  Service: Cardiopulmonary;;   BRONCHIAL WASHINGS  07/10/2021   Procedure: BRONCHIAL WASHINGS;  Surgeon: Garner Nash, DO;  Location: [place] [facility];  Service: Pulmonary;;   COLONOSCOPY WITH PROPOFOL N/A 03/03/2022   Procedure: COLONOSCOPY WITH PROPOFOL;  Surgeon: Carol Ada, MD;  Location: [place];  Service: Gastroenterology;  Laterality: N/A;   ESOPHAGOGASTRODUODENOSCOPY N/A 05/27/2014   Procedure: ESOPHAGOGASTRODUODENOSCOPY (EGD);  Surgeon: Carol Ada, MD;  Location: [facility]COPY;  Service: Endoscopy;  Laterality: N/A;   ESOPHAGOGASTRODUODENOSCOPY (EGD) WITH PROPOFOL N/A 03/03/2022   Procedure: ESOPHAGOGASTRODUODENOSCOPY (EGD) WITH PROPOFOL;  Surgeon: Carol Ada, MD;  Location: [place];  Service: Gastroenterology;  Laterality: N/A;   FRACTURE SURGERY     MITRAL VALVE REPLACEMENT N/A 05/07/2022   Procedure: MITRAL VALVE (MV)  REPLACEMENT USING MOSAIC [place] 31MM;  Surgeon: Coralie Common, MD;  Location: [place];  Service: Open Heart Surgery;  Laterality: N/A;   PATELLA FRACTURE SURGERY Right ~ 2009   RIGHT/LEFT HEART CATH AND CORONARY ANGIOGRAPHY N/A 02/28/2022   Procedure: RIGHT/LEFT HEART CATH AND CORONARY ANGIOGRAPHY;  Surgeon: Jettie Booze, MD;  Location: [place] [facility];  Service: Cardiovascular;  Laterality: N/A;   SAVORY DILATION N/A 03/03/2022   Procedure: SAVORY DILATION;  Surgeon: Carol Ada, MD;  Location: [place];  Service: Gastroenterology;  Laterality: N/A;   TEE WITHOUT CARDIOVERSION N/A 03/01/2022   Procedure: TRANSESOPHAGEAL ECHOCARDIOGRAM (TEE);  Surgeon: Larey Dresser, MD;  Location[facility] END[facility]COPY;  Service: Cardiovascular;  Laterality: N/A;   TEE WITHOUT CARDIOVERSION N/A 05/07/2022   Procedure:  TRANSESOPHAGEAL ECHOCARDIOGRAM;  Surgeon: Coralie Common, MD;  Location: [place];  Service: Open Heart Surgery;  Laterality: N/A;   TONSILLECTOMY  ~ [place]  1980's   VIDEO BRONCHOSCOPY WITH ENDOBRONCHIAL ULTRASOUND Bilateral 04/16/2022   Procedure: VIDEO BRONCHOSCOPY WITH ENDOBRONCHIAL ULTRASOUND;  Surgeon: Garner Nash, DO;  Location: [place];  Service: Cardiopulmonary;  Laterality: Bilateral;   VIDEO BRONCHOSCOPY WITH RADIAL ENDOBRONCHIAL ULTRASOUND  07/10/2021   Procedure: RADIAL ENDOBRONCHIAL ULTRASOUND;  Surgeon: Garner Nash, DO;  Location: [facility];  Service: Pulmonary;;    Family History: Family History  Problem Relation Age of Onset   Hypertension Mother    Hypertension Father    Colon cancer Father    Hypertension Sister     Social History: Social History   Socioeconomic History   Marital status: Widowed    Spouse name: Not on file   Number of children: Not on file   Years of education: Not on file   Highest education level: Not on file  Occupational History   Not on file  Tobacco Use   Smoking status: Former    Packs/day: 0.50    Years: 30.00    Additional pack years: 0.00    Total pack years: 15.00    Types: Cigarettes    Quit date: 11/10/1998    Years since quitting: 23.5   Smokeless tobacco: Never  Vaping Use   Vaping Use: Never used  Substance and Sexual Activity   Alcohol use: Yes    Comment: occasional   Drug use: No   Sexual activity: Not Currently  Other Topics Concern   Not on file  Social History Narrative   Not on file   Social Determinants of Health   Financial Resource Strain: Not on file  Food Insecurity: No Food Insecurity (05/09/2022)   Hunger Vital Sign    Worried About Running Out of Food in the Last Year: Never true    Ran Out of Food in the Last Year: Never true  Transportation Needs: No Transportation Needs (05/09/2022)   PRAPARE - Hydrologist (Medical): No    Lack  of Transportation (Non-Medical): No  Physical Activity: Not on file  Stress: Not on file  Social Connections: Not on file   Allergies:  Allergies  Allergen Reactions   Amoxicillin-Pot Clavulanate Itching   Other Other (See Comments)    08/06/2018 -    06/05/2016 -    09/15/2014 -    06/16/2014 -    Objective:    Vital Signs:   Temp:  [98.4 F (36.9 C)-100.8 F (38.2 C)] 98.6 F (37 C) (03/21 0815) Pulse Rate:  [46-134] 99 (03/21 0815) Resp:  [  14-30] 21 (03/21 0815) BP: (70-142)/(40-96) 116/52 (03/21 0630) SpO2:  [86 %-100 %] 99 % (03/21 0815) Arterial Line BP: (63-178)/(38-91) 178/91 (03/21 0815) Weight:  [69.7 kg] 69.7 kg (03/21 0500) Last BM Date :  (PTA)  Weight change: Filed Weights   05/07/22 0549 05/08/22 0500 05/09/22 0500  Weight: 63 kg 68.5 kg 69.7 kg    Intake/Output:   Intake/Output Summary (Last 24 hours) at 05/09/2022 0827 Last data filed at 05/09/2022 0800 Gross per 24 hour  Intake 2225.8 ml  Output 2865 ml  Net -639.2 ml      Physical Exam  CVP 9/10 General:  elderly appearing.  No respiratory difficulty HEENT: 2L [place] Neck: supple. JVD ~14 cm. Carotids 2+ bilat; no bruits. No lymphadenopathy or thyromegaly appreciated. Swan L IJ Cor: PMI nondisplaced. Regular rate & rhythm. No rubs, gallops or murmurs. Chest tubes in place. Midsternal incision with dressing Lungs: clear Abdomen: soft, nontender, nondistended. No hepatosplenomegaly. No bruits or masses. Good bowel sounds. Extremities: no cyanosis, clubbing, rash, edema. A line L wrist.  Neuro: alert & oriented x 3, cranial nerves grossly intact. moves all 4 extremities w/o difficulty. Affect pleasant.   Telemetry   Paced 90s (Personally reviewed)    EKG    NSR 1st degree AVB, LBBB 76 bpm  Labs   Basic Metabolic Panel: Recent Labs  Lab 05/03/22 1100 05/07/22 0759 05/07/22 1052 05/07/22 1142 05/07/22 1859 05/08/22 0150 05/08/22 0405 05/08/22 0727 05/08/22 1522 05/08/22 1526  05/09/22 0027  NA 135   < > 137   < > 139   < > 142 142 136 135 128*  K 4.9   < > 5.4*   < > 3.8   < > 3.6 4.3 4.0 3.9 3.7  CL 107   < > 104  --  112*  --  112*  --   --  105 101  CO2 18*  --   --   --  21*  --  22  --   --  22 22  GLUCOSE 102*   < > 177*  --  130*  --  137*  --   --  185* 138*  BUN 25*   < > 23  --  20  --  16  --   --  14 13  CREATININE 1.17*   < > 0.80  --  1.05*  --  1.03*  --   --  1.05* 0.98  CALCIUM 9.0  --   --   --  7.9*  --  7.9*  --   --  7.9* 7.5*  MG  --   --   --   --  3.9*  --  3.0*  --   --  2.3 2.2  PHOS  --   --   --   --   --   --   --   --   --   --  2.9   < > = values in this interval not displayed.    Liver Function Tests: Recent Labs  Lab 05/03/22 1100  AST 28  ALT 21  ALKPHOS 94  BILITOT 1.6*  PROT 6.7  ALBUMIN 3.4*   No results for input(s): "LIPASE", "AMYLASE" in the last 168 hours. No results for input(s): "AMMONIA" in the last 168 hours.  CBC: Recent Labs  Lab 05/07/22 1328 05/07/22 1330 05/07/22 1859 05/08/22 0150 05/08/22 0405 05/08/22 0727 05/08/22 1522 05/08/22 1526 05/09/22 0027  WBC 11.8*  --  12.1*  --  12.2*  --   --  15.7* 17.8*  HGB 10.7*   < > 10.4*   < > 9.6* 8.8* 9.2* 9.1* 9.0*  HCT 30.9*   < > 31.2*   < > 27.9* 26.0* 27.0* 27.5* 27.3*  MCV 89.3  --  89.1  --  88.0  --   --  90.5 91.3  PLT 106*  --  154  --  135*  --   --  109* 97*   < > = values in this interval not displayed.    Cardiac Enzymes: No results for input(s): "CKTOTAL", "CKMB", "CKMBINDEX", "TROPONINI" in the last 168 hours.  BNP: BNP (last 3 results) Recent Labs    03/18/22 1540  BNP 58.1   ProBNP (last 3 results) Recent Labs    02/26/22 1546  PROBNP 2,865*   CBG: Recent Labs  Lab 05/08/22 1631 05/08/22 1946 05/08/22 2300 05/09/22 0423 05/09/22 0741  GLUCAP 172* 199* 169* 85 132*    Coagulation Studies: Recent Labs    05/07/22 1328 05/08/22 1526  LABPROT 18.6* 17.0*  INR 1.6* 1.4*   Imaging   No results  found.  Medications:     Current Medications:  acetaminophen  1,000 mg Oral Q6H   Or   acetaminophen (TYLENOL) oral liquid 160 mg/5 mL  1,000 mg Per Tube Q6H   amiodarone  400 mg Oral BID   aspirin EC  81 mg Oral Daily   atorvastatin  80 mg Oral q1800   bisacodyl  10 mg Oral Daily   Or   bisacodyl  10 mg Rectal Daily   busPIRone  10 mg Oral BID   Chlorhexidine Gluconate Cloth  6 each Topical Daily   colchicine  0.6 mg Oral BID   docusate sodium  200 mg Oral Daily   enoxaparin (LOVENOX) injection  40 mg Subcutaneous QHS   ezetimibe  10 mg Oral Daily   FLUoxetine  20 mg Oral Daily   fluticasone  1 spray Each Nare Daily   gabapentin  100 mg Oral BID   insulin aspart  0-24 Units Subcutaneous Q4H   mometasone-formoterol  2 puff Inhalation BID   montelukast  10 mg Oral QHS   pantoprazole  40 mg Oral Daily   potassium chloride  20 mEq Oral Q4H   sodium chloride flush  3 mL Intravenous Q12H    Infusions:  sodium chloride     sodium chloride     sodium chloride Stopped (05/08/22 0643)   dexmedetomidine (PRECEDEX) IV infusion Stopped (05/07/22 1809)   epinephrine 7 mcg/min (05/09/22 0800)   lactated ringers     lactated ringers 20 mL/hr at 05/09/22 0800   lactated ringers     nitroGLYCERIN     norepinephrine (LEVOPHED) Adult infusion 6 mcg/min (05/09/22 0800)      Patient Profile   Ms. Kien is a 72 y.o. AAF with chronic diastolic heart failure, HTN, HLD, h/o TIA, GERD and asthma. Former smoker (quit 72 y/o). AHF team to see for acute on chronic systolic CHF  Assessment/Plan   Valvular heart disease - s/p MVR/AVR 3/19 - post-op TEE: EF was 30-40% and the valves were well seated and the mitral had a mean of 40mmHg and the aortic had a mean of 46mmHg  - TCTS following - now extubated  Acute on chronic systolic CHF -  Echo in 0000000 showed EF 40-45%, mild RV dilation, moderate pulmonary HTN, mod-severe MR and moderate-severe TR.  - RHC/LHC (1/24)  with elevated left  and right heart filling pressures, pulmonary venous hypertension, preserved cardiac output.  NICM. Cause is uncertain, ?valvular disease.  - Volume slightly elevated, CVP 10. -3L UOP yesterday however only net -534mL with 40 IV lasix. Got another this morning. Would do one more dose this afternoon.  - co-ox 61%,  - avoid BB for now - On norepi 5 and epi 5, keep MAP >65 - Will add GDMT once BP more stable - Echo pending - strict I&O, daily weights  Pulmonary hypertension - Group 2 pulmonary venous hypertension.   Hyponatremia - Na 128 - Free water restriction, should improve with diuresis  H/o TIA/CVA - On plavix at home, anticoagulation per TCTS  CKD 3a, h/o AKI - SCr .98, stable. Baseline around 1.1 - monitor with diuresis - avoid hypotension - daily BMET    Length of Stay: Valeria, NP  05/09/2022, 8:27 AM  Advanced Heart Failure Team Pager [phone] (M-F; 7a - 5p)  Please contact [facility] for night-coverage after hours (4p -7a ) and weekends on [email or web address]  Agree with above  89 y/o woman with systolic HF due to NICM, severe MR/AI. Now POD #2 AVR/MVR.   Remains on NE, EP. Co-ox marginal. Feels weak. Chest sore.   Now AV paced with junctional rhythm underneath.  Luiz Blare numbers done personally at bedside  CVP 9 PA 39/20 PCWP 10 CI 1.9  SVR 1700 PAPi 2  General:  Weak appearing. No resp difficulty HEENT: normal Neck: supple. RIJ swan Carotids 2+ bilat; no bruits. No lymphadenopathy or thryomegaly appreciated. Cor: sternal dressing ok + CTs RRR Lungs: Decreased at bases Abdomen: soft, nontender, nondistended. No hepatosplenomegaly. No bruits or masses. Hypoactive bowel sounds. Extremities: no cyanosis, clubbing, rash, 1+edema Neuro: alert & orientedx3, cranial nerves grossly intact. moves all 4 extremities w/o difficulty. Affect pleasant  She remains on epi/NE post AVR/MVR. Hemodynamics as above. Will have slow post-op recovery with biventricular  failure. (R>L) Add milrinone. Wean EP slowly. Follow hemodynamics.   CRITICAL CARE Performed by: Glori Bickers  Total critical care time: 50 minutes  Critical care time was exclusive of separately billable procedures and treating other patients.  Critical care was necessary to treat or prevent imminent or life-threatening deterioration.  Critical care was time spent personally by me (independent of midlevel providers or residents) on the following activities: development of treatment plan with patient and/or surrogate as well as nursing, discussions with consultants, evaluation of patient's response to treatment, examination of patient, obtaining history from patient or surrogate, ordering and performing treatments and interventions, ordering and review of laboratory studies, ordering and review of radiographic studies, pulse oximetry and re-evaluation of patient's condition.  Glori Bickers, MD  12:16 PM

## 2022-05-09 NOTE — TOC CM/SW Note (Signed)
CM spoke to pt at bedside. Gave permission to speak to sister, [place]. Jonnie Finner RN3 CCM, Heart Failure TOC CM [phone] Pansy Ostrovsky RN3 CCM, Heart Failure TOC CM [phone]

## 2022-05-09 NOTE — Progress Notes (Addendum)
NAME:  Shannon [name], MRN:  [id number], DOB:  12/17/1950, LOS: 2 ADMISSION DATE:  05/07/2022, CONSULTATION DATE: 05/07/2022 REFERRING MD: Lavonna Monarch - TCTS CHIEF COMPLAINT: Status post valve replacement  History of Present Illness:  72 year old woman with past medical history of smoking, hypertension, GERD, and asthma.  She had been having some swallowing issues and was sent to cardiology for clearance prior to GI procedure and was found to have depressed LV function along with severe mitral and tricuspid regurgitation as well as moderate aortic insufficiency.  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  She was evaluated by cardiothoracic surgery who felt she was a good candidate for replacement/repair, and underwent elective aortic and mitral valve replacement on 3/19 under Dr. Tommi Rumps.    Postoperatively she was sent to the cardiac ICU for recovery on mechanical ventilation and PCCM was asked to evaluate for ICU care.  Pertinent Medical History:   Past Medical History:  Diagnosis Date   Anxiety    Arthritis    "right knee" (05/26/2014)   Asthma    CHF (congestive heart failure) (HCC)    chronic diastolic heart failure - sees Dr. Aundra Dubin   Chronic lower back pain    Dysrhythmia    irregular heart beat   GERD (gastroesophageal reflux disease)    HLD (hyperlipidemia)    Hypertension    Pneumonia    Stroke [facility])    TIA - ?2021   TIA (transient ischemic attack) 04/2018   Significant Hospital Events: Including procedures, antibiotic start and stop dates in addition to other pertinent events   3/19 MVR, AVR. 3/20 POD#1 from valve repair. Extubated ~0300. Slowly downtitrating pressors. Neo off, remains on Epi/NE. 3/21 POD#2. Bigeminy early AM with drop in CI to 1.4-1.6; Co-ox decreased to 49 from 60s. DDD Paced with significant improvement in index and Co-ox (61%). Epi weaned with drop in CI and pressures, increased back to 7. Diuresing BID. Echo pending.  Interim History / Subjective:   POD#2 from AVR, MVR Feeling ok this morning, appetite remains poor, no significant pain Bigeminy noted 3/21AM with concurrent drop in CI and Co-ox (49%) DDD paced with improvement in CI/Co-ox (61%) Initially weaning Epi, but did not tolerate Continues on NE 5, Epi 7 Diuresing BID, CVP 9-10 Echo pending  Objective:  Blood pressure (!) 116/52, pulse 98, temperature 98.8 F (37.1 C), resp. rate (!) 23, height 5\' 6"  (1.676 m), weight 69.7 kg, SpO2 91 %. PAP: (34-53)/(12-31) 49/27 CVP:  [7 mmHg-22 mmHg] 16 mmHg CO:  [2.7 L/min-3.4 L/min] 3.2 L/min CI:  [1.6 L/min/m2-2 L/min/m2] 1.9 L/min/m2      Intake/Output Summary (Last 24 hours) at 05/09/2022 0733 Last data filed at 05/09/2022 0700 Gross per 24 hour  Intake 2335.37 ml  Output 2915 ml  Net -579.63 ml    Filed Weights   05/07/22 0549 05/08/22 0500 05/09/22 0500  Weight: 63 kg 68.5 kg 69.7 kg   Physical Examination: General: Acutely ill-appearing elderly woman in NAD. Pleasant and conversant. HEENT: /AT, anicteric sclera, PERRL, moist mucous membranes.  Neck: RIJ Swan in place. Neuro: Awake, oriented x 4. Responds to verbal stimuli. Following commands consistently. Moves all 4 extremities spontaneously. Generalized weakness, strength 4/5 all 4 extremities.  CV: RRR, no m/g/r. Paced on monitor. Midline sternotomy with dressing c/d/I, no drainage/erythema.  Chest: CT with serosanguinous output (674mL/24H). PULM: Breathing even and unlabored on 3LNC. Lung fields diminished at bilateral bases. GI: Soft, nontender, nondistended. Hypoactive bowel sounds. Extremities: No significant LE edema  noted. Skin: Warm/dry, no rashes.  Resolved Hospital Problem List:    Assessment & Plan:   Severe Mitral regurgitation Moderate aortic regurgitation S/p open MVR, AVR 3/19  - POD#2 from AVR/MVR - Postoperative management per TCTS - CT management per protocol - Goal MAP > 65, goal CI > 1.8 - Epi, NE for post-pump vasoplegia; Neo off -  Did not tolerate Epi downtitration 3/21AM - DDD pacing for bigeminy, amiodarone started - Follow Co-ox - Periop antibiotic ppx - Pain control with morphine, oxy, tramadol  Post operative respiratory insufficiency, resolved Endotracheal tube present Hx asthma, emphysema Extubated 3/20AM ~0300 to 4LNC, tolerated well. - Supplemental O2 support as needed - Wean O2 for sat > 90%, 3LNC today 3/21 - Pulmonary hygiene - Bronchodilators (Dulera), nebs PRN - Diuresis as below  Chronic HFrEF  GDMT held in the immediate postoperative setting. - Trend CVP, 9-10 today 3/21 - Diuresis as tolerated, Lasix BID - Monitor I&Os - Milrinone started 3/21 - F/u Echo  ?TIA History - ASA/statin  CKD at risk AKI post op, improved Hyponatremia Hypokalemia Hypocalcemia - Trend BMP - Replete electrolytes as indicated - Expect Na to improve with diuresis - Monitor I&Os - Avoid nephrotoxic agents as able - Ensure adequate renal perfusion  Best Practice (right click and "Reselect all SmartList Selections" daily)   Diet/type: Regular consistency (see orders) DVT prophylaxis: LMWH GI prophylaxis: PPI Lines: Central line and Arterial Line Foley:  Yes, and it is still needed Code Status:  full code Last date of multidisciplinary goals of care discussion [Per Primary Team]  Critical care time: 33 minutes   The patient is critically ill with multiple organ system failure and requires high complexity decision making for assessment and support, frequent evaluation and titration of therapies, advanced monitoring, review of radiographic studies and interpretation of complex data.   Critical Care Time devoted to patient care services, exclusive of separately billable procedures, described in this note is 33 minutes.  Lestine Mount, PA-C [facility] 05/09/22 7:33 AM  Please see [email or web address] for pager details.  From 7A-7P if no response, please call [phone] After hours,  please call ELink [phone]

## 2022-05-09 NOTE — Progress Notes (Signed)
[address]       [place] [address]             [phone]      2 Days Post-Op  Procedure(s) (LRB): AORTIC VALVE REPLACEMENT (AVR) USING INSPIRIS VALVE SIZE 21MM (N/A) MITRAL VALVE (MV) REPLACEMENT USING MOSAIC 310 CINCH SIZE 31MM (N/A) TRANSESOPHAGEAL ECHOCARDIOGRAM (N/A)   Total Length of Stay:  LOS: 2 days    SUBJECTIVE: Went into a bigeminy rhythm overnight Pt feels well Remains on pressors with low CI  Vitals:   05/09/22 0615 05/09/22 0630  BP: (!) 105/48 (!) 116/52  Pulse: (!) 48 (!) 50  Resp: 20 (!) 23  Temp: 99.1 F (37.3 C) 99.1 F (37.3 C)  SpO2: 100% 98%    Intake/Output      03/20 0701 03/21 0700 03/21 0701 03/22 0700   I.V. (mL/kg) 1778.3 (25.5)    Blood     IV Piggyback 408.4    Total Intake(mL/kg) 2186.7 (31.4)    Urine (mL/kg/hr) 2235 (1.3)    Emesis/NG output     Blood     Chest Tube 680    Total Output 2915    Net -728.3             sodium chloride     sodium chloride     sodium chloride Stopped (05/08/22 0643)   dexmedetomidine (PRECEDEX) IV infusion Stopped (05/07/22 1809)   epinephrine 6.5 mcg/min (05/09/22 0600)   lactated ringers     lactated ringers 20 mL/hr at 05/09/22 0600   lactated ringers     nitroGLYCERIN     norepinephrine (LEVOPHED) Adult infusion 8 mcg/min (05/09/22 0600)    CBC    Component Value Date/Time   WBC 17.8 (H) 05/09/2022 0027   RBC 2.99 (L) 05/09/2022 0027   HGB 9.0 (L) 05/09/2022 0027   HGB 12.3 02/26/2022 1546   HCT 27.3 (L) 05/09/2022 0027   HCT 36.8 02/26/2022 1546   PLT 97 (L) 05/09/2022 0027   PLT 277 02/26/2022 1546   MCV 91.3 05/09/2022 0027   MCV 93 02/26/2022 1546   MCH 30.1 05/09/2022 0027   MCHC 33.0 05/09/2022 0027   RDW 15.4 05/09/2022 0027   RDW 14.0 02/26/2022 1546   LYMPHSABS 1.2 01/07/2022 1200   MONOABS 0.5 01/07/2022 1200   EOSABS 0.2 01/07/2022 1200   BASOSABS 0.0 01/07/2022 1200   CMP     Component Value Date/Time   NA 128 (L)  05/09/2022 0027   NA 143 02/26/2022 1546   K 3.7 05/09/2022 0027   CL 101 05/09/2022 0027   CO2 22 05/09/2022 0027   GLUCOSE 138 (H) 05/09/2022 0027   BUN 13 05/09/2022 0027   BUN 12 02/26/2022 1546   CREATININE 0.98 05/09/2022 0027   CALCIUM 7.5 (L) 05/09/2022 0027   PROT 6.7 05/03/2022 1100   PROT 6.8 02/26/2022 1546   ALBUMIN 3.4 (L) 05/03/2022 1100   ALBUMIN 4.4 02/26/2022 1546   AST 28 05/03/2022 1100   ALT 21 05/03/2022 1100   ALKPHOS 94 05/03/2022 1100   BILITOT 1.6 (H) 05/03/2022 1100   BILITOT 1.6 (H) 02/26/2022 1546   GFRNONAA >60 05/09/2022 0027   GFRAA 66 06/17/2019 1319   ABG    Component Value Date/Time   PHART 7.407 05/08/2022 1522   PCO2ART 35.4 05/08/2022 1522   PO2ART 101 05/08/2022 1522   HCO3 22.1 05/08/2022 1522   TCO2 23 05/08/2022 1522   ACIDBASEDEF 2.0 05/08/2022  1522   O2SAT 50.1 05/09/2022 0458   CBG (last 3)  Recent Labs    05/08/22 1946 05/08/22 2300 05/09/22 0423  GLUCAP 199* 169* 85  EXAM Lungs: decreased at bases Card: RR (now in DDD) with no murmurs Ext: warm and dry Neuro: alert and oriented   ASSESSMENT: POD #2 SP MVR/AVR Hemodynamics marginal with lower Coox on pressors. Rhythm seems to be the issue and now in DDD much better BP. Will recheck CI and Coox in an hour. Have AHF team assess and obtain an echo today Renal: good urine output but with lower Na may need to hold on diuretics.  Pulm: currently on nasal cannula. CXR with overall clear lung fields Heme: wbc relatively elevated and PLT count under 100K   will follow for now. Hct good  Will start anticoagulation tomorrow Leave CT this am   Coralie Common, MD 05/09/2022

## 2022-05-09 NOTE — Progress Notes (Signed)
Echocardiogram 2D Echocardiogram has been performed.  Shannon [name] 05/09/2022, 2:47 PM

## 2022-05-10 ENCOUNTER — Other Ambulatory Visit: Payer: Self-pay | Admitting: Thoracic Surgery (Cardiothoracic Vascular Surgery)

## 2022-05-10 ENCOUNTER — Inpatient Hospital Stay (HOSPITAL_COMMUNITY): Payer: Medicare HMO

## 2022-05-10 DIAGNOSIS — I5023 Acute on chronic systolic (congestive) heart failure: Secondary | ICD-10-CM | POA: Diagnosis not present

## 2022-05-10 DIAGNOSIS — E871 Hypo-osmolality and hyponatremia: Secondary | ICD-10-CM | POA: Diagnosis not present

## 2022-05-10 DIAGNOSIS — I342 Nonrheumatic mitral (valve) stenosis: Secondary | ICD-10-CM | POA: Diagnosis not present

## 2022-05-10 DIAGNOSIS — N1831 Chronic kidney disease, stage 3a: Secondary | ICD-10-CM | POA: Diagnosis not present

## 2022-05-10 DIAGNOSIS — Z952 Presence of prosthetic heart valve: Secondary | ICD-10-CM | POA: Diagnosis not present

## 2022-05-10 LAB — BASIC METABOLIC PANEL
Anion gap: 5 (ref 5–15)
Anion gap: 9 (ref 5–15)
BUN: 9 mg/dL (ref 8–23)
BUN: 11 mg/dL (ref 8–23)
CO2: 22 mmol/L (ref 22–32)
CO2: 23 mmol/L (ref 22–32)
Calcium: 6.8 mg/dL — ABNORMAL LOW (ref 8.9–10.3)
Calcium: 7.9 mg/dL — ABNORMAL LOW (ref 8.9–10.3)
Chloride: 105 mmol/L (ref 98–111)
Chloride: 99 mmol/L (ref 98–111)
Creatinine, Ser: 0.84 mg/dL (ref 0.44–1.00)
Creatinine, Ser: 0.96 mg/dL (ref 0.44–1.00)
GFR, Estimated: >60 mL/min (ref >60)
GFR, Estimated: >60 mL/min (ref >60)
Glucose, Bld: 209 mg/dL — ABNORMAL HIGH (ref 70–99)
Glucose, Bld: 153 mg/dL — ABNORMAL HIGH (ref 70–99)
Potassium: 3.5 mmol/L (ref 3.5–5.1)
Potassium: 3.5 mmol/L (ref 3.5–5.1)
Sodium: 132 mmol/L — ABNORMAL LOW (ref 135–145)
Sodium: 131 mmol/L — ABNORMAL LOW (ref 135–145)

## 2022-05-10 LAB — CBC
HCT: 23.5 % — ABNORMAL LOW (ref 36.0–46.0)
Hemoglobin: 8 g/dL — ABNORMAL LOW (ref 12.0–15.0)
MCH: 30.9 pg (ref 26.0–34.0)
MCHC: 34 g/dL (ref 30.0–36.0)
MCV: 90.7 fL (ref 80.0–100.0)
Platelets: 84 10*3/uL — ABNORMAL LOW (ref 150–400)
RBC: 2.59 MIL/uL — ABNORMAL LOW (ref 3.87–5.11)
RDW: 14.9 % (ref 11.5–15.5)
WBC: 14.5 10*3/uL — ABNORMAL HIGH (ref 4.0–10.5)
nRBC: 0 % (ref 0.0–0.2)

## 2022-05-10 LAB — GLUCOSE, CAPILLARY
Glucose-Capillary: 114 mg/dL — ABNORMAL HIGH (ref 70–99)
Glucose-Capillary: 141 mg/dL — ABNORMAL HIGH (ref 70–99)
Glucose-Capillary: 149 mg/dL — ABNORMAL HIGH (ref 70–99)
Glucose-Capillary: 142 mg/dL — ABNORMAL HIGH (ref 70–99)
Glucose-Capillary: 148 mg/dL — ABNORMAL HIGH (ref 70–99)

## 2022-05-10 LAB — MAGNESIUM: Magnesium: 1.5 mg/dL — ABNORMAL LOW (ref 1.7–2.4)

## 2022-05-10 LAB — COOXEMETRY PANEL
Carboxyhemoglobin: 2 % — ABNORMAL HIGH (ref 0.5–1.5)
Methemoglobin: <0.7 % (ref 0.0–1.5)
O2 Saturation: 85.2 %
Total hemoglobin: 7.5 g/dL — ABNORMAL LOW (ref 12.0–16.0)

## 2022-05-10 LAB — ALBUMIN: Albumin: 1.8 g/dL — ABNORMAL LOW (ref 3.5–5.0)

## 2022-05-10 LAB — PHOSPHORUS: Phosphorus: 2 mg/dL — ABNORMAL LOW (ref 2.5–4.6)

## 2022-05-10 MED ORDER — POTASSIUM CHLORIDE CRYS ER 20 MEQ PO TBCR
20.0000 meq | EXTENDED_RELEASE_TABLET | Freq: Once | ORAL | Status: AC
  Administered 2022-05-10: 20 meq via ORAL
  Filled 2022-05-10: qty 1

## 2022-05-10 MED ORDER — MAGNESIUM SULFATE 4 GM/100ML IV SOLN
4.0000 g | Freq: Once | INTRAVENOUS | Status: AC
  Administered 2022-05-10: 4 g via INTRAVENOUS
  Filled 2022-05-10: qty 100

## 2022-05-10 MED ORDER — CALCIUM GLUCONATE 10 % IV SOLN: 1.0000 g | Freq: Once | INTRAVENOUS | Status: DC

## 2022-05-10 MED ORDER — ENSURE ENLIVE PO LIQD
237.0000 mL | Freq: Two times a day (BID) | ORAL | Status: DC
  Administered 2022-05-11 – 2022-05-13 (×4): 237 mL via ORAL

## 2022-05-10 MED ORDER — POTASSIUM CHLORIDE 10 MEQ/50ML IV SOLN
10.0000 meq | INTRAVENOUS | Status: AC
  Administered 2022-05-10 (×3): 10 meq via INTRAVENOUS
  Filled 2022-05-10 (×3): qty 50

## 2022-05-10 MED ORDER — CALCIUM GLUCONATE-NACL 1-0.675 GM/50ML-% IV SOLN
1.0000 g | Freq: Once | INTRAVENOUS | Status: AC
  Administered 2022-05-10: 1000 mg via INTRAVENOUS
  Filled 2022-05-10: qty 50

## 2022-05-10 MED ORDER — POTASSIUM CHLORIDE CRYS ER 20 MEQ PO TBCR
40.0000 meq | EXTENDED_RELEASE_TABLET | Freq: Once | ORAL | Status: AC
  Administered 2022-05-10: 40 meq via ORAL
  Filled 2022-05-10: qty 2

## 2022-05-10 MED ORDER — FUROSEMIDE 10 MG/ML IJ SOLN
40.0000 mg | Freq: Once | INTRAMUSCULAR | Status: AC
  Administered 2022-05-10: 40 mg via INTRAVENOUS
  Filled 2022-05-10: qty 4

## 2022-05-10 MED ORDER — ORAL CARE MOUTH RINSE: 15.0000 mL | OROMUCOSAL | Status: DC | PRN

## 2022-05-10 MED ORDER — FUROSEMIDE 10 MG/ML IJ SOLN
5.0000 mg/h | INTRAVENOUS | Status: DC
  Administered 2022-05-10: 5 mg/h via INTRAVENOUS
  Filled 2022-05-10 (×2): qty 20

## 2022-05-10 NOTE — Progress Notes (Signed)
NAME:  Shannon [name], MRN:  [id number], DOB:  05/10/1950, LOS: 3 ADMISSION DATE:  05/07/2022, CONSULTATION DATE: 05/07/2022 REFERRING MD: Lavonna Monarch - TCTS CHIEF COMPLAINT: Status post valve replacement  History of Present Illness:  72 year old woman with past medical history of smoking, hypertension, GERD, and asthma.  She had been having some swallowing issues and was sent to cardiology for clearance prior to GI procedure and was found to have depressed LV function along with severe mitral and tricuspid regurgitation as well as moderate aortic insufficiency.  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  She was evaluated by cardiothoracic surgery who felt she was a good candidate for replacement/repair, and underwent elective aortic and mitral valve replacement on 3/19 under Dr. Tommi Rumps.    Postoperatively she was sent to the cardiac ICU for recovery on mechanical ventilation and PCCM was asked to evaluate for ICU care.  Pertinent Medical History:   Past Medical History:  Diagnosis Date   Anxiety    Arthritis    "right knee" (05/26/2014)   Asthma    CHF (congestive heart failure) (HCC)    chronic diastolic heart failure - sees Dr. Aundra Dubin   Chronic lower back pain    Dysrhythmia    irregular heart beat   GERD (gastroesophageal reflux disease)    HLD (hyperlipidemia)    Hypertension    Pneumonia    Stroke [facility])    TIA - ?2021   TIA (transient ischemic attack) 04/2018   Significant Hospital Events: Including procedures, antibiotic start and stop dates in addition to other pertinent events   3/19 MVR, AVR. 3/20 POD#1 from valve repair. Extubated ~0300. Slowly downtitrating pressors. Neo off, remains on Epi/NE. 3/21 POD#2. Bigeminy early AM with drop in CI to 1.4-1.6; Co-ox decreased to 49 from 60s. DDD Paced with significant improvement in index and Co-ox (61%). Epi weaned with drop in CI and pressures, increased back to 7. Diuresing BID. Echo with EF 30-35%, global hypokinesis,  moderate RV enlargement/dysfunction, s/p MVR. 3/22 POD#3, Pacing, weaning as able given septal dyssynchrony. CI improved 2.2, CVP 7 on Swan. Remains on milrinone 0.25, Epi 6, NE 8.    Interim History / Subjective:  No significant events overnight Pacing with improved Co-ox to 85 (52) CVP 7 on Swan, CI 2.2 Remains on milrinone/NE/Epi, weaning as able Lasix gtt today for volume overload, up 15lb Ca low 6.8,corrected 8.2  Objective:  Blood pressure 110/70, pulse 99, temperature 99 F (37.2 C), resp. rate (!) 21, height 5\' 6"  (1.676 m), weight 70.2 kg, SpO2 98 %. PAP: (33-63)/(16-36) 52/31 CVP:  [4 mmHg-23 mmHg] 17 mmHg CO:  [3.3 L/min-4 L/min] 4 L/min CI:  [1.9 L/min/m2-2.4 L/min/m2] 2.4 L/min/m2      Intake/Output Summary (Last 24 hours) at 05/10/2022 0809 Last data filed at 05/10/2022 0800 Gross per 24 hour  Intake 1218.04 ml  Output 2360 ml  Net -1141.96 ml    Filed Weights   05/08/22 0500 05/09/22 0500 05/10/22 0500  Weight: 68.5 kg 69.7 kg 70.2 kg   Physical Examination: General: Acute-on-chronically ill-appearing elderly woman in NAD. HEENT: [place]/AT, anicteric sclera, PERRL, moist mucous membranes. Neuro: Awake, oriented x 4. Responds to verbal stimuli. Following commands consistently. Moves all 4 extremities spontaneously. Strength 4/5 in all 4 extremities. CV: Tachycardic to 110s, regular rhythm, no m/g/r. Heart sounds distant. Chest: Midline sternotomy with dressing c/d/I, no erythema/drainage. PULM: Breathing even and unlabored on 3LNC. Lung fields diminished bilaterally. GI: Soft, nontender, nondistended. Normoactive bowel sounds. Extremities: No  significant LE edema noted. Skin: Warm/dry, no rashes.  Resolved Hospital Problem List:    Assessment & Plan:   Severe Mitral Regurgitation Moderate aortic regurgitation S/p open MVR, AVR 3/19  - POD#3 from AVR/MVR - Postoperative management per TCTS - CT management per protocol - Goal MAP > 65, goal CI > 2.0  (2.2) - Epi, Ne for post-pump vasoplegia, Neo off - Weaning pacing as able to limit septal dyssynchrony - Remains on amiodarone - Trend Co-ox - Periop abx prophylaxis - Pain control with morphine, oxy, tramadol   Post operative respiratory insufficiency, resolved Endotracheal tube present Hx asthma, emphysema Extubated 3/20AM ~0300 to 4LNC, tolerated well. - Supplemental O2 support as needed for O2 sat > 90% - Pulmonary hygiene -  Bronchodilators (Dulera), nebs PRN - Diuresis as below  Chronic HFrEF  GDMT held in the immediate postoperative setting. Echo with EF 30-35%, global hypokinesis, moderate RV enlargement/dysfunction, s/p MVR. - Trend CVP (7ish today 3/22) - Diuresis per HF team, Lasix gtt started 3/22 - Monitor I&Os - Continue milrinone  ?TIA History - ASA/statin  CKD at risk AKI post op, improved Hyponatremia Hypokalemia Hypocalcemia (corrected 8.2) - Trend BMP - Replete electrolytes as indicated - Monitor I&Os - Avoid nephrotoxic agents as able - Ensure adequate renal perfusion  Best Practice (right click and "Reselect all SmartList Selections" daily)   Diet/type: Regular consistency (see orders) DVT prophylaxis: LMWH GI prophylaxis: PPI Lines: Central line and Arterial Line Foley:  Yes, and it is still needed Code Status:  full code Last date of multidisciplinary goals of care discussion [Per Primary Team]  Critical care time: 34 minutes   The patient is critically ill with multiple organ system failure and requires high complexity decision making for assessment and support, frequent evaluation and titration of therapies, advanced monitoring, review of radiographic studies and interpretation of complex data.   Critical Care Time devoted to patient care services, exclusive of separately billable procedures, described in this note is 34 minutes.  Lestine Mount, PA-C [facility] 05/10/22 8:09 AM  Please see [email or web address] for  pager details.  From 7A-7P if no response, please call [phone] After hours, please call ELink [phone]

## 2022-05-10 NOTE — Progress Notes (Signed)
[address]       [place] [address]             [phone]      3 Days Post-Op  Procedure(s) (LRB): AORTIC VALVE REPLACEMENT (AVR) USING INSPIRIS VALVE SIZE 21MM (N/A) MITRAL VALVE (MV) REPLACEMENT USING MOSAIC 310 CINCH SIZE 31MM (N/A) TRANSESOPHAGEAL ECHOCARDIOGRAM (N/A)  Total Length of Stay:  LOS: 3 days   SUBJECTIVE: Pt reports was able to sleep somewhat  Overall feels well Couldn't move much on levo but epi lower CI improved  Vitals:   05/10/22 0630 05/10/22 0645  BP: 99/69   Pulse: (!) 101 (!) 101  Resp: 20 (!) 24  Temp: 99.3 F (37.4 C) 99.1 F (37.3 C)  SpO2: 98% 96%    Intake/Output      03/21 0701 03/22 0700   P.O. 480   I.V. (mL/kg) 785 (11.2)   Total Intake(mL/kg) 1265 (18)   Urine (mL/kg/hr) 2300 (1.4)   Stool 0   Chest Tube 10   Total Output 2310   Net -1045       Stool Occurrence 3 x       sodium chloride     sodium chloride     sodium chloride Stopped (05/08/22 0643)   epinephrine 6 mcg/min (05/10/22 0600)   lactated ringers     magnesium sulfate bolus IVPB 4 g (05/10/22 0624)   milrinone 0.25 mcg/kg/min (05/10/22 0600)   norepinephrine (LEVOPHED) Adult infusion 8 mcg/min (05/10/22 0600)    CBC    Component Value Date/Time   WBC 14.5 (H) 05/10/2022 0421   RBC 2.59 (L) 05/10/2022 0421   HGB 8.0 (L) 05/10/2022 0421   HGB 12.3 02/26/2022 1546   HCT 23.5 (L) 05/10/2022 0421   HCT 36.8 02/26/2022 1546   PLT 84 (L) 05/10/2022 0421   PLT 277 02/26/2022 1546   MCV 90.7 05/10/2022 0421   MCV 93 02/26/2022 1546   MCH 30.9 05/10/2022 0421   MCHC 34.0 05/10/2022 0421   RDW 14.9 05/10/2022 0421   RDW 14.0 02/26/2022 1546   LYMPHSABS 1.2 01/07/2022 1200   MONOABS 0.5 01/07/2022 1200   EOSABS 0.2 01/07/2022 1200   BASOSABS 0.0 01/07/2022 1200   CMP     Component Value Date/Time   NA 132 (L) 05/10/2022 0421   NA 143 02/26/2022 1546   K 3.5 05/10/2022 0421   CL 105 05/10/2022 0421   CO2 22 05/10/2022  0421   GLUCOSE 209 (H) 05/10/2022 0421   BUN 9 05/10/2022 0421   BUN 12 02/26/2022 1546   CREATININE 0.84 05/10/2022 0421   CALCIUM 6.8 (L) 05/10/2022 0421   PROT 6.7 05/03/2022 1100   PROT 6.8 02/26/2022 1546   ALBUMIN 3.4 (L) 05/03/2022 1100   ALBUMIN 4.4 02/26/2022 1546   AST 28 05/03/2022 1100   ALT 21 05/03/2022 1100   ALKPHOS 94 05/03/2022 1100   BILITOT 1.6 (H) 05/03/2022 1100   BILITOT 1.6 (H) 02/26/2022 1546   GFRNONAA >60 05/10/2022 0421   GFRAA 66 06/17/2019 1319   ABG    Component Value Date/Time   PHART 7.407 05/08/2022 1522   PCO2ART 35.4 05/08/2022 1522   PO2ART 101 05/08/2022 1522   HCO3 22.1 05/08/2022 1522   TCO2 23 05/08/2022 1522   ACIDBASEDEF 2.0 05/08/2022 1522   O2SAT 85.2 05/10/2022 0421   CBG (last 3)  Recent Labs    05/09/22 1141 05/09/22 1606 05/09/22 2114  GLUCAP 140* 140* 138*  Exam Lungs; decreased at bases Card: RR no murmur Incision: dressing intact Ext: warm little edema Neuro: alert no focal deficits   ASSESSMENT: POD #3 sp AVR/MVR Hemodynamics have improved with better CI/Coox but still on Levo/Epi/Milrinone Echo yesterday with EF 35% and no valvular issues. Get AHF team input on drips Now tachycardic above AAI pacing. Will go to [facility] and continue amiodarone Remove Pacing wires. With PLTs decreasing will check HIT and start NOAC tomorrow Renal: Cr stable. Will diurese today. Remove foley Pulm: on low oxygen demand. Encourage deep breathing. See if can ambulate Leave in unit today   Coralie Common, MD 05/10/2022

## 2022-05-10 NOTE — Progress Notes (Signed)
Patient ID: Shannon [name], female   DOB: [birth year], 72 y.o.   MRN: [id number] TCTS Evening Rounds:  Remains on milrinone 0.25, epi down to 4.5 and NE at 12.  MAP 80's. CI 2.2 PA 42/18, CVP 5. Rhythm is sinus tachy 110.  Good UO on lasix drip.  BMET pending this pm.

## 2022-05-10 NOTE — Progress Notes (Addendum)
Advanced Heart Failure Rounding Note  PCP-Cardiologist: Quay Burow, MD   Subjective:    S/p AVR/MVR.   POD # 3  On Epi 6, Milrinone 0.25, NE 8  Swan #s CVP 7 PAP 32/16 (22) CO 3.79 CI 2.2  PAPi 2.3  Co-ox 85%  SVR 1326  MAP 74   Remains 15 lb above pre-op wt  Good response to IV Lasix, 2.3L in UOP yesterday. SCr stable 0.84. K 3.5   WBC 17>>14 Hgb 9>>8  Plts 154 on admit>>84K today. HIT panel ordered.   Feels ok. No dyspnea. Chest sore. Appetite remains poor. Had BM  Post-op Echo EF 30-35%, RV mod reduced. Stable AV and MV prosthesis   Objective:   Weight Range: 70.2 kg Body mass index is 24.98 kg/m.   Vital Signs:   Temp:  [98.2 F (36.8 C)-99.7 F (37.6 C)] 99.1 F (37.3 C) (03/22 0645) Pulse Rate:  [94-101] 101 (03/22 0645) Resp:  [14-28] 24 (03/22 0645) BP: (70-129)/(54-93) 99/69 (03/22 0630) SpO2:  [89 %-100 %] 96 % (03/22 0645) Arterial Line BP: (76-178)/(43-91) 87/53 (03/22 0645) Weight:  [70.2 kg] 70.2 kg (03/22 0500) Last BM Date : 05/09/22  Weight change: Filed Weights   05/08/22 0500 05/09/22 0500 05/10/22 0500  Weight: 68.5 kg 69.7 kg 70.2 kg    Intake/Output:   Intake/Output Summary (Last 24 hours) at 05/10/2022 0754 Last data filed at 05/10/2022 0600 Gross per 24 hour  Intake 1264.96 ml  Output 2310 ml  Net -1045.04 ml      Physical Exam    CVP 7  General:  Well appearing. No resp difficulty HEENT: Normal Neck: Supple. JVP 9 cm. + IJ swan Carotids 2+ bilat; no bruits. No lymphadenopathy or thyromegaly appreciated. Cor: PMI nondisplaced. Regular rate & rhythm. No rubs, gallops or murmurs. + sternal dressing  Lungs: decreased BS at the bases  Abdomen: Soft, nontender, nondistended. No hepatosplenomegaly. No bruits or masses. Good bowel sounds. Extremities: No cyanosis, clubbing, rash, edema Neuro: Alert & orientedx3, cranial nerves grossly intact. moves all 4 extremities w/o difficulty. Affect pleasant   Telemetry    NSR 90s   EKG    No new EKG to review   Labs    CBC Recent Labs    05/09/22 0027 05/10/22 0421  WBC 17.8* 14.5*  HGB 9.0* 8.0*  HCT 27.3* 23.5*  MCV 91.3 90.7  PLT 97* 84*   Basic Metabolic Panel Recent Labs    05/09/22 0027 05/10/22 0421  NA 128* 132*  K 3.7 3.5  CL 101 105  CO2 22 22  GLUCOSE 138* 209*  BUN 13 9  CREATININE 0.98 0.84  CALCIUM 7.5* 6.8*  MG 2.2 1.5*  PHOS 2.9  --    Liver Function Tests No results for input(s): "AST", "ALT", "ALKPHOS", "BILITOT", "PROT", "ALBUMIN" in the last 72 hours. No results for input(s): "LIPASE", "AMYLASE" in the last 72 hours. Cardiac Enzymes No results for input(s): "CKTOTAL", "CKMB", "CKMBINDEX", "TROPONINI" in the last 72 hours.  BNP: BNP (last 3 results) Recent Labs    03/18/22 1540  BNP 58.1    ProBNP (last 3 results) Recent Labs    02/26/22 1546  PROBNP 2,865*     D-Dimer No results for input(s): "DDIMER" in the last 72 hours. Hemoglobin A1C No results for input(s): "HGBA1C" in the last 72 hours. Fasting Lipid Panel No results for input(s): "CHOL", "HDL", "LDLCALC", "TRIG", "CHOLHDL", "LDLDIRECT" in the last 72 hours. Thyroid Function Tests No results for  input(s): "TSH", "T4TOTAL", "T3FREE", "THYROIDAB" in the last 72 hours.  Invalid input(s): "FREET3"  Other results:   Imaging    ECHOCARDIOGRAM COMPLETE  Result Date: 05/09/2022    ECHOCARDIOGRAM REPORT   Patient Name:   Shannon [name] Date of Exam: 05/09/2022 Medical Rec #:  [id number]          Height:       66.0 in Accession #:    [id number]         Weight:       153.7 lb Date of Birth:  [birth year]          BSA:          1.788 m Patient Age:    47 years           BP:           118/63 mmHg Patient Gender: F                  HR:           98 bpm. Exam Location:  Inpatient Procedure: 2D Echo, Cardiac Doppler, Color Doppler and Intracardiac            Opacification Agent Indications:    S/P Aortic Valve replacement Z95.2, S/P Mitral Valve  replacement                 Z95.2  History:        Patient has prior history of Echocardiogram examinations. CHF,                 Stroke; Risk Factors:Hypertension and Dyslipidemia.                 Aortic Valve: 21 mm inspiris valve is present in the aortic                 position.  Sonographer:    Phineas [name] Referring Phys: [id number] Carrizo  1. Left ventricular ejection fraction, by estimation, is 30 to 35%. The left ventricle has moderately decreased function. The left ventricle demonstrates global hypokinesis. Left ventricular diastolic function could not be evaluated.  2. Right ventricular systolic function is moderately reduced. The right ventricular size is moderately enlarged.  3. Left atrial size was mildly dilated.  4. The mitral valve has been repaired/replaced. No evidence of mitral valve regurgitation. Mild mitral stenosis. The mean mitral valve gradient is 4.0 mmHg.  5. The aortic valve has been repaired/replaced. Aortic valve regurgitation is not visualized. No aortic stenosis is present. There is a 21 mm inspiris valve present in the aortic position. Aortic valve mean gradient measures 14.5 mmHg.  6. The inferior vena cava is normal in size with greater than 50% respiratory variability, suggesting right atrial pressure of 3 mmHg. FINDINGS  Left Ventricle: Left ventricular ejection fraction, by estimation, is 30 to 35%. The left ventricle has moderately decreased function. The left ventricle demonstrates global hypokinesis. Definity contrast agent was given IV to delineate the left ventricular endocardial borders. The left ventricular internal cavity size was normal in size. There is no left ventricular hypertrophy. Abnormal (paradoxical) septal motion consistent with post-operative status. Left ventricular diastolic function could  not be evaluated due to mitral valve repair. Left ventricular diastolic function could not be evaluated. Right Ventricle: The right ventricular size is  moderately enlarged. No increase in right ventricular wall thickness. Right ventricular systolic function is moderately reduced. Left Atrium: Left atrial size was mildly dilated. Right Atrium: Right atrial size  was normal in size. Pericardium: There is no evidence of pericardial effusion. Mitral Valve: The mitral valve has been repaired/replaced. No evidence of mitral valve regurgitation. Mild mitral valve stenosis. MV peak gradient, 8.2 mmHg. The mean mitral valve gradient is 4.0 mmHg. Tricuspid Valve: The tricuspid valve is normal in structure. Tricuspid valve regurgitation is mild . No evidence of tricuspid stenosis. Aortic Valve: The aortic valve has been repaired/replaced. Aortic valve regurgitation is not visualized. No aortic stenosis is present. Aortic valve mean gradient measures 14.5 mmHg. Aortic valve peak gradient measures 25.3 mmHg. Aortic valve area, by VTI measures 0.61 cm. There is a 21 mm inspiris valve present in the aortic position. Pulmonic Valve: The pulmonic valve was normal in structure. Pulmonic valve regurgitation is not visualized. No evidence of pulmonic stenosis. Aorta: The aortic root is normal in size and structure. Venous: The inferior vena cava is normal in size with greater than 50% respiratory variability, suggesting right atrial pressure of 3 mmHg. IAS/Shunts: No atrial level shunt detected by color flow Doppler.  LEFT VENTRICLE PLAX 2D LVIDd:         5.00 cm      Diastology LVIDs:         4.30 cm      LV e' medial:    5.33 cm/s LV PW:         1.20 cm      LV E/e' medial:  24.0 LV IVS:        1.00 cm      LV e' lateral:   6.31 cm/s LVOT diam:     1.70 cm      LV E/e' lateral: 20.3 LV SV:         22 LV SV Index:   12 LVOT Area:     2.27 cm  LV Volumes (MOD) LV vol d, MOD A2C: 130.0 ml LV vol d, MOD A4C: 126.0 ml LV vol s, MOD A2C: 77.1 ml LV vol s, MOD A4C: 86.3 ml LV SV MOD A2C:     52.9 ml LV SV MOD A4C:     126.0 ml LV SV MOD BP:      46.7 ml RIGHT VENTRICLE RV Basal diam:   4.20 cm RV S prime:     8.27 cm/s TAPSE (M-mode): 1.0 cm LEFT ATRIUM             Index        RIGHT ATRIUM           Index LA diam:        3.50 cm 1.96 cm/m   RA Area:     15.70 cm LA Vol (A2C):   81.5 ml 45.56 ml/m  RA Volume:   37.70 ml  21.09 ml/m LA Vol (A4C):   58.9 ml 32.94 ml/m LA Biplane Vol: 66.6 ml 37.25 ml/m  AORTIC VALVE AV Area (Vmax):    0.62 cm AV Area (Vmean):   0.60 cm AV Area (VTI):     0.61 cm AV Vmax:           251.50 cm/s AV Vmean:          174.000 cm/s AV VTI:            0.352 m AV Peak Grad:      25.3 mmHg AV Mean Grad:      14.5 mmHg LVOT Vmax:         69.00 cm/s LVOT Vmean:        46.100  cm/s LVOT VTI:          0.095 m LVOT/AV VTI ratio: 0.27  AORTA Ao Root diam: 3.60 cm MITRAL VALVE                TRICUSPID VALVE MV Area (PHT): 5.02 cm     TR Peak grad:   32.3 mmHg MV Area VTI:   0.79 cm     TR Vmax:        284.00 cm/s MV Peak grad:  8.2 mmHg MV Mean grad:  4.0 mmHg     SHUNTS MV Vmax:       1.43 m/s     Systemic VTI:  0.10 m MV Vmean:      93.9 cm/s    Systemic Diam: 1.70 cm MV Decel Time: 151 msec MV E velocity: 128.00 cm/s MV A velocity: 82.60 cm/s MV E/A ratio:  1.55 Kardie Tobb DO Electronically signed by Berniece Salines DO Signature Date/Time: 05/09/2022/5:07:26 PM    Final      Medications:     Scheduled Medications:  acetaminophen  1,000 mg Oral Q6H   Or   acetaminophen (TYLENOL) oral liquid 160 mg/5 mL  1,000 mg Per Tube Q6H   amiodarone  400 mg Oral BID   aspirin EC  81 mg Oral Daily   atorvastatin  80 mg Oral q1800   bisacodyl  10 mg Oral Daily   Or   bisacodyl  10 mg Rectal Daily   busPIRone  10 mg Oral BID   Chlorhexidine Gluconate Cloth  6 each Topical Daily   docusate sodium  200 mg Oral Daily   enoxaparin (LOVENOX) injection  40 mg Subcutaneous QHS   ezetimibe  10 mg Oral Daily   FLUoxetine  20 mg Oral Daily   fluticasone  1 spray Each Nare Daily   gabapentin  100 mg Oral BID   insulin aspart  0-24 Units Subcutaneous TID AC & HS    mometasone-formoterol  2 puff Inhalation BID   montelukast  10 mg Oral QHS   pantoprazole  40 mg Oral Daily   potassium chloride  20 mEq Oral Once   sodium chloride flush  3 mL Intravenous Q12H    Infusions:  sodium chloride     sodium chloride     sodium chloride Stopped (05/08/22 0643)   calcium gluconate     epinephrine 6 mcg/min (05/10/22 0600)   lactated ringers     magnesium sulfate bolus IVPB 4 g (05/10/22 0624)   milrinone 0.25 mcg/kg/min (05/10/22 0600)   norepinephrine (LEVOPHED) Adult infusion 8 mcg/min (05/10/22 0600)    PRN Medications: sodium chloride, albuterol, metoprolol tartrate, midazolam, morphine injection, ondansetron (ZOFRAN) IV, mouth rinse, oxyCODONE, sodium chloride flush, traMADol    Patient Profile   Shannon [name] is a 72 y.o. 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 AHF team consulted for management of post-cardiotomy shock.  Assessment/Plan   1. Valvular heart disease - s/p MVR/AVR 3/19 - post-op TEE: EF was 30-40% and the valves were well seated and the mitral had a mean of 90mmHg and the aortic had a mean of 18mmHg  - post-op TTE Post-op Echo EF 30-35%, RV mod reduced, prosthetic valves ok  - TCTS following - now extubated   Acute on chronic systolic CHF -  Echo in 0000000 showed EF 40-45%, mild RV dilation, moderate pulmonary HTN, mod-severe MR and moderate-severe TR.  -  RHC/LHC (1/24) with elevated left and right heart filling pressures, pulmonary venous hypertension, preserved cardiac output.  NICM. Cause is uncertain, ?valvular disease.  - Post-op Echo EF 30-35%, RV mod reduced. - On norepi 6 and epi 8 + milrinone 0.25  - co-ox 85%, CI 2.2, PAPi 2.3. CVP 7. Up 15 lb above pre-op  - continue diuresis w/ IV Lasix, 40 mg x 1 - wean pressors/inotropes as tolerated. Would wean down NE first.   - avoid BB for now - Will add GDMT once BP more stable - strict I&O,  daily weights   Pulmonary hypertension - Group 2 pulmonary venous hypertension.    Hyponatremia - Na 132 - Free water restriction, should improve with diuresis   H/o TIA/CVA - On plavix at home, anticoagulation per TCTS   CKD 3a, h/o AKI - SCr .84, stable. Baseline around 1.1 - monitor with diuresis - avoid hypotension - daily BMET    7. Thrombocytopenia - Plts 150 on admit>>84K today  - Check HIT panel     Length of Stay: 3  Shannon [name]  05/10/2022, 7:54 AM  Advanced Heart Failure Team Pager [phone] (M-F; 7a - 5p)  Please contact [facility] for night-coverage after hours (5p -7a ) and weekends on [email or web address]  Agree with above.   Remains on epi, NE and milrinone. Swan numbers improved. PAPI still low  Echo EF 35% with septal dysschrony and at least moderate RV dysfunction.   Weight up 15 pounds. No change with lasix 40 IV yesterday  Denies SOB. Feels weak   General:  Sitting up in bed  No resp difficulty HEENT: normal Neck: supple. RIJ swan Carotids 2+ bilat; no bruits. No lymphadenopathy or thryomegaly appreciated. Cor: Sternal dressing ok  Regular rate & rhythm. No rubs, gallops or murmurs. Lungs: decreased at bases Abdomen: soft, nontender, nondistended. No hepatosplenomegaly. No bruits or masses. Good bowel sounds. Extremities: no cyanosis, clubbing, rash, 1+ edema Neuro: alert & orientedx3, cranial nerves grossly intact. moves all 4 extremities w/o difficulty. Affect pleasant  Hemodynamics most c/w RV failure. Suspect she will have slow recovery post-op. Continue milrinone and NE. Wean epi slowly. Low dose lasix gtt. Ambulate. Will try to wean pacing given septal dyssynchrony.   CRITICAL CARE Performed by: Glori Bickers  Total critical care time: 45 minutes  Critical care time was exclusive of separately billable procedures and treating other patients.  Critical care was necessary to treat or prevent imminent or life-threatening  deterioration.  Critical care was time spent personally by me (independent of midlevel providers or residents) on the following activities: development of treatment plan with patient and/or surrogate as well as nursing, discussions with consultants, evaluation of patient's response to treatment, examination of patient, obtaining history from patient or surrogate, ordering and performing treatments and interventions, ordering and review of laboratory studies, ordering and review of radiographic studies, pulse oximetry and re-evaluation of patient's condition.  Glori Bickers, MD  9:22 AM

## 2022-05-10 NOTE — Discharge Summary (Addendum)
Physician Discharge Summary       [address]       [place] [address]             [phone]    Patient ID: Shannon [name] MRN: [id number] DOB/AGE: 1951/01/10 72 y.o.  Admit date: 05/07/2022 Discharge date: 05/20/2022  Admission Diagnoses:  Discharge Diagnoses:  Principal Problem:   S/P AVR (aortic valve replacement) Active Problems:   Hyponatremia   Acute gout of right ankle   Consults: pulmonary/intensive care and ID  Procedure (s): surgery   Larey Dresser, MD Demetrios Isaacs, FNP   History of Present Illness:     Pt is a 72 yo female who had a normal echo about 2 years ago. 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 Pt had an EF of 35%. Pt was sent for cath and found to have no CAD but elevated filling pressures and was admitted and diuresed. TEE and MRI performed have documented ongoing Cardiolmyopathy with MRI having EF of 18% and concern for severe AI and ongoing MR. Pt was felt secondary to her mitral pathology to not have a clip option. She has recently had bronchoscopy of mediastinal adenopathy without cancer and no mention of sarcoidosis. She has had esophageal dilation with improvement in her ability to swallow. Pt has had TIA of Left arm paresis and numbness that lasted for a week and resolved and has been on plavix without recurrence. Pt here for surgical consideration. She is overall doing well symptomatically with occasional DOE. No longer with lower ext edema. She reports her weight varies   The patient and all relevant studies were reviewed by Dr. Lavonna Monarch who recommended proceeding with surgical intervention.   Hospital course:  She was admitted this hospitalization electively and taken the operating room on 05/07/2022 at which time she underwent mitral valve  replacement/aortic valve replacement.  Additionally she had a maze procedure.  She tolerated the procedure well was taken to the surgical intensive care unit in stable condition.  The patient was extubated on POD #1.  She required inotropic support with Epinephrine and Levophed.  She developed ventricular ectopy.  Her Co-ox decreased despite pressors.  Advanced heart failure team was consulted.  Post operative Echocardiogram showed a reduced EF of 30-35%.  Findings were consistent with acute on chronic systolic CHF.  Additionally pulmonary hypertension was a component of findings.  There was also evidence of Biventricular failure.  They started the patient on Milrinone.  Her cardiac index and co-ox panel improved.  She was weaned slowly off Epinephrine and Levophed as hemodynamics allowed.  She is volume overloaded and did not respond well to IV Lasix.  She was started on low dose Lasix drip.  She has shown steady improvement in this regard.  She did develop AKI on top of chronic kidney disease stage IIIa however creatinine returned to normal.  Plans include advancing GDMT as able once off all pressors/inotropes.  Her chest tubes and pacing wires were removed without difficulty.  She developed worsening Thrombocytopenia and HIT panel was obtained.  This was in the normal range at 0.052.  Platelets have gradually returned to the normal range.  She does have an expected acute blood loss anemia and has required transfusion.  Cardiac rhythm has progressed over time from an accelerated  junctional to sinus.  She is on both amiodarone and digoxin.  The patient has been stable off all drips.  She is hypokalemic and and was supplemented accordingly.  The patient is maintaining NSR and has been started on Digoxin therapy.  The patient developed ankle pain which is felt to be related to Gout and uric acid level was obtained and was normal. The ankle pain improved.  He received a short course of colchicine which has  subsequently been discontinued.  The amiodarone was decreased to 200mg  twice daily after approximately 8gm loading over the course of several days. She maintained SR. She also progressed with mobility. She had an episode of coughing with vomiting on post-op day 12. Her abdominal exam was benign.  Symptoms improved quickly.  She did have somewhat frequent stooling and could potentially be related to the colchicine.  Heart failure team has detailed home medications at discharge from their perspective.  At the time of discharge the patient is overall felt to be quite stable.       Latest Vital Signs: Blood pressure 119/61, pulse 79, temperature 98.1 F (36.7 C), temperature source Oral, resp. rate 18, height 5\' 6"  (1.676 m), weight 62.5 kg, SpO2 99 %.  Physical Exam: General appearance: alert, cooperative, and no distress Heart: regular rate and rhythm Lungs: clear to auscultation bilaterally Abdomen: benign Extremities: no edema Wound: incis healing well   Discharge Condition:good  Recent laboratory studies:  Lab Results  Component Value Date   WBC 9.6 05/19/2022   HGB 8.2 (L) 05/19/2022   HCT 26.0 (L) 05/19/2022   MCV 92.2 05/19/2022   PLT 468 (H) 05/19/2022   Lab Results  Component Value Date   NA 136 05/20/2022   K 3.3 (L) 05/20/2022   CL 101 05/20/2022   CO2 25 05/20/2022   CREATININE 1.02 (H) 05/20/2022   GLUCOSE 101 (H) 05/20/2022      Diagnostic Studies: DG Chest 2 View  Result Date: 05/19/2022 CLINICAL DATA:  Pleural effusion EXAM: CHEST - 2 VIEW COMPARISON:  05/13/2022 FINDINGS: Cardiac shadow is stable. Postsurgical changes are noted. Right PICC is seen in satisfactory position. No focal infiltrate or sizable effusion is noted. No bony abnormality is seen. IMPRESSION: No active cardiopulmonary disease. Electronically Signed   By: Inez Catalina M.D.   On: 05/19/2022 10:40   DG CHEST PORT 1 VIEW  Result Date: 05/13/2022 CLINICAL DATA:  Acute on chronic congestive  heart failure. EXAM: PORTABLE CHEST 1 VIEW COMPARISON:  May 12, 2022 FINDINGS: Tortuosity and calcific atherosclerotic disease of the aorta. Postsurgical changes from aortic valve replacement. Right PICC line terminates at the expected location of the cavoatrial junction. Small bilateral pleural effusions, left greater than right. No definite airspace consolidation. Osseous structures are without acute abnormality. Soft tissues are grossly normal. IMPRESSION: 1. Small bilateral pleural effusions, left greater than right. 2. Tortuosity and calcific atherosclerotic disease of the aorta. Electronically Signed   By: Fidela Salisbury M.D.   On: 05/13/2022 09:10   [place] EKG SITE RITE  Result Date: 05/12/2022 If Site Rite image not attached, placement could not be confirmed due to current cardiac rhythm.  DG Chest Port 1 View  Result Date: 05/12/2022 CLINICAL DATA:  Status post AVR. EXAM: PORTABLE CHEST 1 VIEW COMPARISON:  05/10/2022 FINDINGS: Lungs are hyperexpanded. The cardio pericardial silhouette is enlarged. Interstitial markings are diffusely coarsened with chronic features. Vascular congestion noted without overt airspace pulmonary edema. Bibasilar atelectasis again noted, left greater than right with tiny bilateral  pleural effusions. Pulmonary artery catheter is been removed in the interval with right IJ sheath still in place. IMPRESSION: 1. Hyperexpansion with vascular congestion and tiny bilateral pleural effusions. 2. Bibasilar atelectasis, left greater than right. Electronically Signed   By: Misty Stanley M.D.   On: 05/12/2022 05:12   DG CHEST PORT 1 VIEW  Result Date: 05/10/2022 CLINICAL DATA:  Status post aortic valve replacement. EXAM: PORTABLE CHEST 1 VIEW COMPARISON:  Chest radiograph 05/09/2022 and earlier FINDINGS: Right IJ Swan-Ganz central venous catheter tip projects at the level of the proximal right pulmonary artery. Postoperative changes of median sternotomy, aortic valve and mitral  valve replacements. Stable cardiomegaly. Aortic calcifications. Persistent left basilar subsegmental atelectasis. Interval improvement of right basilar subsegmental atelectasis compared to yesterday's radiograph. Probable bilateral trace pleural effusions. No pneumothorax. IMPRESSION: 1. Persistent left basilar subsegmental atelectasis. Interval improvement of right basilar subsegmental atelectasis compared to yesterday's radiograph. 2. Probable bilateral trace pleural effusions. 3. Stable mild enlargement of the cardiac silhouette. Electronically Signed   By: Ileana Roup M.D.   On: 05/10/2022 09:05   ECHOCARDIOGRAM COMPLETE  Result Date: 05/09/2022    ECHOCARDIOGRAM REPORT   Patient Name:   Shannon [name] Date of Exam: 05/09/2022 Medical Rec #:  [id number]          Height:       66.0 in Accession #:    [id number]         Weight:       153.7 lb Date of Birth:  Nov 20, 1950          BSA:          1.788 m Patient Age:    49 years           BP:           118/63 mmHg Patient Gender: F                  HR:           98 bpm. Exam Location:  Inpatient Procedure: 2D Echo, Cardiac Doppler, Color Doppler and Intracardiac            Opacification Agent Indications:    S/P Aortic Valve replacement Z95.2, S/P Mitral Valve replacement                 Z95.2  History:        Patient has prior history of Echocardiogram examinations. CHF,                 Stroke; Risk Factors:Hypertension and Dyslipidemia.                 Aortic Valve: 21 mm inspiris valve is present in the aortic                 position.  Sonographer:    Phineas [name] Referring Phys: [id number] [place]  1. Left ventricular ejection fraction, by estimation, is 30 to 35%. The left ventricle has moderately decreased function. The left ventricle demonstrates global hypokinesis. Left ventricular diastolic function could not be evaluated.  2. Right ventricular systolic function is moderately reduced. The right ventricular size is moderately enlarged.   3. Left atrial size was mildly dilated.  4. The mitral valve has been repaired/replaced. No evidence of mitral valve regurgitation. Mild mitral stenosis. The mean mitral valve gradient is 4.0 mmHg.  5. The aortic valve has been repaired/replaced. Aortic valve regurgitation is not visualized. No aortic stenosis is present. There is  a 21 mm inspiris valve present in the aortic position. Aortic valve mean gradient measures 14.5 mmHg.  6. The inferior vena cava is normal in size with greater than 50% respiratory variability, suggesting right atrial pressure of 3 mmHg. FINDINGS  Left Ventricle: Left ventricular ejection fraction, by estimation, is 30 to 35%. The left ventricle has moderately decreased function. The left ventricle demonstrates global hypokinesis. Definity contrast agent was given IV to delineate the left ventricular endocardial borders. The left ventricular internal cavity size was normal in size. There is no left ventricular hypertrophy. Abnormal (paradoxical) septal motion consistent with post-operative status. Left ventricular diastolic function could  not be evaluated due to mitral valve repair. Left ventricular diastolic function could not be evaluated. Right Ventricle: The right ventricular size is moderately enlarged. No increase in right ventricular wall thickness. Right ventricular systolic function is moderately reduced. Left Atrium: Left atrial size was mildly dilated. Right Atrium: Right atrial size was normal in size. Pericardium: There is no evidence of pericardial effusion. Mitral Valve: The mitral valve has been repaired/replaced. No evidence of mitral valve regurgitation. Mild mitral valve stenosis. MV peak gradient, 8.2 mmHg. The mean mitral valve gradient is 4.0 mmHg. Tricuspid Valve: The tricuspid valve is normal in structure. Tricuspid valve regurgitation is mild . No evidence of tricuspid stenosis. Aortic Valve: The aortic valve has been repaired/replaced. Aortic valve regurgitation  is not visualized. No aortic stenosis is present. Aortic valve mean gradient measures 14.5 mmHg. Aortic valve peak gradient measures 25.3 mmHg. Aortic valve area, by VTI measures 0.61 cm. There is a 21 mm inspiris valve present in the aortic position. Pulmonic Valve: The pulmonic valve was normal in structure. Pulmonic valve regurgitation is not visualized. No evidence of pulmonic stenosis. Aorta: The aortic root is normal in size and structure. Venous: The inferior vena cava is normal in size with greater than 50% respiratory variability, suggesting right atrial pressure of 3 mmHg. IAS/Shunts: No atrial level shunt detected by color flow Doppler.  LEFT VENTRICLE PLAX 2D LVIDd:         5.00 cm      Diastology LVIDs:         4.30 cm      LV e' medial:    5.33 cm/s LV PW:         1.20 cm      LV E/e' medial:  24.0 LV IVS:        1.00 cm      LV e' lateral:   6.31 cm/s LVOT diam:     1.70 cm      LV E/e' lateral: 20.3 LV SV:         22 LV SV Index:   12 LVOT Area:     2.27 cm  LV Volumes (MOD) LV vol d, MOD A2C: 130.0 ml LV vol d, MOD A4C: 126.0 ml LV vol s, MOD A2C: 77.1 ml LV vol s, MOD A4C: 86.3 ml LV SV MOD A2C:     52.9 ml LV SV MOD A4C:     126.0 ml LV SV MOD BP:      46.7 ml RIGHT VENTRICLE RV Basal diam:  4.20 cm RV S prime:     8.27 cm/s TAPSE (M-mode): 1.0 cm LEFT ATRIUM             Index        RIGHT ATRIUM           Index LA diam:  3.50 cm 1.96 cm/m   RA Area:     15.70 cm LA Vol (A2C):   81.5 ml 45.56 ml/m  RA Volume:   37.70 ml  21.09 ml/m LA Vol (A4C):   58.9 ml 32.94 ml/m LA Biplane Vol: 66.6 ml 37.25 ml/m  AORTIC VALVE AV Area (Vmax):    0.62 cm AV Area (Vmean):   0.60 cm AV Area (VTI):     0.61 cm AV Vmax:           251.50 cm/s AV Vmean:          174.000 cm/s AV VTI:            0.352 m AV Peak Grad:      25.3 mmHg AV Mean Grad:      14.5 mmHg LVOT Vmax:         69.00 cm/s LVOT Vmean:        46.100 cm/s LVOT VTI:          0.095 m LVOT/AV VTI ratio: 0.27  AORTA Ao Root diam: 3.60 cm  MITRAL VALVE                TRICUSPID VALVE MV Area (PHT): 5.02 cm     TR Peak grad:   32.3 mmHg MV Area VTI:   0.79 cm     TR Vmax:        284.00 cm/s MV Peak grad:  8.2 mmHg MV Mean grad:  4.0 mmHg     SHUNTS MV Vmax:       1.43 m/s     Systemic VTI:  0.10 m MV Vmean:      93.9 cm/s    Systemic Diam: 1.70 cm MV Decel Time: 151 msec MV E velocity: 128.00 cm/s MV A velocity: 82.60 cm/s MV E/A ratio:  1.55 Kardie Tobb DO Electronically signed by Berniece Salines DO Signature Date/Time: 05/09/2022/5:07:26 PM    Final    DG Chest Port 1 View  Result Date: 05/09/2022 CLINICAL DATA:  Status post aortic valve repair. EXAM: PORTABLE CHEST 1 VIEW COMPARISON:  May 08, 2022. FINDINGS: Stable cardiomegaly. Right internal jugular Swan-Ganz catheter is unchanged. No pneumothorax is noted. Mild bibasilar subsegmental atelectasis is noted with small pleural effusions. Bony thorax is unremarkable. IMPRESSION: Mild bibasilar subsegmental atelectasis with small bilateral pleural effusions. Electronically Signed   By: Marijo Conception M.D.   On: 05/09/2022 08:31   DG Chest Port 1 View  Result Date: 05/08/2022 CLINICAL DATA:  Post AVR EXAM: PORTABLE CHEST 1 VIEW COMPARISON:  Portable exam 0533 hours compared to 05/07/2022 FINDINGS: Interval removal of endotracheal and nasogastric tubes. Mediastinal drains and RIGHT jugular Swan-Ganz catheter unchanged. Borderline enlargement of cardiac silhouette post AVR and MVR. Epicardial pacing wires present. Small bibasilar pleural effusions and atelectasis greater on LEFT. Question underlying emphysematous changes. No pneumothorax. Atherosclerotic calcification aorta. IMPRESSION: Bibasilar effusions and atelectasis greater on LEFT. Aortic Atherosclerosis (ICD10-I70.0). Electronically Signed   By: Lavonia Dana M.D.   On: 05/08/2022 08:14   ECHO INTRAOPERATIVE TEE  Result Date: 05/07/2022  *INTRAOPERATIVE TRANSESOPHAGEAL REPORT *  Patient Name:   Shannon [name] Date of Exam: 05/07/2022  Medical Rec #:  [id number]          Height:       66.0 in Accession #:    [id number]         Weight:       139.0 lb Date of Birth:  [birth year]  BSA:          1.71 m Patient Age:    71 years           BP:           117/63 mmHg Patient Gender: F                  HR:           71 bpm. Exam Location:  Inpatient Transesophogeal exam was perform intraoperatively during surgical procedure. Patient was closely monitored under general anesthesia during the entirety of examination. Indications:     mitral, aortic and tricuspid regurgitation Sonographer:     Johny Chess RDCS Performing Phys: [id number] Coralie Common Diagnosing Phys: Annye Asa MD Complications: No known complications during this procedure. POST-OP IMPRESSIONS Limited Post CPB exam: The patient separated easily from CPB on Epinephrine and phenylephrine infusions.  _ Left Ventricle: The left ventricle has severely reduced systolic function. There is septal flattening, consistent with pacing. There is global hypokinesis. The cavity size was mildly dilated. Overall EF measures 30%. _ Right Ventricle: The Right ventricle has mildly reduced function. The cavity size was normal. _ Aortic Valve: A bioprosthetic valve was placed in the aortic position, leaflets are freely mobile. There is no perivalvular leak, no insufficiency, and no stenosis. Peak gradient 16 mmHg, mean gradient 6 mmHg. _ Mitral Valve: A bioprosthetic valve was placed, leaflets are freely mobile. There is no perivalvular leak, no MR, and no mitral stenosis. Peak gradient 3 mmHg, mean gradient 2 mmHg. _ Tricuspid Valve: There was severe TR on initial separation from CPB. This improved to mild with time and Inotropy. PRE-OP FINDINGS  Left Ventricle: The left ventricle has mildly reduced systolic function, with an ejection fraction of 45-50%, measured 50%. The cavity size was normal. Left ventrical mild global hypokinesis without regional wall motion abnormalities. There is no left  ventricular hypertrophy. Left ventricular diastolic function was not evaluated. Right Ventricle: The right ventricle has normal systolic function. The cavity was normal. There is no increase in right ventricular wall thickness. Catheter present in the right ventricle. Left Atrium: Left atrial size was mildly dilated. No left atrial/left atrial appendage thrombus was detected. Left atrial appendage velocity is normal at greater than 40 cm/s. Right Atrium: Right atrial size was normal in size. Catheter present in the right atrium. Interatrial Septum: No atrial level shunt detected by color flow Doppler. There is no evidence of a patent foramen ovale. Pericardium: There is no evidence of pericardial effusion. Mitral Valve: The mitral valve is rheumatic. Mitral valve regurgitation is moderate by color flow Doppler (mild with legs supine, mod with legs elevated). The MR jet is centrally-directed. There is no evidence of mitral valve vegetation. Pulmonary venous  flow is blunted (decreased), and without flow reversal. There is no evidence of mitral stenosis with peak gradient 4 mmHg, mean gradient 2 mmHg. Tricuspid Valve: The tricuspid valve was normal in structure. Tricuspid valve regurgitation is mild by color flow Doppler. The jet is directed centrally. No evidence of tricuspid stenosis is present. There is no evidence of tricuspid valve vegetation. Aortic Valve: The aortic valve is tricuspid. Aortic valve regurgitation is moderate by color flow Doppler. The jet is centrally-directed. There is no stenosis of the aortic valve, with peak gradient 9 mmHg, mean gradient 5 mmHg. There is no evidence of aortic valve vegetation. There are moderately thickened leaflet edges present on the aortic valve non-coronary, left coronary and right coronary cusps with normal mobility.  Pulmonic Valve: The pulmonic valve was normal in structure, with normal leaflet mobility. No evidence of pulmonic stenosis. Pulmonic valve regurgitation  is trivial by color flow Doppler. Aorta: The aortic root, ascending aorta and aortic arch are normal in size and structure. There is evidence of plaque in the descending aorta; Grade I, measuring 1-81mm in size. Pulmonary Artery: Gordy Councilman catheter present on the right. The pulmonary artery is of normal size. Venous: The inferior vena cava is normal in size with greater than 50% respiratory variability, suggesting right atrial pressure of 3 mmHg. Shunts: There is no evidence of an atrial septal defect. +-------------+--------++ AORTIC VALVE          +-------------+--------++ AV Mean Grad:5.0 mmHg +-------------+--------++ +-------------+--------++ MITRAL VALVE          +-------------+--------++ MV Mean grad:2.0 mmHg +-------------+--------++  Annye Asa MD Electronically signed by Annye Asa MD Signature Date/Time: 05/07/2022/4:16:21 PM    Final    DG Chest Port 1 View  Result Date: 05/07/2022 CLINICAL DATA:  Status post AVR and MVR EXAM: PORTABLE CHEST 1 VIEW COMPARISON:  Preop x-ray 05/03/2022 FINDINGS: Status post median sternotomy with prosthetic valves. ET tube seen in place with tip 3 cm above the carina proximally. Enteric tube with tip extending beneath the diaphragm. Mediastinal drains. Right IJ line with tip along the main pulmonary artery. Enlarged cardiopericardial silhouette with vascular congestion. Calcified aorta. Trace pleural fluid. No pneumothorax clearly seen. IMPRESSION: Postop chest with numerous tubes and lines. Enlarged heart with vascular congestion. Trace effusions. No pneumothorax Electronically Signed   By: Jill Side M.D.   On: 05/07/2022 13:36   EP STUDY  Result Date: 05/07/2022 See surgical note for result.  DG Chest 2 View  Result Date: 05/06/2022 CLINICAL DATA:  72 year old female with history of preoperative chest x-ray EXAM: CHEST - 2 VIEW COMPARISON:  01/23/2022 FINDINGS: Cardiomediastinal silhouette unchanged. No interlobular septal  thickening. Interval resolution of the prior multifocal airspace and interstitial opacities of the lungs. No pneumothorax or pleural effusion. No new airspace disease. Degenerative changes of the spine. IMPRESSION: Interval resolution of multifocal infection, with no evidence on the current of acute cardiopulmonary disease Electronically Signed   By: Corrie Mckusick D.O.   On: 05/06/2022 08:21   VAS US CAROTID  Result Date: 05/03/2022 Carotid Arterial Duplex Study Patient Name:  Shannon [name]  Date of Exam:   05/03/2022 Medical Rec #: [id number]           Accession #:    [id number] Date of Birth: [birth year]           Patient Gender: F Patient Age:   9 years Exam Location:  [facility] Procedure:      VAS US CAROTID Referring Phys: Coralie Common --------------------------------------------------------------------------------  Indications:      MVR surgery. Risk Factors:     Hypertension, hyperlipidemia, coronary artery disease, prior                   CVA. Comparison Study: No priors. Performing Technologist: Oda Cogan RDMS, RVT  Examination Guidelines: A complete evaluation includes B-mode imaging, spectral Doppler, color Doppler, and power Doppler as needed of all accessible portions of each vessel. Bilateral testing is considered an integral part of a complete examination. Limited examinations for reoccurring indications may be performed as noted.  Right Carotid Findings: +----------+--------+--------+--------+------------------+------------------+           PSV cm/sEDV cm/sStenosisPlaque DescriptionComments           +----------+--------+--------+--------+------------------+------------------+ CCA  Prox  52      15                                                   +----------+--------+--------+--------+------------------+------------------+ CCA Distal59      17                                intimal thickening  +----------+--------+--------+--------+------------------+------------------+ ICA Prox  59      19      1-39%                     intimal thickening +----------+--------+--------+--------+------------------+------------------+ ICA Distal56      21                                                   +----------+--------+--------+--------+------------------+------------------+ ECA       69      9                                                    +----------+--------+--------+--------+------------------+------------------+ +----------+--------+-------+----------------+-------------------+           PSV cm/sEDV cmsDescribe        Arm Pressure (mmHG) +----------+--------+-------+----------------+-------------------+ [id number]            Multiphasic, WNL                    +----------+--------+-------+----------------+-------------------+ +---------+--------+--+--------+--+---------+ VertebralPSV cm/s45EDV cm/s16Antegrade +---------+--------+--+--------+--+---------+  Left Carotid Findings: +----------+--------+--------+--------+------------------+------------------+           PSV cm/sEDV cm/sStenosisPlaque DescriptionComments           +----------+--------+--------+--------+------------------+------------------+ CCA Prox  85      21                                                   +----------+--------+--------+--------+------------------+------------------+ CCA Distal46      14                                intimal thickening +----------+--------+--------+--------+------------------+------------------+ ICA Prox  45      21      1-39%                     intimal thickening +----------+--------+--------+--------+------------------+------------------+ ICA Mid   50      20                                                   +----------+--------+--------+--------+------------------+------------------+ ICA Distal80      30                                                    +----------+--------+--------+--------+------------------+------------------+  ECA       60      7                                                    +----------+--------+--------+--------+------------------+------------------+ +----------+--------+--------+----------------+-------------------+           PSV cm/sEDV cm/sDescribe        Arm Pressure (mmHG) +----------+--------+--------+----------------+-------------------+ Subclavian140             Multiphasic, WNL                    +----------+--------+--------+----------------+-------------------+ +---------+--------+--+--------+--+---------+ VertebralPSV cm/s46EDV cm/s14Antegrade +---------+--------+--+--------+--+---------+   Summary: Right Carotid: Velocities in the right ICA are consistent with a 1-39% stenosis. Left Carotid: Velocities in the left ICA are consistent with a 1-39% stenosis. Vertebrals:  Bilateral vertebral arteries demonstrate antegrade flow. Subclavians: Normal flow hemodynamics were seen in bilateral subclavian              arteries. *See table(s) above for measurements and observations.  Electronically signed by Servando Snare MD on 05/03/2022 at 10:42:14 AM.    Final     Results for orders placed or performed during the hospital encounter of 05/07/22 (from the past 48 hour(s))  Glucose, capillary     Status: None   Collection Time: 05/18/22  5:10 PM  Result Value Ref Range   Glucose-Capillary 87 70 - 99 mg/dL    Comment: Glucose reference range applies only to samples taken after fasting for at least 8 hours.  Glucose, capillary     Status: Abnormal   Collection Time: 05/18/22  9:14 PM  Result Value Ref Range   Glucose-Capillary 106 (H) 70 - 99 mg/dL    Comment: Glucose reference range applies only to samples taken after fasting for at least 8 hours.   Comment 1 Notify RN    Comment 2 Document in Chart   Cooxemetry Panel (carboxy, met, total hgb, O2 sat)     Status: Abnormal    Collection Time: 05/19/22  2:50 AM  Result Value Ref Range   Total hemoglobin 8.7 (L) 12.0 - 16.0 g/dL   O2 Saturation 66.8 %   Carboxyhemoglobin 2.1 (H) 0.5 - 1.5 %   Methemoglobin <0.7 0.0 - 1.5 %    Comment: Performed at [facility], [place] [address], [place], [place] [address]  CBC     Status: Abnormal   Collection Time: 05/19/22  2:55 AM  Result Value Ref Range   WBC 9.6 4.0 - 10.5 K/uL   RBC 2.82 (L) 3.87 - 5.11 MIL/uL   Hemoglobin 8.2 (L) 12.0 - 15.0 g/dL   HCT 26.0 (L) 36.0 - 46.0 %   MCV 92.2 80.0 - 100.0 fL   MCH 29.1 26.0 - 34.0 pg   MCHC 31.5 30.0 - 36.0 g/dL   RDW 15.6 (H) 11.5 - 15.5 %   Platelets 468 (H) 150 - 400 K/uL   nRBC 0.0 0.0 - 0.2 %    Comment: Performed at [facility] [address], [place], [place] [id number]  Basic metabolic panel     Status: Abnormal   Collection Time: 05/19/22  2:55 AM  Result Value Ref Range   Sodium 135 135 - 145 mmol/L   Potassium 4.0 3.5 - 5.1 mmol/L   Chloride 102 98 - 111 mmol/L  CO2 24 22 - 32 mmol/L   Glucose, Bld 93 70 - 99 mg/dL    Comment: Glucose reference range applies only to samples taken after fasting for at least 8 hours.   BUN 8 8 - 23 mg/dL   Creatinine, Ser 1.09 (H) 0.44 - 1.00 mg/dL   Calcium 8.3 (L) 8.9 - 10.3 mg/dL   GFR, Estimated 54 (L) >60 mL/min    Comment: (NOTE) Calculated using the CKD-EPI Creatinine Equation (2021)    Anion gap 9 5 - 15    Comment: Performed at [facility] [address] [place], [place] [address]  Glucose, capillary     Status: None   Collection Time: 05/19/22  6:07 AM  Result Value Ref Range   Glucose-Capillary 93 70 - 99 mg/dL    Comment: Glucose reference range applies only to samples taken after fasting for at least 8 hours.   Comment 1 Notify RN    Comment 2 Document in Chart   Glucose, capillary     Status: Abnormal   Collection Time: 05/19/22 12:13 PM  Result Value Ref Range   Glucose-Capillary 103 (H) 70 - 99 mg/dL    Comment: Glucose  reference range applies only to samples taken after fasting for at least 8 hours.  Glucose, capillary     Status: Abnormal   Collection Time: 05/19/22  3:56 PM  Result Value Ref Range   Glucose-Capillary 109 (H) 70 - 99 mg/dL    Comment: Glucose reference range applies only to samples taken after fasting for at least 8 hours.  Glucose, capillary     Status: Abnormal   Collection Time: 05/19/22  9:11 PM  Result Value Ref Range   Glucose-Capillary 101 (H) 70 - 99 mg/dL    Comment: Glucose reference range applies only to samples taken after fasting for at least 8 hours.  Cooxemetry Panel (carboxy, met, total hgb, O2 sat)     Status: Abnormal   Collection Time: 05/20/22  3:05 AM  Result Value Ref Range   Total hemoglobin 8.8 (L) 12.0 - 16.0 g/dL   O2 Saturation 66.7 %   Carboxyhemoglobin 4.0 (H) 0.5 - 1.5 %   Methemoglobin <0.7 0.0 - 1.5 %    Comment: Performed at [facility] [address], [place], [place] [id number]  Basic metabolic panel     Status: Abnormal   Collection Time: 05/20/22  3:05 AM  Result Value Ref Range   Sodium 136 135 - 145 mmol/L   Potassium 3.3 (L) 3.5 - 5.1 mmol/L   Chloride 101 98 - 111 mmol/L   CO2 25 22 - 32 mmol/L   Glucose, Bld 101 (H) 70 - 99 mg/dL    Comment: Glucose reference range applies only to samples taken after fasting for at least 8 hours.   BUN 7 (L) 8 - 23 mg/dL   Creatinine, Ser 1.02 (H) 0.44 - 1.00 mg/dL   Calcium 8.7 (L) 8.9 - 10.3 mg/dL   GFR, Estimated 58 (L) >60 mL/min    Comment: (NOTE) Calculated using the CKD-EPI Creatinine Equation (2021)    Anion gap 10 5 - 15    Comment: Performed at [facility] [address], [place], [place] [address]  Glucose, capillary     Status: None   Collection Time: 05/20/22  6:43 AM  Result Value Ref Range   Glucose-Capillary 89 70 - 99 mg/dL    Comment: Glucose reference range applies only to samples taken  after fasting for at least 8 hours.   Comment 1 Notify RN    Comment 2  Document in Chart   Glucose, capillary     Status: Abnormal   Collection Time: 05/20/22 12:03 PM  Result Value Ref Range   Glucose-Capillary 113 (H) 70 - 99 mg/dL    Comment: Glucose reference range applies only to samples taken after fasting for at least 8 hours.      Discharge Instructions     Amb Referral to Cardiac Rehabilitation   Complete by: As directed    Diagnosis: Valve Replacement   Valve: Aortic Comment - 05/07/22   After initial evaluation and assessments completed: Virtual Based Care may be provided alone or in conjunction with Phase 2 Cardiac Rehab based on patient barriers.: Yes   Intensive Cardiac Rehabilitation (ICR) [facility] location only OR Traditional Cardiac Rehabilitation (TCR) *If criteria for ICR are not met will enroll in TCR [facility] only): Yes   Discharge patient   Complete by: As directed    Discharge disposition: 01-Home or Self Care   Discharge patient date: 05/20/2022       Discharge Medications: Allergies as of 05/20/2022       Reactions   Amoxicillin-pot Clavulanate Itching   Lactose Intolerance (gi) Diarrhea   Other Other (See Comments)   08/06/2018 -    06/05/2016 -    09/15/2014 -    06/16/2014 -        Medication List     STOP taking these medications    carvedilol 3.125 MG tablet Commonly known as: COREG   clopidogrel 75 MG tablet Commonly known as: PLAVIX   losartan 25 MG tablet Commonly known as: COZAAR   metoprolol succinate 25 MG 24 hr tablet Commonly known as: Toprol XL   torsemide 20 MG tablet Commonly known as: DEMADEX       TAKE these medications    albuterol (2.5 MG/3ML) 0.083% nebulizer solution Commonly known as: PROVENTIL Take 3 mLs (2.5 mg total) by nebulization every 6 (six) hours as needed for wheezing or shortness of breath. What changed: Another medication with the same name was removed. Continue taking this medication, and follow the directions you see here.   amiodarone 200 MG tablet Commonly known as:  PACERONE Take 1 tablet (200 mg total) by mouth 2 (two) times daily.   apixaban 5 MG Tabs tablet Commonly known as: ELIQUIS Take 1 tablet (5 mg total) by mouth 2 (two) times daily.   aspirin EC 81 MG tablet Take 1 tablet (81 mg total) by mouth daily. Swallow whole. Start taking on: May 21, 2022   atorvastatin 80 MG tablet Commonly known as: LIPITOR Take 1 tablet (80 mg total) by mouth daily at 6 PM.   busPIRone 10 MG tablet Commonly known as: BUSPAR Take 10 mg by mouth 2 (two) times daily.   dapagliflozin propanediol 10 MG Tabs tablet Commonly known as: Farxiga Take 1 tablet (10 mg total) by mouth daily.   desloratadine 5 MG tablet Commonly known as: CLARINEX Take 5 mg by mouth daily.   digoxin 0.125 MG tablet Commonly known as: LANOXIN Take 1 tablet (0.125 mg total) by mouth daily. Start taking on: May 21, 2022   ezetimibe 10 MG tablet Commonly known as: ZETIA Take 10 mg by mouth daily.   Fe Fum-Vit C-Vit B12-FA Caps capsule Commonly known as: TRIGELS-F FORTE Take 1 capsule by mouth daily after breakfast. Start taking on: May 21, 2022   FLUoxetine 20 MG capsule  Commonly known as: PROZAC Take 20 mg by mouth daily.   fluticasone 50 MCG/ACT nasal spray Commonly known as: FLONASE Place 1 spray into both nostrils daily.   fluticasone-salmeterol 500-50 MCG/ACT Aepb Commonly known as: Wixela Inhub Inhale 1 puff into the lungs in the morning and at bedtime.   furosemide 40 MG tablet Commonly known as: LASIX Take 1 tablet (40 mg total) by mouth daily. Start taking on: May 21, 2022   montelukast 10 MG tablet Commonly known as: SINGULAIR Take 1 tablet (10 mg total) by mouth at bedtime.   oxyCODONE 5 MG immediate release tablet Commonly known as: Oxy IR/ROXICODONE Take 1 tablet (5 mg total) by mouth every 6 (six) hours as needed for up to 7 days for severe pain.   pantoprazole 40 MG tablet Commonly known as: PROTONIX Take 1 tablet (40 mg total) by mouth  daily.   potassium chloride SA 20 MEQ tablet Commonly known as: KLOR-CON M Take 2 tablets (40 mEq total) by mouth daily. What changed:  how much to take when to take this   spironolactone 25 MG tablet Commonly known as: ALDACTONE Take 1/2 tablet (12.5 mg total) by mouth daily.   Vitamin D 50 MCG (2000 UT) tablet Take 2,000 Units by mouth daily.               Durable Medical Equipment  (From admission, onward)           Start     Ordered   05/20/22 1144  For home use only DME Bedside commode  Once       Question Answer Comment  Patient needs a bedside commode to treat with the following condition S/P AVR (aortic valve replacement)   Patient needs a bedside commode to treat with the following condition Heart failure      05/20/22 1144            Follow Up Appointments:  Follow-up Information     Coralie Common, MD Follow up.   Specialty: Cardiothoracic Surgery Why: Please see discharge paperwork for details of follow-up appointment with cardiac surgery. Contact information: [address]  [place] [address] [phone]         [facility] Follow up.   Why: On the date you were seen at surgeons office please obtain a chest x-ray at [facility] 1 hour prior to appointment. Contact information: [facility] [place]. Follow up.   Why: Home Health agency will call to arrange visits Contact information: [place] [address] [facility] Follow up on 06/04/2022.   Specialty: Cardiology Why: Follow up in the Advanced Heart Failure clinic 06/04/22 at 2pm Entrance C, free valet Contact information: [address] [id number] [place] [phone]                Signed: Gaspar Bidding 05/20/2022, 12:23 PM

## 2022-05-10 NOTE — Progress Notes (Signed)
Critical care attending attestation note:  Patient seen and examined and relevant ancillary tests reviewed.  I agree with the assessment and plan of care as outlined by Fonnie Mu, PA-C.   Synopsis of assessment and plan:  72 year old woman who remains critically ill due to cardiogenic shock following AVR MVR for requiring titration of inotropes and vasopressors.  She is extubated and on minimal oxygen.  She is doing well with her incentive spirometry and chest pain is well-controlled. She has not yet mobilized to the chair.  Cardiac index and SCV O2 remain marginal.  We have not been able to make much progress weaning vasoactive infusions.  Echocardiogram yesterday showed EF of 30 to 35% with RV dysfunction as well but well-seated prosthesis.  Cardiac filling pressures have been improving with diuresis.Marland Kitchen  -Slow cardiac recovery following surgery.  Wean vasoactive infusions as tolerated.  Wean norepinephrine to keep MAP greater than 65, wean epinephrine to keep cardiac index greater than 2. -Continue gentle diuresis given elevated filling pressures. -Progressive ambulation -Encourage oral intake.  CRITICAL CARE Performed by: Kipp Brood   Total critical care time: 34 minutes  Critical care time was exclusive of separately billable procedures and treating other patients.  Critical care was necessary to treat or prevent imminent or life-threatening deterioration.  Critical care was time spent personally by me on the following activities: development of treatment plan with patient and/or surrogate as well as nursing, discussions with consultants, evaluation of patient's response to treatment, examination of patient, obtaining history from patient or surrogate, ordering and performing treatments and interventions, ordering and review of laboratory studies, ordering and review of radiographic studies, pulse oximetry, re-evaluation of patient's condition and participation in  multidisciplinary rounds.  Kipp Brood, MD [facility] ICU Physician [place]  Pager: [phone] Mobile: [phone] After hours: [phone].  05/10/2022, 5:38 PM

## 2022-05-11 DIAGNOSIS — I5023 Acute on chronic systolic (congestive) heart failure: Secondary | ICD-10-CM | POA: Diagnosis not present

## 2022-05-11 DIAGNOSIS — N1831 Chronic kidney disease, stage 3a: Secondary | ICD-10-CM | POA: Diagnosis not present

## 2022-05-11 DIAGNOSIS — I342 Nonrheumatic mitral (valve) stenosis: Secondary | ICD-10-CM | POA: Diagnosis not present

## 2022-05-11 DIAGNOSIS — Z952 Presence of prosthetic heart valve: Secondary | ICD-10-CM | POA: Diagnosis not present

## 2022-05-11 DIAGNOSIS — E871 Hypo-osmolality and hyponatremia: Secondary | ICD-10-CM | POA: Diagnosis not present

## 2022-05-11 LAB — CBC
HCT: 26.2 % — ABNORMAL LOW (ref 36.0–46.0)
Hemoglobin: 8.7 g/dL — ABNORMAL LOW (ref 12.0–15.0)
MCH: 30.2 pg (ref 26.0–34.0)
MCHC: 33.2 g/dL (ref 30.0–36.0)
MCV: 91 fL (ref 80.0–100.0)
Platelets: 112 10*3/uL — ABNORMAL LOW (ref 150–400)
RBC: 2.88 MIL/uL — ABNORMAL LOW (ref 3.87–5.11)
RDW: 15 % (ref 11.5–15.5)
WBC: 12.4 10*3/uL — ABNORMAL HIGH (ref 4.0–10.5)
nRBC: 0 % (ref 0.0–0.2)

## 2022-05-11 LAB — TYPE AND SCREEN
ABO/RH(D): O POS
Antibody Screen: NEGATIVE
Unit division: 0
Unit division: 0
Unit division: 0
Unit division: 0
Unit division: 0
Unit division: 0
Unit division: 0
Unit division: 0

## 2022-05-11 LAB — MAGNESIUM
Magnesium: 1.4 mg/dL — ABNORMAL LOW (ref 1.7–2.4)
Magnesium: 1.4 mg/dL — ABNORMAL LOW (ref 1.7–2.4)

## 2022-05-11 LAB — BPAM RBC
Blood Product Expiration Date: 202404142359
Blood Product Expiration Date: 202404142359
Blood Product Expiration Date: 202404142359
Blood Product Expiration Date: 202404142359
Blood Product Expiration Date: 202404172359
Blood Product Expiration Date: 202404182359
Blood Product Expiration Date: 202404182359
Blood Product Expiration Date: 202404182359
ISSUE DATE / TIME: 202403190817
ISSUE DATE / TIME: 202403190817
ISSUE DATE / TIME: 202403190817
ISSUE DATE / TIME: 202403210356
Unit Type and Rh: 5100
Unit Type and Rh: 5100
Unit Type and Rh: 5100
Unit Type and Rh: 5100
Unit Type and Rh: 5100
Unit Type and Rh: 5100
Unit Type and Rh: 5100
Unit Type and Rh: 5100

## 2022-05-11 LAB — BASIC METABOLIC PANEL
Anion gap: 15 (ref 5–15)
Anion gap: 9 (ref 5–15)
BUN: 11 mg/dL (ref 8–23)
BUN: 10 mg/dL (ref 8–23)
CO2: 26 mmol/L (ref 22–32)
CO2: 29 mmol/L (ref 22–32)
Calcium: 8 mg/dL — ABNORMAL LOW (ref 8.9–10.3)
Calcium: 7.7 mg/dL — ABNORMAL LOW (ref 8.9–10.3)
Chloride: 96 mmol/L — ABNORMAL LOW (ref 98–111)
Chloride: 97 mmol/L — ABNORMAL LOW (ref 98–111)
Creatinine, Ser: 0.94 mg/dL (ref 0.44–1.00)
Creatinine, Ser: 0.92 mg/dL (ref 0.44–1.00)
GFR, Estimated: >60 mL/min (ref >60)
GFR, Estimated: >60 mL/min (ref >60)
Glucose, Bld: 135 mg/dL — ABNORMAL HIGH (ref 70–99)
Glucose, Bld: 152 mg/dL — ABNORMAL HIGH (ref 70–99)
Potassium: 3.2 mmol/L — ABNORMAL LOW (ref 3.5–5.1)
Potassium: 3.1 mmol/L — ABNORMAL LOW (ref 3.5–5.1)
Sodium: 137 mmol/L (ref 135–145)
Sodium: 135 mmol/L (ref 135–145)

## 2022-05-11 LAB — GLUCOSE, CAPILLARY
Glucose-Capillary: 147 mg/dL — ABNORMAL HIGH (ref 70–99)
Glucose-Capillary: 169 mg/dL — ABNORMAL HIGH (ref 70–99)
Glucose-Capillary: 105 mg/dL — ABNORMAL HIGH (ref 70–99)

## 2022-05-11 LAB — COOXEMETRY PANEL
Carboxyhemoglobin: 2 % — ABNORMAL HIGH (ref 0.5–1.5)
Methemoglobin: 1.8 % — ABNORMAL HIGH (ref 0.0–1.5)
O2 Saturation: 60.1 %
Total hemoglobin: 7.5 g/dL — ABNORMAL LOW (ref 12.0–16.0)

## 2022-05-11 LAB — PHOSPHORUS
Phosphorus: 2.6 mg/dL (ref 2.5–4.6)
Phosphorus: 2.6 mg/dL (ref 2.5–4.6)

## 2022-05-11 LAB — HEPARIN INDUCED PLATELET AB (HIT ANTIBODY): Heparin Induced Plt Ab: 0.052 OD (ref 0.000–0.400)

## 2022-05-11 MED ORDER — POTASSIUM CHLORIDE CRYS ER 20 MEQ PO TBCR
20.0000 meq | EXTENDED_RELEASE_TABLET | ORAL | Status: AC
  Administered 2022-05-11 (×3): 20 meq via ORAL
  Filled 2022-05-11 (×2): qty 1

## 2022-05-11 MED ORDER — MAGNESIUM SULFATE 2 GM/50ML IV SOLN
2.0000 g | Freq: Once | INTRAVENOUS | Status: AC
  Administered 2022-05-11: 2 g via INTRAVENOUS
  Filled 2022-05-11: qty 50

## 2022-05-11 MED ORDER — APIXABAN 5 MG PO TABS
5.0000 mg | ORAL_TABLET | Freq: Two times a day (BID) | ORAL | Status: DC
  Administered 2022-05-11 – 2022-05-20 (×19): 5 mg via ORAL
  Filled 2022-05-11 (×20): qty 1

## 2022-05-11 MED ORDER — AMIODARONE HCL 200 MG PO TABS
200.0000 mg | ORAL_TABLET | Freq: Two times a day (BID) | ORAL | Status: DC
  Administered 2022-05-11 – 2022-05-13 (×5): 200 mg via ORAL
  Filled 2022-05-11 (×5): qty 1

## 2022-05-11 MED ORDER — MAGNESIUM SULFATE 4 GM/100ML IV SOLN
4.0000 g | Freq: Once | INTRAVENOUS | Status: AC
  Administered 2022-05-11: 4 g via INTRAVENOUS
  Filled 2022-05-11: qty 100

## 2022-05-11 NOTE — Progress Notes (Signed)
Advanced Heart Failure Rounding Note  PCP-Cardiologist: Quay Burow, MD   Subjective:    S/p AVR/MVR. Post-op Echo EF 30-35%, RV mod reduced. Stable AV and MV prosthesis   POD # 4  On Epi 6 -> 5, Milrinone 0.25, NE 8 -> 17. Lasix gtt at 5. Weight down 8 pounds.   [place]wan #s CVP 5  PAP: (28-52)/(3-32) 36/15 CVP:  [0 mmHg-13 mmHg] 4 mmHg CO:  [4.1 L/min-4.3 L/min] 4.1 L/min CI:  [2.4 L/min/m2-2.5 L/min/m2] 2.4 L/min/m2 SVR 1243 PAPi 3-4    Objective:   Weight Range: 66.6 kg Body mass index is 23.7 kg/m.   Vital Signs:   Temp:  [96.6 F (35.9 C)-100.9 F (38.3 C)] 99 F (37.2 C) (03/23 0930) Pulse Rate:  [103-122] 115 (03/23 0930) Resp:  [0-38] 25 (03/23 0930) BP: (78-138)/(50-102) 110/65 (03/23 0930) SpO2:  [86 %-100 %] 99 % (03/23 0930) Arterial Line BP: (65-128)/(38-66) 114/55 (03/23 0930) Weight:  [66.6 kg] 66.6 kg (03/23 0500) Last BM Date : 05/10/22  Weight change: Filed Weights   05/09/22 0500 05/10/22 0500 05/11/22 0500  Weight: 69.7 kg 70.2 kg 66.6 kg    Intake/Output:   Intake/Output Summary (Last 24 hours) at 05/11/2022 0939 Last data filed at 05/11/2022 0900 Gross per 24 hour  Intake 1307.75 ml  Output 4585 ml  Net -3277.25 ml       Physical Exam    General:  Sitting up in bed No resp difficulty HEENT: normal Neck: supple.RIJ swan  Carotids 2+ bilat; no bruits. No lymphadenopathy or thryomegaly appreciated. Cor: Sternal dressing  Regular rtachy No rubs, gallops or murmurs. Lungs: clear Abdomen: soft, nontender, nondistended. No hepatosplenomegaly. No bruits or masses. Good bowel sounds. Extremities: no cyanosis, clubbing, rash, edema Neuro: alert & orientedx3, cranial nerves grossly intact. moves all 4 extremities w/o difficulty. Affect pleasant   Telemetry   Accelerated junctional versus sinus with long 1AVB  115-120 Personally reviewed   Labs    CBC Recent Labs    05/10/22 0421 05/11/22 0652  WBC 14.5* 12.4*  HGB 8.0*  8.7*  HCT 23.5* 26.2*  MCV 90.7 91.0  PLT 84* 112*    Basic Metabolic Panel Recent Labs    05/11/22 0652 05/11/22 0752  NA 137 135  K 3.2* 3.1*  CL 96* 97*  CO2 26 29  GLUCOSE 135* 152*  BUN 11 10  CREATININE 0.94 0.92  CALCIUM 8.0* 7.7*  MG 1.4* 1.4*  PHOS 2.6 2.6    Liver Function Tests Recent Labs    05/10/22 0421  ALBUMIN 1.8*   No results for input(s): "LIPASE", "AMYLASE" in the last 72 hours. Cardiac Enzymes No results for input(s): "CKTOTAL", "CKMB", "CKMBINDEX", "TROPONINI" in the last 72 hours.  BNP: BNP (last 3 results) Recent Labs    03/18/22 1540  BNP 58.1     ProBNP (last 3 results) Recent Labs    02/26/22 1546  PROBNP 2,865*      D-Dimer No results for input(s): "DDIMER" in the last 72 hours. Hemoglobin A1C No results for input(s): "HGBA1C" in the last 72 hours. Fasting Lipid Panel No results for input(s): "CHOL", "HDL", "LDLCALC", "TRIG", "CHOLHDL", "LDLDIRECT" in the last 72 hours. Thyroid Function Tests No results for input(s): "TSH", "T4TOTAL", "T3FREE", "THYROIDAB" in the last 72 hours.  Invalid input(s): "FREET3"  Other results:   Imaging    No results found.   Medications:     Scheduled Medications:  acetaminophen  1,000 mg Oral Q6H   Or  acetaminophen (TYLENOL) oral liquid 160 mg/5 mL  1,000 mg Per Tube Q6H   amiodarone  400 mg Oral BID   aspirin EC  81 mg Oral Daily   atorvastatin  80 mg Oral q1800   bisacodyl  10 mg Oral Daily   Or   bisacodyl  10 mg Rectal Daily   busPIRone  10 mg Oral BID   Chlorhexidine Gluconate Cloth  6 each Topical Daily   docusate sodium  200 mg Oral Daily   enoxaparin (LOVENOX) injection  40 mg Subcutaneous QHS   ezetimibe  10 mg Oral Daily   feeding supplement  237 mL Oral BID BM   FLUoxetine  20 mg Oral Daily   fluticasone  1 spray Each Nare Daily   gabapentin  100 mg Oral BID   insulin aspart  0-24 Units Subcutaneous TID AC & HS   mometasone-formoterol  2 puff Inhalation  BID   montelukast  10 mg Oral QHS   pantoprazole  40 mg Oral Daily   potassium chloride  20 mEq Oral Q4H   sodium chloride flush  3 mL Intravenous Q12H    Infusions:  sodium chloride     sodium chloride     sodium chloride 10 mL/hr at 05/10/22 1919   epinephrine 5 mcg/min (05/11/22 0800)   furosemide (LASIX) 200 mg in dextrose 5 % 100 mL (2 mg/mL) infusion 5 mg/hr (05/11/22 0800)   lactated ringers 20 mL/hr at 05/10/22 0925   milrinone 0.25 mcg/kg/min (05/11/22 0800)   norepinephrine (LEVOPHED) Adult infusion 17 mcg/min (05/11/22 0800)    PRN Medications: sodium chloride, albuterol, metoprolol tartrate, midazolam, morphine injection, ondansetron (ZOFRAN) IV, mouth rinse, oxyCODONE, sodium chloride flush, traMADol    Patient Profile   Shannon [name] is a 72 y.o. 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 AHF team consulted for management of post-cardiotomy shock.  Assessment/Plan   1. Valvular heart disease - s/p MVR/AVR 3/19 - post-op TEE: EF was 30-40% and the valves were well seated and the mitral had a mean of 41mmHg and the aortic had a mean of 32mmHg  - post-op TTE Post-op Echo EF 30-35%, RV mod reduced, prosthetic valves ok  - TCTS following - now extubated   Acute on chronic systolic CHF -  Echo in 0000000 showed EF 40-45%, mild RV dilation, moderate pulmonary HTN, mod-severe MR and moderate-severe TR.  - RHC/LHC (1/24) with elevated left and right heart filling pressures, pulmonary venous hypertension, preserved cardiac output.  NICM. Cause is uncertain, ?valvular disease.  - Post-op Echo EF 30-35%, RV mod reduced. - On norepi 6 -> 17  and epi 8 -> 5 + milrinone 0.25  - co-ox 60%.  - Volume status improved with lasix gtt but still 7 pounds over baseline  - Improving slowly but still struggling with RV failure post-op.  - hold lasix gtt - Will add GDMT once BP/output more stable - strict  I&O, daily weights   Pulmonary hypertension - Group 2 pulmonary venous hypertension.    Hyponatremia - Resolved  H/o TIA/CVA - On plavix at home, anticoagulation per TCTS   CKD 3a, h/o AKI - stable Scr 0.9  - avoid hypotension - daily BMET    7. Thrombocytopenia - Plts 150 on admit>>84K -> 112K  8. Hypokalemia/hypomag - supp  9. Accelerated junctional rhythm (vs sinus with long 1AVB) - decreased amio to 200 bid  CRITICAL CARE Performed by:  Markell Schrier  Total critical care time: 35 minutes  Critical care time was exclusive of separately billable procedures and treating other patients.  Critical care was necessary to treat or prevent imminent or life-threatening deterioration.  Critical care was time spent personally by me (independent of midlevel providers or residents) on the following activities: development of treatment plan with patient and/or surrogate as well as nursing, discussions with consultants, evaluation of patient's response to treatment, examination of patient, obtaining history from patient or surrogate, ordering and performing treatments and interventions, ordering and review of laboratory studies, ordering and review of radiographic studies, pulse oximetry and re-evaluation of patient's condition.    Length of Stay: [place], MD  05/11/2022, 9:39 AM  Advanced Heart Failure Team Pager [phone] (M-F; 7a - 5p)  Please contact [facility] for night-coverage after hours (5p -7a ) and weekends on [email or web address]

## 2022-05-11 NOTE — Progress Notes (Signed)
4 Days Post-Op Procedure(s) (LRB): AORTIC VALVE REPLACEMENT (AVR) USING INSPIRIS VALVE SIZE 21MM (N/A) MITRAL VALVE (MV) REPLACEMENT USING MOSAIC 310 CINCH SIZE 31MM (N/A) TRANSESOPHAGEAL ECHOCARDIOGRAM (N/A) Subjective: No complaints  Objective: Vital signs in last 24 hours: Temp:  [96.6 F (35.9 C)-100.9 F (38.3 C)] 99 F (37.2 C) (03/23 0930) Pulse Rate:  [103-122] 115 (03/23 0930) Cardiac Rhythm: Sinus tachycardia (03/23 0800) Resp:  [0-38] 25 (03/23 0930) BP: (78-138)/(50-102) 110/65 (03/23 0930) SpO2:  [86 %-100 %] 99 % (03/23 0930) Arterial Line BP: (65-128)/(38-66) 114/55 (03/23 0930) Weight:  [66.6 kg] 66.6 kg (03/23 0500)  Hemodynamic parameters for last 24 hours: PAP: (28-52)/(3-32) 36/15 CVP:  [0 mmHg-13 mmHg] 4 mmHg CO:  [4.1 L/min-4.3 L/min] 4.1 L/min CI:  [2.4 L/min/m2-2.5 L/min/m2] 2.4 L/min/m2  Intake/Output from previous day: 03/22 0701 - 03/23 0700 In: 1491.9 [P.O.:240; I.V.:962.1; IV Piggyback:289.8] Out: [id number] [Urine:5090] Intake/Output this shift: Total I/O In: 95.3 [I.V.:95.3] Out: 505 [Urine:505]  General appearance: alert and cooperative Neurologic: intact Heart: regular rate and rhythm, S1, S2 normal, no murmur Lungs: diminished breath sounds bibasilar Extremities: minimal edema Wound: incision healing well.  Lab Results: Recent Labs    05/10/22 0421 05/11/22 0652  WBC 14.5* 12.4*  HGB 8.0* 8.7*  HCT 23.5* 26.2*  PLT 84* 112*   BMET:  Recent Labs    05/11/22 0652 05/11/22 0752  NA 137 135  K 3.2* 3.1*  CL 96* 97*  CO2 26 29  GLUCOSE 135* 152*  BUN 11 10  CREATININE 0.94 0.92  CALCIUM 8.0* 7.7*    PT/INR:  Recent Labs    05/08/22 1526  LABPROT 17.0*  INR 1.4*   ABG    Component Value Date/Time   PHART 7.407 05/08/2022 1522   HCO3 22.1 05/08/2022 1522   TCO2 23 05/08/2022 1522   ACIDBASEDEF 2.0 05/08/2022 1522   O2SAT 60.1 05/11/2022 0752   CBG (last 3)  Recent Labs    05/10/22 2106 05/10/22 2119  05/11/22 0758  GLUCAP 148* 142* 147*    Assessment/Plan: S/P Procedure(s) (LRB): AORTIC VALVE REPLACEMENT (AVR) USING INSPIRIS VALVE SIZE 21MM (N/A) MITRAL VALVE (MV) REPLACEMENT USING MOSAIC 310 CINCH SIZE 31MM (N/A) TRANSESOPHAGEAL ECHOCARDIOGRAM (N/A)  POD 4 Hemodynamically stable on milrinone 0.25, epi 5, NE 13 with CI 2.4, Co-ox 60.1. AHF team is following and directing inotrope wean. Preop biventricular failure.  Excellent diuresis on lasix drip 5 yesterday. -3600 cc and wt down 8 lbs. Now about 7 lbs over preop. Lasix drip held this am. Replete K+.  Platelets up to 112K. HIT pending but this will be negative. Will start NOAC and continue ASA 81 mg.  Glucose under good control on SSI.  Encourage nutrition, IS, mobilization.   LOS: 4 days    Gaye Pollack 05/11/2022

## 2022-05-11 NOTE — Progress Notes (Signed)
Patient ID: Shannon [name], female   DOB: 10-Feb-1951, 72 y.o.   MRN: [id number]  TCTS Evening Rounds:  Hemodynamically stable on 2 Epi, 0.25 milrinone and 12 NE.  UO good.  Ambulated 2 blocks of ICU today.

## 2022-05-11 NOTE — Progress Notes (Signed)
NAME:  Shannon [name], MRN:  [id number], DOB:  08/22/1950, LOS: 4 ADMISSION DATE:  05/07/2022, CONSULTATION DATE: 05/07/2022 REFERRING MD: Lavonna Monarch - TCTS CHIEF COMPLAINT: Status post valve replacement  History of Present Illness:  72 year old woman with past medical history of smoking, hypertension, GERD, and asthma.  She had been having some swallowing issues and was sent to cardiology for clearance prior to GI procedure and was found to have depressed LV function along with severe mitral and tricuspid regurgitation as well as moderate aortic insufficiency.  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  She was evaluated by cardiothoracic surgery who felt she was a good candidate for replacement/repair, and underwent elective aortic and mitral valve replacement on 3/19 under Dr. Tommi Rumps.    Postoperatively she was sent to the cardiac ICU for recovery on mechanical ventilation and PCCM was asked to evaluate for ICU care.  Pertinent Medical History:   Past Medical History:  Diagnosis Date   Anxiety    Arthritis    "right knee" (05/26/2014)   Asthma    CHF (congestive heart failure) (HCC)    chronic diastolic heart failure - sees Dr. Aundra Dubin   Chronic lower back pain    Dysrhythmia    irregular heart beat   GERD (gastroesophageal reflux disease)    HLD (hyperlipidemia)    Hypertension    Pneumonia    Stroke [facility])    TIA - ?2021   TIA (transient ischemic attack) 04/2018   Significant Hospital Events: Including procedures, antibiotic start and stop dates in addition to other pertinent events   3/19 MVR, AVR. 3/20 POD#1 from valve repair. Extubated ~0300. Slowly downtitrating pressors. Neo off, remains on Epi/NE. 3/21 POD#2. Bigeminy early AM with drop in CI to 1.4-1.6; Co-ox decreased to 49 from 60s. DDD Paced with significant improvement in index and Co-ox (61%). Epi weaned with drop in CI and pressures, increased back to 7. Diuresing BID. Echo with EF 30-35%, global hypokinesis,  moderate RV enlargement/dysfunction, s/p MVR. 3/22 POD#3, Pacing, weaning as able given septal dyssynchrony. CI improved 2.2, CVP 7 on Swan. Remains on milrinone 0.25, Epi 6, NE 8.    Interim History / Subjective:  Remains on relatively stable hemodynamic support.  Able to get up and walk around the unit today.  Denies shortness of breath or chest pain.  Objective:  Blood pressure 112/64, pulse (!) 111, temperature 98.6 F (37 C), resp. rate (!) 28, height 5\' 6"  (1.676 m), weight 66.6 kg, SpO2 97 %. PAP: (28-52)/(3-30) 40/29 CVP:  [0 mmHg-12 mmHg] 2 mmHg CO:  [4.1 L/min-4.3 L/min] 4.1 L/min CI:  [2.4 L/min/m2-2.5 L/min/m2] 2.4 L/min/m2      Intake/Output Summary (Last 24 hours) at 05/11/2022 1747 Last data filed at 05/11/2022 1600 Gross per 24 hour  Intake 1214.63 ml  Output 3715 ml  Net -2500.37 ml    Filed Weights   05/09/22 0500 05/10/22 0500 05/11/22 0500  Weight: 69.7 kg 70.2 kg 66.6 kg   Physical Examination: General: Acute-on-chronically ill-appearing elderly woman in NAD. HEENT: [place]/AT, anicteric sclera, PERRL, moist mucous membranes. Neuro: Awake, oriented x 4. Responds to verbal stimuli. Following commands consistently. Moves all 4 extremities spontaneously. Strength 4/5 in all 4 extremities. CV: Tachycardic to 110s, regular rhythm, no m/g/r. Heart sounds distant. Chest: Midline sternotomy with dressing c/d/I, no erythema/drainage. PULM: Breathing even and unlabored on 3LNC. Lung fields diminished bilaterally. GI: Soft, nontender, nondistended. Normoactive bowel sounds. Extremities: No significant LE edema noted. Skin: Warm/dry, no rashes.  Resolved Hospital Problem List:    Assessment & Plan:   Severe Mitral Regurgitation Moderate aortic regurgitation S/p open MVR, AVR 3/19  Post operative respiratory insufficiency, resolved Hx asthma, emphysema Chronic HFrEF  ?TIA History CKD at risk AKI post op, improved  Plan:  -Continue to slowly wean epinephrine to  keep cardiac index greater than 2.2 and norepinephrine to keep MAP greater than 65. -Continue low-dose milrinone for RV support -Diuretic infusion stopped.  Appears clinically close to euvolemic. -Baseline cardiac dysfunction following double valve surgery, it will take a while for the heart to recover.  Patient and judicious titration.  Best Practice (right click and "Reselect all SmartList Selections" daily)   Diet/type: Regular consistency (see orders) DVT prophylaxis: LMWH GI prophylaxis: PPI Lines: Central line and Arterial Line Foley:  Yes, and it is still needed Code Status:  full code Last date of multidisciplinary goals of care discussion [Per Primary Team]  Critical care time: 35 minutes   The patient is critically ill with multiple organ system failure and requires high complexity decision making for assessment and support, frequent evaluation and titration of therapies, advanced monitoring, review of radiographic studies and interpretation of complex data.   Critical Care Time devoted to patient care services, exclusive of separately billable procedures, described in this note is 34 minutes.  Kipp Brood, MD [facility] 05/11/22 5:47 PM  Please see [email or web address] for pager details.  From 7A-7P if no response, please call [phone] After hours, please call ELink [phone]

## 2022-05-12 ENCOUNTER — Inpatient Hospital Stay (HOSPITAL_COMMUNITY): Payer: Medicare HMO

## 2022-05-12 ENCOUNTER — Other Ambulatory Visit: Payer: Self-pay

## 2022-05-12 DIAGNOSIS — I5023 Acute on chronic systolic (congestive) heart failure: Secondary | ICD-10-CM | POA: Diagnosis not present

## 2022-05-12 DIAGNOSIS — E871 Hypo-osmolality and hyponatremia: Secondary | ICD-10-CM | POA: Diagnosis not present

## 2022-05-12 DIAGNOSIS — I342 Nonrheumatic mitral (valve) stenosis: Secondary | ICD-10-CM | POA: Diagnosis not present

## 2022-05-12 DIAGNOSIS — N1831 Chronic kidney disease, stage 3a: Secondary | ICD-10-CM | POA: Diagnosis not present

## 2022-05-12 DIAGNOSIS — Z952 Presence of prosthetic heart valve: Secondary | ICD-10-CM | POA: Diagnosis not present

## 2022-05-12 LAB — CBC WITH DIFFERENTIAL/PLATELET
Abs Immature Granulocytes: 0.05 10*3/uL (ref 0.00–0.07)
Basophils Absolute: 0 10*3/uL (ref 0.0–0.1)
Basophils Relative: 0 %
Eosinophils Absolute: 0.4 10*3/uL (ref 0.0–0.5)
Eosinophils Relative: 4 %
HCT: 22.7 % — ABNORMAL LOW (ref 36.0–46.0)
Hemoglobin: 7.4 g/dL — ABNORMAL LOW (ref 12.0–15.0)
Immature Granulocytes: 1 %
Lymphocytes Relative: 14 %
Lymphs Abs: 1.5 10*3/uL (ref 0.7–4.0)
MCH: 29.7 pg (ref 26.0–34.0)
MCHC: 32.6 g/dL (ref 30.0–36.0)
MCV: 91.2 fL (ref 80.0–100.0)
Monocytes Absolute: 1.4 10*3/uL — ABNORMAL HIGH (ref 0.1–1.0)
Monocytes Relative: 13 %
Neutro Abs: 7.3 10*3/uL (ref 1.7–7.7)
Neutrophils Relative %: 68 %
Platelets: 130 10*3/uL — ABNORMAL LOW (ref 150–400)
RBC: 2.49 MIL/uL — ABNORMAL LOW (ref 3.87–5.11)
RDW: 14.9 % (ref 11.5–15.5)
WBC: 10.6 10*3/uL — ABNORMAL HIGH (ref 4.0–10.5)
nRBC: 0 % (ref 0.0–0.2)

## 2022-05-12 LAB — GLUCOSE, CAPILLARY
Glucose-Capillary: 121 mg/dL — ABNORMAL HIGH (ref 70–99)
Glucose-Capillary: 128 mg/dL — ABNORMAL HIGH (ref 70–99)
Glucose-Capillary: 104 mg/dL — ABNORMAL HIGH (ref 70–99)
Glucose-Capillary: 152 mg/dL — ABNORMAL HIGH (ref 70–99)
Glucose-Capillary: 164 mg/dL — ABNORMAL HIGH (ref 70–99)

## 2022-05-12 LAB — BASIC METABOLIC PANEL
Anion gap: 7 (ref 5–15)
BUN: 9 mg/dL (ref 8–23)
CO2: 28 mmol/L (ref 22–32)
Calcium: 7.9 mg/dL — ABNORMAL LOW (ref 8.9–10.3)
Chloride: 97 mmol/L — ABNORMAL LOW (ref 98–111)
Creatinine, Ser: 0.9 mg/dL (ref 0.44–1.00)
GFR, Estimated: >60 mL/min (ref >60)
Glucose, Bld: 127 mg/dL — ABNORMAL HIGH (ref 70–99)
Potassium: 3.6 mmol/L (ref 3.5–5.1)
Sodium: 132 mmol/L — ABNORMAL LOW (ref 135–145)

## 2022-05-12 LAB — CBC
HCT: 21.4 % — ABNORMAL LOW (ref 36.0–46.0)
Hemoglobin: 7.3 g/dL — ABNORMAL LOW (ref 12.0–15.0)
MCH: 30.4 pg (ref 26.0–34.0)
MCHC: 34.1 g/dL (ref 30.0–36.0)
MCV: 89.2 fL (ref 80.0–100.0)
Platelets: 124 10*3/uL — ABNORMAL LOW (ref 150–400)
RBC: 2.4 MIL/uL — ABNORMAL LOW (ref 3.87–5.11)
RDW: 15.1 % (ref 11.5–15.5)
WBC: 9.7 10*3/uL (ref 4.0–10.5)
nRBC: 0 % (ref 0.0–0.2)

## 2022-05-12 LAB — COOXEMETRY PANEL
Carboxyhemoglobin: 2.1 % — ABNORMAL HIGH (ref 0.5–1.5)
Carboxyhemoglobin: 1.6 % — ABNORMAL HIGH (ref 0.5–1.5)
Methemoglobin: <0.7 % (ref 0.0–1.5)
Methemoglobin: <0.7 % (ref 0.0–1.5)
O2 Saturation: 78.5 %
O2 Saturation: 53.2 %
Total hemoglobin: 7.9 g/dL — ABNORMAL LOW (ref 12.0–16.0)
Total hemoglobin: 6.9 g/dL — CL (ref 12.0–16.0)

## 2022-05-12 LAB — MAGNESIUM: Magnesium: 2.5 mg/dL — ABNORMAL HIGH (ref 1.7–2.4)

## 2022-05-12 MED ORDER — POTASSIUM CHLORIDE CRYS ER 20 MEQ PO TBCR
20.0000 meq | EXTENDED_RELEASE_TABLET | ORAL | Status: AC
  Administered 2022-05-12 (×3): 20 meq via ORAL
  Filled 2022-05-12 (×3): qty 1

## 2022-05-12 MED ORDER — SODIUM CHLORIDE 0.9% FLUSH
10.0000 mL | Freq: Two times a day (BID) | INTRAVENOUS | Status: DC
  Administered 2022-05-13: 10 mL

## 2022-05-12 MED ORDER — SODIUM CHLORIDE 0.9% FLUSH: 10.0000 mL | INTRAVENOUS | Status: DC | PRN

## 2022-05-12 MED ORDER — FE FUM-VIT C-VIT B12-FA 460-60-0.01-1 MG PO CAPS
1.0000 | ORAL_CAPSULE | Freq: Every day | ORAL | Status: DC
  Administered 2022-05-12 – 2022-05-20 (×9): 1 via ORAL
  Filled 2022-05-12 (×9): qty 1

## 2022-05-12 NOTE — Progress Notes (Signed)
Peripherally Inserted Central Catheter Placement  The IV Nurse has discussed with the patient and/or persons authorized to consent for the patient, the purpose of this procedure and the potential benefits and risks involved with this procedure.  The benefits include less needle sticks, lab draws from the catheter, and the patient may be discharged home with the catheter. Risks include, but not limited to, infection, bleeding, blood clot (thrombus formation), and puncture of an artery; nerve damage and irregular heartbeat and possibility to perform a PICC exchange if needed/ordered by physician.  Alternatives to this procedure were also discussed.  Bard Power PICC patient education guide, fact sheet on infection prevention and patient information card has been provided to patient /or left at bedside.    PICC Placement Documentation  PICC Double Lumen 05/12/22 Right Brachial 37 cm 0 cm (Active)  Indication for Insertion or Continuance of Line Chronic illness with exacerbations (CF, Sickle Cell, etc.);Vasoactive infusions 05/12/22 1150  Exposed Catheter (cm) 0 cm 05/12/22 1150  Site Assessment Clean, Dry, Intact 05/12/22 1150  Lumen #1 Status Flushed;Saline locked;Blood return noted 05/12/22 1150  Lumen #2 Status Flushed;Saline locked;Blood return noted 05/12/22 1150  Dressing Type Transparent;Securing device 05/12/22 1150  Dressing Status Antimicrobial disc in place;Clean, Dry, Intact 05/12/22 1150  Safety Lock Not Applicable [id number]  Line Care Connections checked and tightened 05/12/22 1150  Line Adjustment (NICU/IV Team Only) No 05/12/22 1150  Dressing Intervention New dressing 05/12/22 1150  Dressing Change Due 05/19/22 05/12/22 1150       Rolena Infante 05/12/2022, 11:51 AM

## 2022-05-12 NOTE — Progress Notes (Signed)
5 Days Post-Op Procedure(s) (LRB): AORTIC VALVE REPLACEMENT (AVR) USING INSPIRIS VALVE SIZE 21MM (N/A) MITRAL VALVE (MV) REPLACEMENT USING MOSAIC 310 CINCH SIZE 31MM (N/A) TRANSESOPHAGEAL ECHOCARDIOGRAM (N/A) Subjective:  No complaints. Did not sleep much. Walked  Objective: Vital signs in last 24 hours: Temp:  [98 F (36.7 C)-99.3 F (37.4 C)] 98 F (36.7 C) (03/24 0655) Pulse Rate:  [99-116] 109 (03/24 0730) Cardiac Rhythm: Sinus tachycardia (03/23 1947) Resp:  [16-34] 20 (03/24 0730) BP: (82-156)/(49-134) 110/64 (03/24 0730) SpO2:  [94 %-100 %] 100 % (03/24 0730) Arterial Line BP: (80-147)/(41-79) 94/54 (03/24 0730) Weight:  [65.4 kg] 65.4 kg (03/24 0600)  Hemodynamic parameters for last 24 hours: PAP: (28-42)/(13-29) 40/29 CVP:  [2 mmHg-12 mmHg] 2 mmHg  Intake/Output from previous day: 03/23 0701 - 03/24 0700 In: 1684.1 [P.O.:720; I.V.:864; IV Piggyback:100.1] Out: 2240 [Urine:2240] Intake/Output this shift: No intake/output data recorded.  General appearance: alert and cooperative Neurologic: intact Heart: regular rate and rhythm, S1, S2 normal, no murmur Lungs: diminished breath sounds bibasilar Extremities: edema mild Wound: incision healing well  Lab Results: Recent Labs    05/12/22 0336 05/12/22 0402  WBC 9.7 10.6*  HGB 7.3* 7.4*  HCT 21.4* 22.7*  PLT 124* 130*   BMET:  Recent Labs    05/11/22 0752 05/12/22 0336  NA 135 132*  K 3.1* 3.6  CL 97* 97*  CO2 29 28  GLUCOSE 152* 127*  BUN 10 9  CREATININE 0.92 0.90  CALCIUM 7.7* 7.9*    PT/INR: No results for input(s): "LABPROT", "INR" in the last 72 hours. ABG    Component Value Date/Time   PHART 7.407 05/08/2022 1522   HCO3 22.1 05/08/2022 1522   TCO2 23 05/08/2022 1522   ACIDBASEDEF 2.0 05/08/2022 1522   O2SAT 78.5 05/12/2022 0336   CBG (last 3)  Recent Labs    05/11/22 1537 05/11/22 2024 05/12/22 0657  GLUCAP 105* 121* 128*   CXR: bibasilar  atelectasis.   Assessment/Plan: S/P Procedure(s) (LRB): AORTIC VALVE REPLACEMENT (AVR) USING INSPIRIS VALVE SIZE 21MM (N/A) MITRAL VALVE (MV) REPLACEMENT USING MOSAIC 310 CINCH SIZE 31MM (N/A) TRANSESOPHAGEAL ECHOCARDIOGRAM (N/A)  Hemodynamics remain stable but still on milrinone 0.25, epi 2, NE 14. Co-ox this am was 78.5. Reportedly pressure dropped significantly when drips held to draw Co-ox through sleeve. She is POD 5 with severe biventricular dysfunction. Will have double lumen PICC inserted so we can get sleeve out to prevent infection.  AHF team deciding about inotrope wean.  Wt down 2.5 lbs, -555 cc. Only about 5 lbs over preop.  Anemia: Hgb trending down from 8.7 yesterday to 7.4 this am likely due to vampire effect. Start iron. Transfusion may help with vasodilator wean.  Glucose under good control.  Continue IS, ambulation.   LOS: 5 days    Gaye Pollack 05/12/2022

## 2022-05-12 NOTE — Progress Notes (Signed)
Date and time results received: 05/12/22    Test: hgb  Critical Value: 6.9   Name of Provider Notified: Agarwala  No new orders given

## 2022-05-12 NOTE — Progress Notes (Signed)
NAME:  Shannon [name], MRN:  [id number], DOB:  [birth year], LOS: 5 ADMISSION DATE:  05/07/2022, CONSULTATION DATE: 05/07/2022 REFERRING MD: Lavonna Monarch - TCTS CHIEF COMPLAINT: Status post valve replacement  History of Present Illness:  72 year old woman with past medical history of smoking, hypertension, GERD, and asthma.  She had been having some swallowing issues and was sent to cardiology for clearance prior to GI procedure and was found to have depressed LV function along with severe mitral and tricuspid regurgitation as well as moderate aortic insufficiency.  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  She was evaluated by cardiothoracic surgery who felt she was a good candidate for replacement/repair, and underwent elective aortic and mitral valve replacement on 3/19 under Dr. Tommi Rumps.    Postoperatively she was sent to the cardiac ICU for recovery on mechanical ventilation and PCCM was asked to evaluate for ICU care.  Pertinent Medical History:   Past Medical History:  Diagnosis Date   Anxiety    Arthritis    "right knee" (05/26/2014)   Asthma    CHF (congestive heart failure) (HCC)    chronic diastolic heart failure - sees Dr. Aundra Dubin   Chronic lower back pain    Dysrhythmia    irregular heart beat   GERD (gastroesophageal reflux disease)    HLD (hyperlipidemia)    Hypertension    Pneumonia    Stroke [facility])    TIA - ?2021   TIA (transient ischemic attack) 04/2018   Significant Hospital Events: Including procedures, antibiotic start and stop dates in addition to other pertinent events   3/19 MVR, AVR. 3/20 POD#1 from valve repair. Extubated ~0300. Slowly downtitrating pressors. Neo off, remains on Epi/NE. 3/21 POD#2. Bigeminy early AM with drop in CI to 1.4-1.6; Co-ox decreased to 49 from 60s. DDD Paced with significant improvement in index and Co-ox (61%). Epi weaned with drop in CI and pressures, increased back to 7. Diuresing BID. Echo with EF 30-35%, global hypokinesis,  moderate RV enlargement/dysfunction, s/p MVR. 3/22 POD#3, Pacing, weaning as able given septal dyssynchrony. CI improved 2.2, CVP 7 on Swan. Remains on milrinone 0.25, Epi 6, NE 8.    Interim History / Subjective:  Remains on relatively stable hemodynamic support.  Able to get up and walk around the unit today.  Denies shortness of breath or chest pain.  Objective:  Blood pressure 104/68, pulse 94, temperature 98 F (36.7 C), temperature source Oral, resp. rate (!) 25, height 5\' 6"  (1.676 m), weight 65.4 kg, SpO2 99 %.        Intake/Output Summary (Last 24 hours) at 05/12/2022 1718 Last data filed at 05/12/2022 1600 Gross per 24 hour  Intake 1470.68 ml  Output 1300 ml  Net 170.68 ml    Filed Weights   05/10/22 0500 05/11/22 0500 05/12/22 0600  Weight: 70.2 kg 66.6 kg 65.4 kg   Physical Examination: General: Acute-on-chronically ill-appearing elderly woman in NAD. HEENT: [place]/AT, anicteric sclera, PERRL, moist mucous membranes. Neuro: Awake, oriented x 4. Responds to verbal stimuli. Following commands consistently. Moves all 4 extremities spontaneously. Strength 4/5 in all 4 extremities. CV: Tachycardic to 110s, regular rhythm, no m/g/r. Heart sounds distant. Chest: Midline sternotomy with dressing c/d/I, no erythema/drainage. PULM: Breathing even and unlabored on 3LNC. Lung fields diminished bilaterally. GI: Soft, nontender, nondistended. Normoactive bowel sounds. Extremities: No significant LE edema noted. Skin: Warm/dry, no rashes.  Ancillary tests personally reviewed:  Sodium 132 Hemoglobin 7.4, platelets improving at 130 Assessment & Plan:   Severe Mitral  Regurgitation Moderate aortic regurgitation S/p open MVR, AVR 3/19  Post operative respiratory insufficiency, resolved Hx asthma, emphysema Chronic HFrEF  ?TIA History CKD at risk AKI post op, improved  Plan:  -Continue to slowly wean  norepinephrine to keep MAP greater than 65.  Epinephrine has been  stopped -Continue low-dose milrinone for RV support -Diuretic infusion stopped.  Appears clinically close to euvolemic. -Baseline cardiac dysfunction following double valve surgery, it will take a while for the heart to recover.   Best Practice (right click and "Reselect all SmartList Selections" daily)   Diet/type: Regular consistency (see orders) DVT prophylaxis: LMWH GI prophylaxis: PPI Lines: Central line and Arterial Line Foley:  Yes, and it is still needed Code Status:  full code Last date of multidisciplinary goals of care discussion [Per Primary Team]  Critical care time: 35 minutes   The patient is critically ill with multiple organ system failure and requires high complexity decision making for assessment and support, frequent evaluation and titration of therapies, advanced monitoring, review of radiographic studies and interpretation of complex data.   Critical Care Time devoted to patient care services, exclusive of separately billable procedures, described in this note is 34 minutes.  Kipp Brood, MD [facility] 05/12/22 5:18 PM  Please see [email or web address] for pager details.  From 7A-7P if no response, please call [phone] After hours, please call ELink [phone]

## 2022-05-12 NOTE — Progress Notes (Signed)
Patient ID: Shannon [name], female   DOB: 12/12/1950, 72 y.o.   MRN: [id number] TCTS Evening Rounds:  Hemodynamics stable on milrinone 0.25, Epi 2, NE 14.  Had PICC inserted today and Co-ox 53.2.  UO ok

## 2022-05-12 NOTE — Progress Notes (Signed)
Advanced Heart Failure Rounding Note  PCP-Cardiologist: Quay Burow, MD   Subjective:    S/p AVR/MVR. Post-op Echo EF 30-35%, RV mod reduced. Stable AV and MV prosthesis   POD # 5  Diuretics on hold. Weaning NE and EPI   Remains on milrinone 0.25, Epi 2 and NE 12  Feels good. Ambulating hall. Denies CP or SOB   CVP 5  Co-ox 60% -> 79%  BP drops quickly when pressors paused.    Objective:   Weight Range: 65.4 kg Body mass index is 23.27 kg/m.   Vital Signs:   Temp:  [98 F (36.7 C)-99.3 F (37.4 C)] 98 F (36.7 C) (03/24 0655) Pulse Rate:  [99-113] 104 (03/24 1115) Resp:  [16-34] 22 (03/24 1115) BP: (79-156)/(49-134) 109/61 (03/24 1115) SpO2:  [94 %-100 %] 94 % (03/24 1115) Arterial Line BP: (80-147)/(41-79) 88/53 (03/24 0815) Weight:  [65.4 kg] 65.4 kg (03/24 0600) Last BM Date : 05/11/22  Weight change: Filed Weights   05/10/22 0500 05/11/22 0500 05/12/22 0600  Weight: 70.2 kg 66.6 kg 65.4 kg    Intake/Output:   Intake/Output Summary (Last 24 hours) at 05/12/2022 1226 Last data filed at 05/12/2022 1100 Gross per 24 hour  Intake 1073.11 ml  Output 1405 ml  Net -331.89 ml       Physical Exam    General:Sitting up in bed  No resp difficulty HEENT: normal Neck: supple. no JVD. Carotids 2+ bilat; no bruits. No lymphadenopathy or thryomegaly appreciated. Cor: PMI nondisplaced. Regular rate & rhythm. No rubs, gallops or murmurs. Lungs: clear Abdomen: soft, nontender, nondistended. No hepatosplenomegaly. No bruits or masses. Good bowel sounds. Extremities: no cyanosis, clubbing, rash, edema Neuro: alert & orientedx3, cranial nerves grossly intact. moves all 4 extremities w/o difficulty. Affect pleasant   Telemetry   Sinus tach 100-110 with long 1AVB  Personally reviewed  Labs    CBC Recent Labs    05/12/22 0336 05/12/22 0402  WBC 9.7 10.6*  NEUTROABS  --  7.3  HGB 7.3* 7.4*  HCT 21.4* 22.7*  MCV 89.2 91.2  PLT 124* 130*    Basic  Metabolic Panel Recent Labs    05/11/22 0652 05/11/22 0752 05/12/22 0336  NA 137 135 132*  K 3.2* 3.1* 3.6  CL 96* 97* 97*  CO2 26 29 28   GLUCOSE 135* 152* 127*  BUN 11 10 9   CREATININE 0.94 0.92 0.90  CALCIUM 8.0* 7.7* 7.9*  MG 1.4* 1.4* 2.5*  PHOS 2.6 2.6  --     Liver Function Tests Recent Labs    05/10/22 0421  ALBUMIN 1.8*    No results for input(s): "LIPASE", "AMYLASE" in the last 72 hours. Cardiac Enzymes No results for input(s): "CKTOTAL", "CKMB", "CKMBINDEX", "TROPONINI" in the last 72 hours.  BNP: BNP (last 3 results) Recent Labs    03/18/22 1540  BNP 58.1     ProBNP (last 3 results) Recent Labs    02/26/22 1546  PROBNP 2,865*      D-Dimer No results for input(s): "DDIMER" in the last 72 hours. Hemoglobin A1C No results for input(s): "HGBA1C" in the last 72 hours. Fasting Lipid Panel No results for input(s): "CHOL", "HDL", "LDLCALC", "TRIG", "CHOLHDL", "LDLDIRECT" in the last 72 hours. Thyroid Function Tests No results for input(s): "TSH", "T4TOTAL", "T3FREE", "THYROIDAB" in the last 72 hours.  Invalid input(s): "FREET3"  Other results:   Imaging    [place] EKG SITE RITE  Result Date: 05/12/2022 If [name] image not attached, placement  could not be confirmed due to current cardiac rhythm.  DG Chest Port 1 View  Result Date: 05/12/2022 CLINICAL DATA:  Status post AVR. EXAM: PORTABLE CHEST 1 VIEW COMPARISON:  05/10/2022 FINDINGS: Lungs are hyperexpanded. The cardio pericardial silhouette is enlarged. Interstitial markings are diffusely coarsened with chronic features. Vascular congestion noted without overt airspace pulmonary edema. Bibasilar atelectasis again noted, left greater than right with tiny bilateral pleural effusions. Pulmonary artery catheter is been removed in the interval with right IJ sheath still in place. IMPRESSION: 1. Hyperexpansion with vascular congestion and tiny bilateral pleural effusions. 2. Bibasilar atelectasis,  left greater than right. Electronically Signed   By: Misty Stanley M.D.   On: 05/12/2022 05:12     Medications:     Scheduled Medications:  acetaminophen  1,000 mg Oral Q6H   Or   acetaminophen (TYLENOL) oral liquid 160 mg/5 mL  1,000 mg Per Tube Q6H   amiodarone  200 mg Oral BID   apixaban  5 mg Oral BID   aspirin EC  81 mg Oral Daily   atorvastatin  80 mg Oral q1800   bisacodyl  10 mg Oral Daily   Or   bisacodyl  10 mg Rectal Daily   busPIRone  10 mg Oral BID   Chlorhexidine Gluconate Cloth  6 each Topical Daily   docusate sodium  200 mg Oral Daily   ezetimibe  10 mg Oral Daily   Fe Fum-Vit C-Vit B12-FA  1 capsule Oral QPC breakfast   feeding supplement  237 mL Oral BID BM   FLUoxetine  20 mg Oral Daily   fluticasone  1 spray Each Nare Daily   gabapentin  100 mg Oral BID   insulin aspart  0-24 Units Subcutaneous TID AC & HS   mometasone-formoterol  2 puff Inhalation BID   montelukast  10 mg Oral QHS   pantoprazole  40 mg Oral Daily   potassium chloride  20 mEq Oral Q4H   sodium chloride flush  10-40 mL Intracatheter Q12H   sodium chloride flush  3 mL Intravenous Q12H    Infusions:  sodium chloride     sodium chloride     sodium chloride 10 mL/hr at 05/10/22 1919   epinephrine 2 mcg/min (05/12/22 1100)   lactated ringers 20 mL/hr at 05/10/22 0925   milrinone 0.25 mcg/kg/min (05/12/22 1100)   norepinephrine (LEVOPHED) Adult infusion 12 mcg/min (05/12/22 1100)    PRN Medications: sodium chloride, albuterol, ondansetron (ZOFRAN) IV, mouth rinse, oxyCODONE, sodium chloride flush, sodium chloride flush, traMADol    Patient Profile   Shannon [name] is a 72 y.o. 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 AHF team consulted for management of post-cardiotomy shock.  Assessment/Plan   1. Valvular heart disease - s/p MVR/AVR 3/19 - post-op TEE: EF was 30-40% and the valves were well  seated and the mitral had a mean of 87mmHg and the aortic had a mean of 10mmHg  - post-op TTE Post-op Echo EF 30-35%, RV mod reduced, prosthetic valves ok    Acute on chronic systolic CHF -  Echo in 0000000 EF 40-45%, mild RV dilation, moderate pulmonary HTN, mod-severe MR and moderate-severe TR.  - RHC/LHC (1/24) with elevated left and right heart filling pressures, pulmonary venous hypertension, preserved cardiac output.  NICM. Cause is uncertain, ?valvular disease.  - Post-op Echo EF 30-35%, RV mod reduced. - On norepi 17 -> 12  and epi  8 -> 5 -> 2 + milrinone 0.25  - (Keep EPI at 2 today DO NOT WEAN NOREPI below 5 today)  - co-ox 78%.  - Volume status improved weight nearing baseline ~140 - Improving slowly but still struggling with RV failure post-op.  - Continue to hold diuretics - Add digoxin  - Other GDMT on hold with need for pressors/inotropes - PICC placed - D/w Dr. Cyndia Bent at bedside   Pulmonary hypertension - Group 2 pulmonary venous hypertension.    Hyponatremia - Resolved  H/o TIA/CVA - On plavix at home, anticoagulation per TCTS   CKD 3a, h/o AKI - stable Scr 0.9 - avoid hypotension - daily BMET    7. Thrombocytopenia - improving  8. Hypokalemia/hypomag - supp  9. Accelerated junctional rhythm (vs sinus with long 1AVB) - decreased amio to 200 bid - now in sinus tach. Watch with addition of digoxin  CRITICAL CARE Performed by: Glori Bickers  Total critical care time: 35 minutes  Critical care time was exclusive of separately billable procedures and treating other patients.  Critical care was necessary to treat or prevent imminent or life-threatening deterioration.  Critical care was time spent personally by me (independent of midlevel providers or residents) on the following activities: development of treatment plan with patient and/or surrogate as well as nursing, discussions with consultants, evaluation of patient's response to treatment, examination  of patient, obtaining history from patient or surrogate, ordering and performing treatments and interventions, ordering and review of laboratory studies, ordering and review of radiographic studies, pulse oximetry and re-evaluation of patient's condition.    Length of Stay: [place], MD  05/12/2022, 12:26 PM  Advanced Heart Failure Team Pager [phone] (M-F; 7a - 5p)  Please contact [facility] for night-coverage after hours (5p -7a ) and weekends on [email or web address]

## 2022-05-13 ENCOUNTER — Inpatient Hospital Stay (HOSPITAL_COMMUNITY): Payer: Medicare HMO

## 2022-05-13 DIAGNOSIS — D649 Anemia, unspecified: Secondary | ICD-10-CM

## 2022-05-13 DIAGNOSIS — I5023 Acute on chronic systolic (congestive) heart failure: Secondary | ICD-10-CM | POA: Diagnosis not present

## 2022-05-13 DIAGNOSIS — Z952 Presence of prosthetic heart valve: Secondary | ICD-10-CM | POA: Diagnosis not present

## 2022-05-13 DIAGNOSIS — E871 Hypo-osmolality and hyponatremia: Secondary | ICD-10-CM

## 2022-05-13 DIAGNOSIS — R5381 Other malaise: Secondary | ICD-10-CM

## 2022-05-13 LAB — GLUCOSE, CAPILLARY
Glucose-Capillary: 130 mg/dL — ABNORMAL HIGH (ref 70–99)
Glucose-Capillary: 130 mg/dL — ABNORMAL HIGH (ref 70–99)
Glucose-Capillary: 134 mg/dL — ABNORMAL HIGH (ref 70–99)
Glucose-Capillary: 143 mg/dL — ABNORMAL HIGH (ref 70–99)

## 2022-05-13 LAB — COOXEMETRY PANEL
Carboxyhemoglobin: 1.9 % — ABNORMAL HIGH (ref 0.5–1.5)
Carboxyhemoglobin: 1.3 % (ref 0.5–1.5)
Methemoglobin: <0.7 % (ref 0.0–1.5)
Methemoglobin: <0.7 % (ref 0.0–1.5)
O2 Saturation: 67.7 %
O2 Saturation: 64.2 %
Total hemoglobin: <6.9 g/dL — CL (ref 12.0–16.0)
Total hemoglobin: 8.2 g/dL — ABNORMAL LOW (ref 12.0–16.0)

## 2022-05-13 LAB — BASIC METABOLIC PANEL
Anion gap: 4 — ABNORMAL LOW (ref 5–15)
BUN: 9 mg/dL (ref 8–23)
CO2: 29 mmol/L (ref 22–32)
Calcium: 7.8 mg/dL — ABNORMAL LOW (ref 8.9–10.3)
Chloride: 98 mmol/L (ref 98–111)
Creatinine, Ser: 0.78 mg/dL (ref 0.44–1.00)
GFR, Estimated: >60 mL/min (ref >60)
Glucose, Bld: 164 mg/dL — ABNORMAL HIGH (ref 70–99)
Potassium: 4.1 mmol/L (ref 3.5–5.1)
Sodium: 131 mmol/L — ABNORMAL LOW (ref 135–145)

## 2022-05-13 LAB — CBC
HCT: 20.5 % — ABNORMAL LOW (ref 36.0–46.0)
Hemoglobin: 6.6 g/dL — CL (ref 12.0–15.0)
MCH: 30 pg (ref 26.0–34.0)
MCHC: 32.2 g/dL (ref 30.0–36.0)
MCV: 93.2 fL (ref 80.0–100.0)
Platelets: 141 10*3/uL — ABNORMAL LOW (ref 150–400)
RBC: 2.2 MIL/uL — ABNORMAL LOW (ref 3.87–5.11)
RDW: 15.2 % (ref 11.5–15.5)
WBC: 9.4 10*3/uL (ref 4.0–10.5)
nRBC: 0 % (ref 0.0–0.2)

## 2022-05-13 LAB — MAGNESIUM: Magnesium: 2 mg/dL (ref 1.7–2.4)

## 2022-05-13 LAB — PREPARE RBC (CROSSMATCH)

## 2022-05-13 MED ORDER — MILRINONE LACTATE IN DEXTROSE 20-5 MG/100ML-% IV SOLN
0.1250 ug/kg/min | INTRAVENOUS | Status: DC
  Administered 2022-05-13 – 2022-05-15 (×2): 0.125 ug/kg/min via INTRAVENOUS
  Filled 2022-05-13 (×3): qty 100

## 2022-05-13 MED ORDER — FUROSEMIDE 10 MG/ML IJ SOLN
40.0000 mg | Freq: Once | INTRAMUSCULAR | Status: AC
  Administered 2022-05-13: 40 mg via INTRAVENOUS
  Filled 2022-05-13: qty 4

## 2022-05-13 MED ORDER — POTASSIUM CHLORIDE CRYS ER 20 MEQ PO TBCR
20.0000 meq | EXTENDED_RELEASE_TABLET | Freq: Once | ORAL | Status: AC
  Administered 2022-05-13: 20 meq via ORAL
  Filled 2022-05-13: qty 1

## 2022-05-13 MED ORDER — SODIUM CHLORIDE 0.9% IV SOLUTION: Freq: Once | INTRAVENOUS | Status: DC

## 2022-05-13 MED ORDER — DIGOXIN 125 MCG PO TABS
0.1250 mg | ORAL_TABLET | Freq: Every day | ORAL | Status: DC
  Administered 2022-05-13 – 2022-05-20 (×8): 0.125 mg via ORAL
  Filled 2022-05-13 (×9): qty 1

## 2022-05-13 MED ORDER — MILRINONE LACTATE IN DEXTROSE 20-5 MG/100ML-% IV SOLN: 0.1250 ug/kg/min | INTRAVENOUS | Status: DC

## 2022-05-13 NOTE — Progress Notes (Signed)
Co-ox this afternoon 64% and CVP stable.   Wean milrinone to 0.125. Keep epi at 2. Keep levo at least at [address] AGACNP-BC 05/13/22

## 2022-05-13 NOTE — Progress Notes (Signed)
NAME:  Shannon [name], MRN:  [id number], DOB:  [birth year], LOS: 6 ADMISSION DATE:  05/07/2022, CONSULTATION DATE: 05/07/2022 REFERRING MD: Lavonna Monarch - TCTS CHIEF COMPLAINT: Status post valve replacement  History of Present Illness:  72 year old woman with past medical history of smoking, hypertension, GERD, and asthma.  She had been having some swallowing issues and was sent to cardiology for clearance prior to GI procedure and was found to have depressed LV function along with severe mitral and tricuspid regurgitation as well as moderate aortic insufficiency.  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  She was evaluated by cardiothoracic surgery who felt she was a good candidate for replacement/repair, and underwent elective aortic and mitral valve replacement on 3/19 under Dr. Tommi Rumps.    Postoperatively she was sent to the cardiac ICU for recovery on mechanical ventilation and PCCM was asked to evaluate for ICU care.  Pertinent Medical History:   Past Medical History:  Diagnosis Date   Anxiety    Arthritis    "right knee" (05/26/2014)   Asthma    CHF (congestive heart failure) (HCC)    chronic diastolic heart failure - sees Dr. Aundra Dubin   Chronic lower back pain    Dysrhythmia    irregular heart beat   GERD (gastroesophageal reflux disease)    HLD (hyperlipidemia)    Hypertension    Pneumonia    Stroke [facility])    TIA - ?2021   TIA (transient ischemic attack) 04/2018   Significant Hospital Events: Including procedures, antibiotic start and stop dates in addition to other pertinent events   3/19 MVR, AVR. 3/20 POD#1 from valve repair. Extubated ~0300. Slowly downtitrating pressors. Neo off, remains on Epi/NE. 3/21 POD#2. Bigeminy early AM with drop in CI to 1.4-1.6; Co-ox decreased to 49 from 60s. DDD Paced with significant improvement in index and Co-ox (61%). Epi weaned with drop in CI and pressures, increased back to 7. Diuresing BID. Echo with EF 30-35%, global hypokinesis,  moderate RV enlargement/dysfunction, s/p MVR. 3/22 POD#3, Pacing, weaning as able given septal dyssynchrony. CI improved 2.2, CVP 7 on [place]. Remains on milrinone 0.25, Epi 6, NE 8.    Interim History / Subjective:  Denies complaints. Eating well, walking.   Objective:  Blood pressure 109/70, pulse (!) 102, temperature 98.1 F (36.7 C), temperature source Oral, resp. rate (!) 27, height 5\' 6"  (1.676 m), weight 68 kg, SpO2 99 %.        Intake/Output Summary (Last 24 hours) at 05/13/2022 0700 Last data filed at 05/13/2022 0600 Gross per 24 hour  Intake 1307.48 ml  Output 850 ml  Net 457.48 ml    Filed Weights   05/11/22 0500 05/12/22 0600 05/13/22 0500  Weight: 66.6 kg 65.4 kg 68 kg   Physical Examination: General: elderly woman sitting in the recliner in NAD HEENT: [place]/AT, eyes anicteric. Neuro: awake, alert, able to sit forward easily. Answering questions appropriately. Moving all extremities.  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  -digoxin -aspirin and statin -apixaban for AC per TCTS  Afib, paroxysmal -amiodarone, eliquis  Post operative respiratory insufficiency, resolved Hx asthma, emphysema -pulmonary hygiene, wean O2 -[place] BID   ?TIA History CKD at risk AKI post op, improved  HLD -zetia, statin  Hyperglycemia -SSI PRN -goal BG 140-180  Acute anemia, ABLA expected post-op Consumptive thrombocytopenia, expected  post-op -1 unit pRBC today per TCTS -monitor  Hyponatremia, possibly due to diuresis -monitor   Deconditioning -PT, OT  Best Practice (right click and "Reselect all SmartList Selections" daily)   Diet/type: Regular consistency (see orders) DVT prophylaxis: DOAC GI prophylaxis: PPI Lines: Central line Foley:  N/A Code Status:  full code Last date of multidisciplinary goals of care discussion [Per Primary Team]  Critical care time:       Julian Hy, DO 05/13/22 11:04 AM [facility]  For contact information, see Amion. If no response to pager, please call PCCM consult pager. After hours, 7PM- 7AM, please call Elink.

## 2022-05-13 NOTE — Progress Notes (Addendum)
Advanced Heart Failure Rounding Note  PCP-Cardiologist: Quay Burow, MD   Subjective:    S/p AVR/MVR. Post-op Echo EF 30-35%, RV mod reduced. Stable AV and MV prosthesis   POD # 6  Remains on milrinone 0.25, Epi 2 and NE 9. (Weaning)  CVP not set up, Co-ox 68%  Weight up 5lbs, Scr .78. Hgb 6.6 , getting 1uPRBC this am  Sitting in chair, feels fine. Denies CP/SOB.   BP drops quickly when pressors paused.  Objective:   Weight Range: 68 kg Body mass index is 24.19 kg/m.   Vital Signs:   Temp:  [98.1 F (36.7 C)-98.3 F (36.8 C)] 98.1 F (36.7 C) (03/25 0650) Pulse Rate:  [94-109] 102 (03/25 0600) Resp:  [15-31] 27 (03/25 0600) BP: (79-130)/(48-94) 109/70 (03/25 0600) SpO2:  [78 %-100 %] 99 % (03/25 0600) Arterial Line BP: (88-98)/(53-56) 88/53 (03/24 0815) Weight:  [68 kg] 68 kg (03/25 0500) Last BM Date : 05/11/22  Weight change: Filed Weights   05/11/22 0500 05/12/22 0600 05/13/22 0500  Weight: 66.6 kg 65.4 kg 68 kg    Intake/Output:   Intake/Output Summary (Last 24 hours) at 05/13/2022 0726 Last data filed at 05/13/2022 0600 Gross per 24 hour  Intake 1307.48 ml  Output 850 ml  Net 457.48 ml    Physical Exam    General:  elderly appearing.  No respiratory difficulty HEENT: normal Neck: supple. JVD ~12 cm. Carotids 2+ bilat; no bruits. No lymphadenopathy or thyromegaly appreciated. Cor: PMI nondisplaced. Regular rate & rhythm. No rubs, gallops or murmurs. Lungs: clear, diminished bases Abdomen: soft, nontender, nondistended. No hepatosplenomegaly. No bruits or masses. Good bowel sounds. Extremities: no cyanosis, clubbing, rash, edema. PICC RUE Neuro: alert & oriented x 3, cranial nerves grossly intact. moves all 4 extremities w/o difficulty. Affect pleasant.   Telemetry   Sinus tach 100s, intt PVCs (Personally reviewed)  Labs    CBC Recent Labs    05/12/22 0402 05/13/22 0347  WBC 10.6* 9.4  NEUTROABS 7.3  --   HGB 7.4* 6.6*  HCT 22.7*  20.5*  MCV 91.2 93.2  PLT 130* [id number]*   Basic Metabolic Panel Recent Labs    05/11/22 0652 05/11/22 0752 05/12/22 0336 05/13/22 0347  NA 137 135 132* 131*  K 3.2* 3.1* 3.6 4.1  CL 96* 97* 97* 98  CO2 26 29 28 29   GLUCOSE 135* 152* 127* 164*  BUN 11 10 9 9   CREATININE 0.94 0.92 0.90 0.78  CALCIUM 8.0* 7.7* 7.9* 7.8*  MG 1.4* 1.4* 2.5* 2.0  PHOS 2.6 2.6  --   --    Liver Function Tests No results for input(s): "AST", "ALT", "ALKPHOS", "BILITOT", "PROT", "ALBUMIN" in the last 72 hours. No results for input(s): "LIPASE", "AMYLASE" in the last 72 hours. Cardiac Enzymes No results for input(s): "CKTOTAL", "CKMB", "CKMBINDEX", "TROPONINI" in the last 72 hours.  BNP: BNP (last 3 results) Recent Labs    03/18/22 1540  BNP 58.1    ProBNP (last 3 results) Recent Labs    02/26/22 1546  PROBNP 2,865*   D-Dimer No results for input(s): "DDIMER" in the last 72 hours. Hemoglobin A1C No results for input(s): "HGBA1C" in the last 72 hours. Fasting Lipid Panel No results for input(s): "CHOL", "HDL", "LDLCALC", "TRIG", "CHOLHDL", "LDLDIRECT" in the last 72 hours. Thyroid Function Tests No results for input(s): "TSH", "T4TOTAL", "T3FREE", "THYROIDAB" in the last 72 hours.  Invalid input(s): "FREET3"  Other results:  Imaging   [place] EKG SITE RITE  Result Date: 05/12/2022 If [facility] image not attached, placement could not be confirmed due to current cardiac rhythm.   Medications:   Scheduled Medications:  sodium chloride   Intravenous Once   amiodarone  200 mg Oral BID   apixaban  5 mg Oral BID   aspirin EC  81 mg Oral Daily   atorvastatin  80 mg Oral q1800   bisacodyl  10 mg Oral Daily   Or   bisacodyl  10 mg Rectal Daily   busPIRone  10 mg Oral BID   Chlorhexidine Gluconate Cloth  6 each Topical Daily   docusate sodium  200 mg Oral Daily   ezetimibe  10 mg Oral Daily   Fe Fum-Vit C-Vit B12-FA  1 capsule Oral QPC breakfast   feeding supplement  237 mL Oral BID BM    FLUoxetine  20 mg Oral Daily   fluticasone  1 spray Each Nare Daily   gabapentin  100 mg Oral BID   insulin aspart  0-24 Units Subcutaneous TID AC & HS   mometasone-formoterol  2 puff Inhalation BID   montelukast  10 mg Oral QHS   pantoprazole  40 mg Oral Daily   sodium chloride flush  10-40 mL Intracatheter Q12H   sodium chloride flush  3 mL Intravenous Q12H    Infusions:  sodium chloride     sodium chloride     sodium chloride 10 mL/hr at 05/10/22 1919   epinephrine 2 mcg/min (05/13/22 0600)   lactated ringers 20 mL/hr at 05/10/22 0925   milrinone 0.25 mcg/kg/min (05/13/22 0600)   norepinephrine (LEVOPHED) Adult infusion 9 mcg/min (05/13/22 0600)    PRN Medications: sodium chloride, albuterol, ondansetron (ZOFRAN) IV, mouth rinse, oxyCODONE, sodium chloride flush, sodium chloride flush, traMADol  Patient Profile   Ms. Bolton is a 72 y.o. 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 AHF team consulted for management of post-cardiotomy shock.  Assessment/Plan  1. Valvular heart disease - s/p MVR/AVR 3/19 - post-op TEE: EF was 30-40% and the valves were well seated and the mitral had a mean of 41mmHg and the aortic had a mean of 62mmHg  - post-op TTE Post-op Echo EF 30-35%, RV mod reduced, prosthetic valves ok    Acute on chronic systolic CHF -  Echo in 0000000 EF 40-45%, mild RV dilation, moderate pulmonary HTN, mod-severe MR and moderate-severe TR.  - RHC/LHC (1/24) with elevated left and right heart filling pressures, pulmonary venous hypertension, preserved cardiac output.  NICM. Cause is uncertain, ?valvular disease.  - Post-op Echo EF 30-35%, RV mod reduced. - On norepi 17 -> 12-> 9  and epi 8 -> 5 -> 2 + milrinone 0.25  - (Keep EPI at 2 today DO NOT WEAN NOREPI below 5 today)  - co-ox 68%.  - Volume status slightly elevated, weight up 5 lbs. CVPs ordered. Start lasix 40mg  IV x1, follow  response. Plan to start daily Torsemide tomorrow.  - Improving slowly but still struggling with RV failure post-op.  - Start digoxin .125 mg - Other GDMT on hold with need for pressors/inotropes - D/w Dr. Cyndia Bent at bedside   Pulmonary hypertension - Group 2 pulmonary venous hypertension.    Hyponatremia - Resolved  H/o TIA/CVA - On plavix at home, anticoagulation per TCTS   CKD 3a, h/o AKI - stable Scr 0.78 - avoid hypotension - daily BMET    7. Thrombocytopenia - improving  8. Hypokalemia/hypomag - supp  9. Accelerated junctional rhythm (vs sinus with long 1AVB) - decreased amio to 200 bid - now in sinus tach. Watch with addition of digoxin  10. Anemia - Hgb 6.6 this morning - Getting 1uPRBc   Length of Stay: [place], NP  05/13/2022, 7:26 AM  Advanced Heart Failure Team Pager [phone] (M-F; 7a - 5p)  Please contact [facility] for night-coverage after hours (5p -7a ) and weekends on [email or web address]

## 2022-05-13 NOTE — Progress Notes (Signed)
[id number]       [employer] [id number]             [phone]      6 Days Post-Op  Procedure(s) (LRB): AORTIC VALVE REPLACEMENT (AVR) USING INSPIRIS VALVE SIZE 21MM (N/A) MITRAL VALVE (MV) REPLACEMENT USING MOSAIC 310 CINCH SIZE 31MM (N/A) TRANSESOPHAGEAL ECHOCARDIOGRAM (N/A)   Total Length of Stay:  LOS: 6 days   SUBJECTIVE: Feels well Ambulated this am.  Urine output better than charted  Vitals:   05/13/22 0600 05/13/22 0650  BP: 109/70   Pulse: (!) 102   Resp: (!) 27   Temp:  98.1 F (36.7 C)  SpO2: 99%     Intake/Output      03/24 0701 03/25 0700   P.O. 720   I.V. (mL/kg) 587.5 (9)   Total Intake(mL/kg) 1307.5 (20)   Urine (mL/kg/hr) 850 (0.5)   Total Output 850   Net +457.5       Urine Occurrence 1 x       sodium chloride     sodium chloride     sodium chloride 10 mL/hr at 05/10/22 1919   epinephrine 2 mcg/min (05/13/22 0600)   lactated ringers 20 mL/hr at 05/10/22 0925   milrinone 0.25 mcg/kg/min (05/13/22 0600)   norepinephrine (LEVOPHED) Adult infusion 9 mcg/min (05/13/22 0600)    CBC    Component Value Date/Time   WBC 9.4 05/13/2022 0347   RBC 2.20 (L) 05/13/2022 0347   HGB 6.6 (LL) 05/13/2022 0347   HGB 12.3 02/26/2022 1546   HCT 20.5 (L) 05/13/2022 0347   HCT 36.8 02/26/2022 1546   PLT 141 (L) 05/13/2022 0347   PLT 277 02/26/2022 1546   MCV 93.2 05/13/2022 0347   MCV 93 02/26/2022 1546   MCH 30.0 05/13/2022 0347   MCHC 32.2 05/13/2022 0347   RDW 15.2 05/13/2022 0347   RDW 14.0 02/26/2022 1546   LYMPHSABS 1.5 05/12/2022 0402   MONOABS 1.4 (H) 05/12/2022 0402   EOSABS 0.4 05/12/2022 0402   BASOSABS 0.0 05/12/2022 0402   CMP     Component Value Date/Time   NA 131 (L) 05/13/2022 0347   NA 143 02/26/2022 1546   K 4.1 05/13/2022 0347   CL 98 05/13/2022 0347   CO2 29 05/13/2022 0347   GLUCOSE 164 (H) 05/13/2022 0347   BUN 9 05/13/2022 0347   BUN 12 02/26/2022 1546   CREATININE 0.78 05/13/2022 0347    CALCIUM 7.8 (L) 05/13/2022 0347   PROT 6.7 05/03/2022 1100   PROT 6.8 02/26/2022 1546   ALBUMIN 1.8 (L) 05/10/2022 0421   ALBUMIN 4.4 02/26/2022 1546   AST 28 05/03/2022 1100   ALT 21 05/03/2022 1100   ALKPHOS 94 05/03/2022 1100   BILITOT 1.6 (H) 05/03/2022 1100   BILITOT 1.6 (H) 02/26/2022 1546   GFRNONAA >60 05/13/2022 0347   GFRAA 66 06/17/2019 1319   ABG    Component Value Date/Time   PHART 7.407 05/08/2022 1522   PCO2ART 35.4 05/08/2022 1522   PO2ART 101 05/08/2022 1522   HCO3 22.1 05/08/2022 1522   TCO2 23 05/08/2022 1522   ACIDBASEDEF 2.0 05/08/2022 1522   O2SAT 67.7 05/13/2022 0347   CBG (last 3)  Recent Labs    05/12/22 1555 05/12/22 2156 05/13/22 0642  GLUCAP 152* 164* 130*  EXAM Lungs:overall clear Card: tachy. No murmur Incision:clean Ext: no edema warm Neuro: alert and no focal deficits   ASSESSMENT: POD #6 AVR/MVR Hemodynamics still  requiring inotropes but lower past 24 hours. Coox ok. Continue to wean Heme: Hct low. Will transfuse Plts: improved Cr: stable hold diuresis today Cont ambulation   Coralie Common, MD 05/13/2022

## 2022-05-14 ENCOUNTER — Ambulatory Visit: Payer: Medicare HMO

## 2022-05-14 DIAGNOSIS — R739 Hyperglycemia, unspecified: Secondary | ICD-10-CM | POA: Diagnosis not present

## 2022-05-14 DIAGNOSIS — I480 Paroxysmal atrial fibrillation: Secondary | ICD-10-CM

## 2022-05-14 DIAGNOSIS — N1831 Chronic kidney disease, stage 3a: Secondary | ICD-10-CM

## 2022-05-14 DIAGNOSIS — E876 Hypokalemia: Secondary | ICD-10-CM

## 2022-05-14 DIAGNOSIS — Z952 Presence of prosthetic heart valve: Secondary | ICD-10-CM | POA: Diagnosis not present

## 2022-05-14 LAB — CBC
HCT: 23.6 % — ABNORMAL LOW (ref 36.0–46.0)
Hemoglobin: 7.9 g/dL — ABNORMAL LOW (ref 12.0–15.0)
MCH: 29.7 pg (ref 26.0–34.0)
MCHC: 33.5 g/dL (ref 30.0–36.0)
MCV: 88.7 fL (ref 80.0–100.0)
Platelets: 168 10*3/uL (ref 150–400)
RBC: 2.66 MIL/uL — ABNORMAL LOW (ref 3.87–5.11)
RDW: 15.8 % — ABNORMAL HIGH (ref 11.5–15.5)
WBC: 9.6 10*3/uL (ref 4.0–10.5)
nRBC: 0 % (ref 0.0–0.2)

## 2022-05-14 LAB — COOXEMETRY PANEL
Carboxyhemoglobin: 1.9 % — ABNORMAL HIGH (ref 0.5–1.5)
Carboxyhemoglobin: 1.7 % — ABNORMAL HIGH (ref 0.5–1.5)
Methemoglobin: <0.7 % (ref 0.0–1.5)
Methemoglobin: <0.7 % (ref 0.0–1.5)
O2 Saturation: 68.4 %
O2 Saturation: 50.8 %
Total hemoglobin: 8.3 g/dL — ABNORMAL LOW (ref 12.0–16.0)
Total hemoglobin: 8.5 g/dL — ABNORMAL LOW (ref 12.0–16.0)

## 2022-05-14 LAB — BASIC METABOLIC PANEL WITH GFR
Anion gap: 10 (ref 5–15)
BUN: 7 mg/dL — ABNORMAL LOW (ref 8–23)
CO2: 29 mmol/L (ref 22–32)
Calcium: 8.2 mg/dL — ABNORMAL LOW (ref 8.9–10.3)
Chloride: 94 mmol/L — ABNORMAL LOW (ref 98–111)
Creatinine, Ser: 0.94 mg/dL (ref 0.44–1.00)
GFR, Estimated: >60 mL/min
Glucose, Bld: 153 mg/dL — ABNORMAL HIGH (ref 70–99)
Potassium: 3.4 mmol/L — ABNORMAL LOW (ref 3.5–5.1)
Sodium: 133 mmol/L — ABNORMAL LOW (ref 135–145)

## 2022-05-14 LAB — TYPE AND SCREEN
ABO/RH(D): O POS
Antibody Screen: NEGATIVE
Unit division: 0

## 2022-05-14 LAB — GLUCOSE, CAPILLARY
Glucose-Capillary: 161 mg/dL — ABNORMAL HIGH (ref 70–99)
Glucose-Capillary: 107 mg/dL — ABNORMAL HIGH (ref 70–99)
Glucose-Capillary: 114 mg/dL — ABNORMAL HIGH (ref 70–99)
Glucose-Capillary: 111 mg/dL — ABNORMAL HIGH (ref 70–99)

## 2022-05-14 LAB — BPAM RBC
Blood Product Expiration Date: 202404202359
ISSUE DATE / TIME: 202403251044
Unit Type and Rh: 5100

## 2022-05-14 LAB — MAGNESIUM: Magnesium: 1.5 mg/dL — ABNORMAL LOW (ref 1.7–2.4)

## 2022-05-14 MED ORDER — AMIODARONE HCL 200 MG PO TABS
400.0000 mg | ORAL_TABLET | Freq: Two times a day (BID) | ORAL | Status: DC
  Administered 2022-05-14 – 2022-05-18 (×10): 400 mg via ORAL
  Filled 2022-05-14 (×10): qty 2

## 2022-05-14 MED ORDER — AMIODARONE IV BOLUS ONLY 150 MG/100ML
150.0000 mg | Freq: Once | INTRAVENOUS | Status: AC
  Administered 2022-05-14: 150 mg via INTRAVENOUS
  Filled 2022-05-14: qty 100

## 2022-05-14 MED ORDER — BOOST / RESOURCE BREEZE PO LIQD CUSTOM
1.0000 | Freq: Three times a day (TID) | ORAL | Status: DC
  Administered 2022-05-15 – 2022-05-19 (×11): 1 via ORAL
  Filled 2022-05-14: qty 1

## 2022-05-14 MED ORDER — MAGNESIUM SULFATE 4 GM/100ML IV SOLN
4.0000 g | Freq: Once | INTRAVENOUS | Status: AC
  Administered 2022-05-14: 4 g via INTRAVENOUS
  Filled 2022-05-14: qty 100

## 2022-05-14 MED ORDER — POTASSIUM CHLORIDE CRYS ER 20 MEQ PO TBCR
40.0000 meq | EXTENDED_RELEASE_TABLET | ORAL | Status: AC
  Administered 2022-05-14 (×2): 40 meq via ORAL
  Filled 2022-05-14 (×2): qty 2

## 2022-05-14 MED ORDER — FUROSEMIDE 10 MG/ML IJ SOLN
40.0000 mg | Freq: Once | INTRAMUSCULAR | Status: AC
  Administered 2022-05-14: 40 mg via INTRAVENOUS
  Filled 2022-05-14: qty 4

## 2022-05-14 NOTE — Progress Notes (Signed)
[address]       [employer] [id number]             [phone]      7 Days Post-Op  Procedure(s) (LRB): AORTIC VALVE REPLACEMENT (AVR) USING INSPIRIS VALVE SIZE 21MM (N/A) MITRAL VALVE (MV) REPLACEMENT USING MOSAIC 310 CINCH SIZE 31MM (N/A) TRANSESOPHAGEAL ECHOCARDIOGRAM (N/A)   Total Length of Stay:  LOS: 7 days   SUBJECTIVE: Pt feels well and slept Went into afib with PVCs last night  Vitals:   05/14/22 0600 05/14/22 0615  BP: 95/63 98/68  Pulse: 85 (!) 103  Resp: (!) 31 (!) 24  Temp:    SpO2: 97% 94%    Intake/Output      03/25 0701 03/26 0700   I.V. (mL/kg) 436.9 (6.5)   Blood 366   IV Piggyback 25.3   Total Intake(mL/kg) 828.2 (12.4)   Urine (mL/kg/hr) 1800 (1.1)   Stool 0   Total Output 1800   Net -971.8       Urine Occurrence 4 x   Stool Occurrence 3 x       sodium chloride     sodium chloride 10 mL/hr at 05/10/22 1919   epinephrine 2 mcg/min (05/14/22 0600)   lactated ringers 20 mL/hr at 05/10/22 0925   magnesium sulfate bolus IVPB 50 mL/hr at 05/14/22 0600   milrinone 0.125 mcg/kg/min (05/14/22 0600)   norepinephrine (LEVOPHED) Adult infusion 9.003 mcg/min (05/14/22 0600)    CBC    Component Value Date/Time   WBC 9.6 05/14/2022 0328   RBC 2.66 (L) 05/14/2022 0328   HGB 7.9 (L) 05/14/2022 0328   HGB 12.3 02/26/2022 1546   HCT 23.6 (L) 05/14/2022 0328   HCT 36.8 02/26/2022 1546   PLT 168 05/14/2022 0328   PLT 277 02/26/2022 1546   MCV 88.7 05/14/2022 0328   MCV 93 02/26/2022 1546   MCH 29.7 05/14/2022 0328   MCHC 33.5 05/14/2022 0328   RDW 15.8 (H) 05/14/2022 0328   RDW 14.0 02/26/2022 1546   LYMPHSABS 1.5 05/12/2022 0402   MONOABS 1.4 (H) 05/12/2022 0402   EOSABS 0.4 05/12/2022 0402   BASOSABS 0.0 05/12/2022 0402   CMP     Component Value Date/Time   NA 133 (L) 05/14/2022 0328   NA 143 02/26/2022 1546   K 3.4 (L) 05/14/2022 0328   CL 94 (L) 05/14/2022 0328   CO2 29 05/14/2022 0328   GLUCOSE 153 (H)  05/14/2022 0328   BUN 7 (L) 05/14/2022 0328   BUN 12 02/26/2022 1546   CREATININE 0.94 05/14/2022 0328   CALCIUM 8.2 (L) 05/14/2022 0328   PROT 6.7 05/03/2022 1100   PROT 6.8 02/26/2022 1546   ALBUMIN 1.8 (L) 05/10/2022 0421   ALBUMIN 4.4 02/26/2022 1546   AST 28 05/03/2022 1100   ALT 21 05/03/2022 1100   ALKPHOS 94 05/03/2022 1100   BILITOT 1.6 (H) 05/03/2022 1100   BILITOT 1.6 (H) 02/26/2022 1546   GFRNONAA >60 05/14/2022 0328   GFRAA 66 06/17/2019 1319   ABG    Component Value Date/Time   PHART 7.407 05/08/2022 1522   PCO2ART 35.4 05/08/2022 1522   PO2ART 101 05/08/2022 1522   HCO3 22.1 05/08/2022 1522   TCO2 23 05/08/2022 1522   ACIDBASEDEF 2.0 05/08/2022 1522   O2SAT 68.4 05/14/2022 0328   CBG (last 3)  Recent Labs    05/13/22 1619 05/13/22 2111 05/14/22 0652  GLUCAP 134* 143* 161*  EXAM Lungs: decreased at  bases Card: IRR  Incision: intact Ext: warm   ASSESSMENT: POD #7 sp AVR/MVR Hemodynamics with now afib. Will bolus amiodarone and leave increase po to 400 bid Continue to wean levo. Coox 68 hope to dc milrinone Renal: stable. Cont diuresis Pulm. Cont diuresis Leave in unit  Coralie Common, MD 05/14/2022

## 2022-05-14 NOTE — Progress Notes (Signed)
NAME:  Shannon [name], MRN:  [id number], DOB:  1950-09-10, LOS: 7 ADMISSION DATE:  05/07/2022, CONSULTATION DATE: 05/07/2022 REFERRING MD: Lavonna Monarch - TCTS CHIEF COMPLAINT: Status post valve replacement  History of Present Illness:  72 year old woman with past medical history of smoking, hypertension, GERD, and asthma.  She had been having some swallowing issues and was sent to cardiology for clearance prior to GI procedure and was found to have depressed LV function along with severe mitral and tricuspid regurgitation as well as moderate aortic insufficiency.  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  She was evaluated by cardiothoracic surgery who felt she was a good candidate for replacement/repair, and underwent elective aortic and mitral valve replacement on 3/19 under Dr. Tommi Rumps.    Postoperatively she was sent to the cardiac ICU for recovery on mechanical ventilation and PCCM was asked to evaluate for ICU care.  Pertinent Medical History:   Past Medical History:  Diagnosis Date   Anxiety    Arthritis    "right knee" (05/26/2014)   Asthma    CHF (congestive heart failure) (HCC)    chronic diastolic heart failure - sees Dr. Aundra Dubin   Chronic lower back pain    Dysrhythmia    irregular heart beat   GERD (gastroesophageal reflux disease)    HLD (hyperlipidemia)    Hypertension    Pneumonia    Stroke [facility])    TIA - ?2021   TIA (transient ischemic attack) 04/2018   Significant Hospital Events: Including procedures, antibiotic start and stop dates in addition to other pertinent events   3/19 MVR, AVR. 3/20 POD#1 from valve repair. Extubated ~0300. Slowly downtitrating pressors. Neo off, remains on Epi/NE. 3/21 POD#2. Bigeminy early AM with drop in CI to 1.4-1.6; Co-ox decreased to 49 from 60s. DDD Paced with significant improvement in index and Co-ox (61%). Epi weaned with drop in CI and pressures, increased back to 7. Diuresing BID. Echo with EF 30-35%, global hypokinesis,  moderate RV enlargement/dysfunction, s/p MVR. 3/22 POD#3, Pacing, weaning as able given septal dyssynchrony. CI improved 2.2, CVP 7 on Swan. Remains on milrinone 0.25, Epi 6, NE 8.   3/26 on milrinone 0.125, epi 2, nor epi 3  Interim History / Subjective:  Feeling well, appetite slowly improving.  She denies complaints.  Objective:  Blood pressure 98/68, pulse (!) 103, temperature 99.2 F (37.3 C), temperature source Oral, resp. rate (!) 24, height 5\' 6"  (1.676 m), weight 66.8 kg, SpO2 94 %. CVP:  [6 mmHg-9 mmHg] 6 mmHg      Intake/Output Summary (Last 24 hours) at 05/14/2022 0700 Last data filed at 05/14/2022 0600 Gross per 24 hour  Intake 828.18 ml  Output 1800 ml  Net -971.82 ml    Filed Weights   05/12/22 0600 05/13/22 0500 05/14/22 0500  Weight: 65.4 kg 68 kg 66.8 kg   Physical Examination: General: Elderly woman sitting up in the chair no acute distress HEENT: [place]/AT, eyes anicteric Neuro: Awake and alert, moving all extremities.  Able to sit forward easily in the chair.  Answering questions appropriately. CV: S1-S2, regular rhythm, regular rate PULM: Breathing comfortably on nasal cannula, weaned down to room air during my exam.  CTAB. 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  Continue milrinone for heart failure.  Epi 2 mcg. - Monitor on telemetry - Replete electrolytes, continue to closely monitor.  Hold diuretics today.  Hold GDMT until off inotropes.  Would hope that we can start spironolactone soon. - Digoxin - Aspirin, statin - Apixaban for anticoagulation per  TCTS.  Afib, paroxysmal -Continue amiodarone and Eliquis.  Unable to tolerate beta-blocker at this point with inotrope requirements. -Monitor on telemetry - Monitor electrolytes and replete as needed  Post operative respiratory insufficiency, resolved Hx asthma, emphysema -Continue weaning oxygen.  Appears that she can go without it at rest.  Will need to monitor her with sleeping and mobility. - Continue Dulera twice daily.  ?TIA History -A-fib prophylaxis  CKD at risk AKI post op, improved -Strict I's/O - Avoid nephrotoxic meds  HLD -Statin, Zetia  Hyperglycemia, controlled -SSI as needed - Goal blood glucose 140-180  Acute anemia, ABLA expected post-op Consumptive thrombocytopenia, expected post-op -Monitor, no current need for transfusion. -Monitor for signs of bleeding  Hyponatremia, possibly due to diuresis -Monitor  Hypokalemia - Repleted  Deconditioning -PT, OT -Out of bed mobility - Has been up in the chair throughout the day.  Best Practice (right click and "Reselect all SmartList Selections" daily)   Diet/type: Regular consistency (see orders) DVT prophylaxis: DOAC GI prophylaxis: PPI Lines: Central line- PICC Foley:  N/A Code Status:  full code Last date of multidisciplinary goals of care discussion [ ]   Critical care time:       Julian Hy, DO 05/14/22 11:10 AM [facility]  For contact information, see Amion. If no response to pager, please call PCCM consult pager. After hours, 7PM- 7AM, please call Elink.

## 2022-05-14 NOTE — Progress Notes (Signed)
Advanced Heart Failure Rounding Note  PCP-Cardiologist: Quay Burow, MD   Subjective:    S/p AVR/MVR. Post-op Echo EF 30-35%, RV mod reduced. Stable AV and MV prosthesis   POD # 7  Milrinone 0.25>.125 yesterday, Epi 2 and NE 3. (Weaning)  CVP 6, Co-ox 68%. S/p 40 IV lasix yesterday, -1.8 L UOP  Weight down 2 lbs, Scr .94. Hgb 7.9 (s/p 1uPRBC 3/25)  In chair, no complaints. Denies CP/SOB.  Objective:   Weight Range: 66.8 kg Body mass index is 23.77 kg/m.   Vital Signs:   Temp:  [98.2 F (36.8 C)-99.6 F (37.6 C)] 98.2 F (36.8 C) (03/26 0801) Pulse Rate:  [53-120] 103 (03/26 0615) Resp:  [14-33] 24 (03/26 0615) BP: (87-138)/(51-102) 98/68 (03/26 0615) SpO2:  [89 %-100 %] 94 % (03/26 0615) Weight:  [66.8 kg] 66.8 kg (03/26 0500) Last BM Date : 05/13/22  Weight change: Filed Weights   05/12/22 0600 05/13/22 0500 05/14/22 0500  Weight: 65.4 kg 68 kg 66.8 kg    Intake/Output:   Intake/Output Summary (Last 24 hours) at 05/14/2022 0808 Last data filed at 05/14/2022 0600 Gross per 24 hour  Intake 788.85 ml  Output 1800 ml  Net -1011.15 ml    Physical Exam    General:  well appearing.  No respiratory difficulty HEENT: normal Neck: supple. JVD ~6 cm. Carotids 2+ bilat; no bruits. No lymphadenopathy or thyromegaly appreciated. Cor: PMI nondisplaced. Regular rate & rhythm. No rubs, gallops or murmurs. Mid sternal incision healing Lungs: clear Abdomen: soft, nontender, nondistended. No hepatosplenomegaly. No bruits or masses. Good bowel sounds. Extremities: no cyanosis, clubbing, rash, edema. PICC RUE Neuro: alert & oriented x 3, cranial nerves grossly intact. moves all 4 extremities w/o difficulty. Affect pleasant.   Telemetry   NSR 90s-ST low 100s (Personally reviewed)    Labs    CBC Recent Labs    05/12/22 0402 05/13/22 0347 05/14/22 0328  WBC 10.6* 9.4 9.6  NEUTROABS 7.3  --   --   HGB 7.4* 6.6* 7.9*  HCT 22.7* 20.5* 23.6*  MCV 91.2 93.2 88.7   PLT 130* 141* [id number]   Basic Metabolic Panel Recent Labs    05/13/22 0347 05/14/22 0328  NA 131* 133*  K 4.1 3.4*  CL 98 94*  CO2 29 29  GLUCOSE 164* 153*  BUN 9 7*  CREATININE 0.78 0.94  CALCIUM 7.8* 8.2*  MG 2.0 1.5*   Liver Function Tests No results for input(s): "AST", "ALT", "ALKPHOS", "BILITOT", "PROT", "ALBUMIN" in the last 72 hours. No results for input(s): "LIPASE", "AMYLASE" in the last 72 hours. Cardiac Enzymes No results for input(s): "CKTOTAL", "CKMB", "CKMBINDEX", "TROPONINI" in the last 72 hours.  BNP: BNP (last 3 results) Recent Labs    03/18/22 1540  BNP 58.1    ProBNP (last 3 results) Recent Labs    02/26/22 1546  PROBNP 2,865*   D-Dimer No results for input(s): "DDIMER" in the last 72 hours. Hemoglobin A1C No results for input(s): "HGBA1C" in the last 72 hours. Fasting Lipid Panel No results for input(s): "CHOL", "HDL", "LDLCALC", "TRIG", "CHOLHDL", "LDLDIRECT" in the last 72 hours. Thyroid Function Tests No results for input(s): "TSH", "T4TOTAL", "T3FREE", "THYROIDAB" in the last 72 hours.  Invalid input(s): "FREET3"  Other results:  Imaging   DG CHEST PORT 1 VIEW  Result Date: 05/13/2022 CLINICAL DATA:  Acute on chronic congestive heart failure. EXAM: PORTABLE CHEST 1 VIEW COMPARISON:  May 12, 2022 FINDINGS: Tortuosity and calcific atherosclerotic disease of  the aorta. Postsurgical changes from aortic valve replacement. Right PICC line terminates at the expected location of the cavoatrial junction. Small bilateral pleural effusions, left greater than right. No definite airspace consolidation. Osseous structures are without acute abnormality. Soft tissues are grossly normal. IMPRESSION: 1. Small bilateral pleural effusions, left greater than right. 2. Tortuosity and calcific atherosclerotic disease of the aorta. Electronically Signed   By: Fidela Salisbury M.D.   On: 05/13/2022 09:10    Medications:   Scheduled Medications:  sodium  chloride   Intravenous Once   amiodarone  400 mg Oral BID   apixaban  5 mg Oral BID   aspirin EC  81 mg Oral Daily   atorvastatin  80 mg Oral q1800   bisacodyl  10 mg Oral Daily   Or   bisacodyl  10 mg Rectal Daily   busPIRone  10 mg Oral BID   Chlorhexidine Gluconate Cloth  6 each Topical Daily   digoxin  0.125 mg Oral Daily   docusate sodium  200 mg Oral Daily   ezetimibe  10 mg Oral Daily   Fe Fum-Vit C-Vit B12-FA  1 capsule Oral QPC breakfast   feeding supplement  237 mL Oral BID BM   FLUoxetine  20 mg Oral Daily   fluticasone  1 spray Each Nare Daily   insulin aspart  0-24 Units Subcutaneous TID AC & HS   mometasone-formoterol  2 puff Inhalation BID   montelukast  10 mg Oral QHS   pantoprazole  40 mg Oral Daily   potassium chloride  40 mEq Oral Q4H   sodium chloride flush  3 mL Intravenous Q12H    Infusions:  sodium chloride     sodium chloride 10 mL/hr at 05/10/22 1919   epinephrine 2 mcg/min (05/14/22 0600)   lactated ringers 20 mL/hr at 05/10/22 0925   milrinone 0.125 mcg/kg/min (05/14/22 0600)   norepinephrine (LEVOPHED) Adult infusion 3 mcg/min (05/14/22 0743)    PRN Medications: albuterol, ondansetron (ZOFRAN) IV, mouth rinse, oxyCODONE, sodium chloride flush, traMADol  Patient Profile   Ms. Cecilio is a 72 y.o. 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 AHF team consulted for management of post-cardiotomy shock.  Assessment/Plan  1. Valvular heart disease - s/p MVR/AVR 3/19 - post-op TEE: EF was 30-40% and the valves were well seated and the mitral had a mean of 65mmHg and the aortic had a mean of 21mmHg  - post-op TTE Post-op Echo EF 30-35%, RV mod reduced, prosthetic valves ok    Acute on chronic systolic CHF -  Echo in 0000000 EF 40-45%, mild RV dilation, moderate pulmonary HTN, mod-severe MR and moderate-severe TR.  - RHC/LHC (1/24) with elevated left and right heart  filling pressures, pulmonary venous hypertension, preserved cardiac output.  NICM. Cause is uncertain, ?valvular disease.  - Post-op Echo EF 30-35%, RV mod reduced. - On norepi 3  and epi 2 + milrinone 0.125 (milrinone weaned yesterday) - Keep EPI at 2 today, wean levo - co-ox 68%.  - Volume status improved, weight down 2lbs. CVP 6. Hold diuretics today, plan to start PO tomorrow.  - Improving slowly but still struggling with RV failure post-op.  - Continue digoxin .125 mg - Other GDMT on hold with need for pressors/inotropes - D/w Dr. Cyndia Bent at bedside   Pulmonary hypertension - Group 2 pulmonary venous hypertension.    Hyponatremia - 133 today  H/o TIA/CVA - On plavix  at home, anticoagulation per TCTS   CKD 3a, h/o AKI - stable Scr 0.94 - avoid hypotension - daily BMET    7. Thrombocytopenia - improving  8. Hypokalemia/hypomag - supp  9. Accelerated junctional rhythm (vs sinus with long 1AVB) - Continue amio to 200 bid - now in NSR/ST. Watch with addition of digoxin  10. Anemia - Hgb 6.6> 7.9 - s/p 1uPRBc 3/25  Length of Stay: Harper, NP  05/14/2022, 8:08 AM  Advanced Heart Failure Team Pager [phone] (M-F; 7a - 5p)  Please contact [facility] for night-coverage after hours (5p -7a ) and weekends on [email or web address]

## 2022-05-15 DIAGNOSIS — D62 Acute posthemorrhagic anemia: Secondary | ICD-10-CM | POA: Diagnosis not present

## 2022-05-15 DIAGNOSIS — E871 Hypo-osmolality and hyponatremia: Secondary | ICD-10-CM | POA: Diagnosis not present

## 2022-05-15 DIAGNOSIS — Z952 Presence of prosthetic heart valve: Secondary | ICD-10-CM | POA: Diagnosis not present

## 2022-05-15 DIAGNOSIS — R5381 Other malaise: Secondary | ICD-10-CM | POA: Diagnosis not present

## 2022-05-15 LAB — BASIC METABOLIC PANEL
Anion gap: 12 (ref 5–15)
BUN: 5 mg/dL — ABNORMAL LOW (ref 8–23)
CO2: 28 mmol/L (ref 22–32)
Calcium: 8.6 mg/dL — ABNORMAL LOW (ref 8.9–10.3)
Chloride: 94 mmol/L — ABNORMAL LOW (ref 98–111)
Creatinine, Ser: 0.85 mg/dL (ref 0.44–1.00)
GFR, Estimated: >60 mL/min (ref >60)
Glucose, Bld: 135 mg/dL — ABNORMAL HIGH (ref 70–99)
Potassium: 3.7 mmol/L (ref 3.5–5.1)
Sodium: 134 mmol/L — ABNORMAL LOW (ref 135–145)

## 2022-05-15 LAB — COOXEMETRY PANEL
Carboxyhemoglobin: 2.6 % — ABNORMAL HIGH (ref 0.5–1.5)
Carboxyhemoglobin: 1 % (ref 0.5–1.5)
Methemoglobin: <0.7 % (ref 0.0–1.5)
Methemoglobin: 1.2 % (ref 0.0–1.5)
O2 Saturation: 61.5 %
O2 Saturation: 54.9 %
Total hemoglobin: 7.9 g/dL — ABNORMAL LOW (ref 12.0–16.0)
Total hemoglobin: 8.7 g/dL — ABNORMAL LOW (ref 12.0–16.0)

## 2022-05-15 LAB — GLUCOSE, CAPILLARY
Glucose-Capillary: 133 mg/dL — ABNORMAL HIGH (ref 70–99)
Glucose-Capillary: 127 mg/dL — ABNORMAL HIGH (ref 70–99)
Glucose-Capillary: 92 mg/dL (ref 70–99)
Glucose-Capillary: 147 mg/dL — ABNORMAL HIGH (ref 70–99)

## 2022-05-15 LAB — MAGNESIUM: Magnesium: 1.9 mg/dL (ref 1.7–2.4)

## 2022-05-15 MED ORDER — MAGNESIUM SULFATE 2 GM/50ML IV SOLN
2.0000 g | Freq: Once | INTRAVENOUS | Status: AC
  Administered 2022-05-15: 2 g via INTRAVENOUS
  Filled 2022-05-15: qty 50

## 2022-05-15 MED ORDER — FUROSEMIDE 10 MG/ML IJ SOLN
40.0000 mg | Freq: Once | INTRAMUSCULAR | Status: AC
  Administered 2022-05-15: 40 mg via INTRAVENOUS
  Filled 2022-05-15: qty 4

## 2022-05-15 MED ORDER — POTASSIUM CHLORIDE CRYS ER 20 MEQ PO TBCR
40.0000 meq | EXTENDED_RELEASE_TABLET | Freq: Once | ORAL | Status: AC
  Administered 2022-05-15: 40 meq via ORAL
  Filled 2022-05-15: qty 2

## 2022-05-15 NOTE — Progress Notes (Signed)
[address]       [employer] [address]             [phone]      8 Days Post-Op  Procedure(s) (LRB): AORTIC VALVE REPLACEMENT (AVR) USING INSPIRIS VALVE SIZE 21MM (N/A) MITRAL VALVE (MV) REPLACEMENT USING MOSAIC 310 CINCH SIZE 31MM (N/A) TRANSESOPHAGEAL ECHOCARDIOGRAM (N/A)  Total Length of Stay:  LOS: 8 days   SUBJECTIVE: Feels well  Vitals:   05/15/22 0500 05/15/22 0600  BP: 112/63 115/67  Pulse: 94 95  Resp: 17 20  Temp:    SpO2: 94% 94%    Intake/Output      03/26 0701 03/27 0700 03/27 0701 03/28 0700   P.O. 580    I.V. (mL/kg) 313 (4.8)    Blood     IV Piggyback     Total Intake(mL/kg) 893 (13.6)    Urine (mL/kg/hr) 1775 (1.1)    Stool     Total Output 1775    Net -882         Urine Occurrence 1 x        sodium chloride     sodium chloride 10 mL/hr at 05/10/22 1919   epinephrine 2 mcg/min (05/15/22 0600)   lactated ringers 20 mL/hr at 05/10/22 0925   magnesium sulfate bolus IVPB     milrinone 0.125 mcg/kg/min (05/15/22 0600)   norepinephrine (LEVOPHED) Adult infusion Stopped (05/14/22 1232)    CBC    Component Value Date/Time   WBC 9.6 05/14/2022 0328   RBC 2.66 (L) 05/14/2022 0328   HGB 7.9 (L) 05/14/2022 0328   HGB 12.3 02/26/2022 1546   HCT 23.6 (L) 05/14/2022 0328   HCT 36.8 02/26/2022 1546   PLT 168 05/14/2022 0328   PLT 277 02/26/2022 1546   MCV 88.7 05/14/2022 0328   MCV 93 02/26/2022 1546   MCH 29.7 05/14/2022 0328   MCHC 33.5 05/14/2022 0328   RDW 15.8 (H) 05/14/2022 0328   RDW 14.0 02/26/2022 1546   LYMPHSABS 1.5 05/12/2022 0402   MONOABS 1.4 (H) 05/12/2022 0402   EOSABS 0.4 05/12/2022 0402   BASOSABS 0.0 05/12/2022 0402   CMP     Component Value Date/Time   NA 134 (L) 05/15/2022 0519   NA 143 02/26/2022 1546   K 3.7 05/15/2022 0519   CL 94 (L) 05/15/2022 0519   CO2 28 05/15/2022 0519   GLUCOSE 135 (H) 05/15/2022 0519   BUN 5 (L) 05/15/2022 0519   BUN 12 02/26/2022 1546   CREATININE 0.85  05/15/2022 0519   CALCIUM 8.6 (L) 05/15/2022 0519   PROT 6.7 05/03/2022 1100   PROT 6.8 02/26/2022 1546   ALBUMIN 1.8 (L) 05/10/2022 0421   ALBUMIN 4.4 02/26/2022 1546   AST 28 05/03/2022 1100   ALT 21 05/03/2022 1100   ALKPHOS 94 05/03/2022 1100   BILITOT 1.6 (H) 05/03/2022 1100   BILITOT 1.6 (H) 02/26/2022 1546   GFRNONAA >60 05/15/2022 0519   GFRAA 66 06/17/2019 1319   ABG    Component Value Date/Time   PHART 7.407 05/08/2022 1522   PCO2ART 35.4 05/08/2022 1522   PO2ART 101 05/08/2022 1522   HCO3 22.1 05/08/2022 1522   TCO2 23 05/08/2022 1522   ACIDBASEDEF 2.0 05/08/2022 1522   O2SAT 61.5 05/15/2022 0519   CBG (last 3)  Recent Labs    05/14/22 1632 05/14/22 2117 05/15/22 0628  GLUCAP 114* 111* 133*  Exam Lungs: overall clear Card: RR  Ext: warm Neuro:  alert and no focal deficits   ASSESSMENT: POD #8 sp MVR repair/MAZE Overall continues to improve. Off Levo and on low dose epi and milrinone Coox 60 this am. Hopefully remove milrinone Continue diuresis Disposition eventually she wants to go home. Will need to get more mobile    Coralie Common, MD 05/15/2022

## 2022-05-15 NOTE — Progress Notes (Signed)
TCTS Progress Note: 8 Days Post-Op Procedure(s) (LRB): AORTIC VALVE REPLACEMENT (AVR) USING INSPIRIS VALVE SIZE 21MM (N/A) MITRAL VALVE (MV) REPLACEMENT USING MOSAIC 310 CINCH SIZE 31MM (N/A) TRANSESOPHAGEAL ECHOCARDIOGRAM (N/A)  LOS: 8 days   Looks well.  Room air, comfortable     Latest Ref Rng & Units 05/14/2022    3:28 AM 05/13/2022    3:47 AM 05/12/2022    4:02 AM  CBC  WBC 4.0 - 10.5 K/uL 9.6  9.4  10.6   Hemoglobin 12.0 - 15.0 g/dL 7.9  6.6  7.4   Hematocrit 36.0 - 46.0 % 23.6  20.5  22.7   Platelets 150 - 400 K/uL 168  141  130        Latest Ref Rng & Units 05/15/2022    5:19 AM 05/14/2022    3:28 AM 05/13/2022    3:47 AM  CMP  Glucose 70 - 99 mg/dL 135  153  164   BUN 8 - 23 mg/dL 5  7  9    Creatinine 0.44 - 1.00 mg/dL 0.85  0.94  0.78   Sodium 135 - 145 mmol/L 134  133  131   Potassium 3.5 - 5.1 mmol/L 3.7  3.4  4.1   Chloride 98 - 111 mmol/L 94  94  98   CO2 22 - 32 mmol/L 28  29  29    Calcium 8.9 - 10.3 mg/dL 8.6  8.2  7.8     ABG    Component Value Date/Time   PHART 7.407 05/08/2022 1522   PCO2ART 35.4 05/08/2022 1522   PO2ART 101 05/08/2022 1522   HCO3 22.1 05/08/2022 1522   TCO2 23 05/08/2022 1522   ACIDBASEDEF 2.0 05/08/2022 1522   O2SAT 54.9 05/15/2022 1455    FiO2 (%):  [21 %] 21 %

## 2022-05-15 NOTE — Progress Notes (Signed)
NAME:  Shannon [name], MRN:  [id number], DOB:  29-Mar-1950, LOS: 8 ADMISSION DATE:  05/07/2022, CONSULTATION DATE: 05/07/2022 REFERRING MD: Lavonna Monarch - TCTS CHIEF COMPLAINT: Status post valve replacement  History of Present Illness:  72 year old woman with past medical history of smoking, hypertension, GERD, and asthma.  She had been having some swallowing issues and was sent to cardiology for clearance prior to GI procedure and was found to have depressed LV function along with severe mitral and tricuspid regurgitation as well as moderate aortic insufficiency.  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  She was evaluated by cardiothoracic surgery who felt she was a good candidate for replacement/repair, and underwent elective aortic and mitral valve replacement on 3/19 under Dr. Tommi Rumps.    Postoperatively she was sent to the cardiac ICU for recovery on mechanical ventilation and PCCM was asked to evaluate for ICU care.  Pertinent Medical History:   Past Medical History:  Diagnosis Date   Anxiety    Arthritis    "right knee" (05/26/2014)   Asthma    CHF (congestive heart failure) (HCC)    chronic diastolic heart failure - sees Dr. Aundra Dubin   Chronic lower back pain    Dysrhythmia    irregular heart beat   GERD (gastroesophageal reflux disease)    HLD (hyperlipidemia)    Hypertension    Pneumonia    Stroke [facility])    TIA - ?2021   TIA (transient ischemic attack) 04/2018   Significant Hospital Events: Including procedures, antibiotic start and stop dates in addition to other pertinent events   3/19 MVR, AVR. 3/20 POD#1 from valve repair. Extubated ~0300. Slowly downtitrating pressors. Neo off, remains on Epi/NE. 3/21 POD#2. Bigeminy early AM with drop in CI to 1.4-1.6; Co-ox decreased to 49 from 60s. DDD Paced with significant improvement in index and Co-ox (61%). Epi weaned with drop in CI and pressures, increased back to 7. Diuresing BID. Echo with EF 30-35%, global hypokinesis,  moderate RV enlargement/dysfunction, s/p MVR. 3/22 POD#3, Pacing, weaning as able given septal dyssynchrony. CI improved 2.2, CVP 7 on [place]. Remains on milrinone 0.25, Epi 6, NE 8.   3/26 on milrinone 0.125, epi 2, nor epi 3  Interim History / Subjective:  No complaints, feels well.  Objective:  Blood pressure 105/64, pulse 88, temperature 97.8 F (36.6 C), temperature source Oral, resp. rate (!) 26, height 5\' 6"  (1.676 m), weight 65.8 kg, SpO2 95 %. CVP:  [6 mmHg-12 mmHg] 7 mmHg  FiO2 (%):  [21 %] 21 %   Intake/Output Summary (Last 24 hours) at 05/15/2022 1728 Last data filed at 05/15/2022 1600 Gross per 24 hour  Intake 751.45 ml  Output 1000 ml  Net -248.55 ml    Filed Weights   05/13/22 0500 05/14/22 0500 05/15/22 0500  Weight: 68 kg 66.8 kg 65.8 kg   Physical Examination: General: Elderly woman sitting up in the chair no acute distress watching television HEENT: [place]/AT, eyes anicteric Neuro: Awake, alert, moving all extremities.  Answering questions appropriately with normal speech. CV: S1-S2, regular rate and rhythm PULM: PEEP and comfortably on [place], CTAB GI: soft, NT Extremities: No edema, no cyanosis or clubbing Skin: Warm, dry, sternal incision healing well.  Coox exceed 2% Na+  134 Potassium 3.7 BUN 5 Cr 0.85   Resolved problems list:   Assessment & Plan:   Severe mitral regurgitation, moderate aortic regurgitation- S/p open MVR, AVR on 3/19  Acute on chronic HFrEF WHO group 2 PH -Weaning off  inotropes per heart failure today.  Afternoon follow-up Co. oximetry. - Telemetry monitoring - Monitor electrolytes and replete as needed - Continue digoxin, aspirin, statin - Apixaban -Hopefully can resume PTA Farxiga, spironolactone soon - Continue holding PTA Coreg and losartan due to low normal blood pressures  Afib, paroxysmal - Amiodarone and digoxin for rate control, apixaban for anticoagulation - Telemetry my during - Replete electrolytes as needed  Post  operative respiratory insufficiency, resolved Hx asthma, emphysema -Pulmonary hygiene, wean oxygen as able - Continue Dulera twice daily.  ?TIA History - Eliquis for A-fib prophylaxis  CKD at risk AKI post op, improved - Strict I's/O - Renally dose meds and avoid nephrotoxins  HLD - Zetia, statin  Hyperglycemia, controlled - SSI as needed, minimal requirements - Goal blood glucose 140 and 180  Acute anemia, ABLA expected post-op Consumptive thrombocytopenia, expected post-op - Monitor periodically, no current need for transfusion.  Monitor for bleeding.  Hyponatremia, possibly due to diuresis -Monitor  Hypokalemia, resolved - Monitor  Deconditioning - PT, OT, mobility as able -Does a great job spending the day in the chair  Best Practice (right click and "Reselect all SmartList Selections" daily)   Diet/type: Regular consistency (see orders) DVT prophylaxis: DOAC GI prophylaxis: PPI--PTA med Lines: Central line- PICC Foley:  N/A Code Status:  full code Last date of multidisciplinary goals of care discussion [ ]   Critical care time:       Julian Hy, DO 05/15/22 5:37 PM [facility]  For contact information, see Amion. If no response to pager, please call PCCM consult pager. After hours, 7PM- 7AM, please call Elink.

## 2022-05-15 NOTE — Progress Notes (Signed)
Advanced Heart Failure Rounding Note  PCP-Cardiologist: Quay Burow, MD   Subjective:    S/p AVR/MVR. Post-op Echo EF 30-35%, RV mod reduced. Stable AV and MV prosthesis   POD # 8  Off NE. Remains on Epi 2 + Milrinone 0.125.  Co-ox 62%  Continues to diurese w/ IV Lasix. Wt down another 2 lb. Scr stable 0.85. CVP 6.   OOB, sitting up in chair. Feels ok, denies dyspnea. No CP. Eating breakfast and appetite improving. Ambulated some yesterday and did ok.    Objective:   Weight Range: 65.8 kg Body mass index is 23.41 kg/m.   Vital Signs:   Temp:  [97.4 F (36.3 C)-99.1 F (37.3 C)] 97.4 F (36.3 C) (03/27 0400) Pulse Rate:  [88-100] 97 (03/27 0700) Resp:  [16-31] 16 (03/27 0700) BP: (84-116)/(45-93) 100/69 (03/27 0700) SpO2:  [88 %-98 %] 93 % (03/27 0700) FiO2 (%):  [21 %] 21 % (03/26 1936) Weight:  [65.8 kg] 65.8 kg (03/27 0500) Last BM Date : 05/13/22  Weight change: Filed Weights   05/13/22 0500 05/14/22 0500 05/15/22 0500  Weight: 68 kg 66.8 kg 65.8 kg    Intake/Output:   Intake/Output Summary (Last 24 hours) at 05/15/2022 0734 Last data filed at 05/15/2022 0700 Gross per 24 hour  Intake 901.65 ml  Output 1775 ml  Net -873.35 ml    Physical Exam    CVP 6  General:  Well appearing. No respiratory difficulty HEENT: normal Neck: supple. JVD 6 cm. Carotids 2+ bilat; no bruits. No lymphadenopathy or thyromegaly appreciated. Cor: PMI nondisplaced. Regular rate & rhythm. No rubs, gallops or murmurs. Sternotomy site stable  Lungs: decreases BS at the bases bilaterally. No wheezing  Abdomen: soft, nontender, nondistended. No hepatosplenomegaly. No bruits or masses. Good bowel sounds. Extremities: no cyanosis, clubbing, rash, edema + RUE PICC Neuro: alert & oriented x 3, cranial nerves grossly intact. moves all 4 extremities w/o difficulty. Affect pleasant.   Telemetry   ? Junctional rhythm 80s  (Personally reviewed)    Labs    CBC Recent Labs     05/13/22 0347 05/14/22 0328  WBC 9.4 9.6  HGB 6.6* 7.9*  HCT 20.5* 23.6*  MCV 93.2 88.7  PLT 141* [id number]   Basic Metabolic Panel Recent Labs    05/14/22 0328 05/15/22 0519  NA 133* 134*  K 3.4* 3.7  CL 94* 94*  CO2 29 28  GLUCOSE 153* 135*  BUN 7* 5*  CREATININE 0.94 0.85  CALCIUM 8.2* 8.6*  MG 1.5* 1.9   Liver Function Tests No results for input(s): "AST", "ALT", "ALKPHOS", "BILITOT", "PROT", "ALBUMIN" in the last 72 hours. No results for input(s): "LIPASE", "AMYLASE" in the last 72 hours. Cardiac Enzymes No results for input(s): "CKTOTAL", "CKMB", "CKMBINDEX", "TROPONINI" in the last 72 hours.  BNP: BNP (last 3 results) Recent Labs    03/18/22 1540  BNP 58.1    ProBNP (last 3 results) Recent Labs    02/26/22 1546  PROBNP 2,865*   D-Dimer No results for input(s): "DDIMER" in the last 72 hours. Hemoglobin A1C No results for input(s): "HGBA1C" in the last 72 hours. Fasting Lipid Panel No results for input(s): "CHOL", "HDL", "LDLCALC", "TRIG", "CHOLHDL", "LDLDIRECT" in the last 72 hours. Thyroid Function Tests No results for input(s): "TSH", "T4TOTAL", "T3FREE", "THYROIDAB" in the last 72 hours.  Invalid input(s): "FREET3"  Other results:  Imaging   No results found.  Medications:   Scheduled Medications:  sodium chloride   Intravenous Once  amiodarone  400 mg Oral BID   apixaban  5 mg Oral BID   aspirin EC  81 mg Oral Daily   atorvastatin  80 mg Oral q1800   bisacodyl  10 mg Oral Daily   Or   bisacodyl  10 mg Rectal Daily   busPIRone  10 mg Oral BID   Chlorhexidine Gluconate Cloth  6 each Topical Daily   digoxin  0.125 mg Oral Daily   docusate sodium  200 mg Oral Daily   ezetimibe  10 mg Oral Daily   Fe Fum-Vit C-Vit B12-FA  1 capsule Oral QPC breakfast   feeding supplement  1 Container Oral TID BM   FLUoxetine  20 mg Oral Daily   fluticasone  1 spray Each Nare Daily   furosemide  40 mg Intravenous Once   insulin aspart  0-24 Units  Subcutaneous TID AC & HS   mometasone-formoterol  2 puff Inhalation BID   montelukast  10 mg Oral QHS   pantoprazole  40 mg Oral Daily   potassium chloride  40 mEq Oral Once   sodium chloride flush  3 mL Intravenous Q12H    Infusions:  sodium chloride     sodium chloride 10 mL/hr at 05/10/22 1919   epinephrine 2 mcg/min (05/15/22 0700)   lactated ringers 20 mL/hr at 05/10/22 [id number]   magnesium sulfate bolus IVPB     milrinone 0.125 mcg/kg/min (05/15/22 0700)   norepinephrine (LEVOPHED) Adult infusion Stopped (05/14/22 1232)    PRN Medications: albuterol, ondansetron (ZOFRAN) IV, mouth rinse, oxyCODONE, sodium chloride flush, traMADol  Patient Profile   Ms. Portela is a 72 y.o. 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 AHF team consulted for management of post-cardiotomy shock.  Assessment/Plan  1. Valvular heart disease - s/p MVR/AVR 3/19 - post-op TEE: EF was 30-40% and the valves were well seated and the mitral had a mean of 68mmHg and the aortic had a mean of 53mmHg  - post-op TTE Post-op Echo EF 30-35%, RV mod reduced, prosthetic valves ok    Acute on chronic systolic CHF -  Echo in 0000000 EF 40-45%, mild RV dilation, moderate pulmonary HTN, mod-severe MR and moderate-severe TR.  - RHC/LHC (1/24) with elevated left and right heart filling pressures, pulmonary venous hypertension, preserved cardiac output.  NICM. Cause is uncertain, ?valvular disease.  - Post-op Echo EF 30-35%, RV mod reduced. - Off NE. On epi 2 + milrinone 0.125 - co-ox 62%. Volume status improving. CVP 6. SCr stable 0.85  - stop milrinone today and follow co-ox  - Continue digoxin 0.125 mg - start spiro 12.5 mg daily   Pulmonary hypertension - Group 2 pulmonary venous hypertension.    Hyponatremia - 134 today  H/o TIA/CVA - On plavix at home, anticoagulation per TCTS   CKD 3a, h/o AKI - stable Scr 0.85 - avoid  hypotension - daily BMET    7. Thrombocytopenia - improving  8. Hypokalemia/hypomag - K 3.7, Mg 1.9  - supp w/ diuresis   9. Accelerated junctional rhythm (vs sinus with long 1AVB) - Continue amio to 200 bid - now in NSR/ST. Watch with addition of digoxin  10. Anemia - Hgb 6.6> 7.9 - s/p 1uPRBc 3/25  Length of Stay: [address] [place]  05/15/2022, 7:34 AM  Advanced Heart Failure Team Pager [phone] (M-F; 7a - 5p)  Please contact [facility] for night-coverage after hours (5p -7a )  and weekends on [email or web address]

## 2022-05-16 DIAGNOSIS — Z952 Presence of prosthetic heart valve: Secondary | ICD-10-CM | POA: Diagnosis not present

## 2022-05-16 DIAGNOSIS — E871 Hypo-osmolality and hyponatremia: Secondary | ICD-10-CM | POA: Diagnosis not present

## 2022-05-16 DIAGNOSIS — M109 Gout, unspecified: Secondary | ICD-10-CM

## 2022-05-16 LAB — BASIC METABOLIC PANEL
Anion gap: 9 (ref 5–15)
BUN: 8 mg/dL (ref 8–23)
CO2: 27 mmol/L (ref 22–32)
Calcium: 8.4 mg/dL — ABNORMAL LOW (ref 8.9–10.3)
Chloride: 97 mmol/L — ABNORMAL LOW (ref 98–111)
Creatinine, Ser: 0.94 mg/dL (ref 0.44–1.00)
GFR, Estimated: >60 mL/min (ref >60)
Glucose, Bld: 108 mg/dL — ABNORMAL HIGH (ref 70–99)
Potassium: 3.9 mmol/L (ref 3.5–5.1)
Sodium: 133 mmol/L — ABNORMAL LOW (ref 135–145)

## 2022-05-16 LAB — CBC
HCT: 28.1 % — ABNORMAL LOW (ref 36.0–46.0)
Hemoglobin: 9.2 g/dL — ABNORMAL LOW (ref 12.0–15.0)
MCH: 29.8 pg (ref 26.0–34.0)
MCHC: 32.7 g/dL (ref 30.0–36.0)
MCV: 90.9 fL (ref 80.0–100.0)
Platelets: 363 10*3/uL (ref 150–400)
RBC: 3.09 MIL/uL — ABNORMAL LOW (ref 3.87–5.11)
RDW: 15.9 % — ABNORMAL HIGH (ref 11.5–15.5)
WBC: 11.4 10*3/uL — ABNORMAL HIGH (ref 4.0–10.5)
nRBC: 0 % (ref 0.0–0.2)

## 2022-05-16 LAB — GLUCOSE, CAPILLARY
Glucose-Capillary: 115 mg/dL — ABNORMAL HIGH (ref 70–99)
Glucose-Capillary: 121 mg/dL — ABNORMAL HIGH (ref 70–99)
Glucose-Capillary: 125 mg/dL — ABNORMAL HIGH (ref 70–99)
Glucose-Capillary: 147 mg/dL — ABNORMAL HIGH (ref 70–99)

## 2022-05-16 LAB — COOXEMETRY PANEL
Carboxyhemoglobin: 2.2 % — ABNORMAL HIGH (ref 0.5–1.5)
Methemoglobin: <0.7 % (ref 0.0–1.5)
O2 Saturation: 53.4 %
Total hemoglobin: 7.2 g/dL — ABNORMAL LOW (ref 12.0–16.0)

## 2022-05-16 LAB — URIC ACID: Uric Acid, Serum: 3.4 mg/dL (ref 2.5–7.1)

## 2022-05-16 LAB — MAGNESIUM: Magnesium: 1.9 mg/dL (ref 1.7–2.4)

## 2022-05-16 MED ORDER — MAGNESIUM SULFATE 2 GM/50ML IV SOLN
2.0000 g | Freq: Once | INTRAVENOUS | Status: AC
  Administered 2022-05-16: 2 g via INTRAVENOUS
  Filled 2022-05-16: qty 50

## 2022-05-16 MED ORDER — POTASSIUM CHLORIDE CRYS ER 20 MEQ PO TBCR
20.0000 meq | EXTENDED_RELEASE_TABLET | Freq: Every day | ORAL | Status: DC
  Administered 2022-05-16 – 2022-05-18 (×3): 20 meq via ORAL
  Filled 2022-05-16 (×3): qty 1

## 2022-05-16 MED ORDER — DAPAGLIFLOZIN PROPANEDIOL 10 MG PO TABS
10.0000 mg | ORAL_TABLET | Freq: Every day | ORAL | Status: DC
  Administered 2022-05-16 – 2022-05-20 (×5): 10 mg via ORAL
  Filled 2022-05-16 (×6): qty 1

## 2022-05-16 MED ORDER — FUROSEMIDE 40 MG PO TABS
40.0000 mg | ORAL_TABLET | Freq: Every day | ORAL | Status: DC
  Administered 2022-05-16 – 2022-05-20 (×5): 40 mg via ORAL
  Filled 2022-05-16 (×6): qty 1

## 2022-05-16 MED ORDER — COLCHICINE 0.6 MG PO TABS
0.6000 mg | ORAL_TABLET | Freq: Two times a day (BID) | ORAL | Status: DC
  Administered 2022-05-16 (×2): 0.6 mg via ORAL
  Filled 2022-05-16 (×2): qty 1

## 2022-05-16 MED FILL — Lidocaine HCl Local Preservative Free (PF) Inj 2%: INTRAMUSCULAR | Qty: 14 | Status: AC

## 2022-05-16 MED FILL — Potassium Chloride Inj 2 mEq/ML: INTRAVENOUS | Qty: 40 | Status: AC

## 2022-05-16 MED FILL — Heparin Sodium (Porcine) Inj 1000 Unit/ML: Qty: 1000 | Status: AC

## 2022-05-16 MED FILL — Magnesium Sulfate Inj 50%: INTRAMUSCULAR | Qty: 10 | Status: AC

## 2022-05-16 NOTE — Progress Notes (Signed)
Patient ID: Shannon [name], female   DOB: Mar 30, 1950, 72 y.o.   MRN: [id number] TCTS Evening Rounds:  Hemodynamically stable  CVP 7  Sats 98%  Worked with PT today.

## 2022-05-16 NOTE — Progress Notes (Signed)
[address] BeachSuite [address]       [place] [address]             [phone]      9 Days Post-Op  Procedure(s) (LRB): AORTIC VALVE REPLACEMENT (AVR) USING INSPIRIS VALVE SIZE 21MM (N/A) MITRAL VALVE (MV) REPLACEMENT USING MOSAIC 310 CINCH SIZE 31MM (N/A) TRANSESOPHAGEAL ECHOCARDIOGRAM (N/A)   Total Length of Stay:  LOS: 9 days    SUBJECTIVE: Feels well Ambulating well but feels left ankle sore. Had it happen in the past   Vitals:   05/16/22 0500 05/16/22 0600  BP: 101/68 101/64  Pulse: 89 90  Resp: (!) 32 18  Temp:    SpO2: 93% 94%    Intake/Output      03/27 0701 03/28 0700 03/28 0701 03/29 0700   P.O. 557    I.V. (mL/kg) 46.3 (0.7)    IV Piggyback 50    Total Intake(mL/kg) 653.3 (10)    Urine (mL/kg/hr) 700 (0.4)    Stool 0    Total Output 700    Net -46.8         Urine Occurrence 3 x    Stool Occurrence 3 x        sodium chloride     sodium chloride 10 mL/hr at 05/10/22 1919   epinephrine Stopped (05/15/22 1808)   lactated ringers 20 mL/hr at 05/10/22 0925    CBC    Component Value Date/Time   WBC 9.6 05/14/2022 0328   RBC 2.66 (L) 05/14/2022 0328   HGB 7.9 (L) 05/14/2022 0328   HGB 12.3 02/26/2022 1546   HCT 23.6 (L) 05/14/2022 0328   HCT 36.8 02/26/2022 1546   PLT 168 05/14/2022 0328   PLT 277 02/26/2022 1546   MCV 88.7 05/14/2022 0328   MCV 93 02/26/2022 1546   MCH 29.7 05/14/2022 0328   MCHC 33.5 05/14/2022 0328   RDW 15.8 (H) 05/14/2022 0328   RDW 14.0 02/26/2022 1546   LYMPHSABS 1.5 05/12/2022 0402   MONOABS 1.4 (H) 05/12/2022 0402   EOSABS 0.4 05/12/2022 0402   BASOSABS 0.0 05/12/2022 0402   CMP     Component Value Date/Time   NA 133 (L) 05/16/2022 0425   NA 143 02/26/2022 1546   K 3.9 05/16/2022 0425   CL 97 (L) 05/16/2022 0425   CO2 27 05/16/2022 0425   GLUCOSE 108 (H) 05/16/2022 0425   BUN 8 05/16/2022 0425   BUN 12 02/26/2022 1546   CREATININE 0.94 05/16/2022 0425   CALCIUM 8.4 (L) 05/16/2022 0425   PROT  6.7 05/03/2022 1100   PROT 6.8 02/26/2022 1546   ALBUMIN 1.8 (L) 05/10/2022 0421   ALBUMIN 4.4 02/26/2022 1546   AST 28 05/03/2022 1100   ALT 21 05/03/2022 1100   ALKPHOS 94 05/03/2022 1100   BILITOT 1.6 (H) 05/03/2022 1100   BILITOT 1.6 (H) 02/26/2022 1546   GFRNONAA >60 05/16/2022 0425   GFRAA 66 06/17/2019 1319   ABG    Component Value Date/Time   PHART 7.407 05/08/2022 1522   PCO2ART 35.4 05/08/2022 1522   PO2ART 101 05/08/2022 1522   HCO3 22.1 05/08/2022 1522   TCO2 23 05/08/2022 1522   ACIDBASEDEF 2.0 05/08/2022 1522   O2SAT 53.4 05/16/2022 0424   CBG (last 3)  Recent Labs    05/15/22 1522 05/15/22 2115 05/16/22 0623  GLUCAP 92 147* 115*  EXAM Lungs: clear Card: RR Ext: warm, no edema. Left plantar area sore to pressure Neuro: alert  ASSESSMENT: POD #9 SP MVR/AVR Hemodynamics ok. Has coox 53 stable from yesterday  Will discuss with AHF but overall looks ok Cont diuresis Hopefully transfer to floor tomorrow Foot pain. ? Etiology. Follow for now   Coralie Common, MD 05/16/2022

## 2022-05-16 NOTE — Progress Notes (Addendum)
Advanced Heart Failure Rounding Note  PCP-Cardiologist: Quay Burow, MD   Subjective:    S/p AVR/MVR. Post-op Echo EF 30-35%, RV mod reduced. Stable AV and MV prosthesis   POD # 9  Now off inotropes/pressors. Milrinone and EPi weaned off yesterday. Co-ox 53% today (hgb off co-ox 7.2).   FICK CI 2.75.   BP stable, 0000000- low [id number] systolic. Scr stable, 0.94.   CVP 6. Feels well. Denies dyspnea. No CP. Ambulating around unit w/o much difficulty. Only complaint is new left ankle pain. Has h/o gout, pain is similar.    Objective:   Weight Range: 65.3 kg Body mass index is 23.24 kg/m.   Vital Signs:   Temp:  [97.8 F (36.6 C)-99.8 F (37.7 C)] 97.9 F (36.6 C) (03/28 0400) Pulse Rate:  [87-97] 90 (03/28 0600) Resp:  [16-32] 18 (03/28 0600) BP: (90-118)/(60-95) 101/64 (03/28 0600) SpO2:  [91 %-100 %] 94 % (03/28 0600) Weight:  [65.3 kg] 65.3 kg (03/28 0500) Last BM Date : 05/15/22  Weight change: Filed Weights   05/14/22 0500 05/15/22 0500 05/16/22 0500  Weight: 66.8 kg 65.8 kg 65.3 kg    Intake/Output:   Intake/Output Summary (Last 24 hours) at 05/16/2022 0756 Last data filed at 05/16/2022 0400 Gross per 24 hour  Intake 653.25 ml  Output 700 ml  Net -46.75 ml    Physical Exam    CVP 6  General:  Well appearing. No respiratory difficulty HEENT: normal Neck: supple. JVD 6 cm. Carotids 2+ bilat; no bruits. No lymphadenopathy or thyromegaly appreciated. Cor: PMI nondisplaced. Regular rate & rhythm. No rubs, gallops or murmurs. Sternotomy site ok.  Lungs: clear Abdomen: soft, nontender, nondistended. No hepatosplenomegaly. No bruits or masses. Good bowel sounds. Extremities: no cyanosis, clubbing, rash, edema + RUE PICC  Neuro: alert & oriented x 3, cranial nerves grossly intact. moves all 4 extremities w/o difficulty. Affect pleasant.   Telemetry   ? Junctional rhythm 90s  (Personally reviewed)    Labs    CBC Recent Labs    05/14/22 0328  WBC 9.6   HGB 7.9*  HCT 23.6*  MCV 88.7  PLT [id number]   Basic Metabolic Panel Recent Labs    05/15/22 0519 05/16/22 0425  NA 134* 133*  K 3.7 3.9  CL 94* 97*  CO2 28 27  GLUCOSE 135* 108*  BUN 5* 8  CREATININE 0.85 0.94  CALCIUM 8.6* 8.4*  MG 1.9 1.9   Liver Function Tests No results for input(s): "AST", "ALT", "ALKPHOS", "BILITOT", "PROT", "ALBUMIN" in the last 72 hours. No results for input(s): "LIPASE", "AMYLASE" in the last 72 hours. Cardiac Enzymes No results for input(s): "CKTOTAL", "CKMB", "CKMBINDEX", "TROPONINI" in the last 72 hours.  BNP: BNP (last 3 results) Recent Labs    03/18/22 1540  BNP 58.1    ProBNP (last 3 results) Recent Labs    02/26/22 1546  PROBNP 2,865*   D-Dimer No results for input(s): "DDIMER" in the last 72 hours. Hemoglobin A1C No results for input(s): "HGBA1C" in the last 72 hours. Fasting Lipid Panel No results for input(s): "CHOL", "HDL", "LDLCALC", "TRIG", "CHOLHDL", "LDLDIRECT" in the last 72 hours. Thyroid Function Tests No results for input(s): "TSH", "T4TOTAL", "T3FREE", "THYROIDAB" in the last 72 hours.  Invalid input(s): "FREET3"  Other results:  Imaging   No results found.  Medications:   Scheduled Medications:  sodium chloride   Intravenous Once   amiodarone  400 mg Oral BID   apixaban  5 mg Oral  BID   aspirin EC  81 mg Oral Daily   atorvastatin  80 mg Oral q1800   bisacodyl  10 mg Oral Daily   Or   bisacodyl  10 mg Rectal Daily   busPIRone  10 mg Oral BID   Chlorhexidine Gluconate Cloth  6 each Topical Daily   colchicine  0.6 mg Oral BID   digoxin  0.125 mg Oral Daily   docusate sodium  200 mg Oral Daily   ezetimibe  10 mg Oral Daily   Fe Fum-Vit C-Vit B12-FA  1 capsule Oral QPC breakfast   feeding supplement  1 Container Oral TID BM   FLUoxetine  20 mg Oral Daily   fluticasone  1 spray Each Nare Daily   furosemide  40 mg Oral Daily   insulin aspart  0-24 Units Subcutaneous TID AC & HS   mometasone-formoterol   2 puff Inhalation BID   montelukast  10 mg Oral QHS   pantoprazole  40 mg Oral Daily   potassium chloride  20 mEq Oral Daily   sodium chloride flush  3 mL Intravenous Q12H    Infusions:  sodium chloride     sodium chloride 10 mL/hr at 05/10/22 1919   epinephrine Stopped (05/15/22 1808)   lactated ringers 20 mL/hr at 05/10/22 0925    PRN Medications: albuterol, ondansetron (ZOFRAN) IV, mouth rinse, oxyCODONE, sodium chloride flush, traMADol  Patient Profile   Shannon [name] is a 72 y.o. 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 AHF team consulted for management of post-cardiotomy shock.  Assessment/Plan  1. Valvular heart disease - s/p MVR/AVR 3/19 - post-op TEE: EF was 30-40% and the valves were well seated and the mitral had a mean of 73mmHg and the aortic had a mean of 37mmHg  - post-op TTE Post-op Echo EF 30-35%, RV mod reduced, prosthetic valves ok    Acute on chronic systolic CHF -  Echo in 0000000 EF 40-45%, mild RV dilation, moderate pulmonary HTN, mod-severe MR and moderate-severe TR.  - RHC/LHC (1/24) with elevated left and right heart filling pressures, pulmonary venous hypertension, preserved cardiac output.  NICM. Cause is uncertain, ?valvular disease.  - Post-op Echo EF 30-35%, RV mod reduced. - Now off Milrinone and Epi. Co-ox 53%, Calculated FICK CI ok at 2.75  - Volume status stable, CVP 6  - Continue digoxin 0.125 mg - with BP still being soft, will hold further GDMT. If stable through today, can trial losartan 12.5 qhs   Pulmonary hypertension - Group 2 pulmonary venous hypertension.    Hyponatremia - 133 today  H/o TIA/CVA - On plavix at home, anticoagulation per TCTS   CKD 3a, h/o AKI - stable Scr 0.94 - avoid hypotension, monitor BP off pressors  - daily BMET    7. Thrombocytopenia - improving  8. Hypokalemia/hypomag - K 3.0, Mg 1.9  - supp w/ diuresis   9.  Accelerated junctional rhythm (vs sinus with long 1AVB) - Continue amio to 200 bid - now in NSR/ST. Watch with addition of digoxin  10. Anemia - Hgb 6.6> 7.9 - s/p 1uPRBc 3/25  11. Left Metatarsal Pain  - suspect gout - check uric acid  - start colchicine   Watch in ICU 1 more stable. If remains stable off pressors can transfer out to 4E tomorrow   Length of Stay: [address] PA-C  05/16/2022, 7:56 AM  Advanced Heart Failure  Team Pager [phone] (M-F; 7a - 5p)  Please contact [facility] for night-coverage after hours (5p -7a ) and weekends on [email or web address]

## 2022-05-16 NOTE — Progress Notes (Addendum)
NAME:  Shannon [name], MRN:  [id number], DOB:  02-28-1950, LOS: 9 ADMISSION DATE:  05/07/2022, CONSULTATION DATE: 05/07/2022 REFERRING MD: Lavonna Monarch - TCTS CHIEF COMPLAINT: Status post valve replacement  History of Present Illness:  72 year old woman with past medical history of smoking, hypertension, GERD, and asthma.  She had been having some swallowing issues and was sent to cardiology for clearance prior to GI procedure and was found to have depressed LV function along with severe mitral and tricuspid regurgitation as well as moderate aortic insufficiency.  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  She was evaluated by cardiothoracic surgery who felt she was a good candidate for replacement/repair, and underwent elective aortic and mitral valve replacement on 3/19 under Dr. Tommi Rumps.    Postoperatively she was sent to the cardiac ICU for recovery on mechanical ventilation and PCCM was asked to evaluate for ICU care.  Pertinent Medical History:   Past Medical History:  Diagnosis Date   Anxiety    Arthritis    "right knee" (05/26/2014)   Asthma    CHF (congestive heart failure) (HCC)    chronic diastolic heart failure - sees Dr. Aundra Dubin   Chronic lower back pain    Dysrhythmia    irregular heart beat   GERD (gastroesophageal reflux disease)    HLD (hyperlipidemia)    Hypertension    Pneumonia    Stroke [facility])    TIA - ?2021   TIA (transient ischemic attack) 04/2018   Significant Hospital Events: Including procedures, antibiotic start and stop dates in addition to other pertinent events   3/19 MVR, AVR. 3/20 POD#1 from valve repair. Extubated ~0300. Slowly downtitrating pressors. Neo off, remains on Epi/NE. 3/21 POD#2. Bigeminy early AM with drop in CI to 1.4-1.6; Co-ox decreased to 49 from 60s. DDD Paced with significant improvement in index and Co-ox (61%). Epi weaned with drop in CI and pressures, increased back to 7. Diuresing BID. Echo with EF 30-35%, global hypokinesis,  moderate RV enlargement/dysfunction, s/p MVR. 3/22 POD#3, Pacing, weaning as able given septal dyssynchrony. CI improved 2.2, CVP 7 on Swan. Remains on milrinone 0.25, Epi 6, NE 8.   3/26 on milrinone 0.125, epi 2, nor epi 3  Interim History / Subjective:  Today she complains of right ankle pain, similar in quality and location to previous gout in the past.  Some pain with palpation, no pain with her sock on  Objective:  Blood pressure 101/64, pulse 90, temperature 97.9 F (36.6 C), temperature source Oral, resp. rate 18, height 5\' 6"  (1.676 m), weight 65.3 kg, SpO2 94 %. CVP:  [6 mmHg-9 mmHg] 9 mmHg      Intake/Output Summary (Last 24 hours) at 05/16/2022 0710 Last data filed at 05/16/2022 0400 Gross per 24 hour  Intake 653.25 ml  Output 700 ml  Net -46.75 ml    Filed Weights   05/14/22 0500 05/15/22 0500 05/16/22 0500  Weight: 66.8 kg 65.8 kg 65.3 kg   Physical Examination: General: Chronically ill-appearing woman sitting up in the chair no acute distress HEENT: [place]/AT, eyes anicteric anicteric Neuro: Awake, alert, moving all extremities.  Answering questions appropriately.  Ambulates with assistance. CV: S1-S2, regular rate and rhythm PULM: Breathing comfortably on room air, CTAB GI: Soft, nontender, nondistended Extremities: No clubbing, cyanosis.  Around her right ankle laterally, mild tenderness to palpation, no warmth or rash. Skin: Warm, dry.  Sternal incision healing well.  Coox 53% Na+  133 Potassium 3.9 BUN 8 Cr 0.94 WBC 11.4 H/H  9.2/28.1 Platelets 363  Resolved problems list:  Hypokalemia  Assessment & Plan:   Severe mitral regurgitation, moderate aortic regurgitation- S/p open MVR, AVR on 3/19  Acute on chronic HFrEF WHO group 2 PH -Weaned off on all inotropes; continue monitoring.  Coox in the morning - Telemetry monitoring - Continue out of bed mobility - Aspirin, statin, digoxin, Farxiga - Continue apixaban, amiodarone - Hopefully can resume  spironolactone soon. - Continue holding PTA Coreg and losartan due to low normal blood pressures  Afib, paroxysmal - Amiodarone and digoxin for rate control, apixaban for anticoagulation - Telemetry monitoring - Monitor electrolytes and replete as needed  R ankle pain, edema -colchicine for presumed gout  Hx asthma, emphysema -Dulera twice daily - Continue pulmonary hygiene and out of bed mobility  ?TIA History - Eliquis for A-fib prophylaxis  CKD at risk AKI post op, improved - strict I/O - Renally dose meds, avoid nephrotoxic medicines  HLD - Statin, Zetia  Hyperglycemia, controlled - SSI as needed - Goal blood glucose less than 180  Acute anemia, ABLA expected post-op Consumptive thrombocytopenia, expected post-op - Monitor periodically, no indication for transfusion  Hyponatremia, possibly due to diuresis -monitor  Deconditioning - PT, OT, OOB mobility -Does a great job spending the day in the chair  [employer] (right click and "Reselect all SmartList Selections" daily)   Diet/type: Regular consistency (see orders) DVT prophylaxis: DOAC GI prophylaxis: PPI--PTA med Lines: Central line- PICC Foley:  N/A Code Status:  full code Last date of multidisciplinary goals of care discussion [ ]   Critical care time:       Julian Hy, DO 05/16/22 3:46 PM [facility]  For contact information, see Amion. If no response to pager, please call PCCM consult pager. After hours, 7PM- 7AM, please call Elink.

## 2022-05-16 NOTE — Evaluation (Signed)
Physical Therapy Evaluation Patient Details Name: Shannon [name] MRN: [id number] DOB: 10/10/1950 Today's Date: 05/16/2022  History of Present Illness  72 y.o. female admitted 3/19 for AVR/MVR. PMH: chronic systolic heart failure due to NICM, severe MR/AI, HTN, HLD, h/o TIA, GERD and asthma  Clinical Impression  PTA pt living with son and his family, in their single story home with 4 steps to enter. Pt reports independence with ADLs and iADLs, ambulating community distances without AD. Pt limited in safe mobility today by increased L foot pain, thought to be gout flare, and associated decreased endurance. Pt is maxA for standing from chair to EVA walker, and ambulates in hallway with modA progressing to min A with distance. RN present at end of session, reporting colchicine arrived to help with gout inflammation. Pt will continue to require PT acutely and with return home.      Recommendations for follow up therapy are one component of a multi-disciplinary discharge planning process, led by the attending physician.  Recommendations may be updated based on patient status, additional functional criteria and insurance authorization.     Assistance Recommended at Discharge Frequent or constant Supervision/Assistance  Patient can return home with the following  A little help with walking and/or transfers;A little help with bathing/dressing/bathroom;Assistance with cooking/housework;Assist for transportation;Help with stairs or ramp for entrance    Equipment Recommendations None recommended by PT  Recommendations for Other Services  OT consult    Functional Status Assessment Patient has had a recent decline in their functional status and demonstrates the ability to make significant improvements in function in a reasonable and predictable amount of time.     Precautions / Restrictions Precautions Precautions: Sternal      Mobility  Bed Mobility               General bed mobility  comments: sitting up in recliner on entry    Transfers Overall transfer level: Needs assistance Equipment used:  (eva walker) Transfers: Sit to/from Stand Sit to Stand: Max assist           General transfer comment: maxA required due to increased pain with L foot weightbearing    Ambulation/Gait Ambulation/Gait assistance: Mod assist, Min assist Gait Distance (Feet): 100 Feet Assistive device: Ethelene Hal Gait Pattern/deviations: Step-through pattern, Decreased step length - right, Decreased step length - left, Narrow base of support, Trunk flexed, Antalgic Gait velocity: slowed Gait velocity interpretation: <1.8 ft/sec, indicate of risk for recurrent falls   General Gait Details: modA progressing to minA for steadying secondary to increased pain in L LE weightbearing, pt agreeable to ambulation even with increased pain      Balance Overall balance assessment: Needs assistance Sitting-balance support: Feet supported, No upper extremity supported Sitting balance-Leahy Scale: Good     Standing balance support: Bilateral upper extremity supported, During functional activity, Reliant on assistive device for balance Standing balance-Leahy Scale: Poor                               Pertinent Vitals/Pain Pain Assessment Pain Assessment: 0-10 Pain Score: 9  Pain Location: R foot Pain Descriptors / Indicators: Sharp, Aching Pain Intervention(s): Limited activity within patient's tolerance, Monitored during session, Repositioned    Home Living Family/patient expects to be discharged to:: Private residence Living Arrangements: Children Available Help at Discharge: Available PRN/intermittently;Family Type of Home: House Home Access: Stairs to enter Entrance Stairs-Rails: None Entrance Stairs-Number of Steps: 2  Home Layout: One level Home Equipment: Conservation officer, nature (2 wheels);Shower seat;Grab bars - tub/shower      Prior Function Prior Level of Function :  Independent/Modified Independent             Mobility Comments: no AD ADLs Comments: indep     Hand Dominance   Dominant Hand: Right    Extremity/Trunk Assessment   Upper Extremity Assessment Upper Extremity Assessment: Overall WFL for tasks assessed    Lower Extremity Assessment Lower Extremity Assessment: LLE deficits/detail;Generalized weakness LLE Deficits / Details: L foot pain limiting weightbearing thought to be due to gout flare    Cervical / Trunk Assessment Cervical / Trunk Assessment: Kyphotic  Communication   Communication: No difficulties  Cognition Arousal/Alertness: Awake/alert Behavior During Therapy: WFL for tasks assessed/performed Overall Cognitive Status: Within Functional Limits for tasks assessed                                          General Comments General comments (skin integrity, edema, etc.): HR in 120s with ambulation on RA        Assessment/Plan    PT Assessment Patient needs continued PT services  PT Problem List Decreased activity tolerance;Decreased balance;Decreased mobility;Cardiopulmonary status limiting activity;Pain       PT Treatment Interventions DME instruction;Gait training;Stair training;Functional mobility training;Therapeutic activities;Therapeutic exercise;Balance training;Patient/family education    PT Goals (Current goals can be found in the Care Plan section)  Acute Rehab PT Goals Patient Stated Goal: be able to plant her flowers PT Goal Formulation: With patient Time For Goal Achievement: 05/30/22 Potential to Achieve Goals: Good    Frequency Min 1X/week        AM-PAC PT "6 Clicks" Mobility  Outcome Measure Help needed turning from your back to your side while in a flat bed without using bedrails?: None Help needed moving from lying on your back to sitting on the side of a flat bed without using bedrails?: None Help needed moving to and from a bed to a chair (including a  wheelchair)?: A Lot Help needed standing up from a chair using your arms (e.g., wheelchair or bedside chair)?: A Lot Help needed to walk in hospital room?: A Lot Help needed climbing 3-5 steps with a railing? : Total 6 Click Score: 15    End of Session Equipment Utilized During Treatment: Gait belt Activity Tolerance: Patient limited by pain Patient left: in chair;with call bell/phone within reach Nurse Communication: Mobility status PT Visit Diagnosis: Other abnormalities of gait and mobility (R26.89);Muscle weakness (generalized) (M62.81);Difficulty in walking, not elsewhere classified (R26.2);Pain Pain - Right/Left: Left Pain - part of body: Ankle and joints of foot    Time: 1006-1040 PT Time Calculation (min) (ACUTE ONLY): 34 min   Charges:   PT Evaluation $PT Eval Moderate Complexity: 1 Mod PT Treatments $Gait Training: 8-22 mins        Nadyne Gariepy B. Migdalia Dk PT, DPT Acute Rehabilitation Services Please use secure chat or  Call Office [phone]   [place] 05/16/2022, 12:30 PM

## 2022-05-17 DIAGNOSIS — Z952 Presence of prosthetic heart valve: Secondary | ICD-10-CM | POA: Diagnosis not present

## 2022-05-17 LAB — GLUCOSE, CAPILLARY
Glucose-Capillary: 104 mg/dL — ABNORMAL HIGH (ref 70–99)
Glucose-Capillary: 110 mg/dL — ABNORMAL HIGH (ref 70–99)
Glucose-Capillary: 112 mg/dL — ABNORMAL HIGH (ref 70–99)
Glucose-Capillary: 123 mg/dL — ABNORMAL HIGH (ref 70–99)
Glucose-Capillary: 94 mg/dL (ref 70–99)

## 2022-05-17 LAB — CBC
HCT: 26.4 % — ABNORMAL LOW (ref 36.0–46.0)
Hemoglobin: 8.4 g/dL — ABNORMAL LOW (ref 12.0–15.0)
MCH: 29.4 pg (ref 26.0–34.0)
MCHC: 31.8 g/dL (ref 30.0–36.0)
MCV: 92.3 fL (ref 80.0–100.0)
Platelets: 387 10*3/uL (ref 150–400)
RBC: 2.86 MIL/uL — ABNORMAL LOW (ref 3.87–5.11)
RDW: 15.8 % — ABNORMAL HIGH (ref 11.5–15.5)
WBC: 10.5 10*3/uL (ref 4.0–10.5)
nRBC: 0 % (ref 0.0–0.2)

## 2022-05-17 LAB — BASIC METABOLIC PANEL
Anion gap: 12 (ref 5–15)
BUN: 9 mg/dL (ref 8–23)
CO2: 25 mmol/L (ref 22–32)
Calcium: 8.3 mg/dL — ABNORMAL LOW (ref 8.9–10.3)
Chloride: 98 mmol/L (ref 98–111)
Creatinine, Ser: 0.89 mg/dL (ref 0.44–1.00)
GFR, Estimated: >60 mL/min (ref >60)
Glucose, Bld: 104 mg/dL — ABNORMAL HIGH (ref 70–99)
Potassium: 3.1 mmol/L — ABNORMAL LOW (ref 3.5–5.1)
Sodium: 135 mmol/L (ref 135–145)

## 2022-05-17 LAB — COOXEMETRY PANEL
Carboxyhemoglobin: 2.1 % — ABNORMAL HIGH (ref 0.5–1.5)
Methemoglobin: <0.7 % (ref 0.0–1.5)
O2 Saturation: 62.6 %
Total hemoglobin: 7.3 g/dL — ABNORMAL LOW (ref 12.0–16.0)

## 2022-05-17 LAB — MAGNESIUM: Magnesium: 1.9 mg/dL (ref 1.7–2.4)

## 2022-05-17 MED ORDER — MAGNESIUM SULFATE 2 GM/50ML IV SOLN
2.0000 g | Freq: Once | INTRAVENOUS | Status: AC
  Administered 2022-05-17: 2 g via INTRAVENOUS
  Filled 2022-05-17: qty 50

## 2022-05-17 MED ORDER — SODIUM CHLORIDE 0.9% FLUSH
3.0000 mL | Freq: Two times a day (BID) | INTRAVENOUS | Status: DC
  Administered 2022-05-17 – 2022-05-20 (×7): 3 mL via INTRAVENOUS

## 2022-05-17 MED ORDER — COLCHICINE 0.6 MG PO TABS
0.6000 mg | ORAL_TABLET | Freq: Every day | ORAL | Status: DC
  Administered 2022-05-17 – 2022-05-20 (×4): 0.6 mg via ORAL
  Filled 2022-05-17 (×5): qty 1

## 2022-05-17 MED ORDER — SPIRONOLACTONE 12.5 MG HALF TABLET
12.5000 mg | ORAL_TABLET | Freq: Every day | ORAL | Status: DC
  Administered 2022-05-17 – 2022-05-19 (×3): 12.5 mg via ORAL
  Filled 2022-05-17 (×3): qty 1

## 2022-05-17 MED ORDER — POTASSIUM CHLORIDE CRYS ER 20 MEQ PO TBCR
40.0000 meq | EXTENDED_RELEASE_TABLET | Freq: Once | ORAL | Status: AC
  Administered 2022-05-17: 40 meq via ORAL
  Filled 2022-05-17: qty 2

## 2022-05-17 MED ORDER — SODIUM CHLORIDE 0.9% FLUSH: 3.0000 mL | INTRAVENOUS | Status: DC | PRN

## 2022-05-17 MED ORDER — ~~LOC~~ CARDIAC SURGERY, PATIENT & FAMILY EDUCATION: Freq: Once | Status: AC

## 2022-05-17 MED ORDER — SPIRONOLACTONE 12.5 MG HALF TABLET: 12.5000 mg | ORAL_TABLET | Freq: Every day | ORAL | Status: DC

## 2022-05-17 MED ORDER — SODIUM CHLORIDE 0.9 % IV SOLN: 250.0000 mL | INTRAVENOUS | Status: DC | PRN

## 2022-05-17 NOTE — Progress Notes (Signed)
See PA note for full exam and details  TCTS Progress Note: 10 Days Post-Op Procedure(s) (LRB): AORTIC VALVE REPLACEMENT (AVR) USING INSPIRIS VALVE SIZE 21MM (N/A) MITRAL VALVE (MV) REPLACEMENT USING MOSAIC 310 CINCH SIZE 31MM (N/A) TRANSESOPHAGEAL ECHOCARDIOGRAM (N/A)  LOS: 10 days   Multidiscp rounds with crit care  Overall looks well Stable overnight no issues Heart failure following as well  Plan floor transfer today Is on therapeutic AC after valve AVR/MVR bio     Latest Ref Rng & Units 05/17/2022    5:03 AM 05/16/2022   10:59 AM 05/14/2022    3:28 AM  CBC  WBC 4.0 - 10.5 K/uL 10.5  11.4  9.6   Hemoglobin 12.0 - 15.0 g/dL 8.4  9.2  7.9   Hematocrit 36.0 - 46.0 % 26.4  28.1  23.6   Platelets 150 - 400 K/uL 387  363  168        Latest Ref Rng & Units 05/17/2022    5:03 AM 05/16/2022    4:25 AM 05/15/2022    5:19 AM  CMP  Glucose 70 - 99 mg/dL 104  108  135   BUN 8 - 23 mg/dL 9  8  5    Creatinine 0.44 - 1.00 mg/dL 0.89  0.94  0.85   Sodium 135 - 145 mmol/L 135  133  134   Potassium 3.5 - 5.1 mmol/L 3.1  3.9  3.7   Chloride 98 - 111 mmol/L 98  97  94   CO2 22 - 32 mmol/L 25  27  28    Calcium 8.9 - 10.3 mg/dL 8.3  8.4  8.6     ABG    Component Value Date/Time   PHART 7.407 05/08/2022 1522   PCO2ART 35.4 05/08/2022 1522   PO2ART 101 05/08/2022 1522   HCO3 22.1 05/08/2022 1522   TCO2 23 05/08/2022 1522   ACIDBASEDEF 2.0 05/08/2022 1522   O2SAT 62.6 05/17/2022 0503

## 2022-05-17 NOTE — Progress Notes (Signed)
NAME:  Shannon [name], MRN:  [id number], DOB:  Nov 26, 1950, LOS: 25 ADMISSION DATE:  05/07/2022, CONSULTATION DATE: 05/07/2022 REFERRING MD: Lavonna Monarch - TCTS CHIEF COMPLAINT: Status post valve replacement  History of Present Illness:  72 year old woman with past medical history of smoking, hypertension, GERD, and asthma.  She had been having some swallowing issues and was sent to cardiology for clearance prior to GI procedure and was found to have depressed LV function along with severe mitral and tricuspid regurgitation as well as moderate aortic insufficiency.  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  She was evaluated by cardiothoracic surgery who felt she was a good candidate for replacement/repair, and underwent elective aortic and mitral valve replacement on 3/19 under Dr. Tommi Rumps.    Postoperatively she was sent to the cardiac ICU for recovery on mechanical ventilation and PCCM was asked to evaluate for ICU care.  Pertinent Medical History:   Past Medical History:  Diagnosis Date   Anxiety    Arthritis    "right knee" (05/26/2014)   Asthma    CHF (congestive heart failure) (HCC)    chronic diastolic heart failure - sees Dr. Aundra Dubin   Chronic lower back pain    Dysrhythmia    irregular heart beat   GERD (gastroesophageal reflux disease)    HLD (hyperlipidemia)    Hypertension    Pneumonia    Stroke [facility])    TIA - ?2021   TIA (transient ischemic attack) 04/2018   Significant Hospital Events: Including procedures, antibiotic start and stop dates in addition to other pertinent events   3/19 MVR, AVR. 3/20 POD#1 from valve repair. Extubated ~0300. Slowly downtitrating pressors. Neo off, remains on Epi/NE. 3/21 POD#2. Bigeminy early AM with drop in CI to 1.4-1.6; Co-ox decreased to 49 from 60s. DDD Paced with significant improvement in index and Co-ox (61%). Epi weaned with drop in CI and pressures, increased back to 7. Diuresing BID. Echo with EF 30-35%, global hypokinesis,  moderate RV enlargement/dysfunction, s/p MVR. 3/22 POD#3, Pacing, weaning as able given septal dyssynchrony. CI improved 2.2, CVP 7 on Swan. Remains on milrinone 0.25, Epi 6, NE 8.   3/26 on milrinone 0.125, epi 2, nor epi 3  Interim History / Subjective:  R ankle pain better Pulling 1250 on IS  Objective:  Blood pressure 119/81, pulse 87, temperature 98.5 F (36.9 C), temperature source Oral, resp. rate (!) 28, height 5\' 6"  (1.676 m), weight 65.3 kg, SpO2 93 %. 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  Coox in the morning - Telemetry monitoring - Continue out of bed mobility as tolerated, walked unit yesterday - Aspirin, statin, digoxin, Farxiga - Continue apixaban, amiodarone - ? spiro - Continue holding PTA Coreg and losartan due to low normal blood pressures  Afib, paroxysmal - Amiodarone and digoxin for rate control, apixaban for anticoagulation - Telemetry monitoring - Monitor electrolytes and replete as needed  R ankle pain, edema -colchicine for presumed gout, continue, improved  Hx asthma, emphysema -Dulera twice daily - Continue pulmonary hygiene and out of bed mobility  ?TIA History - Eliquis for A-fib prophylaxis  CKD at risk AKI post op, improved - strict I/O - Renally dose meds, avoid nephrotoxic  medicines  HLD - Statin, Zetia  Hyperglycemia, controlled - SSI as needed - Goal blood glucose less than 180  Acute anemia, ABLA expected post-op Consumptive thrombocytopenia, expected post-op - Seems better, usual transfusion thresholds   Deconditioning - PT, OT, OOB mobility -Does a great job spending the day in the chair  [employer] (right click and "Reselect all SmartList Selections" daily)   Diet/type: Regular consistency (see orders) DVT prophylaxis: DOAC GI prophylaxis: PPI--PTA med Lines: Central line- PICC Foley:  N/A Code Status:  full code Last date of multidisciplinary goals of care discussion [pending]  Erskine Emery MD PCCM

## 2022-05-17 NOTE — Progress Notes (Signed)
Advanced Heart Failure Rounding Note  PCP-Cardiologist: Quay Burow, MD   Subjective:    S/p AVR/MVR. Post-op Echo EF 30-35%, RV mod reduced. Stable AV and MV prosthesis   POD # 10   Stable off pressors. SBPs low 100s. No orthostatic symptoms. Co-ox 63%   Feels well. No dyspnea. No chest pain. Ankle pain improved.     Objective:   Weight Range: 65.3 kg Body mass index is 23.24 kg/m.   Vital Signs:   Temp:  [97.5 F (36.4 C)-98.5 F (36.9 C)] 97.5 F (36.4 C) (03/29 0800) Pulse Rate:  [80-98] 80 (03/29 0800) Resp:  [17-30] 18 (03/29 0800) BP: (76-119)/(53-90) 107/57 (03/29 0800) SpO2:  [88 %-99 %] 99 % (03/29 0800) Weight:  [65.1 kg-65.3 kg] 65.3 kg (03/29 0600) Last BM Date : 05/16/22  Weight change: Filed Weights   05/16/22 0500 05/17/22 0500 05/17/22 0600  Weight: 65.3 kg 65.1 kg 65.3 kg    Intake/Output:   Intake/Output Summary (Last 24 hours) at 05/17/2022 0829 Last data filed at 05/17/2022 0400 Gross per 24 hour  Intake 498.66 ml  Output 329 ml  Net 169.66 ml    Physical Exam    General:  Well appearing. No respiratory difficulty HEENT: normal Neck: supple. JVD not elevated. Carotids 2+ bilat; no bruits. No lymphadenopathy or thyromegaly appreciated. Cor: PMI nondisplaced. Regular rate & rhythm. No rubs, gallops or murmurs. Sternotomy site stable.  Lungs: clear Abdomen: soft, nontender, nondistended. No hepatosplenomegaly. No bruits or masses. Good bowel sounds. Extremities: no cyanosis, clubbing, rash, edema Neuro: alert & oriented x 3, cranial nerves grossly intact. moves all 4 extremities w/o difficulty. Affect pleasant.    Telemetry   ? Junctional rhythm 90s  (Personally reviewed)    Labs    CBC Recent Labs    05/16/22 1059 05/17/22 0503  WBC 11.4* 10.5  HGB 9.2* 8.4*  HCT 28.1* 26.4*  MCV 90.9 92.3  PLT 363 [id number]   Basic Metabolic Panel Recent Labs    05/16/22 0425 05/17/22 0503  NA 133* 135  K 3.9 3.1*  CL 97* 98   CO2 27 25  GLUCOSE 108* 104*  BUN 8 9  CREATININE 0.94 0.89  CALCIUM 8.4* 8.3*  MG 1.9 1.9   Liver Function Tests No results for input(s): "AST", "ALT", "ALKPHOS", "BILITOT", "PROT", "ALBUMIN" in the last 72 hours. No results for input(s): "LIPASE", "AMYLASE" in the last 72 hours. Cardiac Enzymes No results for input(s): "CKTOTAL", "CKMB", "CKMBINDEX", "TROPONINI" in the last 72 hours.  BNP: BNP (last 3 results) Recent Labs    03/18/22 1540  BNP 58.1    ProBNP (last 3 results) Recent Labs    02/26/22 1546  PROBNP 2,865*   D-Dimer No results for input(s): "DDIMER" in the last 72 hours. Hemoglobin A1C No results for input(s): "HGBA1C" in the last 72 hours. Fasting Lipid Panel No results for input(s): "CHOL", "HDL", "LDLCALC", "TRIG", "CHOLHDL", "LDLDIRECT" in the last 72 hours. Thyroid Function Tests No results for input(s): "TSH", "T4TOTAL", "T3FREE", "THYROIDAB" in the last 72 hours.  Invalid input(s): "FREET3"  Other results:  Imaging   No results found.  Medications:   Scheduled Medications:  sodium chloride   Intravenous Once   amiodarone  400 mg Oral BID   apixaban  5 mg Oral BID   aspirin EC  81 mg Oral Daily   atorvastatin  80 mg Oral q1800   bisacodyl  10 mg Oral Daily   Or   bisacodyl  10  mg Rectal Daily   busPIRone  10 mg Oral BID   Chlorhexidine Gluconate Cloth  6 each Topical Daily   colchicine  0.6 mg Oral BID   dapagliflozin propanediol  10 mg Oral Daily   digoxin  0.125 mg Oral Daily   docusate sodium  200 mg Oral Daily   ezetimibe  10 mg Oral Daily   Fe Fum-Vit C-Vit B12-FA  1 capsule Oral QPC breakfast   feeding supplement  1 Container Oral TID BM   FLUoxetine  20 mg Oral Daily   fluticasone  1 spray Each Nare Daily   furosemide  40 mg Oral Daily   insulin aspart  0-24 Units Subcutaneous TID AC & HS   mometasone-formoterol  2 puff Inhalation BID   montelukast  10 mg Oral QHS   pantoprazole  40 mg Oral Daily   potassium chloride   20 mEq Oral Daily   potassium chloride  40 mEq Oral Once   sodium chloride flush  3 mL Intravenous Q12H   spironolactone  12.5 mg Oral QHS    Infusions:  sodium chloride     sodium chloride 10 mL/hr at 05/10/22 1919   lactated ringers 20 mL/hr at 05/10/22 [id number]   magnesium sulfate bolus IVPB      PRN Medications: albuterol, ondansetron (ZOFRAN) IV, mouth rinse, oxyCODONE, sodium chloride flush, traMADol  Patient Profile   [name] is a 72 y.o. 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 AHF team consulted for management of post-cardiotomy shock.  Assessment/Plan  1. Valvular heart disease - s/p MVR/AVR 3/19 - post-op TEE: EF was 30-40% and the valves were well seated and the mitral had a mean of 69mmHg and the aortic had a mean of 86mmHg  - post-op TTE Post-op Echo EF 30-35%, RV mod reduced, prosthetic valves ok    Acute on chronic systolic CHF -  Echo in 0000000 EF 40-45%, mild RV dilation, moderate pulmonary HTN, mod-severe MR and moderate-severe TR.  - RHC/LHC (1/24) with elevated left and right heart filling pressures, pulmonary venous hypertension, preserved cardiac output.  NICM. Cause is uncertain, ?valvular disease.  - Post-op Echo EF 30-35%, RV mod reduced. - Stable off inotropes/pressors, Co-ox 63%, euvolemic  - Continue farxiga 10  - Continue digoxin 0.125 mg - Add spiro 12.5 qhs  - Continue 40 mg PO Lasix   Pulmonary hypertension - Group 2 pulmonary venous hypertension.    Hyponatremia - 135 today  H/o TIA/CVA - On plavix at home, anticoagulation per TCTS   CKD 3a, h/o AKI - stable Scr 0.89 - daily BMET    7. Thrombocytopenia - resolved   8. Hypokalemia/hypomag - K 3.1, Mg 1.9  - supp K and Mg - add spiro   9. Accelerated junctional rhythm (vs sinus with long 1AVB) - Continue amio to 200 bid - now in NSR/ST. Watch with addition of digoxin  10. Anemia - s/p 1uPRBc  3/25 - hgb now stable, 8.4   11. Left Metatarsal Pain  - treating empirically for gout - pain improved w/ colchicine    transfer out to 4E today   Length of Stay: [address] PA-C  05/17/2022, 8:29 AM  Advanced Heart Failure Team Pager [phone] (M-F; 7a - 5p)  Please contact [facility] for night-coverage after hours (5p -7a ) and weekends on [email or web address]

## 2022-05-17 NOTE — Progress Notes (Signed)
1015 Attempt to give report to 4E nurse; RN not available due to admission. Will try again in 39min.

## 2022-05-17 NOTE — TOC Initial Note (Signed)
Transition of Care [facility]) - Initial/Assessment Note    Patient Details  Name: Shannon [name] MRN: [id number] Date of Birth: [birth year]  [facility]) CM/SW Contact:    Erenest Rasher, RN Phone Number: [phone] 05/17/2022, 10:24 AM  Clinical Narrative:                 CM spoke to pt and lives with son. Her sister will provide transportation home. Pt preoperatively set up with [facility] for [facility]. Pt has RW, scale, and shower chair at home. Pt declined BSC.   Expected Discharge Plan: [place] Barriers to Discharge: No Barriers Identified   Patient Goals and CMS Choice Patient states their goals for this hospitalization and ongoing recovery are:: wants to recover CMS Medicare.gov Compare Post Acute Care list provided to:: Patient Choice offered to / list presented to : Patient      Expected Discharge Plan and Services   Discharge Planning Services: CM Consult Post Acute Care Choice: [place] arrangements for the past 2 months: Single Family Home                           HH Arranged: RN, PT [facility] Agency: [place] Date [place]: 05/17/22 Time [place]: 1024 Representative spoke with at [place]: [place] Arrangements/Services Living arrangements for the past 2 months: [place] with:: Adult Children Patient language and need for interpreter reviewed:: Yes Do you feel safe going back to the place where you live?: Yes          Current home services: DME (rolling walker) Criminal Activity/Legal Involvement Pertinent to Current Situation/Hospitalization: No - Comment as needed  Activities of Daily Living Home Assistive Devices/Equipment: Environmental consultant (specify type), Shower chair with back ADL Screening (condition at time of admission) Patient's cognitive ability adequate to safely complete daily activities?: Yes Is the patient deaf or have difficulty hearing?: No Does  the patient have difficulty seeing, even when wearing glasses/contacts?: No Does the patient have difficulty concentrating, remembering, or making decisions?: No Patient able to express need for assistance with ADLs?: Yes Does the patient have difficulty dressing or bathing?: No Independently performs ADLs?: Yes (appropriate for developmental age) Does the patient have difficulty walking or climbing stairs?: No Weakness of Legs: Both Weakness of Arms/Hands: None  Permission Sought/Granted Permission sought to share information with : Case Manager Permission granted to share information with : Yes, Verbal Permission Granted  Share Information with NAME: Annetta North granted to share info w Relationship: sister  Permission granted to share info w Contact Information: [phone]  Emotional Assessment Appearance:: Appears stated age   Affect (typically observed): Accepting Orientation: : Oriented to Self, Oriented to Place, Oriented to  Time, Oriented to Situation   Psych Involvement: No (comment)  Admission diagnosis:  S/P AVR (aortic valve replacement) [Z95.2] Patient Active Problem List   Diagnosis Date Noted   Hyponatremia 05/16/2022   Acute gout of right ankle 05/16/2022   S/P AVR (aortic valve replacement) 05/07/2022   Adenopathy 04/03/2022   Mitral regurgitation 02/28/2022   Acute on chronic combined systolic and diastolic CHF (congestive heart failure) ([place]) 02/28/2022   COPD with acute exacerbation (HCC)    Acute respiratory failure with hypoxia (HCC)    Nodule of upper lobe of left lung    Asthma exacerbation 06/19/2021   Community acquired  pneumonia 06/19/2021   OSA (obstructive sleep apnea) 03/29/2020   Palpitations 08/17/2019   TIA (transient ischemic attack) 05/04/2018   Chest pain 05/26/2014   AKI (acute kidney injury) ([place]) 05/26/2014   Pain in the chest 05/26/2014   Diarrhea 05/26/2014   Alcohol intoxication ([place])    Hypokalemia     G E R D 07/02/2006   Dysphagia 07/02/2006   HYPERCHOLESTEROLEMIA 04/17/2006   HYPERTENSION, BENIGN SYSTEMIC 04/17/2006   Allergic rhinitis 04/17/2006   PCP:  Demetrios Isaacs, FNP Pharmacy:   [id number] - [address], [address] [place] [address] Phone: [phone] Fax: [phone]  [facility] [address] [place] [address] Phone: [phone] Fax: [phone]     Social Determinants of Health (SDOH) Social History: SDOH Screenings   Food Insecurity: No Food Insecurity (05/09/2022)  Housing: Low Risk  (05/09/2022)  Transportation Needs: No Transportation Needs (05/09/2022)  Utilities: Not At Risk (05/09/2022)  Depression (PHQ2-9): Low Risk  (06/15/2018)  Tobacco Use: Medium Risk (05/08/2022)   SDOH Interventions:     Readmission Risk Interventions     No data to display

## 2022-05-17 NOTE — Evaluation (Signed)
Occupational Therapy Evaluation Patient Details Name: Shannon [name] MRN: [id number] DOB: [birth year] Today's Date: 05/17/2022   History of Present Illness 72 y.o. female admitted 3/19 for AVR/MVR. PMH: chronic systolic heart failure due to NICM, severe MR/AI, HTN, HLD, h/o TIA, GERD and asthma   Clinical Impression   Patient admitted for the diagnosis above.  PTA she lives with her son, who works during the day, but has three sisters who will be assisting as needed.  [place] services are being recommended for post acute rehab.  [place] OT will be left up to the patient, as she will have assist as needed from family.  Currently she is needing generalized supervision to [place] guard for ADL completion, primary deficit is increased assist for sit to stand.  OT is indicated in the acute setting to address deficits listed.        Recommendations for follow up therapy are one component of a multi-disciplinary discharge planning process, led by the attending physician.  Recommendations may be updated based on patient status, additional functional criteria and insurance authorization.   Assistance Recommended at Discharge Intermittent Supervision/Assistance  Patient can return home with the following Assist for transportation;Assistance with cooking/housework;A little help with walking and/or transfers;A little help with bathing/dressing/bathroom    Functional Status Assessment  Patient has had a recent decline in their functional status and demonstrates the ability to make significant improvements in function in a reasonable and predictable amount of time.  Equipment Recommendations  None recommended by OT    Recommendations for Other Services       Precautions / Restrictions Precautions Precautions: Sternal Restrictions Weight Bearing Restrictions:  (sternal precautions)      Mobility Bed Mobility               General bed mobility comments: sitting up in recliner on entry     Transfers Overall transfer level: Needs assistance   Transfers: Sit to/from Stand Sit to Stand: Min assist, Mod assist                  Balance Overall balance assessment: Needs assistance Sitting-balance support: Feet supported, No upper extremity supported Sitting balance-Leahy Scale: Good     Standing balance support: Bilateral upper extremity supported Standing balance-Leahy Scale: Fair                             ADL either performed or assessed with clinical judgement   ADL Overall ADL's : At baseline                                       General ADL Comments: generalized supervision from a sit to stand level     Vision Patient Visual Report: No change from baseline       Perception     Praxis      Pertinent Vitals/Pain Pain Assessment Pain Assessment: No/denies pain Pain Intervention(s): Monitored during session     Hand Dominance Right   Extremity/Trunk Assessment Upper Extremity Assessment Upper Extremity Assessment: Overall WFL for tasks assessed   Lower Extremity Assessment Lower Extremity Assessment: Defer to PT evaluation       Communication Communication Communication: No difficulties   Cognition Arousal/Alertness: Awake/alert Behavior During Therapy: WFL for tasks assessed/performed Overall Cognitive Status: Within Functional Limits for tasks assessed  General Comments  VSS on RA    Exercises     Shoulder Instructions      Home Living Family/patient expects to be discharged to:: Private residence Living Arrangements: Children Available Help at Discharge: Available PRN/intermittently;Family Type of Home: House Home Access: Stairs to enter [employer] of Steps: 2 Entrance Stairs-Rails: None Home Layout: One level     Bathroom Shower/Tub: Teacher, early years/pre: Standard Bathroom Accessibility: Yes   Home  Equipment: Conservation officer, nature (2 wheels);Shower seat;Grab bars - tub/shower          Prior Functioning/Environment Prior Level of Function : Independent/Modified Independent             Mobility Comments: no AD ADLs Comments: indep        OT Problem List: Impaired balance (sitting and/or standing);Decreased activity tolerance;Decreased knowledge of precautions      OT Treatment/Interventions: Self-care/ADL training;Therapeutic activities;Patient/family education;Balance training    OT Goals(Current goals can be found in the care plan section) Acute Rehab OT Goals Patient Stated Goal: Return home OT Goal Formulation: With patient Time For Goal Achievement: 05/31/22 Potential to Achieve Goals: Good ADL Goals Pt Will Perform Lower Body Dressing: sit to/from stand;with set-up Pt Will Transfer to Toilet: with set-up;ambulating;regular height toilet  OT Frequency: Min 2X/week    Co-evaluation              AM-PAC OT "6 Clicks" Daily Activity     Outcome Measure Help from another person eating meals?: None Help from another person taking care of personal grooming?: A Little Help from another person toileting, which includes using toliet, bedpan, or urinal?: A Little Help from another person bathing (including washing, rinsing, drying)?: A Little Help from another person to put on and taking off regular upper body clothing?: A Little Help from another person to put on and taking off regular lower body clothing?: A Little 6 Click Score: 19   End of Session Equipment Utilized During Treatment: Rolling walker (2 wheels) Nurse Communication: Mobility status  Activity Tolerance: Patient tolerated treatment well Patient left: in chair;with call bell/phone within reach  OT Visit Diagnosis: Unsteadiness on feet (R26.81)                Time: [id number] OT Time Calculation (min): 17 min Charges:  OT General Charges $OT Visit: 1 Visit OT Evaluation $OT Eval Moderate Complexity:  1 Mod  05/17/2022  RP, OTR/L  Acute Rehabilitation Services  Office:  [phone]   Metta Clines 05/17/2022, 10:33 AM

## 2022-05-17 NOTE — Progress Notes (Signed)
CARDIAC REHAB PHASE I   PRE:  Rate/Rhythm: 79 first deg    BP: sitting 99/59    SpO2: 100 RA  MODE:  Ambulation: 470 ft   POST:  Rate/Rhythm: 103 first deg, brief tachy (? Afib) 120    BP: sitting 116/53     SpO2: 100 RA  Pt in recliner, stood with min assist and ambulated with standby assist and RW. Steady, no c/o, tolerated well. Pt did have brief run of tachy rhythm upon sitting after walk. Sts "she felt it". Encouraged IS and x1 more walk today.  [place], ACSM-CEP 05/17/2022 2:22 PM

## 2022-05-17 NOTE — Progress Notes (Signed)
Physical Therapy Treatment Patient Details Name: Shannon [name] MRN: [id number] DOB: 01/05/1951 Today's Date: 05/17/2022   History of Present Illness 72 y.o. female admitted 3/19 for AVR/MVR. PMH: chronic systolic heart failure due to NICM, severe MR/AI, HTN, HLD, h/o TIA, GERD and asthma    PT Comments    Pt is making good progress towards her goals as her R foot pain has been relieved. Pt with poor LE strength for power up to EVA walker requiring modA for power up and steadying. Once up though pt able to ambulate over 700 feet with min guard. Pt with good static standing prior to return to sitting in recliner. Pt with decreased eccentric control with descent into recliner. D/c plans remain appropriate at this time. PT will continue to follow acutely.   Recommendations for follow up therapy are one component of a multi-disciplinary discharge planning process, led by the attending physician.  Recommendations may be updated based on patient status, additional functional criteria and insurance authorization.     Assistance Recommended at Discharge Frequent or constant Supervision/Assistance  Patient can return home with the following A little help with walking and/or transfers;A little help with bathing/dressing/bathroom;Assistance with cooking/housework;Assist for transportation;Help with stairs or ramp for entrance   Equipment Recommendations  None recommended by PT    Recommendations for Other Services OT consult     Precautions / Restrictions Precautions Precautions: Sternal Restrictions Weight Bearing Restrictions:  (sternal precautions)     Mobility  Bed Mobility               General bed mobility comments: sitting up in recliner on entry    Transfers Overall transfer level: Needs assistance Equipment used:  (eva walker) Transfers: Sit to/from Stand Sit to Stand: Mod assist           General transfer comment: 2 attempts required to come all the way to  standing, use of momentum on second attempt    Ambulation/Gait Ambulation/Gait assistance: Min guard Gait Distance (Feet): 760 Feet Assistive device: Ethelene Hal Gait Pattern/deviations: Step-through pattern, Decreased step length - right, Decreased step length - left, Narrow base of support, Trunk flexed, [place] Gait velocity: slowed Gait velocity interpretation: 1.31 - 2.62 ft/sec, indicative of limited community ambulator   General Gait Details: min guard for safety, steady appropriate gait, good navigation around obstacles in the hallway       Balance Overall balance assessment: Needs assistance Sitting-balance support: Feet supported, No upper extremity supported Sitting balance-Leahy Scale: Good     Standing balance support: Bilateral upper extremity supported, During functional activity, Reliant on assistive device for balance, No upper extremity supported Standing balance-Leahy Scale: Fair Standing balance comment: able to static stand without assist 30 sec prior to sitting                            Cognition Arousal/Alertness: Awake/alert Behavior During Therapy: WFL for tasks assessed/performed Overall Cognitive Status: Within Functional Limits for tasks assessed                                             General Comments General comments (skin integrity, edema, etc.): Max HR noted with ambulation 96 bpm, SpO2 >90%O2 when good waveform present with ambulation      Pertinent Vitals/Pain Pain Assessment Pain Assessment: Faces Faces Pain Scale: Hurts a  little bit Pain Location: R foot Pain Descriptors / Indicators: Discomfort, Grimacing Pain Intervention(s): Monitored during session     PT Goals (current goals can now be found in the care plan section) Acute Rehab PT Goals Patient Stated Goal: be able to plant her flowers PT Goal Formulation: With patient Time For Goal Achievement: 05/30/22 Potential to Achieve Goals:  Good Progress towards PT goals: Progressing toward goals    Frequency    Min 1X/week      PT Plan Current plan remains appropriate       AM-PAC PT "6 Clicks" Mobility   Outcome Measure  Help needed turning from your back to your side while in a flat bed without using bedrails?: None Help needed moving from lying on your back to sitting on the side of a flat bed without using bedrails?: None Help needed moving to and from a bed to a chair (including a wheelchair)?: A Lot Help needed standing up from a chair using your arms (e.g., wheelchair or bedside chair)?: A Lot Help needed to walk in hospital room?: A Lot Help needed climbing 3-5 steps with a railing? : Total 6 Click Score: 15    End of Session Equipment Utilized During Treatment: Gait belt Activity Tolerance: Patient tolerated treatment well Patient left: in chair;with call bell/phone within reach Nurse Communication: Mobility status PT Visit Diagnosis: Other abnormalities of gait and mobility (R26.89);Muscle weakness (generalized) (M62.81);Difficulty in walking, not elsewhere classified (R26.2);Pain Pain - Right/Left: Left Pain - part of body: Ankle and joints of foot     Time: [id number] PT Time Calculation (min) (ACUTE ONLY): 19 min  Charges:  $Therapeutic Exercise: 8-22 mins                     Elania Crowl B. Migdalia Dk PT, DPT Acute Rehabilitation Services Please use secure chat or  Call Office [phone]    [place] 05/17/2022, 10:09 AM

## 2022-05-18 LAB — CBC
HCT: 26.3 % — ABNORMAL LOW (ref 36.0–46.0)
Hemoglobin: 8.6 g/dL — ABNORMAL LOW (ref 12.0–15.0)
MCH: 29.4 pg (ref 26.0–34.0)
MCHC: 32.7 g/dL (ref 30.0–36.0)
MCV: 89.8 fL (ref 80.0–100.0)
Platelets: 442 10*3/uL — ABNORMAL HIGH (ref 150–400)
RBC: 2.93 MIL/uL — ABNORMAL LOW (ref 3.87–5.11)
RDW: 15.7 % — ABNORMAL HIGH (ref 11.5–15.5)
WBC: 10.6 10*3/uL — ABNORMAL HIGH (ref 4.0–10.5)
nRBC: 0 % (ref 0.0–0.2)

## 2022-05-18 LAB — GLUCOSE, CAPILLARY
Glucose-Capillary: 95 mg/dL (ref 70–99)
Glucose-Capillary: 102 mg/dL — ABNORMAL HIGH (ref 70–99)
Glucose-Capillary: 87 mg/dL (ref 70–99)
Glucose-Capillary: 106 mg/dL — ABNORMAL HIGH (ref 70–99)

## 2022-05-18 LAB — BASIC METABOLIC PANEL
Anion gap: 13 (ref 5–15)
BUN: 6 mg/dL — ABNORMAL LOW (ref 8–23)
CO2: 25 mmol/L (ref 22–32)
Calcium: 8.5 mg/dL — ABNORMAL LOW (ref 8.9–10.3)
Chloride: 98 mmol/L (ref 98–111)
Creatinine, Ser: 0.99 mg/dL (ref 0.44–1.00)
GFR, Estimated: >60 mL/min (ref >60)
Glucose, Bld: 95 mg/dL (ref 70–99)
Potassium: 3.3 mmol/L — ABNORMAL LOW (ref 3.5–5.1)
Sodium: 136 mmol/L (ref 135–145)

## 2022-05-18 LAB — COOXEMETRY PANEL
Carboxyhemoglobin: 4.3 % — ABNORMAL HIGH (ref 0.5–1.5)
Methemoglobin: <0.7 % (ref 0.0–1.5)
O2 Saturation: 65 %
Total hemoglobin: 8.8 g/dL — ABNORMAL LOW (ref 12.0–16.0)

## 2022-05-18 LAB — DIGOXIN LEVEL: Digoxin Level: 0.6 ng/mL — ABNORMAL LOW (ref 0.8–2.0)

## 2022-05-18 LAB — MAGNESIUM: Magnesium: 1.9 mg/dL (ref 1.7–2.4)

## 2022-05-18 MED ORDER — POTASSIUM CHLORIDE CRYS ER 20 MEQ PO TBCR
20.0000 meq | EXTENDED_RELEASE_TABLET | Freq: Three times a day (TID) | ORAL | Status: AC
  Administered 2022-05-18 – 2022-05-19 (×3): 20 meq via ORAL
  Filled 2022-05-18 (×3): qty 1

## 2022-05-18 NOTE — Progress Notes (Signed)
CARDIAC REHAB PHASE I   PRE:  Rate/Rhythm: NSR/81  BP:  Sitting: 100/54  Standing: 91/56      SaO2: 99%  MODE:  Ambulation: 470 ft SaO2 = 92%  POST:  Rate/Rhythm: NSR 95  BP:  Sitting: 107/61       SaO2: 94%  RN gives permission to ambulate patient. Pt received in bed and agrees to ambulate. Pt ambulated with steady gait using rolling walker/gait belt. Pt took no breaks and had no c/o SOB, dizziness, or pain. Pt back to chair will brakes on, call bell and tray table in reach.  Provided S/P AVR education. Reviewed the post op book with the patient. Reviewed move-in-the-tube concept with restrictions. Reviewed wound care and s/s to report to MD. Encouraged medication compliance and follow-up with MD(s). Encouraged daily weights and low Na diet, handout provided. Provided information on heart healthy diet. Reviewed activity progression and s/s to terminate activity. Referral for the CRP2 program at [facility]. Pt verbalized understanding of the education provided.   Pt pulled 760mL on the I/S. Encouraged 10x/hr and continued use at home after discharge.   Lesly Rubenstein, MS, ACSM EP-C, [facility] 05/18/2022  7:50 - 8:54

## 2022-05-18 NOTE — Progress Notes (Signed)
[address]       [place] [address]             [phone]      11 Days Post-Op Procedure(s) (LRB): AORTIC VALVE REPLACEMENT (AVR) USING INSPIRIS VALVE SIZE 21MM (N/A) MITRAL VALVE (MV) REPLACEMENT USING MOSAIC 310 CINCH SIZE 31MM (N/A) TRANSESOPHAGEAL ECHOCARDIOGRAM (N/A) Subjective: Up in the bedside chair, says she is comfortable and has no new complaints or concerns.  Walked in the hall 470 feet with the mobility team yesterday and again this morning.  Remains on room air with no shortness of breath and adequate O2 sats.  Objective: Vital signs in last 24 hours: Temp:  [97.7 F (36.5 C)-98.1 F (36.7 C)] 97.8 F (36.6 C) (03/30 0759) Pulse Rate:  [79-88] 81 (03/30 0759) Cardiac Rhythm: Heart block (03/30 0759) Resp:  [14-20] 14 (03/30 0759) BP: (88-108)/(53-66) 100/59 (03/30 0759) SpO2:  [94 %-99 %] 97 % (03/30 0759) Weight:  [65.3 kg] 65.3 kg (03/30 0557)  Hemodynamic parameters for last 24 hours: CVP:  [6 mmHg] 6 mmHg  Intake/Output from previous day: 03/29 0701 - 03/30 0700 In: 773 [P.O.:720; I.V.:3; IV Piggyback:50] Out: -  Intake/Output this shift: No intake/output data recorded.  General appearance: alert, cooperative, and no distress Neurologic: intact Heart: Regular rate and rhythm, appears to be in an accelerated junctional rhythm. Lungs: Normal work of breathing on room air, breath sounds are clear, full, and equal. Abdomen: Soft, no tenderness Extremities: No peripheral edema Wound: Sternotomy incision is well oxygenated and dry  Lab Results: Recent Labs    05/17/22 0503 05/18/22 0442  WBC 10.5 10.6*  HGB 8.4* 8.6*  HCT 26.4* 26.3*  PLT 387 442*   BMET:  Recent Labs    05/17/22 0503 05/18/22 0442  NA 135 136  K 3.1* 3.3*  CL 98 98  CO2 25 25  GLUCOSE 104* 95  BUN 9 6*  CREATININE 0.89 0.99  CALCIUM 8.3* 8.5*    PT/INR: No results for input(s): "LABPROT", "INR" in the last 72 hours. ABG    Component  Value Date/Time   PHART 7.407 05/08/2022 1522   HCO3 22.1 05/08/2022 1522   TCO2 23 05/08/2022 1522   ACIDBASEDEF 2.0 05/08/2022 1522   O2SAT 65 05/18/2022 0442   CBG (last 3)  Recent Labs    05/17/22 1946 05/17/22 2340 05/18/22 0559  GLUCAP 112* 123* 95    Assessment/Plan: S/P Procedure(s) (LRB): AORTIC VALVE REPLACEMENT (AVR) USING INSPIRIS VALVE SIZE 21MM (N/A) MITRAL VALVE (MV) REPLACEMENT USING MOSAIC 310 CINCH SIZE 31MM (N/A) TRANSESOPHAGEAL ECHOCARDIOGRAM (N/A)  -Postop day 11 aortic and mitral valve replacements with bioprosthetic valves.  Moderate LV dysfunction pre and postoperatively.  She was supported with milrinone and epinephrine for several days after surgery for acute on chronic heart failure.  Off vasoactive drips for past 48 hours.  Heart failure team is following closely and managing cardiac medications.  -Hyponatremia operatively-sodium is now normalized  -Postoperative paroxysmal atrial fibrillation-currently sinus rhythm with first-degree heart block versus accelerated junctional rhythm.  On apixaban, digoxin, and amiodarone.   She has received about 7 g amiodarone in total since initiation early postop.  Continue current dosing for now but expect we can reduce the dose at discharge.  -History of CKD, stage IIIa.  Creatinine stable 0.99.  K+ 3.3 today, magnesium 1.9.  Replace potassium today, spironolactone was added yesterday.  -Expected acute blood loss anemia-transfused earlier this admission.  Hemoglobin is trending up, 8.6  today.  She is on iron replacement.  -Disposition-progressing well with mobility.  Anticipate discharge to home with home health.  No equipment have been recommended by PT/OT.  Appears she will be ready for discharge from CT surgery standpoint in the next day or 2 if the heart failure team agrees.   LOS: 11 days    Antony Odea, PA-C 05/18/2022

## 2022-05-19 ENCOUNTER — Inpatient Hospital Stay (HOSPITAL_COMMUNITY): Payer: Medicare HMO

## 2022-05-19 LAB — BASIC METABOLIC PANEL
Anion gap: 9 (ref 5–15)
BUN: 8 mg/dL (ref 8–23)
CO2: 24 mmol/L (ref 22–32)
Calcium: 8.3 mg/dL — ABNORMAL LOW (ref 8.9–10.3)
Chloride: 102 mmol/L (ref 98–111)
Creatinine, Ser: 1.09 mg/dL — ABNORMAL HIGH (ref 0.44–1.00)
GFR, Estimated: 54 mL/min — ABNORMAL LOW (ref >60)
Glucose, Bld: 93 mg/dL (ref 70–99)
Potassium: 4 mmol/L (ref 3.5–5.1)
Sodium: 135 mmol/L (ref 135–145)

## 2022-05-19 LAB — CBC
HCT: 26 % — ABNORMAL LOW (ref 36.0–46.0)
Hemoglobin: 8.2 g/dL — ABNORMAL LOW (ref 12.0–15.0)
MCH: 29.1 pg (ref 26.0–34.0)
MCHC: 31.5 g/dL (ref 30.0–36.0)
MCV: 92.2 fL (ref 80.0–100.0)
Platelets: 468 10*3/uL — ABNORMAL HIGH (ref 150–400)
RBC: 2.82 MIL/uL — ABNORMAL LOW (ref 3.87–5.11)
RDW: 15.6 % — ABNORMAL HIGH (ref 11.5–15.5)
WBC: 9.6 10*3/uL (ref 4.0–10.5)
nRBC: 0 % (ref 0.0–0.2)

## 2022-05-19 LAB — GLUCOSE, CAPILLARY
Glucose-Capillary: 93 mg/dL (ref 70–99)
Glucose-Capillary: 103 mg/dL — ABNORMAL HIGH (ref 70–99)
Glucose-Capillary: 109 mg/dL — ABNORMAL HIGH (ref 70–99)
Glucose-Capillary: 101 mg/dL — ABNORMAL HIGH (ref 70–99)

## 2022-05-19 LAB — COOXEMETRY PANEL
Carboxyhemoglobin: 2.1 % — ABNORMAL HIGH (ref 0.5–1.5)
Methemoglobin: <0.7 % (ref 0.0–1.5)
O2 Saturation: 66.8 %
Total hemoglobin: 8.7 g/dL — ABNORMAL LOW (ref 12.0–16.0)

## 2022-05-19 MED ORDER — AMIODARONE HCL 200 MG PO TABS
200.0000 mg | ORAL_TABLET | Freq: Two times a day (BID) | ORAL | Status: DC
  Administered 2022-05-19 – 2022-05-20 (×3): 200 mg via ORAL
  Filled 2022-05-19 (×3): qty 1

## 2022-05-19 NOTE — Progress Notes (Addendum)
[address]       [place] [address]             [phone]      12 Days Post-Op Procedure(s) (LRB): AORTIC VALVE REPLACEMENT (AVR) USING INSPIRIS VALVE SIZE 21MM (N/A) MITRAL VALVE (MV) REPLACEMENT USING MOSAIC 310 CINCH SIZE 31MM (N/A) TRANSESOPHAGEAL ECHOCARDIOGRAM (N/A) Subjective: Had an episode of coughing followed by vomiting early this morning. Otherwise had a good day yesterday, no nausea now.   Remains on room air with no shortness of breath and adequate O2 sats.  Objective: Vital signs in last 24 hours: Temp:  [97.5 F (36.4 C)-98.8 F (37.1 C)] 98.3 F (36.8 C) (03/31 0807) Pulse Rate:  [71-88] 83 (03/31 0335) Cardiac Rhythm: Heart block (03/31 0700) Resp:  [12-21] 20 (03/31 0335) BP: (88-124)/(51-113) 124/113 (03/31 0807) SpO2:  [97 %-99 %] 99 % (03/31 0757) Weight:  [62.5 kg] 62.5 kg (03/31 0335)  Hemodynamic parameters for last 24 hours: CVP:  [7 mmHg-13 mmHg] 13 mmHg  Intake/Output from previous day: No intake/output data recorded. Intake/Output this shift: No intake/output data recorded.  General appearance: alert, cooperative, and no distress Neurologic: intact Heart: Regular rate and rhythm, appeared to be in an accelerated junctional rhythm for past few days but looks more like SR with 1st degree AVB this morning. No further atrial fibrillation. Lungs: Normal work of breathing on room air, breath sounds are clear, full, and equal. Abdomen: Soft, no tenderness Extremities: No peripheral edema, right arm PICC site OK Wound: Sternotomy incision is well approximated and dry  Lab Results: Recent Labs    05/18/22 0442 05/19/22 0255  WBC 10.6* 9.6  HGB 8.6* 8.2*  HCT 26.3* 26.0*  PLT 442* 468*    BMET:  Recent Labs    05/18/22 0442 05/19/22 0255  NA 136 135  K 3.3* 4.0  CL 98 102  CO2 25 24  GLUCOSE 95 93  BUN 6* 8  CREATININE 0.99 1.09*  CALCIUM 8.5* 8.3*     PT/INR: No results for input(s): "LABPROT",  "INR" in the last 72 hours. ABG    Component Value Date/Time   PHART 7.407 05/08/2022 1522   HCO3 22.1 05/08/2022 1522   TCO2 23 05/08/2022 1522   ACIDBASEDEF 2.0 05/08/2022 1522   O2SAT 66.8 05/19/2022 0250   CBG (last 3)  Recent Labs    05/18/22 1710 05/18/22 2114 05/19/22 0607  GLUCAP 87 106* 93     Assessment/Plan: S/P Procedure(s) (LRB): AORTIC VALVE REPLACEMENT (AVR) USING INSPIRIS VALVE SIZE 21MM (N/A) MITRAL VALVE (MV) REPLACEMENT USING MOSAIC 310 CINCH SIZE 31MM (N/A) TRANSESOPHAGEAL ECHOCARDIOGRAM (N/A)  -Postop day 12 aortic and mitral valve replacements with bioprosthetic valves.  Moderate LV dysfunction pre and postoperatively.  She was supported with milrinone and epinephrine for several days after surgery for acute on chronic heart failure.  Off vasoactive drips for past 3 days.  Heart failure team managing cardiac medications.  -Hyponatremia operatively-sodium has now normalized  -Postoperative paroxysmal atrial fibrillation-currently sinus rhythm with first-degree heart block versus accelerated junctional rhythm.  On apixaban, digoxin, and amiodarone.   She has received about 8 g amiodarone in total since initiation early postop.  Decrease the amiodarone to 200mg  BID  -History of CKD, stage IIIa.  Creatinine stable 1.09.  K+ 4.0 today, magnesium 1.9.    -Expected acute blood loss anemia-transfused earlier this admission.  Hemoglobin is stable.  She is on iron replacement.  -Disposition-progressing well with mobility.  Anticipate  discharge to home with home health.  No equipment have been recommended by PT/OT.  Appears she will be ready for discharge from CT surgery standpoint in the next day or 2 if the heart failure team agrees.   LOS: 12 days    Antony Odea, PA-C 05/19/2022  Patient seen this AM Emesis x 1  Abd exam benign Seems to be related to episode of coughing.  Has passed BM since surgery

## 2022-05-19 NOTE — Progress Notes (Signed)
Mobility Specialist Progress Note    05/19/22 1429  Mobility  Activity Ambulated with assistance in hallway  Level of Assistance Standby assist, set-up cues, supervision of patient - no hands on  Assistive Device Front wheel walker  Distance Ambulated (ft) 470 ft  Activity Response Tolerated well  Mobility Referral Yes  $Mobility charge 1 Mobility   Pre-Mobility: 75 HR During Mobility: 94 HR Post-Mobility: 86 HR  Pt received in chair and agreeable. No complaints on walk. Returned to chair with call bell in reach.   Hildred Alamin Mobility Specialist  Please Psychologist, sport and exercise or Rehab Office at [phone]

## 2022-05-19 NOTE — Progress Notes (Signed)
This RN called to pt's room by Shannon [name], NT due to patient having episode of vomiting.  Per pt, she was coughing and felt like she had phlegm stuck in her throat and it made her gag and throw up.  Emeisis clear with clear/white phlegm noted as well.  Pt medicated per MAR.  Pt has no other complaints or concerns at this time.

## 2022-05-20 ENCOUNTER — Other Ambulatory Visit (HOSPITAL_COMMUNITY): Payer: Self-pay

## 2022-05-20 LAB — BASIC METABOLIC PANEL
Anion gap: 10 (ref 5–15)
BUN: 7 mg/dL — ABNORMAL LOW (ref 8–23)
CO2: 25 mmol/L (ref 22–32)
Calcium: 8.7 mg/dL — ABNORMAL LOW (ref 8.9–10.3)
Chloride: 101 mmol/L (ref 98–111)
Creatinine, Ser: 1.02 mg/dL — ABNORMAL HIGH (ref 0.44–1.00)
GFR, Estimated: 58 mL/min — ABNORMAL LOW (ref >60)
Glucose, Bld: 101 mg/dL — ABNORMAL HIGH (ref 70–99)
Potassium: 3.3 mmol/L — ABNORMAL LOW (ref 3.5–5.1)
Sodium: 136 mmol/L (ref 135–145)

## 2022-05-20 LAB — GLUCOSE, CAPILLARY
Glucose-Capillary: 89 mg/dL (ref 70–99)
Glucose-Capillary: 113 mg/dL — ABNORMAL HIGH (ref 70–99)

## 2022-05-20 LAB — COOXEMETRY PANEL
Carboxyhemoglobin: 4 % — ABNORMAL HIGH (ref 0.5–1.5)
Methemoglobin: <0.7 % (ref 0.0–1.5)
O2 Saturation: 66.7 %
Total hemoglobin: 8.8 g/dL — ABNORMAL LOW (ref 12.0–16.0)

## 2022-05-20 MED ORDER — DIGOXIN 125 MCG PO TABS
0.1250 mg | ORAL_TABLET | Freq: Every day | ORAL | 1 refills | Status: DC
  Filled 2022-05-20: qty 30, 30d supply, fill #0

## 2022-05-20 MED ORDER — FE FUM-VIT C-VIT B12-FA 460-60-0.01-1 MG PO CAPS
1.0000 | ORAL_CAPSULE | Freq: Every day | ORAL | 2 refills | Status: DC
  Filled 2022-05-20: qty 30, 30d supply, fill #0

## 2022-05-20 MED ORDER — ASPIRIN 81 MG PO TBEC: 81.0000 mg | DELAYED_RELEASE_TABLET | Freq: Every day | ORAL | 12 refills | Status: AC

## 2022-05-20 MED ORDER — FUROSEMIDE 40 MG PO TABS
40.0000 mg | ORAL_TABLET | Freq: Every day | ORAL | 1 refills | Status: DC
  Filled 2022-05-20: qty 30, 30d supply, fill #0

## 2022-05-20 MED ORDER — OXYCODONE HCL 5 MG PO TABS
5.0000 mg | ORAL_TABLET | Freq: Four times a day (QID) | ORAL | 0 refills | Status: AC | PRN
  Filled 2022-05-20: qty 28, 7d supply, fill #0

## 2022-05-20 MED ORDER — POTASSIUM CHLORIDE CRYS ER 20 MEQ PO TBCR
40.0000 meq | EXTENDED_RELEASE_TABLET | Freq: Every day | ORAL | 1 refills | Status: DC
  Filled 2022-05-20: qty 60, 30d supply, fill #0

## 2022-05-20 MED ORDER — APIXABAN 5 MG PO TABS
5.0000 mg | ORAL_TABLET | Freq: Two times a day (BID) | ORAL | 1 refills | Status: DC
  Filled 2022-05-20: qty 60, 30d supply, fill #0

## 2022-05-20 MED ORDER — AMIODARONE HCL 200 MG PO TABS
ORAL_TABLET | ORAL | 1 refills | Status: DC
  Filled 2022-05-20: qty 60, 53d supply, fill #0

## 2022-05-20 MED ORDER — POTASSIUM CHLORIDE CRYS ER 20 MEQ PO TBCR
20.0000 meq | EXTENDED_RELEASE_TABLET | Freq: Three times a day (TID) | ORAL | Status: DC
  Administered 2022-05-20: 20 meq via ORAL
  Filled 2022-05-20: qty 1

## 2022-05-20 MED ORDER — AMIODARONE HCL 200 MG PO TABS
200.0000 mg | ORAL_TABLET | Freq: Two times a day (BID) | ORAL | 1 refills | Status: DC
  Filled 2022-05-20: qty 60, 30d supply, fill #0

## 2022-05-20 MED ORDER — POTASSIUM CHLORIDE CRYS ER 20 MEQ PO TBCR
40.0000 meq | EXTENDED_RELEASE_TABLET | Freq: Every day | ORAL | 1 refills | Status: DC
  Filled 2022-05-20: qty 30, 15d supply, fill #0

## 2022-05-20 NOTE — Progress Notes (Signed)
PICC line removed per MD order. Patient education given: stay in bed for 30 minutes; keep dressing clean, dry, & intact for 24 hours; no lifting for 24 hours with R arm; signs of infection/when to call the MD (redness, swelling, pain, discharge, fever).

## 2022-05-20 NOTE — Progress Notes (Addendum)
Advanced Heart Failure Rounding Note  PCP-Cardiologist: Quay Burow, MD   Subjective:    S/p AVR/MVR. Post-op Echo EF 30-35%, RV mod reduced. Stable AV and MV prosthesis   POD # 13   Now out of ICU. Did well over the weekend, ambulated no complaints. BP 90s- low 100s. Denies dizziness. Co-ox 67%. CVP 4/5  Feels good, denies CP/SOB.    Objective:   Weight Range: 62.5 kg Body mass index is 22.24 kg/m.   Vital Signs:   Temp:  [98.1 F (36.7 C)-98.4 F (36.9 C)] 98.1 F (36.7 C) (04/01 0300) Pulse Rate:  [74-86] 82 (04/01 0800) Resp:  [18-20] 18 (04/01 0800) BP: (92-101)/(55-59) 92/55 (04/01 0300) SpO2:  [96 %-99 %] 99 % (03/31 2321) Weight:  [62.5 kg] 62.5 kg (04/01 0302) Last BM Date : 05/19/22  Weight change: Filed Weights   05/18/22 0557 05/19/22 0335 05/20/22 0302  Weight: 65.3 kg 62.5 kg 62.5 kg   Intake/Output:  No intake or output data in the 24 hours ending 05/20/22 0852   Physical Exam   CVP 4/5 General:  elderly appearing.  No respiratory difficulty HEENT: normal Neck: supple. JVD ~5 cm. Carotids 2+ bilat; no bruits. No lymphadenopathy or thyromegaly appreciated. Cor: PMI nondisplaced. Regular rate & rhythm. No rubs, gallops or murmurs. Lungs: clear Abdomen: soft, nontender, nondistended. No hepatosplenomegaly. No bruits or masses. Good bowel sounds. Extremities: no cyanosis, clubbing, rash, edema. PICC RUE Neuro: alert & oriented x 3, cranial nerves grossly intact. moves all 4 extremities w/o difficulty. Affect pleasant.   Telemetry   NSR 70s  (Personally reviewed)    Labs    CBC Recent Labs    05/18/22 0442 05/19/22 0255  WBC 10.6* 9.6  HGB 8.6* 8.2*  HCT 26.3* 26.0*  MCV 89.8 92.2  PLT 442* [id number]*   Basic Metabolic Panel Recent Labs    05/18/22 0442 05/19/22 0255 05/20/22 0305  NA 136 135 136  K 3.3* 4.0 3.3*  CL 98 102 101  CO2 25 24 25   GLUCOSE 95 93 101*  BUN 6* 8 7*  CREATININE 0.99 1.09* 1.02*  CALCIUM 8.5* 8.3*  8.7*  MG 1.9  --   --    Liver Function Tests No results for input(s): "AST", "ALT", "ALKPHOS", "BILITOT", "PROT", "ALBUMIN" in the last 72 hours. No results for input(s): "LIPASE", "AMYLASE" in the last 72 hours. Cardiac Enzymes No results for input(s): "CKTOTAL", "CKMB", "CKMBINDEX", "TROPONINI" in the last 72 hours.  BNP: BNP (last 3 results) Recent Labs    03/18/22 1540  BNP 58.1    ProBNP (last 3 results) Recent Labs    02/26/22 1546  PROBNP 2,865*   D-Dimer No results for input(s): "DDIMER" in the last 72 hours. Hemoglobin A1C No results for input(s): "HGBA1C" in the last 72 hours. Fasting Lipid Panel No results for input(s): "CHOL", "HDL", "LDLCALC", "TRIG", "CHOLHDL", "LDLDIRECT" in the last 72 hours. Thyroid Function Tests No results for input(s): "TSH", "T4TOTAL", "T3FREE", "THYROIDAB" in the last 72 hours.  Invalid input(s): "FREET3"  Other results:  Imaging   DG Chest 2 View  Result Date: 05/19/2022 CLINICAL DATA:  Pleural effusion EXAM: CHEST - 2 VIEW COMPARISON:  05/13/2022 FINDINGS: Cardiac shadow is stable. Postsurgical changes are noted. Right PICC is seen in satisfactory position. No focal infiltrate or sizable effusion is noted. No bony abnormality is seen. IMPRESSION: No active cardiopulmonary disease. Electronically Signed   By: Inez Catalina M.D.   On: 05/19/2022 10:40  Medications:   Scheduled Medications:  sodium chloride   Intravenous Once   amiodarone  200 mg Oral BID   apixaban  5 mg Oral BID   aspirin EC  81 mg Oral Daily   atorvastatin  80 mg Oral q1800   bisacodyl  10 mg Oral Daily   Or   bisacodyl  10 mg Rectal Daily   busPIRone  10 mg Oral BID   Chlorhexidine Gluconate Cloth  6 each Topical Daily   colchicine  0.6 mg Oral Daily   dapagliflozin propanediol  10 mg Oral Daily   digoxin  0.125 mg Oral Daily   docusate sodium  200 mg Oral Daily   ezetimibe  10 mg Oral Daily   Fe Fum-Vit C-Vit B12-FA  1 capsule Oral QPC breakfast    feeding supplement  1 Container Oral TID BM   FLUoxetine  20 mg Oral Daily   fluticasone  1 spray Each Nare Daily   furosemide  40 mg Oral Daily   insulin aspart  0-24 Units Subcutaneous TID AC & HS   mometasone-formoterol  2 puff Inhalation BID   montelukast  10 mg Oral QHS   pantoprazole  40 mg Oral Daily   potassium chloride  20 mEq Oral TID   sodium chloride flush  3 mL Intravenous Q12H   sodium chloride flush  3 mL Intravenous Q12H   spironolactone  12.5 mg Oral QHS    Infusions:  sodium chloride     sodium chloride 10 mL/hr at 05/10/22 1919   sodium chloride     lactated ringers 20 mL/hr at 05/10/22 0925    PRN Medications: sodium chloride, albuterol, ondansetron (ZOFRAN) IV, mouth rinse, oxyCODONE, sodium chloride flush, sodium chloride flush, traMADol  Patient Profile   Shannon [name] is a 72 y.o. 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 AHF team consulted for management of post-cardiotomy shock.  Assessment/Plan  1. Valvular heart disease - s/p MVR/AVR 3/19 - post-op TEE: EF was 30-40% and the valves were well seated and the mitral had a mean of 7mmHg and the aortic had a mean of 72mmHg  - post-op TTE Post-op Echo EF 30-35%, RV mod reduced, prosthetic valves ok    Acute on chronic systolic CHF -  Echo in 0000000 EF 40-45%, mild RV dilation, moderate pulmonary HTN, mod-severe MR and moderate-severe TR.  - RHC/LHC (1/24) with elevated left and right heart filling pressures, pulmonary venous hypertension, preserved cardiac output.  NICM. Cause is uncertain, ?valvular disease.  - Post-op Echo EF 30-35%, RV mod reduced. - Stable off inotropes/pressors, Co-ox 67%, euvolemic  - Continue farxiga 10 mg daily - Continue digoxin 0.125 mg - Consider increasing spiro 12.5 mg QHS, however BP on the lower side - Continue 40 mg PO Lasix   Pulmonary hypertension - Group 2 pulmonary venous  hypertension.    Hyponatremia - 136 today  H/o TIA/CVA - On plavix at home, anticoagulation per TCTS   CKD 3a, h/o AKI - stable Scr 1 - daily BMET    7. Thrombocytopenia - resolved   8. Hypokalemia/hypomag - K 3.3, Mg 1.9  - supp K and Mg - Continue spiro  9. Accelerated junctional rhythm (vs sinus with long 1AVB) - Continue amio 200 bid - now in NSR/ST. Watch with addition of digoxin  10. Anemia - s/p 1uPRBc 3/25 - hgb now stable  11. Left Metatarsal Pain  - treating  empirically for gout - pain improved w/ colchicine   Appears stable for discharge pending MD eval. Will schedule post-hosp f/u.   AHF meds at d/c:  Amiodarone 200 mg BID x 7 days then 200 mg daily after that Eliquis 5 mg BID ASA 81 mg daily Atorvastatin 80 mg QHS Farxiga 10 mg daily Digoxin .125 mg daily Zetia 10 mg daily Lasix 40 mg daily Potassium 40 mEq daily Spironolactone 12.5 mg QHS  Length of Stay: [place], NP  05/20/2022, 8:52 AM  Advanced Heart Failure Team Pager [phone] (M-F; 7a - 5p)  Please contact [facility] for night-coverage after hours (5p -7a ) and weekends on [email or web address]  Patient seen with NP, agree with the above note.   Did well with PT this morning, no dyspnea.    Co-ox 67%, CVP 4-5.  SBP 90s-100s.   General: NAD Neck: No JVD, no thyromegaly or thyroid nodule.  Lungs: Clear to auscultation bilaterally with normal respiratory effort. CV: Nondisplaced PMI.  Heart regular S1/S2, no S3/S4, 1/6 SEM RUSB.  No peripheral edema.   Abdomen: Soft, nontender, no hepatosplenomegaly, no distention.  Skin: Intact without lesions or rashes.  Neurologic: Alert and oriented x 3.  Psych: Normal affect. Extremities: No clubbing or cyanosis.  HEENT: Normal.   She remains in NSR on amiodarone.  Volume status looks good on po Lasix.  SBP too low to titrate GDMT further at this time.   I agree that patient is ready for home today.  She can go home on the above medication  regimen.  We will make followup in CHF clinic. Repeat echo in 3 months or so.   Loralie Champagne 05/20/2022 11:40 AM

## 2022-05-20 NOTE — Care Management Important Message (Signed)
Important Message  Patient Details  Name: ALEY HEMMER MRN: [id number] Date of Birth: [birth year]   Medicare Important Message Given:  Yes     Shelda Altes 05/20/2022, 12:38 PM

## 2022-05-20 NOTE — Progress Notes (Signed)
[address]       [place] [address]             [phone]     13 Days Post-Op Procedure(s) (LRB): AORTIC VALVE REPLACEMENT (AVR) USING INSPIRIS VALVE SIZE 21MM (N/A) MITRAL VALVE (MV) REPLACEMENT USING MOSAIC 310 CINCH SIZE 31MM (N/A) TRANSESOPHAGEAL ECHOCARDIOGRAM (N/A) Subjective: Feels well, no specific c/o  Objective: Vital signs in last 24 hours: Temp:  [98.1 F (36.7 C)-98.4 F (36.9 C)] 98.1 F (36.7 C) (04/01 0300) Pulse Rate:  [74-86] 86 (03/31 2321) Cardiac Rhythm: Heart block;Bundle branch block (03/31 1944) Resp:  [18-20] 20 (04/01 0300) BP: (92-124)/(55-113) 92/55 (04/01 0300) SpO2:  [96 %-99 %] 99 % (03/31 2321) Weight:  [62.5 kg] 62.5 kg (04/01 0302)  Hemodynamic parameters for last 24 hours: CVP:  [15 mmHg] 15 mmHg  Intake/Output from previous day: No intake/output data recorded. Intake/Output this shift: No intake/output data recorded.  General appearance: alert, cooperative, and no distress Heart: regular rate and rhythm Lungs: clear to auscultation bilaterally Abdomen: benign Extremities: no edema Wound: incis healing wel  Lab Results: Recent Labs    05/18/22 0442 05/19/22 0255  WBC 10.6* 9.6  HGB 8.6* 8.2*  HCT 26.3* 26.0*  PLT 442* 468*   BMET:  Recent Labs    05/19/22 0255 05/20/22 0305  NA 135 136  K 4.0 3.3*  CL 102 101  CO2 24 25  GLUCOSE 93 101*  BUN 8 7*  CREATININE 1.09* 1.02*  CALCIUM 8.3* 8.7*    PT/INR: No results for input(s): "LABPROT", "INR" in the last 72 hours. ABG    Component Value Date/Time   PHART 7.407 05/08/2022 1522   HCO3 22.1 05/08/2022 1522   TCO2 23 05/08/2022 1522   ACIDBASEDEF 2.0 05/08/2022 1522   O2SAT 66.7 05/20/2022 0305   CBG (last 3)  Recent Labs    05/19/22 1556 05/19/22 2111 05/20/22 0643  GLUCAP 109* 101* 89    Meds Scheduled Meds:  sodium chloride   Intravenous Once   amiodarone  200 mg Oral BID   apixaban  5 mg Oral BID   aspirin EC  81 mg  Oral Daily   atorvastatin  80 mg Oral q1800   bisacodyl  10 mg Oral Daily   Or   bisacodyl  10 mg Rectal Daily   busPIRone  10 mg Oral BID   Chlorhexidine Gluconate Cloth  6 each Topical Daily   colchicine  0.6 mg Oral Daily   dapagliflozin propanediol  10 mg Oral Daily   digoxin  0.125 mg Oral Daily   docusate sodium  200 mg Oral Daily   ezetimibe  10 mg Oral Daily   Fe Fum-Vit C-Vit B12-FA  1 capsule Oral QPC breakfast   feeding supplement  1 Container Oral TID BM   FLUoxetine  20 mg Oral Daily   fluticasone  1 spray Each Nare Daily   furosemide  40 mg Oral Daily   insulin aspart  0-24 Units Subcutaneous TID AC & HS   mometasone-formoterol  2 puff Inhalation BID   montelukast  10 mg Oral QHS   pantoprazole  40 mg Oral Daily   sodium chloride flush  3 mL Intravenous Q12H   sodium chloride flush  3 mL Intravenous Q12H   spironolactone  12.5 mg Oral QHS   Continuous Infusions:  sodium chloride     sodium chloride 10 mL/hr at 05/10/22 1919   sodium chloride  lactated ringers 20 mL/hr at 05/10/22 0925   PRN Meds:.sodium chloride, albuterol, ondansetron (ZOFRAN) IV, mouth rinse, oxyCODONE, sodium chloride flush, sodium chloride flush, traMADol  Xrays DG Chest 2 View  Result Date: 05/19/2022 CLINICAL DATA:  Pleural effusion EXAM: CHEST - 2 VIEW COMPARISON:  05/13/2022 FINDINGS: Cardiac shadow is stable. Postsurgical changes are noted. Right PICC is seen in satisfactory position. No focal infiltrate or sizable effusion is noted. No bony abnormality is seen. IMPRESSION: No active cardiopulmonary disease. Electronically Signed   By: Inez Catalina M.D.   On: 05/19/2022 10:40    Assessment/Plan: S/P Procedure(s) (LRB): AORTIC VALVE REPLACEMENT (AVR) USING INSPIRIS VALVE SIZE 21MM (N/A) MITRAL VALVE (MV) REPLACEMENT USING MOSAIC 310 CINCH SIZE 31MM (N/A) TRANSESOPHAGEAL ECHOCARDIOGRAM (N/A) POD#13  1 afeb,  VSS, s BP 90's-120's, Co Ox 66.7- AHF assisting with med management, sinus  v junct/or 1 deg AVB,  on eliquis and amio 2 O2 sats good on RA, will hold on ARB for now 3 voiding well, not measured 4 K= 3.3 - replace, normal creat, BUN slightly low at 7 5 CXR yesterday without significant infilts or effusions 6 appears stable for d/c if ok w/ AHF team    LOS: 13 days    John Giovanni PA-C Pager [phone]  05/20/2022

## 2022-05-20 NOTE — Progress Notes (Signed)
Per pts AVS d/c medications were coming from [facility]. Pt received a call from [facility] stating medications can not be filled because they were just filled at pts regular pharmacy and it is too early to refill the scripts. Therefore pt is leaving without meds in hand. Pt did state she has all the medications she needs at home.

## 2022-05-20 NOTE — TOC Transition Note (Addendum)
Transition of Care [facility]) - CM/SW Discharge Note   Patient Details  Name: Shannon [name] MRN: [id number] Date of Birth: 01-23-1951  [facility] CM/SW Contact:  Erenest Rasher, RN Phone Number: [phone] 05/20/2022, 12:04 PM   Clinical Narrative:    Spoke to pt at bedside. States her sistere will provide transportation home. CM contacted [facility] for 3n1 bedside commode. Contacted [facility] for [facility], scheduled dc home today.    Final next level of care: Home w Home Health Services Barriers to Discharge: No Barriers Identified   Patient Goals and CMS Choice CMS Medicare.gov Compare Post Acute Care list provided to:: Patient Choice offered to / list presented to : Patient  Discharge Placement                         Discharge Plan and Services Additional resources added to the After Visit Summary for     Discharge Planning Services: CM Consult Post Acute Care Choice: Home Health          DME Arranged: 3-N-1 DME Agency: [facility] Date DME Agency Contacted: 05/20/22 Time DME Agency Contacted: [id number] Representative spoke with at DME Agency: Rica Koyanagi HH Arranged: RN, PT [facility] Agency: [place] Date [place]: 05/17/22 Time [place]: 1024 Representative spoke with at [place]: [place] ([place]) Interventions SDOH Screenings   Food Insecurity: No Food Insecurity (05/09/2022)  Housing: Low Risk  (05/09/2022)  Transportation Needs: No Transportation Needs (05/09/2022)  Utilities: Not At Risk (05/09/2022)  Depression (PHQ2-9): Low Risk  (06/15/2018)  Tobacco Use: Medium Risk (05/08/2022)     Readmission Risk Interventions     No data to display

## 2022-05-20 NOTE — TOC CM/SW Note (Signed)
Medical Necessity Bedside Commode  Patient has limited mobility. Patient has HF and medications that require frequent trips to bathroom. A bedside commode will help prevent falls.

## 2022-05-20 NOTE — Progress Notes (Signed)
CARDIAC REHAB PHASE I   PRE:  Rate/Rhythm: 76 first deg    BP: sitting 96/58    SpO2:   MODE:  Ambulation: 810 ft   POST:  Rate/Rhythm: 100 first deg    BP: sitting 101/53     SpO2: 95 RA  Pt needed min-mod assist to stand due to not moving hips closer to EOB. Ambulated hall independently with RW. No c/o, increased distance. To recliner. Reviewed walking at home, daily wts, sternal precautions. Pt eager to d/c.  1000-1020   Yves Dill BS, ACSM-CEP 05/20/2022 10:26 AM

## 2022-05-20 NOTE — Progress Notes (Addendum)
Physical Therapy Treatment Patient Details Name: Shannon [name] MRN: [id number] DOB: 1950/10/24 Today's Date: 05/20/2022   History of Present Illness 72 y.o. female admitted 3/19 for AVR/MVR. 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    PT Comments    Pt received in recliner, agreeable to therapy session and with good participation and tolerance for transfer, gait and stair training. Pt needing up to [place] assist to stand from low surface heights (toilet seat and chair) while maintaining sternal precautions and would benefit from [facility] for home safety and to optimize pt compliance with sternal precs. Discussed DME updated with pt and supervising PT Beth [facility] and both agreeable, case mgr notified. Handouts printed to reinforce Move in the Tube precautions and HEP given pt mild noted memory deficits. Pt continues to benefit from PT services to progress toward functional mobility goals.   Recommendations for follow up therapy are one component of a multi-disciplinary discharge planning process, led by the attending physician.  Recommendations may be updated based on patient status, additional functional criteria and insurance authorization.  Follow Up Recommendations       Assistance Recommended at Discharge Frequent or constant Supervision/Assistance  Patient can return home with the following A little help with walking and/or transfers;A little help with bathing/dressing/bathroom;Assistance with cooking/housework;Assist for transportation;Help with stairs or ramp for entrance   Equipment Recommendations  BSC/3in1 (pt needs heavy lift assist from toilet height and difficulty with sternal precs from low height)    Recommendations for Other Services       Precautions / Restrictions Precautions Precautions: Sternal Precaution Booklet Issued: Yes (comment) Restrictions Weight Bearing Restrictions: No (sternal precautions) Other  Position/Activity Restrictions: Move in the Tube     Mobility  Bed Mobility               General bed mobility comments: Sitting up in recliner on entry and up in chair at end of session    Transfers Overall transfer level: Needs assistance Equipment used: Rolling walker (2 wheels) Transfers: Sit to/from Stand Sit to Stand: Mod assist, Min assist           General transfer comment: 2 attempts required to come all the way to standing from chair and lower toilet surfaces, use of momentum on second attempt from each surface.    Ambulation/Gait Ambulation/Gait assistance: Supervision Gait Distance (Feet): 300 Feet Assistive device: Rolling walker (2 wheels) Gait Pattern/deviations: Step-through pattern, Decreased step length - right, Decreased step length - left, Narrow base of support Gait velocity: slowed     General Gait Details: Supervision for safety, steady appropriate gait, good navigation around obstacles in the hallway; difficulty wayfinding at times (turning into wrong room)   Stairs Stairs: Yes Stairs assistance: Min assist Stair Management: No rails, Step to pattern, Forwards Number of Stairs: 4 General stair comments: HHA with cues for sternal precs and step-to pattern with single 7" step in room x4 reps   Wheelchair Mobility    Modified Rankin (Stroke Patients Only)       Balance Overall balance assessment: Needs assistance Sitting-balance support: Feet supported, No upper extremity supported Sitting balance-Leahy Scale: Good     Standing balance support: Bilateral upper extremity supported, During functional activity, Reliant on assistive device for balance, No upper extremity supported Standing balance-Leahy Scale: Fair Standing balance comment: Able to static stand without assist prior to stepping up/down from platform step in room ~20-30 seconds a couple reps,  no LOB, supervision/min guard for safety; mostly using RW without LOB                             Cognition Arousal/Alertness: Awake/alert Behavior During Therapy: WFL for tasks assessed/performed Overall Cognitive Status: Within Functional Limits for tasks assessed                                 General Comments: Pt needed reminders each transfer for improved body mechanics for transfers within precs and toward end of session, pt turning into incorrect room from hallway. Not sure of pt baseline memory.        Exercises      General Comments General comments (skin integrity, edema, etc.): HR 98 bpm with exertion, no notable dypsnea; no c/o dizziness with ambulation      Pertinent Vitals/Pain Pain Assessment Pain Assessment: No/denies pain Pain Intervention(s): Monitored during session     PT Goals (current goals can now be found in the care plan section) Acute Rehab PT Goals Patient Stated Goal: be able to plant her flowers PT Goal Formulation: With patient Time For Goal Achievement: 05/30/22 Progress towards PT goals: Progressing toward goals    Frequency    Min 1X/week      PT Plan Equipment recommendations need to be updated       AM-PAC PT "6 Clicks" Mobility   Outcome Measure  Help needed turning from your back to your side while in a flat bed without using bedrails?: None Help needed moving from lying on your back to sitting on the side of a flat bed without using bedrails?: A Little Help needed moving to and from a bed to a chair (including a wheelchair)?: A Lot Help needed standing up from a chair using your arms (e.g., wheelchair or bedside chair)?: A Lot Help needed to walk in hospital room?: A Little Help needed climbing 3-5 steps with a railing? : A Little 6 Click Score: 17    End of Session Equipment Utilized During Treatment: Gait belt Activity Tolerance: Patient tolerated treatment well Patient left: in chair;with call bell/phone within reach;Other (comment) (MD entering her room to speak with  her) Nurse Communication: Mobility status;Other (comment) (pt needs BSC) PT Visit Diagnosis: Other abnormalities of gait and mobility (R26.89);Muscle weakness (generalized) (M62.81);Difficulty in walking, not elsewhere classified (R26.2);Pain Pain - Right/Left: Left Pain - part of body: Ankle and joints of foot     Time: [id number] PT Time Calculation (min) (ACUTE ONLY): 22 min  Charges:  $Gait Training: 8-22 mins                     Rudy Luhmann P., PTA Acute Rehabilitation Services Secure Chat Preferred 9a-5:30pm Office: [place] 05/20/2022, 11:57 AM

## 2022-05-21 ENCOUNTER — Ambulatory Visit: Payer: Medicare HMO

## 2022-05-21 LAB — EXPECTORATED SPUTUM ASSESSMENT W GRAM STAIN, RFLX TO RESP C

## 2022-05-22 MED FILL — Electrolyte-R (PH 7.4) Solution: INTRAVENOUS | Qty: 3000 | Status: AC

## 2022-05-22 MED FILL — Heparin Sodium (Porcine) Inj 1000 Unit/ML: INTRAMUSCULAR | Qty: 10 | Status: AC

## 2022-05-22 MED FILL — Calcium Chloride Inj 10%: INTRAVENOUS | Qty: 10 | Status: AC

## 2022-05-22 MED FILL — Sodium Chloride IV Soln 0.9%: INTRAVENOUS | Qty: 2000 | Status: AC

## 2022-05-22 MED FILL — Mannitol IV Soln 20%: INTRAVENOUS | Qty: 500 | Status: AC

## 2022-05-22 MED FILL — Sodium Bicarbonate IV Soln 8.4%: INTRAVENOUS | Qty: 50 | Status: AC

## 2022-05-24 ENCOUNTER — Other Ambulatory Visit: Payer: Self-pay | Admitting: Thoracic Surgery (Cardiothoracic Vascular Surgery)

## 2022-05-24 DIAGNOSIS — Z48812 Encounter for surgical aftercare following surgery on the circulatory system: Secondary | ICD-10-CM | POA: Diagnosis not present

## 2022-05-24 DIAGNOSIS — Z952 Presence of prosthetic heart valve: Secondary | ICD-10-CM

## 2022-05-28 ENCOUNTER — Ambulatory Visit (INDEPENDENT_AMBULATORY_CARE_PROVIDER_SITE_OTHER): Payer: Self-pay | Admitting: Physician Assistant

## 2022-05-28 ENCOUNTER — Encounter: Payer: Self-pay | Admitting: Physician Assistant

## 2022-05-28 ENCOUNTER — Ambulatory Visit
Admission: RE | Admit: 2022-05-28 | Discharge: 2022-05-28 | Disposition: A | Discharge status: Home / Self Care | Payer: Medicare HMO | Source: Ambulatory Visit | Attending: Thoracic Surgery (Cardiothoracic Vascular Surgery) | Admitting: Thoracic Surgery (Cardiothoracic Vascular Surgery)

## 2022-05-28 VITALS — BP 130/85 | HR 89 | Resp 20 | Ht 66.0 in | Wt 131.0 lb

## 2022-05-28 DIAGNOSIS — Z952 Presence of prosthetic heart valve: Secondary | ICD-10-CM

## 2022-05-28 NOTE — Progress Notes (Signed)
[address]       [place] [address]             [phone]      HPI: Ms. Demayo returns for routine postoperative follow-up having undergone Aortic Valve Replacement with a 21 mm Inspiris Pericardial Valve and Mitral Valve Replacement with a 31 mm Mosaic Porcine Valve by Dr. Leafy Ro on 05/07/22. The patient's early postoperative recovery while in the hospital was notable for acute on chronic heart failure with an EF of 30-25%, biventricular failure and pulmonary hypertension for which the advanced heart failure team was consulted and managed with milrinone and GDMT, acute postoperative blood loss anemia which improved with PRBC transfusion, and AKI on CKD stage IIIa which resolved before discharge. She also experienced some ventricular ectopy but remained in sinus rhythm on Amiodarone and Digoxin. Lastly she had an acute gout flare of her right ankle for which she was given Colchicine which likely lead to a frequent stooling, nausea and vomiting episode that resolved before discharge. She was discharged in stable condition with home health on 05/20/22  Since hospital discharge the patient reports some dizziness and had one episode of palpitations that was short lived and had no corresponding dizziness or LOC. She has some chest soreness and is taking pain medication about twice per day. She denies shortness of breath, chest pain, and chest tightness.  She sees the AHF team on 06/04/22  Current Outpatient Medications  Medication Sig Dispense Refill   albuterol (PROVENTIL) (2.5 MG/3ML) 0.083% nebulizer solution Take 3 mLs (2.5 mg total) by nebulization every 6 (six) hours as needed for wheezing or shortness of breath. 75 mL 12   amiodarone (PACERONE) 200 MG tablet Take 1 tablet twice daily for 7 days then on 4/8 take 1 tablet once daily 60 tablet 1   apixaban (ELIQUIS) 5 MG TABS tablet Take 1 tablet (5 mg total) by mouth 2 (two) times daily. 60 tablet 1   aspirin EC  81 MG tablet Take 1 tablet (81 mg total) by mouth daily. Swallow whole. 30 tablet 12   atorvastatin (LIPITOR) 80 MG tablet Take 1 tablet (80 mg total) by mouth daily at 6 PM. 30 tablet 2   busPIRone (BUSPAR) 10 MG tablet Take 10 mg by mouth 2 (two) times daily.     Cholecalciferol (VITAMIN D) 50 MCG (2000 UT) tablet Take 2,000 Units by mouth daily.     dapagliflozin propanediol (FARXIGA) 10 MG TABS tablet Take 1 tablet (10 mg total) by mouth daily. 30 tablet 5   desloratadine (CLARINEX) 5 MG tablet Take 5 mg by mouth daily.     digoxin (LANOXIN) 0.125 MG tablet Take 1 tablet (0.125 mg total) by mouth daily. 30 tablet 1   ezetimibe (ZETIA) 10 MG tablet Take 10 mg by mouth daily.     Fe Fum-Vit C-Vit B12-FA (TRIGELS-F FORTE) CAPS capsule Take 1 capsule by mouth daily after breakfast. 30 capsule 2   FLUoxetine (PROZAC) 20 MG capsule Take 20 mg by mouth daily.     fluticasone (FLONASE) 50 MCG/ACT nasal spray Place 1 spray into both nostrils daily.     fluticasone-salmeterol (WIXELA INHUB) 500-50 MCG/ACT AEPB Inhale 1 puff into the lungs in the morning and at bedtime. 60 each 6   furosemide (LASIX) 40 MG tablet Take 1 tablet (40 mg total) by mouth daily. 30 tablet 1   montelukast (SINGULAIR) 10 MG tablet Take 1 tablet (10 mg total) by mouth at  bedtime. 30 tablet 11   pantoprazole (PROTONIX) 40 MG tablet Take 1 tablet (40 mg total) by mouth daily. 90 tablet 1   potassium chloride SA (KLOR-CON M) 20 MEQ tablet Take 2 tablets (40 mEq total) by mouth daily. 60 tablet 1   spironolactone (ALDACTONE) 25 MG tablet Take 1/2 tablet (12.5 mg total) by mouth daily. 30 tablet 3   No current facility-administered medications for this visit.   Vitals: Today's Vitals   05/28/22 1357  BP: 130/85  Pulse: 89  Resp: 20  SpO2: 93%  Weight: 131 lb (59.4 kg)  Height: 5\' 6"  (1.676 m)   Body mass index is 21.14 kg/m.  Physical Exam: General: No acute distress Neuro: Alert and oriented, grossly intact CV:  Regular rate and rhythm, slight prosthetic valve click, no murmur Pulm: Clear to auscultation bilaterally Extremities: No edema Wounds: Clean and dry without erythema or sign of infection  Diagnostic Tests: CLINICAL DATA:  Provided history: Status post mitral valve replacement.   EXAM: CHEST - 2 VIEW   COMPARISON:  Prior chest radiographs 05/19/2022 and earlier.   FINDINGS: Previously demonstrated right-sided PICC no longer present. Heart size is within normal limits. Prior median sternotomy, aortic valve replacement and mitral valve replacement. Aortic atherosclerosis. No appreciable airspace consolidation or pulmonary edema. No evidence of pleural effusion or pneumothorax. No acute osseous abnormality identified. Levocurvature of the lower thoracic spine.   IMPRESSION: 1.  No evidence of acute cardiopulmonary abnormality. 2. Prior median sternotomy, aortic valve replacement and mitral valve replacement. 3.  Aortic Atherosclerosis (ICD10-I70.0).     Electronically Signed   By: Jackey Loge D.O.   On: 05/28/2022 13:48    Impression/Plan Ms. Graf is a 72 year old woman about 3 weeks postop progressing quite well from bioprosthetic AVR/MVR surgery. She admits to occasional dizziness but denies LOC, chest pain, chest tightness and SOB. She had one episode of palpitations that was asymptomatic and resolved quickly. Her sternal incision is healing well without sign of infection and her chest tube sutures were removed in the office today. We discussed continued sternal precautions and no driving until she is no longer taking pain medication. I educated her on endocarditis prophylaxis. She has an appointment with the advanced heart failure team next week. Will plan to continue current medication regimen and follow up with Dr. Leafy Ro in 3 weeks with CXR for cardiac rehab clearance.    Jenny Reichmann, PA-C [facility] [phone]

## 2022-05-28 NOTE — Patient Instructions (Signed)
Continue to avoid any heavy lifting or strenuous use of your arms or shoulders for at least a total of three months from the time of surgery.  After three months you may gradually increase how much you lift or otherwise use your arms or chest as tolerated, with limits based upon whether or not activities lead to the return of significant discomfort.  You may return to driving an automobile as long as you are no longer requiring oral narcotic pain relievers during the daytime.  It would be wise to start driving only short distances during the daylight and gradually increase from there as you feel comfortable.  Endocarditis is a potentially serious infection of heart valves or inside lining of the heart.  It occurs more commonly in patients with diseased heart valves (such as patient's with aortic or mitral valve disease) and in patients who have undergone heart valve repair or replacement.  Certain surgical and dental procedures may put you at risk, such as dental cleaning, other dental procedures, or any surgery involving the respiratory, urinary, gastrointestinal tract, gallbladder or prostate gland.   To minimize your chances for develooping endocarditis, maintain good oral health and seek prompt medical attention for any infections involving the mouth, teeth, gums, skin or urinary tract.    Always notify your doctor or dentist about your underlying heart valve condition before having any invasive procedures. You will need to take antibiotics before certain procedures, including all routine dental cleanings or other dental procedures.  Your cardiologist or dentist should prescribe these antibiotics for you to be taken ahead of time.

## 2022-06-03 NOTE — Progress Notes (Signed)
ADVANCED HF CLINIC NOTE   Primary Care: Shannon Schultz, FNP HF Cardiologist: Dr. Shirlee Latch  HPI: 72 y.o. female w/ h/o chronic diastolic heart failure, HTN, HLD, h/o TIA, GERD and asthma. Former smoker (quit 72 y/o). Noted to have high coronary calcium score of 507 in March 2022. Referred to lipid clinic for aggressive tx of HLD but did not undergo cath in absence of symptoms. Echo 04/2018 showed normal LVEF and RV. No significant valvular dysfunction.    Recently referred back to cardiology for surgical clearance prior to undergoing planned EGD and EBUS (ordered given progressive dysphagia, wt loss and abnormal chest CT). She endorsed exertional dyspnea w/ inability to complete of activity w/o symptoms. Echo was done and showed drop in EF down to 40-45% w/ diffuse HK, severe LAE mod-severe MR, severely elevated RVSP, RV normal, mod-severe RAE and mod-severe TR. Subsequently referred for [facility].    [facility] 1/24 showed no significant CAD, only 10% pRCA narrowing, elevated R+ L heart filling pressures and marginal output, (PA saturation 48%, mean RA pressure 18 mmHg, PA pressure 50/28, mean PA pressure 37 mmHg, pulmonary capillary wedge pressure 35/36, mean pulmonary capillary wedge pressure 30 mmHg, cardiac output 3.92 L/min, cardiac index 2.27).    She was direct admitted from the cath lab for IV diuresis. TEE showed EF 30-35%, mild RV dysfunction, severe MR, moderate TR, moderate AI.  Cardiac MRI showed LV EF 18%, RV EF 25%, ?severe AI, moderate-severe MR (regurgitant fraction not likely accurate with low SV), non-coronary mid-wall LGE in the basal septal wall.  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  She had dysphagia and underwent EGD/colonoscopy showing esophageal candidiasis, unremarkable colonoscopy. Hospitalization also complicated by episode of SVT requiring adenosine. Her GDMT was titrated, but limited by soft BP. Structural Heart  team to review TEE to decide on mTEER candidacy. She was discharged home, weight 134 lbs.  Today she returns for HF follow up. Overall feeling fine. She is not short of breath walking up steps or pushing grocery cart. Denies palpitations, abnormal bleeding,  CP, dizziness, edema, or PND/Orthopnea. Appetite ok. No fever or chills. Weight at home 134 pounds. Taking all medications.  Retired from [employer]. Lives at home with her son. No tobacco use, 2-3 ETOH drinks on the weekends, no drugs. She snores occasionally, had a sleep study a couple years ago.  ECG (personally reviewed): none ordered today.  Labs (1/24): K 4.4, creatinine 1.42, AST 85, ALT 73 Labs (1/24): K 3.6, creatinine 2.03  PMH: 1. 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  2. COPD: Prior smoker.  3. Mediastinal lymphadenopathy: PET-CT with hypermetabolic lymphadenopathy, ?lymphoproliferative disorder vs primary bronchogenic carcinoma vs sarcoidosis.  4. CKD stage 3 5. SVT: Occurred in 1/24, required adenosine.  6. Chronic systolic CHF:  Nonischemic cardiomyopathy.  - Echo (3/20): EF 50-55%, RV normal, mild MR - Echo (1/24): EF 40-45%, Severe LAE, mod-severe MR, severely elevated RVSP, RV normal, mod-severe RAE, mod-severe TR  - TEE (1/24): EF 30-35%, severe MR, moderate TR, moderate AI - Cardiac MRI (1/24): LVEF 18%, RVEF 25%, moderate AI visually but regurgitant fraction 49% suggests severe AI, moderate-severe MR,  non-coronary mid-wall LGE in the basal septal wall, cannot rule out cardiac sarcoidosis (unexplained mediastinal LAN) - R/LHC (1/24): Mild nonobstructive CAD; RA mean 18, PA 50/28 (mean 37), PCWP 30, CO/CI: 3.92/ 2.27 7. Valvular heart disease:  TEE in 1/24 showed severe MR.  The valve is abnormal with thickening of the  leaflet tips and poor coapatation, ?underlying rheumatic or inflammatory etiology though not classic appearance for rheumatic heart disease and no mitral stenosis. Difficult to quantify mitral  regurgitation by MRI due to low stroke volume (do not think regurgitant fraction is accurate). The aortic valve is also abnormal with thickening and doming.  Aortic insufficiency was moderate by TEE though regurgitant volume by cMRI suggested severe AI (I still suspect truly in moderate range).  8. GERD  Review of Systems: All systems reviewed and negative except as per HPI.    Current Outpatient Medications  Medication Sig Dispense Refill   albuterol (PROVENTIL) (2.5 MG/3ML) 0.083% nebulizer solution Take 3 mLs (2.5 mg total) by nebulization every 6 (six) hours as needed for wheezing or shortness of breath. 75 mL 12   amiodarone (PACERONE) 200 MG tablet Take 1 tablet twice daily for 7 days then on 4/8 take 1 tablet once daily 60 tablet 1   apixaban (ELIQUIS) 5 MG TABS tablet Take 1 tablet (5 mg total) by mouth 2 (two) times daily. 60 tablet 1   aspirin EC 81 MG tablet Take 1 tablet (81 mg total) by mouth daily. Swallow whole. 30 tablet 12   atorvastatin (LIPITOR) 80 MG tablet Take 1 tablet (80 mg total) by mouth daily at 6 PM. 30 tablet 2   busPIRone (BUSPAR) 10 MG tablet Take 10 mg by mouth 2 (two) times daily.     Cholecalciferol (VITAMIN D) 50 MCG (2000 UT) tablet Take 2,000 Units by mouth daily.     dapagliflozin propanediol (FARXIGA) 10 MG TABS tablet Take 1 tablet (10 mg total) by mouth daily. 30 tablet 5   desloratadine (CLARINEX) 5 MG tablet Take 5 mg by mouth daily.     digoxin (LANOXIN) 0.125 MG tablet Take 1 tablet (0.125 mg total) by mouth daily. 30 tablet 1   ezetimibe (ZETIA) 10 MG tablet Take 10 mg by mouth daily.     Fe Fum-Vit C-Vit B12-FA (TRIGELS-F FORTE) CAPS capsule Take 1 capsule by mouth daily after breakfast. 30 capsule 2   FLUoxetine (PROZAC) 20 MG capsule Take 20 mg by mouth daily.     fluticasone (FLONASE) 50 MCG/ACT nasal spray Place 1 spray into both nostrils daily.     fluticasone-salmeterol (WIXELA INHUB) 500-50 MCG/ACT AEPB Inhale 1 puff into the lungs in the  morning and at bedtime. 60 each 6   furosemide (LASIX) 40 MG tablet Take 1 tablet (40 mg total) by mouth daily. 30 tablet 1   montelukast (SINGULAIR) 10 MG tablet Take 1 tablet (10 mg total) by mouth at bedtime. 30 tablet 11   pantoprazole (PROTONIX) 40 MG tablet Take 1 tablet (40 mg total) by mouth daily. 90 tablet 1   potassium chloride SA (KLOR-CON M) 20 MEQ tablet Take 2 tablets (40 mEq total) by mouth daily. 60 tablet 1   spironolactone (ALDACTONE) 25 MG tablet Take 1/2 tablet (12.5 mg total) by mouth daily. 30 tablet 3   No current facility-administered medications for this visit.   Allergies  Allergen Reactions   Amoxicillin-Pot Clavulanate Itching   Lactose Intolerance (Gi) Diarrhea   Other Other (See Comments)    08/06/2018 -    06/05/2016 -    09/15/2014 -    06/16/2014 -   Social History   Socioeconomic History   Marital status: Widowed    Spouse name: Not on file   Number of children: Not on file   Years of education: Not on file   Highest education  level: Not on file  Occupational History   Not on file  Tobacco Use   Smoking status: Former    Packs/day: 0.50    Years: 30.00    Additional pack years: 0.00    Total pack years: 15.00    Types: Cigarettes    Quit date: 11/10/1998    Years since quitting: 23.5   Smokeless tobacco: Never  Vaping Use   Vaping Use: Never used  Substance and Sexual Activity   Alcohol use: Yes    Comment: occasional   Drug use: No   Sexual activity: Not Currently  Other Topics Concern   Not on file  Social History Narrative   Not on file   Social Determinants of Health   Financial Resource Strain: Not on file  Food Insecurity: No Food Insecurity (05/09/2022)   Hunger Vital Sign    Worried About Running Out of Food in the Last Year: Never true    Ran Out of Food in the Last Year: Never true  Transportation Needs: No Transportation Needs (05/09/2022)   PRAPARE - Administrator, Civil Service (Medical): No    Lack of  Transportation (Non-Medical): No  Physical Activity: Not on file  Stress: Not on file  Social Connections: Not on file  Intimate Partner Violence: Not At Risk (05/09/2022)   Humiliation, Afraid, Rape, and Kick questionnaire    Fear of Current or Ex-Partner: No    Emotionally Abused: No    Physically Abused: No    Sexually Abused: No   Family History  Problem Relation Age of Onset   Hypertension Mother    Hypertension Father    Colon cancer Father    Hypertension Sister    There were no vitals taken for this visit.  Wt Readings from Last 3 Encounters:  05/28/22 59.4 kg (131 lb)  05/20/22 62.5 kg (137 lb 12.6 oz)  05/03/22 63.2 kg (139 lb 6.4 oz)   PHYSICAL EXAM: General:  NAD. No resp difficulty, walked into clinic HEENT: Normal Neck: Supple. No JVD. Carotids 2+ bilat; no bruits. No lymphadenopathy or thryomegaly appreciated. Cor: PMI nondisplaced. Regular rate & rhythm. No rubs, gallops, 2/6 HSM apex Lungs: Clear Abdomen: Soft, nontender, nondistended. No hepatosplenomegaly. No bruits or masses. Good bowel sounds. Extremities: No cyanosis, clubbing, rash, edema Neuro: Alert & oriented x 3, cranial nerves grossly intact. Moves all 4 extremities w/o difficulty. Affect pleasant.  ASSESSMENT & PLAN: 1. Chronic systolic CHF: Echo in 3/20 showed low normal EF 50-55%, no significant MR.  Echo in 1/24 showed EF 40-45%, mild RV dilation, moderate pulmonary HTN, mod-severe MR and moderate-severe TR.  RHC/LHC (1/24) with elevated left and right heart filling pressures, pulmonary venous hypertension, preserved cardiac output.  Nonischemic cardiomyopathy.  Cause is uncertain, ?valvular disease. TEE done 03/01/22 showed EF 30-35%, mild RV dysfunction, severe MR, moderate TR, moderate AI.  Cardiac MRI 1/12 showed LV EF 18%, RV EF 25%, ? severe AI, moderate-severe MR (regurgitant fraction not likely accurate with low SV), non-coronary mid-wall LGE in the basal septal wall.  Consider prior  myocarditis, cannot rule out cardiac sarcoidosis (unexplained mediastinal LAN).  Today, she is NYHA I-II, she is not volume overloaded on exam.  - Continue Toprol XL 12.5 mg qhs.  - Continue torsemide 10 mg with every other day. BMET today - Continue losartan 12.5 mg daily (Entresto stopped with low BP). - Continue Farxiga 10 mg daily.  - Continue spironolactone 12.5 daily.   - Ongoing workup of mediastinal  LAN with Dr. Tonia Brooms, ? Sarcoidosis, planning EBUS tomorrow with Dr. Tonia Brooms.  2. Valvular heart disease: TEE showed severe MR.  The valve is abnormal with thickening of the leaflet tips and poor coapatation, ?underlying rheumatic or inflammatory etiology though not classic appearance for rheumatic heart disease and no mitral stenosis. Difficult to quantify mitral regurgitation by MRI due to low stroke volume (do not think regurgitant fraction is accurate). The aortic valve is also abnormal with thickening and doming.  Aortic insufficiency was moderate by TEE though regurgitant volume by cMRI suggested severe AI (I still suspect truly in moderate range).  - The structural heart team reviewed TEE images and felt she is not suitable for mTEER. We have referred her to Dr. Leafy Ro for evaluation for surgical mitral valve replacement, as well as AVR. Will call to follow up on this today. 3. Pulmonary hypertension: Group 2 pulmonary venous hypertension.  4. Dysphagia: EGD showed esophageal candidiasis, no definite stricture. Had esophageal dilatation.  - Continue fluconazole for candidiasis.  5. H/o TIA/CVA: She has been on Plavix long-term.   - Continue Plavix, will stop before EBUS. 6. Pulmonary: Prior smoker with COPD. PET-CT showed hypermetabolic mediastinal LAN, either lymphoproliferative disorder or primary bronchogenic CA considered.  I also wonder if this could represent sarcoidosis.  - Has EBUS tomorrow with Dr. Tonia Brooms.  7. H/o AKI: Labs today. 8. SVT: Episode of SVT 1/12, required adenosine to  break. No prior history.  Asymptomatic - Continue Toprol.   Follow up in 3 months with Dr. Shirlee Latch. Will see if we can expedite CVTS referral.  Prince Rome, FNP-BC 06/03/22

## 2022-06-04 ENCOUNTER — Encounter (HOSPITAL_COMMUNITY): Payer: Self-pay

## 2022-06-04 ENCOUNTER — Ambulatory Visit (HOSPITAL_COMMUNITY)
Admit: 2022-06-04 | Discharge: 2022-06-04 | Disposition: A | Discharge status: Home / Self Care | Payer: Medicare HMO | Attending: Family Medicine | Admitting: Family Medicine

## 2022-06-04 VITALS — BP 122/84 | HR 84 | Wt 131.2 lb

## 2022-06-04 DIAGNOSIS — N1831 Chronic kidney disease, stage 3a: Secondary | ICD-10-CM | POA: Diagnosis not present

## 2022-06-04 DIAGNOSIS — J4489 Other specified chronic obstructive pulmonary disease: Secondary | ICD-10-CM | POA: Diagnosis not present

## 2022-06-04 DIAGNOSIS — I130 Hypertensive heart and chronic kidney disease with heart failure and stage 1 through stage 4 chronic kidney disease, or unspecified chronic kidney disease: Secondary | ICD-10-CM | POA: Diagnosis not present

## 2022-06-04 DIAGNOSIS — Z87891 Personal history of nicotine dependence: Secondary | ICD-10-CM | POA: Diagnosis not present

## 2022-06-04 DIAGNOSIS — G459 Transient cerebral ischemic attack, unspecified: Secondary | ICD-10-CM

## 2022-06-04 DIAGNOSIS — I2722 Pulmonary hypertension due to left heart disease: Secondary | ICD-10-CM | POA: Diagnosis not present

## 2022-06-04 DIAGNOSIS — Z79899 Other long term (current) drug therapy: Secondary | ICD-10-CM | POA: Diagnosis not present

## 2022-06-04 DIAGNOSIS — E785 Hyperlipidemia, unspecified: Secondary | ICD-10-CM | POA: Insufficient documentation

## 2022-06-04 DIAGNOSIS — Z8673 Personal history of transient ischemic attack (TIA), and cerebral infarction without residual deficits: Secondary | ICD-10-CM | POA: Diagnosis not present

## 2022-06-04 DIAGNOSIS — K219 Gastro-esophageal reflux disease without esophagitis: Secondary | ICD-10-CM | POA: Diagnosis not present

## 2022-06-04 DIAGNOSIS — Z7982 Long term (current) use of aspirin: Secondary | ICD-10-CM | POA: Insufficient documentation

## 2022-06-04 DIAGNOSIS — I498 Other specified cardiac arrhythmias: Secondary | ICD-10-CM

## 2022-06-04 DIAGNOSIS — I340 Nonrheumatic mitral (valve) insufficiency: Secondary | ICD-10-CM | POA: Diagnosis not present

## 2022-06-04 DIAGNOSIS — I272 Pulmonary hypertension, unspecified: Secondary | ICD-10-CM | POA: Diagnosis not present

## 2022-06-04 DIAGNOSIS — Z7901 Long term (current) use of anticoagulants: Secondary | ICD-10-CM | POA: Insufficient documentation

## 2022-06-04 DIAGNOSIS — R9431 Abnormal electrocardiogram [ECG] [EKG]: Secondary | ICD-10-CM

## 2022-06-04 DIAGNOSIS — N183 Chronic kidney disease, stage 3 unspecified: Secondary | ICD-10-CM

## 2022-06-04 DIAGNOSIS — I5022 Chronic systolic (congestive) heart failure: Secondary | ICD-10-CM | POA: Insufficient documentation

## 2022-06-04 LAB — BASIC METABOLIC PANEL
Anion gap: 11 (ref 5–15)
BUN: 17 mg/dL (ref 8–23)
CO2: 27 mmol/L (ref 22–32)
Calcium: 9.7 mg/dL (ref 8.9–10.3)
Chloride: 100 mmol/L (ref 98–111)
Creatinine, Ser: 1.55 mg/dL — ABNORMAL HIGH (ref 0.44–1.00)
GFR, Estimated: 35 mL/min — ABNORMAL LOW (ref >60)
Glucose, Bld: 131 mg/dL — ABNORMAL HIGH (ref 70–99)
Potassium: 4.5 mmol/L (ref 3.5–5.1)
Sodium: 138 mmol/L (ref 135–145)

## 2022-06-04 LAB — BRAIN NATRIURETIC PEPTIDE: B Natriuretic Peptide: 82.7 pg/mL (ref 0.0–100.0)

## 2022-06-04 LAB — CBC
HCT: 39.3 % (ref 36.0–46.0)
Hemoglobin: 12.6 g/dL (ref 12.0–15.0)
MCH: 29.4 pg (ref 26.0–34.0)
MCHC: 32.1 g/dL (ref 30.0–36.0)
MCV: 91.6 fL (ref 80.0–100.0)
Platelets: 263 10*3/uL (ref 150–400)
RBC: 4.29 MIL/uL (ref 3.87–5.11)
RDW: 15.4 % (ref 11.5–15.5)
WBC: 8.9 10*3/uL (ref 4.0–10.5)
nRBC: 0 % (ref 0.0–0.2)

## 2022-06-04 LAB — TSH: TSH: 1.952 u[IU]/mL (ref 0.350–4.500)

## 2022-06-04 LAB — MAGNESIUM: Magnesium: 1.9 mg/dL (ref 1.7–2.4)

## 2022-06-04 MED ORDER — LOSARTAN POTASSIUM 25 MG PO TABS: 12.5000 mg | ORAL_TABLET | Freq: Every evening | ORAL | 3 refills | Status: DC

## 2022-06-04 NOTE — Patient Instructions (Addendum)
START Losartan 12.5 mg nightly at bedtime CHANGE Prozac to 20 mg every other day  Labs today We will only contact you if something comes back abnormal or we need to make some changes. Otherwise no news is good news!  Your physician recommends that you schedule a follow-up appointment in: 1 week for a nurse visit (EKG) 3 weeks with the pharmacy team (hold digoxin until after this appointment), 6 weeks  in the Advanced Practitioners (PA/NP) Clinic, and in 12 weeks with Dr Shirlee Latch and echo  Your physician has requested that you have an echocardiogram. Echocardiography is a painless test that uses sound waves to create images of your heart. It provides your doctor with information about the size and shape of your heart and how well your heart's chambers and valves are working. This procedure takes approximately one hour. There are no restrictions for this procedure. Please do NOT wear cologne, perfume, aftershave, or lotions (deodorant is allowed). Please arrive 15 minutes prior to your appointment time.  Do the following things EVERYDAY: Weigh yourself in the morning before breakfast. Write it down and keep it in a log. Take your medicines as prescribed Eat low salt foods--Limit salt (sodium) to 2000 mg per day.  Stay as active as you can everyday Limit all fluids for the day to less than 2 liters  At the [facility], you and your health needs are our priority. As part of our continuing mission to provide you with exceptional heart care, we have created designated Provider Care Teams. These Care Teams include your primary Cardiologist (physician) and Advanced Practice Providers (APPs- Physician Assistants and Nurse Practitioners) who all work together to provide you with the care you need, when you need it.   You may see any of the following providers on your designated Care Team at your next follow up: Dr Arvilla Meres Dr Marca Ancona Dr. Marcos Eke,  NP Robbie Lis, [facility] [place], [place] Brynda Peon, NP Karle Plumber, PharmD   Please be sure to bring in all your medications bottles to every appointment.    Thank you for choosing [facility]

## 2022-06-11 ENCOUNTER — Ambulatory Visit (HOSPITAL_COMMUNITY)
Admission: RE | Admit: 2022-06-11 | Discharge: 2022-06-11 | Disposition: A | Discharge status: Home / Self Care | Payer: Medicare HMO | Source: Ambulatory Visit | Attending: Cardiology | Admitting: Cardiology

## 2022-06-11 DIAGNOSIS — I5022 Chronic systolic (congestive) heart failure: Secondary | ICD-10-CM | POA: Insufficient documentation

## 2022-06-14 ENCOUNTER — Other Ambulatory Visit: Payer: Self-pay | Admitting: Thoracic Surgery (Cardiothoracic Vascular Surgery)

## 2022-06-14 DIAGNOSIS — Z952 Presence of prosthetic heart valve: Secondary | ICD-10-CM

## 2022-06-16 NOTE — Progress Notes (Unsigned)
[address]       [place] [address]             [phone]           JAISLYN BLINN [facility] Medical Record #[id number] Date of Birth: 24-Jul-1950  Laurey Morale, MD Donato Schultz, FNP  Chief Complaint:  Follow up MVR/AVR  History of Present Illness:     Pt now about 5 weeks out from above surgery. She is doing well. She reports having some off days with tiredness and "jittery upper extremities" but overall things continue to improve. She has no CP or DOE.        Past Medical History:  Diagnosis Date   Anxiety    Arthritis    "right knee" (05/26/2014)   Asthma    CHF (congestive heart failure) (HCC)    chronic diastolic heart failure - sees Dr. Shirlee Latch   Chronic lower back pain    Dysrhythmia    irregular heart beat   GERD (gastroesophageal reflux disease)    HLD (hyperlipidemia)    Hypertension    Pneumonia    Stroke [facility])    TIA - ?2021   TIA (transient ischemic attack) 04/2018    Past Surgical History:  Procedure Laterality Date   AORTIC VALVE REPLACEMENT N/A 05/07/2022   Procedure: AORTIC VALVE REPLACEMENT (AVR) USING INSPIRIS VALVE SIZE ;  Surgeon: Eugenio Hoes, MD;  Location: [facility] OR;  Service: Open Heart Surgery;  Laterality: N/A;   BRONCHIAL BIOPSY  07/10/2021   Procedure: BRONCHIAL BIOPSIES;  Surgeon: Josephine Igo, DO;  Location: [facility] ENDOSCOPY;  Service: Pulmonary;;   BRONCHIAL BRUSHINGS  07/10/2021   Procedure: BRONCHIAL BRUSHINGS;  Surgeon: Josephine Igo, DO;  Location: [facility] ENDOSCOPY;  Service: Pulmonary;;   BRONCHIAL NEEDLE ASPIRATION BIOPSY  07/10/2021   Procedure: BRONCHIAL NEEDLE ASPIRATION BIOPSIES;  Surgeon: Josephine Igo, DO;  Location: [facility] ENDOSCOPY;  Service: Pulmonary;;   BRONCHIAL NEEDLE ASPIRATION BIOPSY  04/16/2022   Procedure: BRONCHIAL NEEDLE ASPIRATION BIOPSIES;  Surgeon: Josephine Igo, DO;  Location: [facility] ENDOSCOPY;  Service: Cardiopulmonary;;   BRONCHIAL WASHINGS  07/10/2021   Procedure:  BRONCHIAL WASHINGS;  Surgeon: Josephine Igo, DO;  Location: [facility] ENDOSCOPY;  Service: Pulmonary;;   COLONOSCOPY WITH PROPOFOL N/A 03/03/2022   Procedure: COLONOSCOPY WITH PROPOFOL;  Surgeon: Jeani Hawking, MD;  Location: [facility] ENDOSCOPY;  Service: Gastroenterology;  Laterality: N/A;   ESOPHAGOGASTRODUODENOSCOPY N/A 05/27/2014   Procedure: ESOPHAGOGASTRODUODENOSCOPY (EGD);  Surgeon: Jeani Hawking, MD;  Location: [facility] ENDOSCOPY;  Service: Endoscopy;  Laterality: N/A;   ESOPHAGOGASTRODUODENOSCOPY (EGD) WITH PROPOFOL N/A 03/03/2022   Procedure: ESOPHAGOGASTRODUODENOSCOPY (EGD) WITH PROPOFOL;  Surgeon: Jeani Hawking, MD;  Location: [facility] ENDOSCOPY;  Service: Gastroenterology;  Laterality: N/A;   FRACTURE SURGERY     MITRAL VALVE REPLACEMENT N/A 05/07/2022   Procedure: MITRAL VALVE (MV) REPLACEMENT USING MOSAIC 310 CINCH SIZE ;  Surgeon: Eugenio Hoes, MD;  Location: [facility] OR;  Service: Open Heart Surgery;  Laterality: N/A;   PATELLA FRACTURE SURGERY Right ~ 2009   RIGHT/LEFT HEART CATH AND CORONARY ANGIOGRAPHY N/A 02/28/2022   Procedure: RIGHT/LEFT HEART CATH AND CORONARY ANGIOGRAPHY;  Surgeon: Corky Crafts, MD;  Location: [facility] INVASIVE CV LAB;  Service: Cardiovascular;  Laterality: N/A;   SAVORY DILATION N/A 03/03/2022   Procedure: SAVORY DILATION;  Surgeon: Jeani Hawking, MD;  Location: [facility] ENDOSCOPY;  Service: Gastroenterology;  Laterality: N/A;   TEE WITHOUT CARDIOVERSION N/A 03/01/2022   Procedure: TRANSESOPHAGEAL ECHOCARDIOGRAM (TEE);  Surgeon: Laurey Morale, MD;  Location: [facility] ENDOSCOPY;  Service: Cardiovascular;  Laterality: N/A;   TEE WITHOUT CARDIOVERSION N/A 05/07/2022   Procedure: TRANSESOPHAGEAL ECHOCARDIOGRAM;  Surgeon: Eugenio Hoes, MD;  Location: [facility] OR;  Service: Open Heart Surgery;  Laterality: N/A;   TONSILLECTOMY  ~ 1970   VAGINAL HYSTERECTOMY  1980's   VIDEO BRONCHOSCOPY WITH ENDOBRONCHIAL ULTRASOUND Bilateral 04/16/2022   Procedure: VIDEO BRONCHOSCOPY WITH ENDOBRONCHIAL ULTRASOUND;   Surgeon: Josephine Igo, DO;  Location: [facility] ENDOSCOPY;  Service: Cardiopulmonary;  Laterality: Bilateral;   VIDEO BRONCHOSCOPY WITH RADIAL ENDOBRONCHIAL ULTRASOUND  07/10/2021   Procedure: RADIAL ENDOBRONCHIAL ULTRASOUND;  Surgeon: Josephine Igo, DO;  Location: [facility] ENDOSCOPY;  Service: Pulmonary;;    Social History   Tobacco Use  Smoking Status Former   Packs/day: 0.50   Years: 30.00   Additional pack years: 0.00   Total pack years: 15.00   Types: Cigarettes   Quit date: 11/10/1998   Years since quitting: 23.6  Smokeless Tobacco Never    Social History   Substance and Sexual Activity  Alcohol Use Yes   Comment: occasional    Social History   Socioeconomic History   Marital status: Widowed    Spouse name: Not on file   Number of children: Not on file   Years of education: Not on file   Highest education level: Not on file  Occupational History   Not on file  Tobacco Use   Smoking status: Former    Packs/day: 0.50    Years: 30.00    Additional pack years: 0.00    Total pack years: 15.00    Types: Cigarettes    Quit date: 11/10/1998    Years since quitting: 23.6   Smokeless tobacco: Never  Vaping Use   Vaping Use: Never used  Substance and Sexual Activity   Alcohol use: Yes    Comment: occasional   Drug use: No   Sexual activity: Not Currently  Other Topics Concern   Not on file  Social History Narrative   Not on file   Social Determinants of Health   Financial Resource Strain: Not on file  Food Insecurity: No Food Insecurity (05/09/2022)   Hunger Vital Sign    Worried About Running Out of Food in the Last Year: Never true    Ran Out of Food in the Last Year: Never true  Transportation Needs: No Transportation Needs (05/09/2022)   PRAPARE - Administrator, Civil Service (Medical): No    Lack of Transportation (Non-Medical): No  Physical Activity: Not on file  Stress: Not on file  Social Connections: Not on file  Intimate Partner Violence:  Not At Risk (05/09/2022)   Humiliation, Afraid, Rape, and Kick questionnaire    Fear of Current or Ex-Partner: No    Emotionally Abused: No    Physically Abused: No    Sexually Abused: No    Allergies  Allergen Reactions   Amoxicillin-Pot Clavulanate Itching   Lactose Intolerance (Gi) Diarrhea   Other Other (See Comments)    08/06/2018 -    06/05/2016 -    09/15/2014 -    06/16/2014 -    Current Outpatient Medications  Medication Sig Dispense Refill   albuterol (PROVENTIL) (2.5 MG/3ML) 0.083% nebulizer solution Take 3 mLs (2.5 mg total) by nebulization every 6 (six) hours as needed for wheezing or shortness of breath. 75 mL 12   amiodarone (PACERONE) 200 MG tablet Take 1 tablet twice daily for 7 days then on 4/8  take 1 tablet once daily 60 tablet 1   apixaban (ELIQUIS) 5 MG TABS tablet Take 1 tablet (5 mg total) by mouth 2 (two) times daily. 60 tablet 1   aspirin EC 81 MG tablet Take 1 tablet (81 mg total) by mouth daily. Swallow whole. 30 tablet 12   atorvastatin (LIPITOR) 80 MG tablet Take 1 tablet (80 mg total) by mouth daily at 6 PM. 30 tablet 2   busPIRone (BUSPAR) 10 MG tablet Take 10 mg by mouth 2 (two) times daily.     Cholecalciferol (VITAMIN D) 50 MCG (2000 UT) tablet Take 2,000 Units by mouth daily.     dapagliflozin propanediol (FARXIGA) 10 MG TABS tablet Take 1 tablet (10 mg total) by mouth daily. 30 tablet 5   desloratadine (CLARINEX) 5 MG tablet Take 5 mg by mouth daily.     digoxin (LANOXIN) 0.125 MG tablet Take 1 tablet (0.125 mg total) by mouth daily. 30 tablet 1   ezetimibe (ZETIA) 10 MG tablet Take 10 mg by mouth daily.     Fe Fum-Vit C-Vit B12-FA (TRIGELS-F FORTE) CAPS capsule Take 1 capsule by mouth daily after breakfast. 30 capsule 2   FLUoxetine (PROZAC) 20 MG capsule Take 20 mg by mouth daily.     fluticasone (FLONASE) 50 MCG/ACT nasal spray Place 1 spray into both nostrils daily.     fluticasone-salmeterol (WIXELA INHUB) 500-50 MCG/ACT AEPB Inhale 1 puff into  the lungs in the morning and at bedtime. 60 each 6   furosemide (LASIX) 40 MG tablet Take 1 tablet (40 mg total) by mouth daily. 30 tablet 1   losartan (COZAAR) 25 MG tablet Take 0.5 tablets (12.5 mg total) by mouth at bedtime. 45 tablet 3   montelukast (SINGULAIR) 10 MG tablet Take 1 tablet (10 mg total) by mouth at bedtime. 30 tablet 11   pantoprazole (PROTONIX) 40 MG tablet Take 1 tablet (40 mg total) by mouth daily. 90 tablet 1   potassium chloride SA (KLOR-CON M) 20 MEQ tablet Take 2 tablets (40 mEq total) by mouth daily. 60 tablet 1   spironolactone (ALDACTONE) 25 MG tablet Take 1/2 tablet (12.5 mg total) by mouth daily. 30 tablet 3   No current facility-administered medications for this visit.     Family History  Problem Relation Age of Onset   Hypertension Mother    Hypertension Father    Colon cancer Father    Hypertension Sister        Physical Exam: Lungs: clear Card: RR without murmur Ext: no edema Neuro: no focal deficits     Diagnostic Studies & Laboratory data: I have personally reviewed the following studies and agree with the findings     Recent Radiology Findings:   CXR today is completely clear    Recent Lab Findings: Lab Results  Component Value Date   WBC 8.9 06/04/2022   HGB 12.6 06/04/2022   HCT 39.3 06/04/2022   PLT 263 06/04/2022   GLUCOSE 131 (H) 06/04/2022   CHOL 193 06/17/2019   TRIG 71 06/17/2019   HDL 71 06/17/2019   LDLCALC 109 (H) 06/17/2019   ALT 21 05/03/2022   AST 28 05/03/2022   NA 138 06/04/2022   K 4.5 06/04/2022   CL 100 06/04/2022   CREATININE 1.55 (H) 06/04/2022   BUN 17 06/04/2022   CO2 27 06/04/2022   TSH 1.952 06/04/2022   INR 1.4 (H) 05/08/2022   HGBA1C 7.1 (H) 05/03/2022      Assessment / Plan:  SP AVR/MVR with tissue valves and with cardiomyopathy. Pt doing well. We discussed restrictions on no heavy lifting for next week then gradually increase. She has not heard from cardiac rehab so we will reach  out to them to call her. She does not drive. As per her anticoagulation, she could be considered to go back to plavix and asa about 3 months from surgery and will leave to cardiology to switch when they see her back.   I have spent 30 min in review of the records, viewing studies and in face to face with patient and in coordination of future care    Eugenio Hoes 06/16/2022 10:14 AM

## 2022-06-17 ENCOUNTER — Other Ambulatory Visit (HOSPITAL_COMMUNITY): Payer: Medicare HMO

## 2022-06-17 ENCOUNTER — Ambulatory Visit (INDEPENDENT_AMBULATORY_CARE_PROVIDER_SITE_OTHER): Payer: Medicaid Other | Admitting: Thoracic Surgery (Cardiothoracic Vascular Surgery)

## 2022-06-17 ENCOUNTER — Ambulatory Visit
Admission: RE | Admit: 2022-06-17 | Discharge: 2022-06-17 | Disposition: A | Discharge status: Home / Self Care | Payer: Medicare HMO | Source: Ambulatory Visit | Attending: Thoracic Surgery (Cardiothoracic Vascular Surgery) | Admitting: Thoracic Surgery (Cardiothoracic Vascular Surgery)

## 2022-06-17 VITALS — BP 130/79 | HR 86 | Resp 20 | Ht 66.0 in | Wt 132.0 lb

## 2022-06-17 DIAGNOSIS — I340 Nonrheumatic mitral (valve) insufficiency: Secondary | ICD-10-CM

## 2022-06-17 DIAGNOSIS — I351 Nonrheumatic aortic (valve) insufficiency: Secondary | ICD-10-CM

## 2022-06-17 DIAGNOSIS — Z09 Encounter for follow-up examination after completed treatment for conditions other than malignant neoplasm: Secondary | ICD-10-CM

## 2022-06-17 DIAGNOSIS — Z952 Presence of prosthetic heart valve: Secondary | ICD-10-CM

## 2022-06-17 NOTE — Patient Instructions (Signed)
Follow up prn Follow up with Cardiology to discuss anticoagulation Cardiac rehab

## 2022-06-18 ENCOUNTER — Other Ambulatory Visit: Payer: Self-pay | Admitting: *Deleted

## 2022-06-18 NOTE — Progress Notes (Signed)
Ambulatory referral placed to [facility] per Dr. Weldner.  

## 2022-06-21 ENCOUNTER — Telehealth (HOSPITAL_COMMUNITY): Payer: Self-pay

## 2022-06-21 ENCOUNTER — Encounter (HOSPITAL_COMMUNITY): Payer: Self-pay

## 2022-06-21 ENCOUNTER — Other Ambulatory Visit (HOSPITAL_COMMUNITY): Payer: Self-pay

## 2022-06-21 NOTE — Telephone Encounter (Signed)
Attempted to call patient in regards to Cardiac Rehab - LM on VM Mailed letter 

## 2022-06-28 ENCOUNTER — Other Ambulatory Visit (HOSPITAL_COMMUNITY): Payer: Self-pay

## 2022-06-28 ENCOUNTER — Other Ambulatory Visit: Payer: Self-pay | Admitting: Surgical

## 2022-07-01 ENCOUNTER — Ambulatory Visit (HOSPITAL_COMMUNITY)
Admission: RE | Admit: 2022-07-01 | Discharge: 2022-07-01 | Disposition: A | Discharge status: Home / Self Care | Payer: Medicare HMO | Source: Ambulatory Visit | Attending: Cardiology | Admitting: Cardiology

## 2022-07-01 VITALS — BP 126/84 | HR 83 | Wt 131.4 lb

## 2022-07-01 DIAGNOSIS — R0600 Dyspnea, unspecified: Secondary | ICD-10-CM | POA: Insufficient documentation

## 2022-07-01 DIAGNOSIS — Z8673 Personal history of transient ischemic attack (TIA), and cerebral infarction without residual deficits: Secondary | ICD-10-CM | POA: Diagnosis not present

## 2022-07-01 DIAGNOSIS — Z7982 Long term (current) use of aspirin: Secondary | ICD-10-CM | POA: Diagnosis not present

## 2022-07-01 DIAGNOSIS — Z79899 Other long term (current) drug therapy: Secondary | ICD-10-CM | POA: Insufficient documentation

## 2022-07-01 DIAGNOSIS — Z7901 Long term (current) use of anticoagulants: Secondary | ICD-10-CM | POA: Insufficient documentation

## 2022-07-01 DIAGNOSIS — Z87891 Personal history of nicotine dependence: Secondary | ICD-10-CM | POA: Diagnosis not present

## 2022-07-01 DIAGNOSIS — I5022 Chronic systolic (congestive) heart failure: Secondary | ICD-10-CM

## 2022-07-01 DIAGNOSIS — I081 Rheumatic disorders of both mitral and tricuspid valves: Secondary | ICD-10-CM | POA: Insufficient documentation

## 2022-07-01 DIAGNOSIS — I272 Pulmonary hypertension, unspecified: Secondary | ICD-10-CM | POA: Diagnosis not present

## 2022-07-01 DIAGNOSIS — R9431 Abnormal electrocardiogram [ECG] [EKG]: Secondary | ICD-10-CM | POA: Diagnosis not present

## 2022-07-01 DIAGNOSIS — I5043 Acute on chronic combined systolic (congestive) and diastolic (congestive) heart failure: Secondary | ICD-10-CM

## 2022-07-01 DIAGNOSIS — Z7984 Long term (current) use of oral hypoglycemic drugs: Secondary | ICD-10-CM | POA: Diagnosis not present

## 2022-07-01 DIAGNOSIS — N1831 Chronic kidney disease, stage 3a: Secondary | ICD-10-CM | POA: Insufficient documentation

## 2022-07-01 DIAGNOSIS — R42 Dizziness and giddiness: Secondary | ICD-10-CM | POA: Diagnosis present

## 2022-07-01 DIAGNOSIS — I130 Hypertensive heart and chronic kidney disease with heart failure and stage 1 through stage 4 chronic kidney disease, or unspecified chronic kidney disease: Secondary | ICD-10-CM | POA: Insufficient documentation

## 2022-07-01 LAB — BASIC METABOLIC PANEL
Anion gap: 11 (ref 5–15)
BUN: 18 mg/dL (ref 8–23)
CO2: 23 mmol/L (ref 22–32)
Calcium: 9.3 mg/dL (ref 8.9–10.3)
Chloride: 105 mmol/L (ref 98–111)
Creatinine, Ser: 1.55 mg/dL — ABNORMAL HIGH (ref 0.44–1.00)
GFR, Estimated: 35 mL/min — ABNORMAL LOW (ref >60)
Glucose, Bld: 94 mg/dL (ref 70–99)
Potassium: 4.2 mmol/L (ref 3.5–5.1)
Sodium: 139 mmol/L (ref 135–145)

## 2022-07-01 LAB — BRAIN NATRIURETIC PEPTIDE: B Natriuretic Peptide: 95.3 pg/mL (ref 0.0–100.0)

## 2022-07-01 NOTE — Progress Notes (Signed)
Advanced Heart Failure Clinic Note    Primary Care: Donato Schultz, FNP HF Cardiologist: Dr. Shirlee Latch  HPI:  72 y.o. female w/ h/o chronic diastolic heart failure, HTN, HLD, h/o TIA, GERD and asthma. Former smoker (quit 72 y/o). Noted to have high coronary calcium score of 507 in March 2022. Referred to lipid clinic for aggressive tx of HLD but did not undergo cath in absence of symptoms. Echo 04/2018 showed normal LVEF and RV. No significant valvular dysfunction.    Referred back to cardiology for surgical clearance prior to undergoing planned EGD and EBUS (ordered given progressive dysphagia, wt loss and abnormal chest CT). She endorsed exertional dyspnea w/ inability to complete of activity w/o symptoms. Echo was done and showed drop in EF down to 40-45% w/ diffuse HK, severe LAE mod-severe MR, severely elevated RVSP, RV normal, mod-severe RAE and mod-severe TR. Subsequently referred for [facility].    R/LHC on 02/2022 showed no significant CAD, only 10% pRCA narrowing, elevated R+ L heart filling pressures and marginal output, (PA saturation 48%, mean RA pressure 18 mmHg, PA pressure 50/28, mean PA pressure 37 mmHg, pulmonary capillary wedge pressure 35/36, mean pulmonary capillary wedge pressure 30 mmHg, cardiac output 3.92 L/min, cardiac index 2.27).    She was direct admitted from the cath lab for IV diuresis. TEE showed EF 30-35%, mild RV dysfunction, severe MR, moderate TR, moderate AI.  Cardiac MRI showed LV EF 18%, RV EF 25%, severe AI, moderate-severe MR (regurgitant fraction not likely accurate with low SV), non-coronary mid-wall LGE in the basal septal wall. 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  She had dysphagia and underwent EGD/colonoscopy showing esophageal candidiasis, unremarkable colonoscopy. Hospitalization also complicated by episode of SVT requiring adenosine. Her GDMT was titrated, but limited by soft BP. Structural  heart team was to review TEE to decide on mTEER candidacy. She was discharged home, weight 134 lbs.   Admitted 04/2022 for scheduled MVR/AVR for severe MR, moderate AI and TR. S/p successful MVR/AVR. AHF consulted for management of HF. Post op echo showed EF 30-35%, RV mod reduced, stable AV and MV prosthesis. After pressors/inotropes weaned off, GDMT titrated but limited by soft BPs. Had accelerated junction rhythm vs 1AVB after surgery and started on amiodarone. She was discharged home, weight 137.5 lbs.   On 06/04/2022 she returned for HF follow up. Patient was overall feeling fine. She had occasional dizziness, more so with ambulation but no falls. Pain was well-controlled. She was not short of breath with walking on flat ground or with ADLs. Denied palpitations, abnormal bleeding, CP,  edema, or PND/Orthopnea. Appetite was poor. No fever or chills reported. Weight at home was 129 pounds. Patient was taking all medications.   Today she returns to HF clinic for pharmacist medication titration. At last visit with APP 06/04/2022 patient was started on losartan 12.5 mg at bedtime. Now, patient is overall feeling well. Patient reported some dizziness/lightheadedness, commonly when she would stand up too quickly. Reports limited fatigue, only after prolonged activity. No chest pain or palpitations. Patient reported mild dyspnea with more rigorous activity, but no SOB when walking on flat ground. SOB worsened after an acute 'cold.' Able to complete all ADLs. Weight at home is normally 129 lbs, but has recently increased up to low 130's. Patient takes furosemide 40 mg once daily - of note, she did not decrease Lasix to 20 mg daily as instructed after labs returned last visit. No extra doses have  been taken. No LEE, PND, or orthopnea. Patient's appetite is good and has improved recently after her surgery. Patient is following a low-salt diet.  HF Medications: Losartan 12.5 mg qhs Spironolactone 12.5 mg  daily Farxiga 10 mg daily Digoxin 0.125 mg daily Lasix 40 mg daily  Has the patient been experiencing any side effects to the medications prescribed?  No  Does the patient have any problems obtaining medications due to transportation or finances?   No, [employer] Medicare  Understanding of regimen: good Understanding of indications: good Potential of compliance: good Patient understands to avoid NSAIDs. Patient understands to avoid decongestants.    Pertinent Lab Values: 06/04/2022: Serum creatinine 1.55, BUN 17, Potassium 4.5, Sodium 138, BNP 82.7, Magnesium 1.9. 07/01/2022: Serum creatinine 1.55, BUN 18, Potassium 4.2, Sodium 139, BNP 95.3  Vital Signs: Weight: 131.4 lbs (last clinic weight: 132 lbs) Blood pressure: 126/84 mmHg Heart rate: 83   Assessment/Plan: 1. Chronic systolic CHF: Echo in 04/2018 showed low normal EF 50-55%, no significant MR. Echo in 02/2022 EF 40-45%, mild RV dilation, moderate pulmonary HTN, mod-severe MR and moderate-severe TR. RHC/LHC (02/2022) with elevated left and right heart filling pressures, pulmonary venous hypertension, preserved cardiac output.  NICM. Cause is uncertain, valvular disease. TEE done 03/01/2022 showed EF 30-35%, mild RV dysfunction, severe MR, moderate TR, moderate AI.  Cardiac MRI 02/2010 showed LV EF 18%, RV EF 25%, severe AI, moderate-severe MR (regurgitant fraction not likely accurate with low SV), non-coronary mid-wall LGE in the basal septal wall.  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 Post-op Echo (04/2022) showed EF 30-35%, RV mod reduced.  - Dry on exam today with NYHA class II symptoms. - Reduce Lasix to 20 mg daily with creatinine still elevated, normal BNP, and mild orthostasis - Hold off on beta blocker due to prolonged AVB. - Continue losartan 12.5 mg at bedtime. - Continue spirolactone 12.5 mg at bedtime. - Continue Farxiga 10 mg daily. - Continue digoxin 0.125 mg daily.  Unable to obtain digoxin level today with dose given 4 hours prior. Patient educated not to take digoxin prior to next appointment to obtain trough. - Repeat echo in 2-3 months. 2. Valvular heart disease: TEE showed severe MR.  The valve is abnormal with thickening of the leaflet tips and poor coapatation, underlying rheumatic or inflammatory etiology though not classic appearance for rheumatic heart disease and no mitral stenosis. Difficult to quantify mitral regurgitation by MRI due to low stroke volume (do not think regurgitant fraction is accurate). The aortic valve is also abnormal with thickening and doming.  Aortic insufficiency was moderate by TEE though regurgitant volume by cMRI suggested severe AI. The structural heart team reviewed TEE images and felt she is not suitable for mTEER, so she underwent MVR/AVR 05/07/2022 - Post-op TEE: EF was 30-40% and the valves were well seated and the mitral had a mean of and the aortic had a mean of  - Post-op Echo EF 30-35%, RV mod reduced, prosthetic valves ok  - Will need SBE prophylaxis. - Continue Eliquis 5 mg twice daily through 08/07/22. Afterwards, Cardiology to consider changing from Eliquis to Plavix given history of TIA with surgical LAA isolation and history of GI bleeding. No bleeding issues today. - Continue ASA per CVTS. - CR when OK with CVTS 3. Pulmonary hypertension: Group 2 pulmonary venous hypertension.  -Continue current management as above 4. H/o TIA/CVA: Previously on Plavix long term. - AC per TCTS 5. CKD 3a: Baseline SCr  1. - Continue Farxiga. 6. 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 Started on amiodarone. - NSR with long 1AVB on 06/04/2022 on ECG. Avoid beta blocker for now. - Continue amiodarone 200 mg daily. - Continue digoxin.  7. Prolonged QTc: ECG 05/14/2022 inpatient showed QTc 531 msec, but QT interval artifactually prologed due to P waves embedded in T waves. - Continue amiodarone  200 mg daily. - Continue Prozac 20 mg QOD. May need to switch to sertraline.  Follow up with APP on 07/16/2022  Karle Plumber, PharmD, BCPS, BCCP, CPP Heart Failure Clinic Pharmacist [phone]

## 2022-07-01 NOTE — Patient Instructions (Signed)
It was a pleasure seeing you today!  MEDICATIONS: -We will obtain labs today. Based on the results, we may adjust your medications.  LABS: -We will call you if your labs need attention.  NEXT APPOINTMENT: Return to clinic on 07/16/22 for follow-up.  In general, to take care of your heart failure: -Limit your fluid intake to 2 Liters (half-gallon) per day.   -Limit your salt intake to ideally 2-3 grams (2000-3000 mg) per day. -Weigh yourself daily and record, and bring that "weight diary" to your next appointment.  (Weight gain of 2-3 pounds in 1 day typically means fluid weight.) -The medications for your heart are to help your heart and help you live longer.   -Please contact us before stopping any of your heart medications.  Call the clinic at [phone] with questions or to reschedule future appointments.

## 2022-07-08 ENCOUNTER — Ambulatory Visit (HOSPITAL_COMMUNITY)
Admission: RE | Admit: 2022-07-08 | Discharge: 2022-07-08 | Disposition: A | Discharge status: Home / Self Care | Payer: Medicare HMO | Source: Ambulatory Visit | Attending: Cardiology | Admitting: Cardiology

## 2022-07-08 DIAGNOSIS — I5043 Acute on chronic combined systolic (congestive) and diastolic (congestive) heart failure: Secondary | ICD-10-CM | POA: Diagnosis present

## 2022-07-08 LAB — BASIC METABOLIC PANEL
Anion gap: 9 (ref 5–15)
BUN: 15 mg/dL (ref 8–23)
CO2: 23 mmol/L (ref 22–32)
Calcium: 9.4 mg/dL (ref 8.9–10.3)
Chloride: 108 mmol/L (ref 98–111)
Creatinine, Ser: 1.22 mg/dL — ABNORMAL HIGH (ref 0.44–1.00)
GFR, Estimated: 47 mL/min — ABNORMAL LOW (ref >60)
Glucose, Bld: 104 mg/dL — ABNORMAL HIGH (ref 70–99)
Potassium: 4.2 mmol/L (ref 3.5–5.1)
Sodium: 140 mmol/L (ref 135–145)

## 2022-07-16 ENCOUNTER — Encounter (HOSPITAL_COMMUNITY): Payer: Self-pay

## 2022-07-16 ENCOUNTER — Ambulatory Visit (HOSPITAL_COMMUNITY)
Admission: RE | Admit: 2022-07-16 | Discharge: 2022-07-16 | Disposition: A | Discharge status: Home / Self Care | Payer: Medicare HMO | Source: Ambulatory Visit | Attending: Internal Medicine | Admitting: Internal Medicine

## 2022-07-16 VITALS — BP 130/72 | HR 83 | Wt 135.0 lb

## 2022-07-16 DIAGNOSIS — I509 Heart failure, unspecified: Secondary | ICD-10-CM

## 2022-07-16 DIAGNOSIS — I2722 Pulmonary hypertension due to left heart disease: Secondary | ICD-10-CM | POA: Diagnosis not present

## 2022-07-16 DIAGNOSIS — Z87891 Personal history of nicotine dependence: Secondary | ICD-10-CM | POA: Insufficient documentation

## 2022-07-16 DIAGNOSIS — Z79899 Other long term (current) drug therapy: Secondary | ICD-10-CM | POA: Diagnosis not present

## 2022-07-16 DIAGNOSIS — I428 Other cardiomyopathies: Secondary | ICD-10-CM | POA: Diagnosis not present

## 2022-07-16 DIAGNOSIS — G459 Transient cerebral ischemic attack, unspecified: Secondary | ICD-10-CM | POA: Diagnosis not present

## 2022-07-16 DIAGNOSIS — Z7901 Long term (current) use of anticoagulants: Secondary | ICD-10-CM | POA: Insufficient documentation

## 2022-07-16 DIAGNOSIS — I130 Hypertensive heart and chronic kidney disease with heart failure and stage 1 through stage 4 chronic kidney disease, or unspecified chronic kidney disease: Secondary | ICD-10-CM | POA: Insufficient documentation

## 2022-07-16 DIAGNOSIS — Z8249 Family history of ischemic heart disease and other diseases of the circulatory system: Secondary | ICD-10-CM | POA: Diagnosis not present

## 2022-07-16 DIAGNOSIS — I38 Endocarditis, valve unspecified: Secondary | ICD-10-CM

## 2022-07-16 DIAGNOSIS — N1831 Chronic kidney disease, stage 3a: Secondary | ICD-10-CM

## 2022-07-16 DIAGNOSIS — E785 Hyperlipidemia, unspecified: Secondary | ICD-10-CM | POA: Diagnosis not present

## 2022-07-16 DIAGNOSIS — I498 Other specified cardiac arrhythmias: Secondary | ICD-10-CM

## 2022-07-16 DIAGNOSIS — I5022 Chronic systolic (congestive) heart failure: Secondary | ICD-10-CM | POA: Diagnosis not present

## 2022-07-16 DIAGNOSIS — I272 Pulmonary hypertension, unspecified: Secondary | ICD-10-CM | POA: Diagnosis not present

## 2022-07-16 DIAGNOSIS — R9431 Abnormal electrocardiogram [ECG] [EKG]: Secondary | ICD-10-CM

## 2022-07-16 DIAGNOSIS — Z8673 Personal history of transient ischemic attack (TIA), and cerebral infarction without residual deficits: Secondary | ICD-10-CM | POA: Insufficient documentation

## 2022-07-16 DIAGNOSIS — Z952 Presence of prosthetic heart valve: Secondary | ICD-10-CM | POA: Insufficient documentation

## 2022-07-16 LAB — BASIC METABOLIC PANEL
Anion gap: 8 (ref 5–15)
BUN: 16 mg/dL (ref 8–23)
CO2: 23 mmol/L (ref 22–32)
Calcium: 9.1 mg/dL (ref 8.9–10.3)
Chloride: 104 mmol/L (ref 98–111)
Creatinine, Ser: 1.5 mg/dL — ABNORMAL HIGH (ref 0.44–1.00)
GFR, Estimated: 37 mL/min — ABNORMAL LOW (ref >60)
Glucose, Bld: 108 mg/dL — ABNORMAL HIGH (ref 70–99)
Potassium: 4.3 mmol/L (ref 3.5–5.1)
Sodium: 135 mmol/L (ref 135–145)

## 2022-07-16 LAB — CBC
HCT: 38.9 % (ref 36.0–46.0)
Hemoglobin: 12 g/dL (ref 12.0–15.0)
MCH: 28.4 pg (ref 26.0–34.0)
MCHC: 30.8 g/dL (ref 30.0–36.0)
MCV: 92.2 fL (ref 80.0–100.0)
Platelets: 277 10*3/uL (ref 150–400)
RBC: 4.22 MIL/uL (ref 3.87–5.11)
RDW: 15.7 % — ABNORMAL HIGH (ref 11.5–15.5)
WBC: 9.1 10*3/uL (ref 4.0–10.5)
nRBC: 0 % (ref 0.0–0.2)

## 2022-07-16 MED ORDER — LOSARTAN POTASSIUM 25 MG PO TABS: 25.0000 mg | ORAL_TABLET | Freq: Every evening | ORAL | 3 refills | Status: DC

## 2022-07-16 NOTE — Patient Instructions (Addendum)
Thank you for coming in today  If you had labs drawn today, any labs that are abnormal the clinic will call you No news is good news  Medications: Increase Losartan to 25 mg 1 tablet nightly  Follow up appointments: Your physician recommends that you return for lab work in: 1 week BMET  Your physician recommends that you schedule a follow-up appointment in:  2 months With Dr. Shirlee Latch  Your physician has requested that you have an echocardiogram. Echocardiography is a painless test that uses sound waves to create images of your heart. It provides your doctor with information about the size and shape of your heart and how well your heart's chambers and valves are working. This procedure takes approximately one hour. There are no restrictions for this procedure.      Do the following things EVERYDAY: Weigh yourself in the morning before breakfast. Write it down and keep it in a log. Take your medicines as prescribed Eat low salt foods--Limit salt (sodium) to 2000 mg per day.  Stay as active as you can everyday Limit all fluids for the day to less than 2 liters   At the Advanced Heart Failure Clinic, you and your health needs are our priority. As part of our continuing mission to provide you with exceptional heart care, we have created designated Provider Care Teams. These Care Teams include your primary Cardiologist (physician) and Advanced Practice Providers (APPs- Physician Assistants and Nurse Practitioners) who all work together to provide you with the care you need, when you need it.   You may see any of the following providers on your designated Care Team at your next follow up: Dr Arvilla Meres Dr Marca Ancona Dr. Marcos Eke, NP Robbie Lis, [place] [facility] [place], [place] Brynda Peon, NP Karle Plumber, PharmD   Please be sure to bring in all your medications bottles to every appointment.    Thank you for choosing [facility]  If you have any questions or concerns before your next appointment please send [place] a message through [place] or call our office at [phone].    TO LEAVE A MESSAGE FOR THE NURSE SELECT OPTION 2, PLEASE LEAVE A MESSAGE INCLUDING: YOUR NAME DATE OF BIRTH CALL BACK NUMBER REASON FOR CALL**this is important as we prioritize the call backs  YOU WILL RECEIVE A CALL BACK THE SAME DAY AS LONG AS YOU CALL BEFORE 4:00 PM

## 2022-07-16 NOTE — Progress Notes (Signed)
ADVANCED HF CLINIC NOTE   Primary Care: Donato Schultz, FNP HF Cardiologist: Dr. Shirlee Latch  HPI: 72 y.o. female w/ h/o chronic diastolic heart failure, HTN, HLD, h/o TIA, GERD and asthma. Former smoker (quit 72 y/o). Noted to have high coronary calcium score of 507 in March 2022. Referred to lipid clinic for aggressive tx of HLD but did not undergo cath in absence of symptoms. Echo 04/2018 showed normal LVEF and RV. No significant valvular dysfunction.    Recently referred back to cardiology for surgical clearance prior to undergoing planned EGD and EBUS (ordered given progressive dysphagia, wt loss and abnormal chest CT). She endorsed exertional dyspnea w/ inability to complete of activity w/o symptoms. Echo was done and showed drop in EF down to 40-45% w/ diffuse HK, severe LAE mod-severe MR, severely elevated RVSP, RV normal, mod-severe RAE and mod-severe TR. Subsequently referred for [facility].    [facility] 1/24 showed no significant CAD, only 10% pRCA narrowing, elevated R+ L heart filling pressures and marginal output, (PA saturation 48%, mean RA pressure 18 mmHg, PA pressure 50/28, mean PA pressure 37 mmHg, pulmonary capillary wedge pressure 35/36, mean pulmonary capillary wedge pressure 30 mmHg, cardiac output 3.92 L/min, cardiac index 2.27).    She was direct admitted from the cath lab for IV diuresis. TEE showed EF 30-35%, mild RV dysfunction, severe MR, moderate TR, moderate AI.  Cardiac MRI showed LV EF 18%, RV EF 25%, ?severe AI, moderate-severe MR (regurgitant fraction not likely accurate with low SV), non-coronary mid-wall LGE in the basal septal wall.  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  She had dysphagia and underwent EGD/colonoscopy showing esophageal candidiasis, unremarkable colonoscopy. Hospitalization also complicated by episode of SVT requiring adenosine. Her GDMT was titrated, but limited by soft BP. Structural Heart  team to review TEE to decide on mTEER candidacy. She was discharged home, weight 134 lbs.  Admitted 3/24 for scheduled MVR/AVR for severe MR, moderate AI and TR. S/p successful MVR/AVR. AHF consulted for management of HF. Post op echo showed EF 30-35%, RV mod reduced, stable AV and MV prosthesis. After pressors/inotropes weaned off, GDMT titrated but limited by soft BPs. Had accelerated junction rhythm vs 1AVB after surgery and started on amiodarone. She was discharged home, weight 137.5 lbs.  Today she returns for AHF follow up. Overall feeling good. Denies palpitations, dizziness, edema, or PND/Orthopnea. Feels chest pain, sharp/pressure when she lays on her R side, has been happening for about 2-3 weeks. Got dizzy and nauseous this weekend while being out in the heat during a wedding. No SOB. Appetite good. No fever or chills. Weight at home 131-133 pounds. Taking all medications. Drinks 2 beers a week. Does not smoking. Does not take her BP at home.   ECG (personally reviewed): NSR 1st degree AVB, LVH 78 bpm  Labs (1/24): K 4.4, creatinine 1.42, AST 85, ALT 73 Labs (1/24): K 3.6, creatinine 2.03 Labs (4/24): K 3.3, creatinine 1.02, Mg 1.9, TSH 1.9 Labs (5/24): K 4.2, Scr 1.22,   PMH: 1. 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  2. COPD: Prior smoker.  3. Mediastinal lymphadenopathy: PET-CT with hypermetabolic lymphadenopathy, ?lymphoproliferative disorder vs primary bronchogenic carcinoma vs sarcoidosis.  4. CKD stage 3 5. SVT: Occurred in 1/24, required adenosine.  6. Chronic systolic CHF:  Nonischemic cardiomyopathy.  - Echo (3/20): EF 50-55%, RV normal, mild MR - Echo (1/24): EF 40-45%, Severe LAE, mod-severe MR, severely elevated RVSP, RV normal, mod-severe RAE, mod-severe TR  -  TEE (1/24): EF 30-35%, severe MR, moderate TR, moderate AI - Cardiac MRI (1/24): LVEF 18%, RVEF 25%, moderate AI visually but regurgitant fraction 49% suggests severe AI, moderate-severe MR,  non-coronary mid-wall LGE  in the basal septal wall, cannot rule out cardiac sarcoidosis (unexplained mediastinal LAN) - R/LHC (1/24): Mild nonobstructive CAD; RA mean 18, PA 50/28 (mean 37), PCWP 30, CO/CI: 3.92/ 2.27 - Post Op echo (3/24): EF 30-35%, RV moderately reduced 7. Valvular heart disease:  TEE in 1/24 showed severe MR.  The valve is abnormal with thickening of the leaflet tips and poor coapatation, ?underlying rheumatic or inflammatory etiology though not classic appearance for rheumatic heart disease and no mitral stenosis. Difficult to quantify mitral regurgitation by MRI due to low stroke volume (do not think regurgitant fraction is accurate). The aortic valve is also abnormal with thickening and doming.  Aortic insufficiency was moderate by TEE though regurgitant volume by cMRI suggested severe AI (I still suspect truly in moderate range).  - s/p MVR and AVR 3/24 - Post op echo showed EF 30-35%, RV mod reduced, stable AV and MV prosthesis. 8. GERD  Review of Systems: All systems reviewed and negative except as per HPI.   Current Outpatient Medications  Medication Sig Dispense Refill   albuterol (PROVENTIL) (2.5 MG/3ML) 0.083% nebulizer solution Take 3 mLs (2.5 mg total) by nebulization every 6 (six) hours as needed for wheezing or shortness of breath. 75 mL 12   amiodarone (PACERONE) 200 MG tablet Take 200 mg by mouth daily.     apixaban (ELIQUIS) 5 MG TABS tablet Take 1 tablet (5 mg total) by mouth 2 (two) times daily. 60 tablet 1   aspirin EC 81 MG tablet Take 1 tablet (81 mg total) by mouth daily. Swallow whole. 30 tablet 12   atorvastatin (LIPITOR) 80 MG tablet Take 1 tablet (80 mg total) by mouth daily at 6 PM. 30 tablet 2   busPIRone (BUSPAR) 10 MG tablet Take 10 mg by mouth 2 (two) times daily.     Cholecalciferol (VITAMIN D) 50 MCG (2000 UT) tablet Take 2,000 Units by mouth daily.     dapagliflozin propanediol (FARXIGA) 10 MG TABS tablet Take 1 tablet (10 mg total) by mouth daily. 30 tablet 5    desloratadine (CLARINEX) 5 MG tablet Take 5 mg by mouth daily.     digoxin (LANOXIN) 0.125 MG tablet Take 1 tablet (0.125 mg total) by mouth daily. 30 tablet 1   ezetimibe (ZETIA) 10 MG tablet Take 10 mg by mouth daily.     FLUoxetine (PROZAC) 20 MG capsule Take 20 mg by mouth daily.     fluticasone (FLONASE) 50 MCG/ACT nasal spray Place 1 spray into both nostrils daily.     fluticasone-salmeterol (WIXELA INHUB) 500-50 MCG/ACT AEPB Inhale 1 puff into the lungs in the morning and at bedtime. 60 each 6   furosemide (LASIX) 40 MG tablet Take 1 tablet (40 mg total) by mouth daily. 30 tablet 1   losartan (COZAAR) 25 MG tablet Take 0.5 tablets (12.5 mg total) by mouth at bedtime. 45 tablet 3   montelukast (SINGULAIR) 10 MG tablet Take 1 tablet (10 mg total) by mouth at bedtime. 30 tablet 11   pantoprazole (PROTONIX) 40 MG tablet Take 1 tablet (40 mg total) by mouth daily. 90 tablet 1   Potassium Chloride (KLOR-CON PO) Take 20 mEq by mouth daily.     spironolactone (ALDACTONE) 25 MG tablet Take 1/2 tablet (12.5 mg total) by mouth daily. 30  tablet 3   No current facility-administered medications for this encounter.   Allergies  Allergen Reactions   Amoxicillin-Pot Clavulanate Itching   Lactose Intolerance (Gi) Diarrhea   Other Other (See Comments)    08/06/2018 -    06/05/2016 -    09/15/2014 -    06/16/2014 -   Social History   Socioeconomic History   Marital status: Widowed    Spouse name: Not on file   Number of children: Not on file   Years of education: Not on file   Highest education level: Not on file  Occupational History   Not on file  Tobacco Use   Smoking status: Former    Packs/day: 0.50    Years: 30.00    Additional pack years: 0.00    Total pack years: 15.00    Types: Cigarettes    Quit date: 11/10/1998    Years since quitting: 23.6   Smokeless tobacco: Never  Vaping Use   Vaping Use: Never used  Substance and Sexual Activity   Alcohol use: Yes    Comment:  occasional   Drug use: No   Sexual activity: Not Currently  Other Topics Concern   Not on file  Social History Narrative   Not on file   Social Determinants of Health   Financial Resource Strain: Not on file  Food Insecurity: No Food Insecurity (05/09/2022)   Hunger Vital Sign    Worried About Running Out of Food in the Last Year: Never true    Ran Out of Food in the Last Year: Never true  Transportation Needs: No Transportation Needs (05/09/2022)   PRAPARE - Administrator, Civil Service (Medical): No    Lack of Transportation (Non-Medical): No  Physical Activity: Not on file  Stress: Not on file  Social Connections: Not on file  Intimate Partner Violence: Not At Risk (05/09/2022)   Humiliation, Afraid, Rape, and Kick questionnaire    Fear of Current or Ex-Partner: No    Emotionally Abused: No    Physically Abused: No    Sexually Abused: No   Family History  Problem Relation Age of Onset   Hypertension Mother    Hypertension Father    Colon cancer Father    Hypertension Sister    BP 130/72   Pulse 83   Wt 61.2 kg (135 lb)   SpO2 96%   BMI 21.79 kg/m   Wt Readings from Last 3 Encounters:  07/16/22 61.2 kg (135 lb)  07/01/22 59.6 kg (131 lb 6.4 oz)  06/17/22 59.9 kg (132 lb)   PHYSICAL EXAM: General:  well appearing.  No respiratory difficulty. Walked into clinic.  HEENT: normal Neck: supple. JVD ~7 cm. Carotids 2+ bilat; no bruits. No lymphadenopathy or thyromegaly appreciated. Cor: PMI nondisplaced. Regular rate & rhythm. No rubs, gallops or murmurs. Lungs: clear Abdomen: soft, nontender, nondistended. No hepatosplenomegaly. No bruits or masses. Good bowel sounds. Extremities: no cyanosis, clubbing, rash, edema  Neuro: alert & oriented x 3, cranial nerves grossly intact. moves all 4 extremities w/o difficulty. Affect pleasant.   ASSESSMENT & PLAN: 1. Valvular heart disease: TEE showed severe MR.  The valve is abnormal with thickening of the leaflet  tips and poor coapatation, ?underlying rheumatic or inflammatory etiology though not classic appearance for rheumatic heart disease and no mitral stenosis. Difficult to quantify mitral regurgitation by MRI due to low stroke volume (do not think regurgitant fraction is accurate). The aortic valve is also abnormal with thickening and doming.  Aortic insufficiency was moderate by TEE though regurgitant volume by cMRI suggested severe AI (I still suspect truly in moderate range). The structural heart team reviewed TEE images and felt she is not suitable for mTEER, so she underwent MVR/AVR 05/07/22 - post-op TEE: EF was 30-40% and the valves were well seated and the mitral had a mean of and the aortic had a mean of  - post-op Echo EF 30-35%, RV mod reduced, prosthetic valves ok  - Will need SBE prophylaxis. - Continue Eliquis 5 mg bid. No bleeding issues. CBC today. - Continue ASA per CVTS. - CR when OK with CVTS 2. Chronic Systolic CHF: Echo in 3/20 showed low normal EF 50-55%, no significant MR. Echo in 1/24 EF 40-45%, mild RV dilation, moderate pulmonary HTN, mod-severe MR and moderate-severe TR. RHC/LHC (1/24) with elevated left and right heart filling pressures, pulmonary venous hypertension, preserved cardiac output.  NICM. Cause is uncertain, ? valvular disease. TEE done 03/01/22 showed EF 30-35%, mild RV dysfunction, severe MR, moderate TR, moderate AI.  Cardiac MRI 1/12 showed LV EF 18%, RV EF 25%, ? severe AI, moderate-severe MR (regurgitant fraction not likely accurate with low SV), non-coronary mid-wall LGE in the basal septal wall.  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 Post-op Echo (3/24) showed EF 30-35%, RV mod reduced. NYHA II symptoms, she is not volume overloaded today. - Increase losartan 12.5>25 mg qhs (historically had low BPs on Entresto). - Continue Farxiga 10 mg daily. - Continue digoxin 0.125 mg daily. Check dig trough  next visit.  - Continue spiro 12.5 mg qhs.  - Continue Lasix 20 mg daily. - Hold off on beta blocker with prolonged 1AVB. - Repeat echo in 3 months. - BMET today, repeat in 10 days 3. Pulmonary hypertension: Group 2 pulmonary venous hypertension.  4. H/o TIA/CVA: Previously on Plavix long term. - AC per TCTS 5. CKD 3a: Baseline SCr 1. - Continue Farxiga.  6. 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 Started on amiodarone. - NSR with long 1AVB today on ECG. Avoid beta blocker for now. - Continue amiodarone 200 mg daily. - Continue digoxin.  7. Prolonged QTc: ECG 05/14/22 inpatient showed QTc 531 msec, but QT interval artifactually prologed due to P waves embedded in T waves. ECG today showed QTc 556, long 1AVB. - She just reduced her amiodarone to 200 mg once a day, will continue for now. - Continue Prozac 20 mg QOD. May need to switch to sertraline.  Follow up in 2 months with Dr. Babette Relic, AGACNP-BC  07/16/22

## 2022-07-17 ENCOUNTER — Telehealth (HOSPITAL_COMMUNITY): Payer: Self-pay

## 2022-07-17 NOTE — Telephone Encounter (Signed)
-----   Message from Alen Bleacher, NP sent at 07/16/2022  4:28 PM EDT ----- Renal function slightly elevated. Change lasix to every other day. Ok to take extra if weight goes up or edema.

## 2022-07-17 NOTE — Telephone Encounter (Signed)
Patient aware of labs and medication change.

## 2022-07-23 ENCOUNTER — Other Ambulatory Visit: Payer: Self-pay | Admitting: Surgical

## 2022-07-23 ENCOUNTER — Other Ambulatory Visit (HOSPITAL_COMMUNITY): Payer: Medicare HMO

## 2022-07-23 ENCOUNTER — Other Ambulatory Visit: Payer: Self-pay | Admitting: Nurse Practitioner

## 2022-07-29 ENCOUNTER — Ambulatory Visit (HOSPITAL_COMMUNITY)
Admission: RE | Admit: 2022-07-29 | Discharge: 2022-07-29 | Disposition: A | Discharge status: Home / Self Care | Payer: Medicare HMO | Source: Ambulatory Visit | Attending: Cardiology | Admitting: Cardiology

## 2022-07-29 DIAGNOSIS — I5022 Chronic systolic (congestive) heart failure: Secondary | ICD-10-CM | POA: Diagnosis not present

## 2022-07-29 LAB — BASIC METABOLIC PANEL
Anion gap: 8 (ref 5–15)
BUN: 21 mg/dL (ref 8–23)
CO2: 22 mmol/L (ref 22–32)
Calcium: 9.2 mg/dL (ref 8.9–10.3)
Chloride: 108 mmol/L (ref 98–111)
Creatinine, Ser: 1.35 mg/dL — ABNORMAL HIGH (ref 0.44–1.00)
GFR, Estimated: 41 mL/min — ABNORMAL LOW (ref >60)
Glucose, Bld: 96 mg/dL (ref 70–99)
Potassium: 3.5 mmol/L (ref 3.5–5.1)
Sodium: 138 mmol/L (ref 135–145)

## 2022-07-31 ENCOUNTER — Ambulatory Visit (HOSPITAL_COMMUNITY)
Admission: RE | Admit: 2022-07-31 | Discharge: 2022-07-31 | Disposition: A | Discharge status: Home / Self Care | Payer: Medicare HMO | Source: Ambulatory Visit | Attending: Family Medicine | Admitting: Family Medicine

## 2022-07-31 DIAGNOSIS — I5022 Chronic systolic (congestive) heart failure: Secondary | ICD-10-CM | POA: Diagnosis not present

## 2022-07-31 DIAGNOSIS — J449 Chronic obstructive pulmonary disease, unspecified: Secondary | ICD-10-CM | POA: Diagnosis not present

## 2022-07-31 DIAGNOSIS — I083 Combined rheumatic disorders of mitral, aortic and tricuspid valves: Secondary | ICD-10-CM | POA: Insufficient documentation

## 2022-07-31 DIAGNOSIS — E785 Hyperlipidemia, unspecified: Secondary | ICD-10-CM | POA: Diagnosis not present

## 2022-07-31 DIAGNOSIS — I361 Nonrheumatic tricuspid (valve) insufficiency: Secondary | ICD-10-CM

## 2022-07-31 DIAGNOSIS — Z87891 Personal history of nicotine dependence: Secondary | ICD-10-CM | POA: Diagnosis not present

## 2022-07-31 DIAGNOSIS — I110 Hypertensive heart disease with heart failure: Secondary | ICD-10-CM | POA: Diagnosis not present

## 2022-07-31 DIAGNOSIS — I517 Cardiomegaly: Secondary | ICD-10-CM

## 2022-08-01 LAB — ECHOCARDIOGRAM COMPLETE
AR max vel: 2.25 cm2
AV Area VTI: 2.11 cm2
AV Area mean vel: 2.01 cm2
AV Mean grad: 14.8 mmHg
AV Peak grad: 27 mmHg
Ao pk vel: 2.6 m/s
Area-P 1/2: 5.31 cm2
Calc EF: 53.6 %
MV VTI: 2.88 cm2
S' Lateral: 4.1 cm
Single Plane A2C EF: 56.7 %
Single Plane A4C EF: 50.8 %

## 2022-08-04 NOTE — Progress Notes (Unsigned)
Office Visit    Patient Name: Shannon [name] Date of Encounter: 08/04/2022  Primary Care Provider:  Donato Schultz, FNP Primary Cardiologist:  Nanetta Batty, MD Primary Electrophysiologist: None   Past Medical History    Past Medical History:  Diagnosis Date   Anxiety    Arthritis    "right knee" (05/26/2014)   Asthma    CHF (congestive heart failure) (HCC)    chronic diastolic heart failure - sees Dr. Shirlee Latch   Chronic lower back pain    Dysrhythmia    irregular heart beat   GERD (gastroesophageal reflux disease)    HLD (hyperlipidemia)    Hypertension    Pneumonia    Stroke [facility])    TIA - ?2021   TIA (transient ischemic attack) 04/2018   Past Surgical History:  Procedure Laterality Date   AORTIC VALVE REPLACEMENT N/A 05/07/2022   Procedure: AORTIC VALVE REPLACEMENT (AVR) USING INSPIRIS VALVE SIZE ;  Surgeon: Eugenio Hoes, MD;  Location: [facility] OR;  Service: Open Heart Surgery;  Laterality: N/A;   BRONCHIAL BIOPSY  07/10/2021   Procedure: BRONCHIAL BIOPSIES;  Surgeon: Josephine Igo, DO;  Location: [facility] ENDOSCOPY;  Service: Pulmonary;;   BRONCHIAL BRUSHINGS  07/10/2021   Procedure: BRONCHIAL BRUSHINGS;  Surgeon: Josephine Igo, DO;  Location: [facility] ENDOSCOPY;  Service: Pulmonary;;   BRONCHIAL NEEDLE ASPIRATION BIOPSY  07/10/2021   Procedure: BRONCHIAL NEEDLE ASPIRATION BIOPSIES;  Surgeon: Josephine Igo, DO;  Location: [facility] ENDOSCOPY;  Service: Pulmonary;;   BRONCHIAL NEEDLE ASPIRATION BIOPSY  04/16/2022   Procedure: BRONCHIAL NEEDLE ASPIRATION BIOPSIES;  Surgeon: Josephine Igo, DO;  Location: [facility] ENDOSCOPY;  Service: Cardiopulmonary;;   BRONCHIAL WASHINGS  07/10/2021   Procedure: BRONCHIAL WASHINGS;  Surgeon: Josephine Igo, DO;  Location: [facility] ENDOSCOPY;  Service: Pulmonary;;   COLONOSCOPY WITH PROPOFOL N/A 03/03/2022   Procedure: COLONOSCOPY WITH PROPOFOL;  Surgeon: Jeani Hawking, MD;  Location: [facility] ENDOSCOPY;  Service: Gastroenterology;  Laterality: N/A;    ESOPHAGOGASTRODUODENOSCOPY N/A 05/27/2014   Procedure: ESOPHAGOGASTRODUODENOSCOPY (EGD);  Surgeon: Jeani Hawking, MD;  Location: [facility] ENDOSCOPY;  Service: Endoscopy;  Laterality: N/A;   ESOPHAGOGASTRODUODENOSCOPY (EGD) WITH PROPOFOL N/A 03/03/2022   Procedure: ESOPHAGOGASTRODUODENOSCOPY (EGD) WITH PROPOFOL;  Surgeon: Jeani Hawking, MD;  Location: [facility] ENDOSCOPY;  Service: Gastroenterology;  Laterality: N/A;   FRACTURE SURGERY     MITRAL VALVE REPLACEMENT N/A 05/07/2022   Procedure: MITRAL VALVE (MV) REPLACEMENT USING MOSAIC 310 CINCH SIZE ;  Surgeon: Eugenio Hoes, MD;  Location: [facility] OR;  Service: Open Heart Surgery;  Laterality: N/A;   PATELLA FRACTURE SURGERY Right ~ 2009   RIGHT/LEFT HEART CATH AND CORONARY ANGIOGRAPHY N/A 02/28/2022   Procedure: RIGHT/LEFT HEART CATH AND CORONARY ANGIOGRAPHY;  Surgeon: Corky Crafts, MD;  Location: [facility] INVASIVE CV LAB;  Service: Cardiovascular;  Laterality: N/A;   SAVORY DILATION N/A 03/03/2022   Procedure: SAVORY DILATION;  Surgeon: Jeani Hawking, MD;  Location: [facility] ENDOSCOPY;  Service: Gastroenterology;  Laterality: N/A;   TEE WITHOUT CARDIOVERSION N/A 03/01/2022   Procedure: TRANSESOPHAGEAL ECHOCARDIOGRAM (TEE);  Surgeon: Laurey Morale, MD;  Location: [facility] ENDOSCOPY;  Service: Cardiovascular;  Laterality: N/A;   TEE WITHOUT CARDIOVERSION N/A 05/07/2022   Procedure: TRANSESOPHAGEAL ECHOCARDIOGRAM;  Surgeon: Eugenio Hoes, MD;  Location: [facility] OR;  Service: Open Heart Surgery;  Laterality: N/A;   TONSILLECTOMY  ~ 1970   VAGINAL HYSTERECTOMY  1980's   VIDEO BRONCHOSCOPY WITH ENDOBRONCHIAL ULTRASOUND Bilateral 04/16/2022   Procedure: VIDEO BRONCHOSCOPY WITH ENDOBRONCHIAL ULTRASOUND;  Surgeon: Josephine Igo, DO;  Location: [facility] ENDOSCOPY;  Service: Cardiopulmonary;  Laterality: Bilateral;   VIDEO BRONCHOSCOPY WITH RADIAL ENDOBRONCHIAL ULTRASOUND  07/10/2021   Procedure: RADIAL ENDOBRONCHIAL ULTRASOUND;  Surgeon: Josephine Igo, DO;  Location: [facility] ENDOSCOPY;  Service:  Pulmonary;;    Allergies  Allergies  Allergen Reactions   Amoxicillin-Pot Clavulanate Itching   Lactose Intolerance (Gi) Diarrhea   Other Other (See Comments)    08/06/2018 -    06/05/2016 -    09/15/2014 -    06/16/2014 -     History of Present Illness    Shannon [name] is a 72 y.o. 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  Shannon [name] was  initially seen by Dr.Berry in 2021 for evaluation of palpitations.  She wore 2-week ZIO monitor to rule out AF that revealed occasional PACs/PVCs with short runs of PSVT and NSVT.   She presented for surgical clearance on 02/26/2022 but was unable to be cleared due to severe MVR and TVR on recent 2D echo.  She was instead consented for [facility] and TEE for further evaluation of TVR and MVR. Echo done showed drop in EF to 40-45%. Referred for [facility]. Underwent cath which showed no significant CAD and elevated R+ L heart filling pressures and marginal output .  She was directly admitted and diuresed well.  Structural heart team reviewed and patient was not suitable for mTEER.  She was referred to Dr. Leafy Ro for evaluation for surgical mitral valve replacement as well as AVR.  She is still pending evaluation of mediastinal adenopathy which will occur ahead of EBUS.  She was seen by Dr. Leafy Ro on 04/22/2022 and found to be a candidate for open AVR/MVR replacement.  She required inotropic support with epi and Levophed postprocedure.  Postop echo showed EF of 30-35% and patient was started on low-dose milrinone inotropic support.  She was also volume overloaded and started on Lasix drip.  She was placed on digoxin and amiodarone due to accelerated junctional rhythm.  She developed CKD stage III and acute AKI that resolved before discharge.  She was discharged 05/20/2022 and seen in advanced heart failure clinic on 4/16 and patient was  doing well and feeling fine.  She endorsed occasional dizziness with ambulation but no falls.  She was started on losartan 12.5 daily and beta-blockers were not started due to prolonged first-degree AVB.  She was seen by Dr. Leafy Ro on 06/17/2022 and was doing well and cleared for cardiac rehab.  She was seen last seen in advanced heart failure clinic 07/16/2022 and losartan was increased to 25 mg daily.   Since last being seen in the office patient reports***.  Patient denies chest pain, palpitations, dyspnea, PND, orthopnea, nausea, vomiting, dizziness, syncope, edema, weight gain, or early satiety.     ***Notes:  Home Medications    Current Outpatient Medications  Medication Sig Dispense Refill   albuterol (PROVENTIL) (2.5 MG/3ML) 0.083% nebulizer solution Take 3 mLs (2.5 mg total) by nebulization every 6 (six) hours as needed for wheezing or shortness of breath. 75 mL 12   amiodarone (PACERONE) 200 MG tablet Take 200 mg by mouth daily.     apixaban (ELIQUIS) 5 MG TABS tablet Take 1 tablet (5 mg total) by mouth 2 (two) times daily. 60 tablet 1   aspirin EC 81 MG tablet Take 1 tablet (81 mg total) by mouth daily. Swallow whole. 30 tablet 12  atorvastatin (LIPITOR) 80 MG tablet Take 1 tablet (80 mg total) by mouth daily at 6 PM. 30 tablet 2   busPIRone (BUSPAR) 10 MG tablet Take 10 mg by mouth 2 (two) times daily.     Cholecalciferol (VITAMIN D) 50 MCG (2000 UT) tablet Take 2,000 Units by mouth daily.     dapagliflozin propanediol (FARXIGA) 10 MG TABS tablet Take 1 tablet (10 mg total) by mouth daily. 30 tablet 5   desloratadine (CLARINEX) 5 MG tablet Take 5 mg by mouth daily.     digoxin (LANOXIN) 0.125 MG tablet Take 1 tablet (0.125 mg total) by mouth daily. 30 tablet 1   ezetimibe (ZETIA) 10 MG tablet Take 10 mg by mouth daily.     FLUoxetine (PROZAC) 20 MG capsule Take 20 mg by mouth daily.     fluticasone (FLONASE) 50 MCG/ACT nasal spray Place 1 spray into both nostrils daily.      fluticasone-salmeterol (WIXELA INHUB) 500-50 MCG/ACT AEPB Inhale 1 puff into the lungs in the morning and at bedtime. 60 each 6   furosemide (LASIX) 40 MG tablet Take 1 tablet (40 mg total) by mouth daily. 30 tablet 1   losartan (COZAAR) 25 MG tablet Take 1 tablet (25 mg total) by mouth at bedtime. 90 tablet 3   montelukast (SINGULAIR) 10 MG tablet Take 1 tablet (10 mg total) by mouth at bedtime. 30 tablet 11   pantoprazole (PROTONIX) 40 MG tablet Take 1 tablet (40 mg total) by mouth daily. 90 tablet 1   Potassium Chloride (KLOR-CON PO) Take 20 mEq by mouth daily.     spironolactone (ALDACTONE) 25 MG tablet Take 1/2 tablet (12.5 mg total) by mouth daily. 30 tablet 3   No current facility-administered medications for this visit.     Review of Systems  Please see the history of present illness.    (+)*** (+)***  All other systems reviewed and are otherwise negative except as noted above.  Physical Exam    Wt Readings from Last 3 Encounters:  07/16/22 135 lb (61.2 kg)  07/01/22 131 lb 6.4 oz (59.6 kg)  06/17/22 132 lb (59.9 kg)   ZO:XWRUE were no vitals filed for this visit.,There is no height or weight on file to calculate BMI.  Constitutional:      Appearance: Healthy appearance. Not in distress.  Neck:     Vascular: JVD normal.  Pulmonary:     Effort: Pulmonary effort is normal.     Breath sounds: No wheezing. No rales. Diminished in the bases Cardiovascular:     Normal rate. Regular rhythm. Normal S1. Normal S2.      Murmurs: There is no murmur.  Edema:    Peripheral edema absent.  Abdominal:     Palpations: Abdomen is soft non tender. There is no hepatomegaly.  Skin:    General: Skin is warm and dry.  Neurological:     General: No focal deficit present.     Mental Status: Alert and oriented to person, place and time.     Cranial Nerves: Cranial nerves are intact.  EKG/LABS/ Recent Cardiac Studies    ECG personally reviewed by me today - ***  Cardiac Studies &  Procedures   CARDIAC CATHETERIZATION  CARDIAC CATHETERIZATION 02/28/2022  Narrative   Prox RCA lesion is 10% stenosed.   LV end diastolic pressure is moderately elevated.   There is no aortic valve stenosis.   No angiographically apparent left sided coronary artery disease.   Hemodynamic findings consistent with  moderate pulmonary hypertension.   Aortic saturation 83%, PA saturation 48%, mean RA pressure 18 mmHg, PA pressure 50/28, mean PA pressure 37 mmHg, pulmonary capillary wedge pressure 35/36, mean pulmonary capillary wedge pressure 30 mmHg, cardiac output 3.92 L/min, cardiac index 2.27.  Volume overloaded based on right heart pressures.  Will admit for IV diuresis and further workup of her mitral regurgitation.  She will also need EGD.  She is currently receiving clopidogrel.  This will likely need to be held prior to EGD.  Findings Coronary Findings Diagnostic  Dominance: Right  Left Main Vessel was injected. Vessel is normal in caliber. Vessel is angiographically normal.  Left Anterior Descending Vessel was injected. Vessel is normal in caliber. Vessel is angiographically normal.  Left Circumflex Vessel was injected. Vessel is normal in caliber. Vessel is angiographically normal.  Right Coronary Artery Vessel was injected. Vessel is normal in caliber. Vessel is angiographically normal. Prox RCA lesion is 10% stenosed.  Intervention  No interventions have been documented.     ECHOCARDIOGRAM  ECHOCARDIOGRAM COMPLETE 08/01/2022  Narrative ECHOCARDIOGRAM REPORT    Patient Name:   Shannon [name] Date of Exam: 07/31/2022 Medical Rec #:  [id number]          Height:       66.0 in Accession #:    [id number]         Weight:       135.0 lb Date of Birth:  [birth year]          BSA:          1.692 m Patient Age:    72 years           BP:           130/72 mmHg Patient Gender: F                  HR:           77 bpm. Exam Location:  Outpatient  Procedure: 2D Echo, 3D  Echo, Cardiac Doppler, Color Doppler and Strain Analysis  Indications:    I50.22 Chronic systolic (congestive) heart failure  History:        Patient has prior history of Echocardiogram examinations, most recent 05/09/2022. CHF, Stroke and COPD, Aortic Valve Disease and Mitral Valve Disease; Risk Factors:Hypertension, Dyslipidemia and Former Smoker. Aortic Valve: 21 mm Inspiris valve is present in the aortic position. Mitral Valve: 31 mm Mosiac 310 Cinch valve is present in the mitral position.  Sonographer:    Jake Seats RDMS, RVT, RDCS Referring Phys: TIFFANY N LUCAS  IMPRESSIONS   1. Left ventricular ejection fraction, by estimation, is 45 to 50%. The left ventricle has mildly decreased function. The left ventricle demonstrates global hypokinesis. There is mild asymmetric left ventricular hypertrophy of the infero-lateral segment. Left ventricular diastolic function could not be evaluated. The average left ventricular global longitudinal strain is -13.5 %. The global longitudinal strain is abnormal. 2. Right ventricular systolic function is normal. The right ventricular size is normal. There is normal pulmonary artery systolic pressure. The estimated right ventricular systolic pressure is 33.9 mmHg. 3. Left atrial size was moderately dilated. 4. Right atrial size was mildly dilated. 5. The mitral valve has been repaired/replaced. Trivial mitral valve regurgitation. No evidence of mitral stenosis. The mean mitral valve gradient is 4.5 mmHg. There is a 31 mm Mosiac 310 Cinch present in the mitral position. 6. Tricuspid valve regurgitation is moderate. 7. The aortic valve has been repaired/replaced. Aortic valve regurgitation is not  visualized. No aortic stenosis is present. There is a 21 mm Inspiris valve present in the aortic position. Aortic valve area, by VTI measures 2.11 cm. Aortic valve mean gradient measures 14.8 mmHg. Aortic valve Vmax measures 2.60 m/s. DVI 0.67. 8. The  inferior vena cava is normal in size with greater than 50% respiratory variability, suggesting right atrial pressure of 3 mmHg.  Comparison(s): Echocardiogram done 05/09/22 showed an EF of 30-35% with an AV Mean Grad of 14.5 mmHg and a MV Mean Grad of 4 mmHg.  FINDINGS Left Ventricle: Left ventricular ejection fraction, by estimation, is 45 to 50%. The left ventricle has mildly decreased function. The left ventricle demonstrates global hypokinesis. The average left ventricular global longitudinal strain is -13.5 %. The global longitudinal strain is abnormal. 3D ejection fraction reviewed and evaluated as part of the interpretation. Alternate measurement of EF is felt to be most reflective of LV function. The left ventricular internal cavity size was normal in size. There is mild asymmetric left ventricular hypertrophy of the infero-lateral segment. Abnormal (paradoxical) septal motion consistent with post-operative status. Left ventricular diastolic function could not be evaluated due to mitral valve repair. Left ventricular diastolic function could not be evaluated.  Right Ventricle: The right ventricular size is normal. No increase in right ventricular wall thickness. Right ventricular systolic function is normal. There is normal pulmonary artery systolic pressure. The tricuspid regurgitant velocity is 2.78 m/s, and with an assumed right atrial pressure of 3 mmHg, the estimated right ventricular systolic pressure is 33.9 mmHg.  Left Atrium: Left atrial size was moderately dilated.  Right Atrium: Right atrial size was mildly dilated.  Pericardium: There is no evidence of pericardial effusion.  Mitral Valve: The mitral valve has been repaired/replaced. Trivial mitral valve regurgitation. There is a 31 mm Mosiac 310 Cinch present in the mitral position. No evidence of mitral valve stenosis. MV peak gradient, 9.0 mmHg. The mean mitral valve gradient is 4.5 mmHg.  Tricuspid Valve: The tricuspid  valve is normal in structure. Tricuspid valve regurgitation is moderate . No evidence of tricuspid stenosis.  Aortic Valve: The aortic valve has been repaired/replaced. Aortic valve regurgitation is not visualized. No aortic stenosis is present. Aortic valve mean gradient measures 14.8 mmHg. Aortic valve peak gradient measures 27.0 mmHg. Aortic valve area, by VTI measures 2.11 cm. There is a 21 mm Inspiris valve present in the aortic position.  Pulmonic Valve: The pulmonic valve was normal in structure. Pulmonic valve regurgitation is trivial. No evidence of pulmonic stenosis.  Aorta: The aortic root is normal in size and structure.  Venous: The inferior vena cava is normal in size with greater than 50% respiratory variability, suggesting right atrial pressure of 3 mmHg.  IAS/Shunts: No atrial level shunt detected by color flow Doppler.   LEFT VENTRICLE PLAX 2D LVIDd:         4.90 cm     Diastology LVIDs:         4.10 cm     LV e' medial:    6.64 cm/s LV PW:         1.30 cm     LV E/e' medial:  18.2 LV IVS:        1.00 cm     LV e' lateral:   9.36 cm/s LVOT diam:     2.00 cm     LV E/e' lateral: 12.9 LV SV:         104 LV SV Index:   61  2D Longitudinal Strain LVOT Area:     3.14 cm    2D Strain GLS (A2C):   -14.1 % 2D Strain GLS (A3C):   -13.8 % 2D Strain GLS (A4C):   -12.5 % LV Volumes (MOD)           2D Strain GLS Avg:     -13.5 % LV vol d, MOD A2C: 94.6 ml LV vol d, MOD A4C: 84.7 ml LV vol s, MOD A2C: 41.0 ml LV vol s, MOD A4C: 41.7 ml 3D Volume EF: LV SV MOD A2C:     53.6 ml 3D EF:        51 % LV SV MOD A4C:     84.7 ml LV EDV:       145 ml LV SV MOD BP:      49.9 ml LV ESV:       71 ml LV SV:        73 ml  RIGHT VENTRICLE TAPSE (M-mode): 1.5 cm  LEFT ATRIUM             Index        RIGHT ATRIUM           Index LA diam:        3.80 cm 2.25 cm/m   RA Area:     17.70 cm LA Vol (A2C):   70.7 ml 41.78 ml/m  RA Volume:   47.30 ml  27.95 ml/m LA Vol (A4C):    76.0 ml 44.91 ml/m LA Biplane Vol: 73.5 ml 43.44 ml/m AORTIC VALVE AV Area (Vmax):    2.25 cm AV Area (Vmean):   2.01 cm AV Area (VTI):     2.11 cm AV Vmax:           259.60 cm/s AV Vmean:          177.600 cm/s AV VTI:            0.492 m AV Peak Grad:      27.0 mmHg AV Mean Grad:      14.8 mmHg LVOT Vmax:         186.00 cm/s LVOT Vmean:        113.600 cm/s LVOT VTI:          0.331 m LVOT/AV VTI ratio: 0.67  AORTA Ao Root diam: 3.40 cm  MITRAL VALVE                TRICUSPID VALVE MV Area (PHT): 5.31 cm     TR Peak grad:   30.9 mmHg MV Area VTI:   2.88 cm     TR Vmax:        278.00 cm/s MV Peak grad:  9.0 mmHg MV Mean grad:  4.5 mmHg     SHUNTS MV Vmax:       1.50 m/s     Systemic VTI:  0.33 m MV Vmean:      95.4 cm/s    Systemic Diam: 2.00 cm MV Decel Time: 143 msec MV E velocity: 121.00 cm/s MV A velocity: 108.30 cm/s MV E/A ratio:  1.12  Armanda Magic MD Electronically signed by Armanda Magic MD Signature Date/Time: 08/01/2022/2:54:42 PM    Final   TEE  ECHO INTRAOPERATIVE TEE 05/07/2022  Narrative *INTRAOPERATIVE TRANSESOPHAGEAL REPORT *    Patient Name:   Shannon [name] Date of Exam: 05/07/2022 Medical Rec #:  [id number]          Height:       66.0  in Accession #:    [id number]         Weight:       139.0 lb Date of Birth:  [birth year]          BSA:          1.71 m Patient Age:    71 years           BP:           117/63 mmHg Patient Gender: F                  HR:           71 bpm. Exam Location:  Inpatient  Transesophogeal exam was perform intraoperatively during surgical procedure. Patient was closely monitored under general anesthesia during the entirety of examination.  Indications:     mitral, aortic and tricuspid regurgitation Sonographer:     Delcie Roch RDCS Performing Phys: [id number] Eugenio Hoes Diagnosing Phys: Jairo Ben MD  Complications: No known complications during this procedure. POST-OP IMPRESSIONS Limited Post CPB  exam: The patient separated easily from CPB on Epinephrine and phenylephrine infusions.  _ Left Ventricle: The left ventricle has severely reduced systolic function. There is septal flattening, consistent with pacing. There is global hypokinesis. The cavity size was mildly dilated. Overall EF measures 30%. _ Right Ventricle: The Right ventricle has mildly reduced function. The cavity size was normal. _ Aortic Valve: A bioprosthetic valve was placed in the aortic position, leaflets are freely mobile. There is no perivalvular leak, no insufficiency, and no stenosis. Peak gradient 16 mmHg, mean gradient 6 mmHg. _ Mitral Valve: A bioprosthetic valve was placed, leaflets are freely mobile. There is no perivalvular leak, no MR, and no mitral stenosis. Peak gradient 3 mmHg, mean gradient 2 mmHg. _ Tricuspid Valve: There was severe TR on initial separation from CPB. This improved to mild with time and Inotropy.  PRE-OP FINDINGS Left Ventricle: The left ventricle has mildly reduced systolic function, with an ejection fraction of 45-50%, measured 50%. The cavity size was normal. Left ventrical mild global hypokinesis without regional wall motion abnormalities. There is no left ventricular hypertrophy. Left ventricular diastolic function was not evaluated.  Right Ventricle: The right ventricle has normal systolic function. The cavity was normal. There is no increase in right ventricular wall thickness. Catheter present in the right ventricle.  Left Atrium: Left atrial size was mildly dilated. No left atrial/left atrial appendage thrombus was detected. Left atrial appendage velocity is normal at greater than 40 cm/s.  Right Atrium: Right atrial size was normal in size. Catheter present in the right atrium.  Interatrial Septum: No atrial level shunt detected by color flow Doppler. There is no evidence of a patent foramen ovale.  Pericardium: There is no evidence of pericardial effusion.  Mitral  Valve: The mitral valve is rheumatic. Mitral valve regurgitation is moderate by color flow Doppler (mild with legs supine, mod with legs elevated). The MR jet is centrally-directed. There is no evidence of mitral valve vegetation. Pulmonary venous flow is blunted (decreased), and without flow reversal. There is no evidence of mitral stenosis with peak gradient 4 mmHg, mean gradient 2 mmHg.  Tricuspid Valve: The tricuspid valve was normal in structure. Tricuspid valve regurgitation is mild by color flow Doppler. The jet is directed centrally. No evidence of tricuspid stenosis is present. There is no evidence of tricuspid valve vegetation.  Aortic Valve: The aortic valve is tricuspid. Aortic valve regurgitation is moderate by color flow Doppler. The  jet is centrally-directed. There is no stenosis of the aortic valve, with peak gradient 9 mmHg, mean gradient 5 mmHg. There is no evidence of aortic valve vegetation. There are moderately thickened leaflet edges present on the aortic valve non-coronary, left coronary and right coronary cusps with normal mobility.  Pulmonic Valve: The pulmonic valve was normal in structure, with normal leaflet mobility. No evidence of pulmonic stenosis. Pulmonic valve regurgitation is trivial by color flow Doppler.   Aorta: The aortic root, ascending aorta and aortic arch are normal in size and structure. There is evidence of plaque in the descending aorta; Grade I, measuring 1-65mm in size.  Pulmonary Artery: Theone Murdoch catheter present on the right. The pulmonary artery is of normal size.  Venous: The inferior vena cava is normal in size with greater than 50% respiratory variability, suggesting right atrial pressure of 3 mmHg.  Shunts: There is no evidence of an atrial septal defect.  +-------------+--------++ AORTIC VALVE          +-------------+--------++ AV Mean Grad:5.0 mmHg +-------------+--------++  +-------------+--------++ MITRAL VALVE           +-------------+--------++ MV Mean grad:2.0 mmHg +-------------+--------++   Jairo Ben MD Electronically signed by Jairo Ben MD Signature Date/Time: 05/07/2022/4:16:21 PM    Final   MONITORS  LONG TERM MONITOR (3-14 DAYS) 10/11/2019  Narrative 1: Normal sinus rhythm/sinus tachycardia 2: Occasional PACs, frequent PVCs 3: Short runs of SVT and NSVT   CT SCANS  CT CARDIAC SCORING (SELF PAY ONLY) 04/21/2020  Addendum 04/21/2020  3:49 PM ADDENDUM REPORT: 04/21/2020 15:47  CLINICAL DATA:  Risk stratification: 72 Year-old African American Female  EXAM: Coronary Calcium Score  TECHNIQUE: The patient was scanned on a [employer] scanner. Axial non-contrast 3 mm slices were carried out through the heart. The data set was analyzed on a dedicated work station and scored using the Agatson method.  FINDINGS: Non-cardiac: See separate report from [facility].  Ascending Aorta: Normal caliber.  Aortic atherosclerosis noted.  Aortic Valve Calcium Score: 1086.  Pericardium: Normal.  Coronary arteries: Normal origins.  Coronary calcium score of 507. This was 94th percentile for age, gender, and race matched controls.  IMPRESSION: 1. Coronary calcium score of 507. This was 94th percentile for age, gender, and race matched controls.  2. Aortic atherosclerosis noted.  3. Aortic Valve Calcium Score: 1086.  Riley Lam, MD   Electronically Signed By: Riley Lam MD On: 04/21/2020 15:47  Narrative EXAM: OVER-READ INTERPRETATION  CT CHEST  The following report is an over-read performed by radiologist Dr. Jeronimo Greaves of [facility] on 04/21/2020. This over-read does not include interpretation of cardiac or coronary anatomy or pathology. The calcium score interpretation by the cardiologist is attached.  COMPARISON:  Chest radiograph of 05/04/2018  FINDINGS: Vascular: Aortic  atherosclerosis.  Mediastinum/Nodes: No imaged thoracic adenopathy. Tiny hiatal hernia.  Lungs/Pleura: No pleural fluid. Right middle lobe 4 mm nodule on 20/3.  Upper Abdomen: Normal imaged portions of the liver, spleen.  Musculoskeletal: No acute osseous abnormality.  IMPRESSION: 1.  No acute findings in the imaged extracardiac chest. 2.  Aortic Atherosclerosis (ICD10-I70.0). 3. Right middle lobe 4 mm pulmonary nodule. No follow-up needed if patient is low-risk. Non-contrast chest CT can be considered in 12 months if patient is high-risk. This recommendation follows the consensus statement: Guidelines for Management of Incidental Pulmonary Nodules Detected on CT Images: From the Fleischner Society 2017; Radiology 2017; 284:228-243. 4.  Tiny hiatal hernia.  Electronically Signed: By: Ronaldo Miyamoto  Reche Dixon M.D. On: 04/21/2020 13:17   CARDIAC MRI  MR CARDIAC MORPHOLOGY W WO CONTRAST 03/02/2022  Narrative CLINICAL DATA:  Cardiomyopathy of uncertain etiology  EXAM: CARDIAC MRI  TECHNIQUE: The patient was scanned on a 1.5 Tesla GE magnet. A dedicated cardiac coil was used. Functional imaging was done using Fiesta sequences. 2,3, and 4 chamber views were done to assess for RWMA's. Modified Simpson's rule using a short axis stack was used to calculate an ejection fraction on a dedicated work [employer]. The patient received 9 cc of Gadavist. After 10 minutes inversion recovery sequences were used to assess for infiltration and scar tissue.  CONTRAST:  Gadavist 9 cc  FINDINGS: Limited images of the lung fields showed increased interstitial markings at bases and mediastinal adenopathy.  Normal left ventricular size and wall thickness, diffuse hypokinesis with EF 18%. Normal right ventricular size with EF 25%. Moderate left atrial enlargement. Mild right atrial enlargement. Trileaflet aortic valve, the valve leaflet tips are thickened, moderate  aortic insufficiency visually but severe by regurgitant fraction (49%). Thickened mitral leaflet tips. There is at least moderate and possibly severe mitral regurgitation. I do not think that calculated regurgitant fraction is going to be accurate given low stroke volume and high risk for error with MR regurgitant fraction calculation.  On delayed enhancement imaging, there was mid-wall late gadolinium enhancement (LGE) in the basal septal wall, possibly in the basal lateral wall.  Measurements: LVEDV 149 mL LVEDVi 87 mL/m2  LVSV 27 mL LVEF 18%  RVEDV 130 mL  RVEDVi 76 mL/m2  RVSV 32 mL RVEF 25%  Aortic forward volume 19 mL  Aortic regurgitant fraction 49%  T1 1014, ECV 37%  IMPRESSION: 1.  Normal LV size with diffuse hypokinesis, EF 18%.  2.  Normal RV size with EF 25%.  3. Aortic insufficiency looks at least moderate, regurgitant fraction 49% suggests severe aortic insufficiency. Thickened leaflet tips.  4. Thickened mitral valve leaflet tips. At least moderate and probably severe mitral regurgitation. Low stroke volume introduces error to the regurgitant fraction calculation.  5. Mid wall LGE in the basal septal and possible subtle LGE in the basal lateral wall. This is not a CAD pattern, possible prior myocarditis. Cannot fully rule out cardiac sarcoidosis involvement given mediastinal lymphadenopathy.  6. Elevated extracellular volume percentage suggests increased fibrotic content in LV myocardium.  Dalton Mclean   Electronically Signed By: Marca Ancona M.D. On: 03/02/2022 12:19         Risk Assessment/Calculations:   {Does this patient have ATRIAL FIBRILLATION?:[phone]}        Lab Results  Component Value Date   WBC 9.1 07/16/2022   HGB 12.0 07/16/2022   HCT 38.9 07/16/2022   MCV 92.2 07/16/2022   PLT 277 07/16/2022   Lab Results  Component Value Date   CREATININE 1.35 (H) 07/29/2022   BUN 21 07/29/2022   NA 138 07/29/2022   K  3.5 07/29/2022   CL 108 07/29/2022   CO2 22 07/29/2022   Lab Results  Component Value Date   ALT 21 05/03/2022   AST 28 05/03/2022   ALKPHOS 94 05/03/2022   BILITOT 1.6 (H) 05/03/2022   Lab Results  Component Value Date   CHOL 193 06/17/2019   HDL 71 06/17/2019   LDLCALC 109 (H) 06/17/2019   TRIG 71 06/17/2019   CHOLHDL 2.7 06/17/2019    Lab Results  Component Value Date   HGBA1C 7.1 (H) 05/03/2022     Assessment & Plan  1.  Severe MVR/AVR s/p replacement:  2.  Chronic systolic CHF:  3. Nonobstructive coronary artery disease: -s/p coronary calcium scoring with calcium score of 507 with aortic atherosclerosis noted and PET/CT with coronary calcifications noted.  4.  Pulmonary hypertension:  5.  History of TIA/CVA:  6.  CKD stage IIIa:      Disposition: Follow-up with Nanetta Batty, MD or APP in *** months {Are you ordering a CV Procedure (e.g. stress test, cath, DCCV, TEE, etc)?   Press F2        :[id number]}   Medication Adjustments/Labs and Tests Ordered: Current medicines are reviewed at length with the patient today.  Concerns regarding medicines are outlined above.   Signed, Napoleon Form, Leodis Rains, NP 08/04/2022, 2:19 PM  [facility]

## 2022-08-05 ENCOUNTER — Ambulatory Visit: Payer: Medicare HMO | Attending: Nurse Practitioner | Admitting: Nurse Practitioner

## 2022-08-05 ENCOUNTER — Encounter: Payer: Self-pay | Admitting: Nurse Practitioner

## 2022-08-05 VITALS — BP 110/60 | HR 80 | Ht 66.0 in | Wt 137.0 lb

## 2022-08-05 DIAGNOSIS — I2510 Atherosclerotic heart disease of native coronary artery without angina pectoris: Secondary | ICD-10-CM

## 2022-08-05 DIAGNOSIS — Z9889 Other specified postprocedural states: Secondary | ICD-10-CM | POA: Diagnosis not present

## 2022-08-05 DIAGNOSIS — N183 Chronic kidney disease, stage 3 unspecified: Secondary | ICD-10-CM

## 2022-08-05 DIAGNOSIS — G459 Transient cerebral ischemic attack, unspecified: Secondary | ICD-10-CM | POA: Diagnosis not present

## 2022-08-05 DIAGNOSIS — Z952 Presence of prosthetic heart valve: Secondary | ICD-10-CM | POA: Diagnosis not present

## 2022-08-05 DIAGNOSIS — I272 Pulmonary hypertension, unspecified: Secondary | ICD-10-CM | POA: Diagnosis not present

## 2022-08-05 DIAGNOSIS — Z8679 Personal history of other diseases of the circulatory system: Secondary | ICD-10-CM

## 2022-08-05 MED ORDER — DIGOXIN 125 MCG PO TABS: 0.1250 mg | ORAL_TABLET | Freq: Every day | ORAL | 3 refills | Status: DC

## 2022-08-05 MED ORDER — APIXABAN 5 MG PO TABS: 5.0000 mg | ORAL_TABLET | Freq: Two times a day (BID) | ORAL | 3 refills | Status: DC

## 2022-08-05 NOTE — Patient Instructions (Signed)
Medication Instructions:  Your physician recommends that you continue on your current medications as directed. Please refer to the Current Medication list given to you today. *If you need a refill on your cardiac medications before your next appointment, please call your pharmacy*   Lab Work: None ordered If you have labs (blood work) drawn today and your tests are completely normal, you will receive your results only by: MyChart Message (if you have MyChart) OR A paper copy in the mail If you have any lab test that is abnormal or we need to change your treatment, we will call you to review the results.   Testing/Procedures: None ordered   Follow-Up: At [facility], you and your health needs are our priority.  As part of our continuing mission to provide you with exceptional heart care, we have created designated Provider Care Teams.  These Care Teams include your primary Cardiologist (physician) and Advanced Practice Providers (APPs -  Physician Assistants and Nurse Practitioners) who all work together to provide you with the care you need, when you need it.  We recommend signing up for the patient portal called "MyChart".  Sign up information is provided on this After Visit Summary.  MyChart is used to connect with patients for Virtual Visits (Telemedicine).  Patients are able to view lab/test results, encounter notes, upcoming appointments, etc.  Non-urgent messages can be sent to your provider as well.   To learn more about what you can do with MyChart, go to [email or web address].    Your next appointment:   3 month(s)  Provider:   Robin Searing, NP       Other Instructions

## 2022-08-07 ENCOUNTER — Telehealth (HOSPITAL_COMMUNITY): Payer: Self-pay

## 2022-08-07 NOTE — Telephone Encounter (Addendum)
Pt aware, agreeable, and verbalized understanding   ----- Message from Jacklynn Ganong, FNP sent at 08/02/2022  5:27 PM EDT ----- EF improving, now up to 45-50%, stable mitral valve with mild MR, stable AoV, moderate TR

## 2022-08-28 ENCOUNTER — Other Ambulatory Visit (HOSPITAL_COMMUNITY): Payer: Self-pay | Admitting: Cardiology

## 2022-08-28 MED ORDER — AMIODARONE HCL 200 MG PO TABS: 200.0000 mg | ORAL_TABLET | Freq: Every day | ORAL | 3 refills | Status: DC

## 2022-09-03 ENCOUNTER — Other Ambulatory Visit: Payer: Self-pay | Admitting: Surgical

## 2022-09-07 ENCOUNTER — Other Ambulatory Visit: Payer: Self-pay | Admitting: Nurse Practitioner

## 2022-09-09 ENCOUNTER — Other Ambulatory Visit: Payer: Self-pay

## 2022-09-09 MED ORDER — FUROSEMIDE 40 MG PO TABS: 40.0000 mg | ORAL_TABLET | Freq: Every day | ORAL | 3 refills | Status: DC

## 2022-09-16 ENCOUNTER — Ambulatory Visit (HOSPITAL_COMMUNITY)
Admission: RE | Admit: 2022-09-16 | Discharge: 2022-09-16 | Disposition: A | Discharge status: Home / Self Care | Payer: Medicare HMO | Source: Ambulatory Visit | Attending: Cardiology | Admitting: Cardiology

## 2022-09-16 ENCOUNTER — Encounter (HOSPITAL_COMMUNITY): Payer: Self-pay | Admitting: Cardiology

## 2022-09-16 VITALS — BP 120/78 | HR 68 | Wt 136.6 lb

## 2022-09-16 DIAGNOSIS — E785 Hyperlipidemia, unspecified: Secondary | ICD-10-CM | POA: Insufficient documentation

## 2022-09-16 DIAGNOSIS — Z7902 Long term (current) use of antithrombotics/antiplatelets: Secondary | ICD-10-CM | POA: Insufficient documentation

## 2022-09-16 DIAGNOSIS — I082 Rheumatic disorders of both aortic and tricuspid valves: Secondary | ICD-10-CM | POA: Insufficient documentation

## 2022-09-16 DIAGNOSIS — Z79899 Other long term (current) drug therapy: Secondary | ICD-10-CM | POA: Insufficient documentation

## 2022-09-16 DIAGNOSIS — I428 Other cardiomyopathies: Secondary | ICD-10-CM | POA: Insufficient documentation

## 2022-09-16 DIAGNOSIS — Z8673 Personal history of transient ischemic attack (TIA), and cerebral infarction without residual deficits: Secondary | ICD-10-CM | POA: Diagnosis not present

## 2022-09-16 DIAGNOSIS — Z7901 Long term (current) use of anticoagulants: Secondary | ICD-10-CM | POA: Diagnosis not present

## 2022-09-16 DIAGNOSIS — I130 Hypertensive heart and chronic kidney disease with heart failure and stage 1 through stage 4 chronic kidney disease, or unspecified chronic kidney disease: Secondary | ICD-10-CM | POA: Diagnosis not present

## 2022-09-16 DIAGNOSIS — Z87891 Personal history of nicotine dependence: Secondary | ICD-10-CM | POA: Insufficient documentation

## 2022-09-16 DIAGNOSIS — I2722 Pulmonary hypertension due to left heart disease: Secondary | ICD-10-CM | POA: Diagnosis not present

## 2022-09-16 DIAGNOSIS — Z01810 Encounter for preprocedural cardiovascular examination: Secondary | ICD-10-CM | POA: Insufficient documentation

## 2022-09-16 DIAGNOSIS — N183 Chronic kidney disease, stage 3 unspecified: Secondary | ICD-10-CM | POA: Diagnosis not present

## 2022-09-16 DIAGNOSIS — I5022 Chronic systolic (congestive) heart failure: Secondary | ICD-10-CM | POA: Diagnosis not present

## 2022-09-16 DIAGNOSIS — I5042 Chronic combined systolic (congestive) and diastolic (congestive) heart failure: Secondary | ICD-10-CM | POA: Diagnosis not present

## 2022-09-16 DIAGNOSIS — Z953 Presence of xenogenic heart valve: Secondary | ICD-10-CM | POA: Insufficient documentation

## 2022-09-16 DIAGNOSIS — I38 Endocarditis, valve unspecified: Secondary | ICD-10-CM | POA: Diagnosis present

## 2022-09-16 LAB — BASIC METABOLIC PANEL
Anion gap: 11 (ref 5–15)
BUN: 24 mg/dL — ABNORMAL HIGH (ref 8–23)
CO2: 24 mmol/L (ref 22–32)
Calcium: 9.4 mg/dL (ref 8.9–10.3)
Chloride: 103 mmol/L (ref 98–111)
Creatinine, Ser: 1.56 mg/dL — ABNORMAL HIGH (ref 0.44–1.00)
GFR, Estimated: 35 mL/min — ABNORMAL LOW (ref >60)
Glucose, Bld: 105 mg/dL — ABNORMAL HIGH (ref 70–99)
Potassium: 4.3 mmol/L (ref 3.5–5.1)
Sodium: 138 mmol/L (ref 135–145)

## 2022-09-16 LAB — CBC
HCT: 45.1 % (ref 36.0–46.0)
Hemoglobin: 13.8 g/dL (ref 12.0–15.0)
MCH: 28.2 pg (ref 26.0–34.0)
MCHC: 30.6 g/dL (ref 30.0–36.0)
MCV: 92.2 fL (ref 80.0–100.0)
Platelets: 232 10*3/uL (ref 150–400)
RBC: 4.89 MIL/uL (ref 3.87–5.11)
RDW: 16.7 % — ABNORMAL HIGH (ref 11.5–15.5)
WBC: 5.1 10*3/uL (ref 4.0–10.5)
nRBC: 0 % (ref 0.0–0.2)

## 2022-09-16 LAB — LIPID PANEL
Cholesterol: 208 mg/dL — ABNORMAL HIGH (ref 0–200)
HDL: 89 mg/dL (ref >40)
LDL Cholesterol: 106 mg/dL — ABNORMAL HIGH (ref 0–99)
Total CHOL/HDL Ratio: 2.3 RATIO
Triglycerides: 64 mg/dL (ref <150)
VLDL: 13 mg/dL (ref 0–40)

## 2022-09-16 LAB — DIGOXIN LEVEL: Digoxin Level: 1.6 ng/mL (ref 0.8–2.0)

## 2022-09-16 LAB — BRAIN NATRIURETIC PEPTIDE: B Natriuretic Peptide: 117.7 pg/mL — ABNORMAL HIGH (ref 0.0–100.0)

## 2022-09-16 LAB — TSH: TSH: 0.952 u[IU]/mL (ref 0.350–4.500)

## 2022-09-16 MED ORDER — SPIRONOLACTONE 25 MG PO TABS: 25.0000 mg | ORAL_TABLET | Freq: Every day | ORAL | 3 refills | Status: DC

## 2022-09-16 MED ORDER — CLOPIDOGREL BISULFATE 75 MG PO TABS: 75.0000 mg | ORAL_TABLET | Freq: Every day | ORAL | 3 refills | Status: DC

## 2022-09-16 NOTE — Progress Notes (Signed)
ADVANCED HF CLINIC NOTE   Primary Care: Donato Schultz, FNP HF Cardiologist: Dr. Shirlee Latch  HPI: 72 y.o. female w/ h/o chronic diastolic heart failure, HTN, HLD, h/o TIA, GERD and asthma. Former smoker (quit 72 y/o). Noted to have high coronary calcium score of 507 in March 2022. Referred to lipid clinic for aggressive tx of HLD but did not undergo cath in absence of symptoms. Echo 04/2018 showed normal LVEF and RV. No significant valvular dysfunction.    Recently referred back to cardiology for surgical clearance prior to undergoing planned EGD and EBUS (ordered given progressive dysphagia, wt loss and abnormal chest CT). She endorsed exertional dyspnea w/ inability to complete of activity w/o symptoms. Echo was done and showed drop in EF down to 40-45% w/ diffuse HK, severe LAE mod-severe MR, severely elevated RVSP, RV normal, mod-severe RAE and mod-severe TR. Subsequently referred for [facility].    [facility] 1/24 showed no significant CAD, only 10% pRCA narrowing, elevated R+ L heart filling pressures and marginal output, (PA saturation 48%, mean RA pressure 18 mmHg, PA pressure 50/28, mean PA pressure 37 mmHg, pulmonary capillary wedge pressure 35/36, mean pulmonary capillary wedge pressure 30 mmHg, cardiac output 3.92 L/min, cardiac index 2.27).    She was direct admitted from the cath lab for IV diuresis. TEE showed EF 30-35%, mild RV dysfunction, severe MR, moderate TR, moderate AI.  Cardiac MRI showed LV EF 18%, RV EF 25%, ?severe AI, moderate-severe MR (regurgitant fraction not likely accurate with low SV), non-coronary mid-wall LGE in the basal septal wall.  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  She had dysphagia and underwent EGD/colonoscopy showing esophageal candidiasis, unremarkable colonoscopy. Hospitalization also complicated by episode of SVT requiring adenosine. Her GDMT was titrated, but limited by soft BP. Structural Heart  team to review TEE to decide on mTEER candidacy. She was discharged home, weight 134 lbs.  Admitted 3/24 for scheduled MVR/AVR for severe MR, moderate AI and TR. S/p successful bioprosthetic MVR/AVR. AHF consulted for management of HF. Post op echo in 3/24 showed EF 30-35%, RV mod reduced, stable AV and MV prosthesis. After pressors/inotropes weaned off, GDMT titrated but limited by soft BPs. Had accelerated junction rhythm after surgery and started on amiodarone. She was discharged home, weight 137.5 lbs.  Echo in 6/24 showed EF 45-50%, normal RV, moderate TR, bioprosthetic MV with mean gradient 4.5 mmHg, bioprosthetic aortic valve with mean gradient 15 mmHg.   Today she returns for followup of CHF.  No dyspnea unless lifting something heavy.  Weight stable.  No BRBPR/melena. Ok walking up a flight of stairs. Occasional chest pain at surgical site with stretching of chest. No palpitations.   ECG (personally reviewed): NSR 1st degree AVB, LVH  Labs (1/24): K 4.4, creatinine 1.42, AST 85, ALT 73 Labs (1/24): K 3.6, creatinine 2.03 Labs (4/24): K 3.3, creatinine 1.02, Mg 1.9, TSH 1.9 Labs (5/24): K 4.2, Scr 1.22, hgb 12 Labs (6/24): K 3.5, creatinine 1.35  PMH: 1. 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  2. COPD: Prior smoker.  3. Mediastinal lymphadenopathy: PET-CT with hypermetabolic lymphadenopathy, ?lymphoproliferative disorder vs primary bronchogenic carcinoma vs sarcoidosis.  4. CKD stage 3 5. SVT: Occurred in 1/24, required adenosine.  6. Chronic systolic CHF:  Nonischemic cardiomyopathy.  - Echo (3/20): EF 50-55%, RV normal, mild MR - Echo (1/24): EF 40-45%, Severe LAE, mod-severe MR, severely elevated RVSP, RV normal, mod-severe RAE, mod-severe TR  - TEE (1/24): EF 30-35%, severe MR,  moderate TR, moderate AI - Cardiac MRI (1/24): LVEF 18%, RVEF 25%, moderate AI visually but regurgitant fraction 49% suggests severe AI, moderate-severe MR,  non-coronary mid-wall LGE in the basal septal wall,  cannot rule out cardiac sarcoidosis (unexplained mediastinal LAN) - R/LHC (1/24): Mild nonobstructive CAD; RA mean 18, PA 50/28 (mean 37), PCWP 30, CO/CI: 3.92/ 2.27 - Post Op echo (3/24): EF 30-35%, RV moderately reduced - Echo (6/24): EF 45-50%, normal RV, moderate TR, bioprosthetic MV with mean gradient 4.5 mmHg, bioprosthetic aortic valve with mean gradient 15 mmHg.  7. Valvular heart disease:  TEE in 1/24 showed severe MR.  The valve is abnormal with thickening of the leaflet tips and poor coapatation, ?underlying rheumatic or inflammatory etiology though not classic appearance for rheumatic heart disease and no mitral stenosis. Difficult to quantify mitral regurgitation by MRI due to low stroke volume (do not think regurgitant fraction is accurate). The aortic valve is also abnormal with thickening and doming.  Aortic insufficiency was moderate by TEE though regurgitant volume by cMRI suggested severe AI (I still suspect truly in moderate range).  - s/p bioprosthetic MVR and AVR 3/24 - Echo (3/24, post-op): EF 30-35%, RV mod reduced, stable AV and MV prosthesis. 8. GERD  Review of Systems: All systems reviewed and negative except as per HPI.   Current Outpatient Medications  Medication Sig Dispense Refill   albuterol (PROVENTIL) (2.5 MG/3ML) 0.083% nebulizer solution Take 3 mLs (2.5 mg total) by nebulization every 6 (six) hours as needed for wheezing or shortness of breath. 75 mL 12   aspirin EC 81 MG tablet Take 1 tablet (81 mg total) by mouth daily. Swallow whole. 30 tablet 12   atorvastatin (LIPITOR) 80 MG tablet Take 1 tablet (80 mg total) by mouth daily at 6 PM. 30 tablet 2   busPIRone (BUSPAR) 10 MG tablet Take 10 mg by mouth 2 (two) times daily.     Cholecalciferol (VITAMIN D) 50 MCG (2000 UT) tablet Take 2,000 Units by mouth daily.     clopidogrel (PLAVIX) 75 MG tablet Take 1 tablet (75 mg total) by mouth daily. 90 tablet 3   dapagliflozin propanediol (FARXIGA) 10 MG TABS tablet Take  1 tablet (10 mg total) by mouth daily. 30 tablet 5   desloratadine (CLARINEX) 5 MG tablet Take 5 mg by mouth daily.     ezetimibe (ZETIA) 10 MG tablet Take 10 mg by mouth daily.     FLUoxetine (PROZAC) 20 MG capsule Take 20 mg by mouth daily.     fluticasone (FLONASE) 50 MCG/ACT nasal spray Place 1 spray into both nostrils daily.     fluticasone-salmeterol (WIXELA INHUB) 500-50 MCG/ACT AEPB Inhale 1 puff into the lungs in the morning and at bedtime. 60 each 6   furosemide (LASIX) 40 MG tablet Take 1 tablet (40 mg total) by mouth daily. 90 tablet 3   losartan (COZAAR) 25 MG tablet Take 1 tablet (25 mg total) by mouth at bedtime. 90 tablet 3   montelukast (SINGULAIR) 10 MG tablet Take 1 tablet (10 mg total) by mouth at bedtime. 30 tablet 11   pantoprazole (PROTONIX) 40 MG tablet Take 1 tablet (40 mg total) by mouth daily. 90 tablet 1   spironolactone (ALDACTONE) 25 MG tablet Take 1 tablet (25 mg total) by mouth daily. 90 tablet 3   No current facility-administered medications for this encounter.   Allergies  Allergen Reactions   Amoxicillin-Pot Clavulanate Itching   Lactose Intolerance (Gi) Diarrhea   Other Other (See  Comments)    08/06/2018 -    06/05/2016 -    09/15/2014 -    06/16/2014 -   Social History   Socioeconomic History   Marital status: Widowed    Spouse name: Not on file   Number of children: Not on file   Years of education: Not on file   Highest education level: Not on file  Occupational History   Not on file  Tobacco Use   Smoking status: Former    Current packs/day: 0.00    Average packs/day: 0.5 packs/day for 30.0 years (15.0 ttl pk-yrs)    Types: Cigarettes    Start date: 11/09/1968    Quit date: 11/10/1998    Years since quitting: 23.8   Smokeless tobacco: Never  Vaping Use   Vaping status: Never Used  Substance and Sexual Activity   Alcohol use: Yes    Comment: occasional   Drug use: No   Sexual activity: Not Currently  Other Topics Concern   Not on  file  Social History Narrative   Not on file   Social Determinants of Health   Financial Resource Strain: Not on File (06/07/2021)   Received from [employer], [employer]    Financial Resource Strain: 0  Food Insecurity: No Food Insecurity (05/09/2022)   Hunger Vital Sign    Worried About Running Out of Food in the Last Year: Never true    Ran Out of Food in the Last Year: Never true  Transportation Needs: No Transportation Needs (05/09/2022)   PRAPARE - Administrator, Civil Service (Medical): No    Lack of Transportation (Non-Medical): No  Physical Activity: Not on File (06/07/2021)   Received from [place], [place]   Physical Activity    Physical Activity: 0  Stress: Not on File (06/07/2021)   Received from [facility] [place], [place]   Stress    Stress: 0  Social Connections: Not on File (06/07/2021)   Received from [place], [place]   Social Connections    Social Connections and Isolation: 0  Intimate Partner Violence: Not At Risk (05/09/2022)   Humiliation, Afraid, Rape, and Kick questionnaire    Fear of Current or Ex-Partner: No    Emotionally Abused: No    Physically Abused: No    Sexually Abused: No   Family History  Problem Relation Age of Onset   Hypertension Mother    Hypertension Father    Colon cancer Father    Hypertension Sister    BP 120/78   Pulse 68   Wt 62 kg (136 lb 9.6 oz)   SpO2 99%   BMI 22.05 kg/m   Wt Readings from Last 3 Encounters:  09/16/22 62 kg (136 lb 9.6 oz)  08/05/22 62.1 kg (137 lb)  07/16/22 61.2 kg (135 lb)   PHYSICAL EXAM: General: NAD Neck: No JVD, no thyromegaly or thyroid nodule.  Lungs: Clear to auscultation bilaterally with normal respiratory effort. CV: Nondisplaced PMI.  Heart regular S1/S2, no S3/S4, 1/6 SEM RUSB.  No peripheral edema.  No carotid bruit.  Normal pedal pulses.  Abdomen: Soft, nontender, no hepatosplenomegaly, no distention.  Skin: Intact without lesions or rashes.  Neurologic: Alert and oriented x  3.  Psych: Normal affect. Extremities: No clubbing or cyanosis.  HEENT: Normal.   ASSESSMENT & PLAN: 1. Valvular heart disease: TEE showed severe MR.  The valve is abnormal with thickening of the leaflet tips and poor coapatation, ?underlying rheumatic or inflammatory etiology though not classic appearance  for rheumatic heart disease and no mitral stenosis. The aortic valve was also abnormal with thickening and doming.  Aortic insufficiency was moderate by TEE though regurgitant volume by cMRI suggested severe AI (I still suspect truly in moderate range). The structural heart team reviewed TEE images and felt she is not suitable for mTEER, so she underwent bioprosthetic MVR/AVR 05/07/22.  Echo in 6/24 showed stable bioprosthetic aortic and mitral valves.  - Will need SBE prophylaxis with dental work. - She can stop Eliquis at this point, restart combination of ASA 81 and Plavix (was on Plavix pre-op for CVA).  - Refer for cardiac rehab.  2. Chronic Systolic CHF: Echo in 3/20 showed low normal EF 50-55%, no significant MR. Echo in 1/24 EF 40-45%, mild RV dilation, moderate pulmonary HTN, mod-severe MR and moderate-severe TR. RHC/LHC (1/24) with elevated left and right heart filling pressures, pulmonary venous hypertension, preserved cardiac output.  NICM. Cause is uncertain, ? valvular disease. TEE done 03/01/22 showed EF 30-35%, mild RV dysfunction, severe MR, moderate TR, moderate AI.  Cardiac MRI 1/12 showed LV EF 18%, RV EF 25%, ? severe AI, moderate-severe MR (regurgitant fraction not likely accurate with low SV), non-coronary mid-wall LGE in the basal septal wall.  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 Post-op Echo (3/24) showed EF 30-35%, RV mod reduced. Echo in 6/24 showed improved EF 45-50%, normal RV, moderate TR, bioprosthetic MV with mean gradient 4.5 mmHg, bioprosthetic aortic valve with mean gradient 15 mmHg.  NYHA class II, not volume  overloaded on exam.  - Continue losartan 25 mg daily, BP did not tolerate Entresto in the past.  - Continue Farxiga 10 mg daily. - With EF up to 45-50%, she can stop digoxin.   - Increase spironolactone to 25 mg daily and stop KCl supplement.  - Continue Lasix 40 mg daily. - Hold off on beta blocker with 1st degree AVB. - BMET/BNP today and repeat BMET in 10 days.  3. Pulmonary hypertension: Group 2 pulmonary venous hypertension.  4. H/o TIA/CVA: Previously on Plavix long term. - Stop Eliquis, will maintain her on Plavix + ASA 81.  5. CKD stage 3:  - Continue Farxiga.  6. Accelerated junctional rhythm: Post-Op. Started on amiodarone in the hospital. - I do not think that there is an indication for amiodarone at this time, will stop it.   Follow up in 3 months with APP.   Marca Ancona 09/16/22

## 2022-09-16 NOTE — Patient Instructions (Addendum)
STOP Digoxin  STOP Potassium  STOP Amiodarone  STOP Eliquis  START Plavix 75 mg daily.  INCREASE Spironolactone to 25 mg ( 1 Tab) daily.  Labs done today, your results will be available in MyChart, we will contact you for abnormal readings.  Repeat blood work in 10 days.  Your physician recommends that you schedule a follow-up appointment in: 3 months   If you have any questions or concerns before your next appointment please send [place] a message through [place] or call our office at [phone].    TO LEAVE A MESSAGE FOR THE NURSE SELECT OPTION 2, PLEASE LEAVE A MESSAGE INCLUDING: YOUR NAME DATE OF BIRTH CALL BACK NUMBER REASON FOR CALL**this is important as we prioritize the call backs  YOU WILL RECEIVE A CALL BACK THE SAME DAY AS LONG AS YOU CALL BEFORE 4:00 PM  At the [facility], you and your health needs are our priority. As part of our continuing mission to provide you with exceptional heart care, we have created designated Provider Care Teams. These Care Teams include your primary Cardiologist (physician) and Advanced Practice Providers (APPs- Physician Assistants and Nurse Practitioners) who all work together to provide you with the care you need, when you need it.   You may see any of the following providers on your designated Care Team at your next follow up: Dr Arvilla Meres Dr Marca Ancona Dr. Marcos Eke, NP Robbie Lis, [place] [facility] [place], [place] Brynda Peon, NP Karle Plumber, PharmD   Please be sure to bring in all your medications bottles to every appointment.    Thank you for choosing [facility]

## 2022-09-25 ENCOUNTER — Ambulatory Visit (HOSPITAL_COMMUNITY)
Admission: RE | Admit: 2022-09-25 | Discharge: 2022-09-25 | Disposition: A | Discharge status: Home / Self Care | Payer: Medicare HMO | Source: Ambulatory Visit | Attending: Cardiology | Admitting: Cardiology

## 2022-09-25 ENCOUNTER — Other Ambulatory Visit (HOSPITAL_COMMUNITY): Payer: Self-pay

## 2022-09-25 DIAGNOSIS — I5022 Chronic systolic (congestive) heart failure: Secondary | ICD-10-CM | POA: Diagnosis present

## 2022-09-25 LAB — BASIC METABOLIC PANEL
Anion gap: 10 (ref 5–15)
BUN: 17 mg/dL (ref 8–23)
CO2: 25 mmol/L (ref 22–32)
Calcium: 9 mg/dL (ref 8.9–10.3)
Chloride: 105 mmol/L (ref 98–111)
Creatinine, Ser: 1.18 mg/dL — ABNORMAL HIGH (ref 0.44–1.00)
GFR, Estimated: 49 mL/min — ABNORMAL LOW (ref >60)
Glucose, Bld: 127 mg/dL — ABNORMAL HIGH (ref 70–99)
Potassium: 3.1 mmol/L — ABNORMAL LOW (ref 3.5–5.1)
Sodium: 140 mmol/L (ref 135–145)

## 2022-09-25 MED ORDER — DAPAGLIFLOZIN PROPANEDIOL 10 MG PO TABS: 10.0000 mg | ORAL_TABLET | Freq: Every day | ORAL | 11 refills | Status: DC

## 2022-09-25 NOTE — Telephone Encounter (Signed)
Meds ordered this encounter  Medications   dapagliflozin propanediol (FARXIGA) 10 MG TABS tablet    Sig: Take 1 tablet (10 mg total) by mouth daily.    Dispense:  30 tablet    Refill:  11

## 2022-09-26 ENCOUNTER — Telehealth (HOSPITAL_COMMUNITY): Payer: Self-pay

## 2022-09-26 ENCOUNTER — Other Ambulatory Visit (HOSPITAL_COMMUNITY): Payer: Medicare HMO

## 2022-09-26 DIAGNOSIS — I5022 Chronic systolic (congestive) heart failure: Secondary | ICD-10-CM

## 2022-09-26 MED ORDER — POTASSIUM CHLORIDE CRYS ER 20 MEQ PO TBCR
20.0000 meq | EXTENDED_RELEASE_TABLET | Freq: Every day | ORAL | 11 refills | Status: DC
Start: 2022-09-26 — End: 2022-10-25

## 2022-09-26 NOTE — Telephone Encounter (Signed)
-----   Message from Marca Ancona sent at 09/25/2022  8:09 PM EDT ----- Increase total daily KCl by 20 mEq. BMET 10 days.

## 2022-09-26 NOTE — Telephone Encounter (Signed)
Patient's potassium medication has been sent to her pharmacy and her med list has been updated. In addition, pt's lab order has been placed and her appointment scheduled.

## 2022-10-07 ENCOUNTER — Ambulatory Visit (HOSPITAL_COMMUNITY)
Admission: RE | Admit: 2022-10-07 | Discharge: 2022-10-07 | Disposition: A | Discharge status: Home / Self Care | Payer: Medicare HMO | Source: Ambulatory Visit | Attending: Cardiology | Admitting: Cardiology

## 2022-10-07 DIAGNOSIS — I5022 Chronic systolic (congestive) heart failure: Secondary | ICD-10-CM | POA: Diagnosis present

## 2022-10-07 LAB — BASIC METABOLIC PANEL
Anion gap: 14 (ref 5–15)
BUN: 19 mg/dL (ref 8–23)
CO2: 22 mmol/L (ref 22–32)
Calcium: 9.6 mg/dL (ref 8.9–10.3)
Chloride: 102 mmol/L (ref 98–111)
Creatinine, Ser: 1.53 mg/dL — ABNORMAL HIGH (ref 0.44–1.00)
GFR, Estimated: 36 mL/min — ABNORMAL LOW (ref >60)
Glucose, Bld: 110 mg/dL — ABNORMAL HIGH (ref 70–99)
Potassium: 4 mmol/L (ref 3.5–5.1)
Sodium: 138 mmol/L (ref 135–145)

## 2022-10-11 ENCOUNTER — Telehealth (HOSPITAL_COMMUNITY): Payer: Self-pay

## 2022-10-11 MED ORDER — FUROSEMIDE 20 MG PO TABS: 20.0000 mg | ORAL_TABLET | Freq: Every day | ORAL | 3 refills | Status: DC

## 2022-10-11 NOTE — Telephone Encounter (Signed)
Patient advised and verbalized understanding. Patient already has pending lab appointment. Med list updated to reflect changes.   Meds ordered this encounter  Medications   furosemide (LASIX) 20 MG tablet    Sig: Take 1 tablet (20 mg total) by mouth daily.    Dispense:  90 tablet    Refill:  3    Please cancel all previous orders for current medication. Change in dosage or pill size.

## 2022-10-11 NOTE — Telephone Encounter (Signed)
-----   Message from Marca Ancona sent at 10/07/2022  3:52 PM EDT ----- Decrease Lasix to 20 mg daily with elevated creatinine.  BMET in 10 days.

## 2022-10-18 ENCOUNTER — Other Ambulatory Visit: Payer: Self-pay | Admitting: Pulmonary Disease

## 2022-10-18 ENCOUNTER — Ambulatory Visit (HOSPITAL_COMMUNITY)
Admission: RE | Admit: 2022-10-18 | Discharge: 2022-10-18 | Disposition: A | Discharge status: Home / Self Care | Payer: Medicare HMO | Source: Ambulatory Visit | Attending: Cardiology | Admitting: Cardiology

## 2022-10-18 DIAGNOSIS — I5022 Chronic systolic (congestive) heart failure: Secondary | ICD-10-CM | POA: Diagnosis present

## 2022-10-18 DIAGNOSIS — K219 Gastro-esophageal reflux disease without esophagitis: Secondary | ICD-10-CM

## 2022-10-18 LAB — BASIC METABOLIC PANEL
Anion gap: 12 (ref 5–15)
BUN: 24 mg/dL — ABNORMAL HIGH (ref 8–23)
CO2: 20 mmol/L — ABNORMAL LOW (ref 22–32)
Calcium: 9.4 mg/dL (ref 8.9–10.3)
Chloride: 104 mmol/L (ref 98–111)
Creatinine, Ser: 1.67 mg/dL — ABNORMAL HIGH (ref 0.44–1.00)
GFR, Estimated: 32 mL/min — ABNORMAL LOW (ref >60)
Glucose, Bld: 103 mg/dL — ABNORMAL HIGH (ref 70–99)
Potassium: 4 mmol/L (ref 3.5–5.1)
Sodium: 136 mmol/L (ref 135–145)

## 2022-10-25 ENCOUNTER — Telehealth (HOSPITAL_COMMUNITY): Payer: Self-pay

## 2022-10-25 DIAGNOSIS — I5022 Chronic systolic (congestive) heart failure: Secondary | ICD-10-CM

## 2022-10-25 MED ORDER — FUROSEMIDE 20 MG PO TABS: 20.0000 mg | ORAL_TABLET | ORAL | Status: DC

## 2022-10-25 MED ORDER — POTASSIUM CHLORIDE CRYS ER 20 MEQ PO TBCR
20.0000 meq | EXTENDED_RELEASE_TABLET | Freq: Every day | ORAL | Status: DC
Start: 2022-10-25 — End: 2022-12-17

## 2022-10-25 MED ORDER — POTASSIUM CHLORIDE CRYS ER 20 MEQ PO TBCR
20.0000 meq | EXTENDED_RELEASE_TABLET | ORAL | Status: DC
Start: 2022-10-25 — End: 2022-10-25

## 2022-10-25 NOTE — Telephone Encounter (Signed)
-----   Message from Marca Ancona sent at 10/21/2022  8:47 PM EDT ----- Decrease Lasix to 20 mg every other day. ----- Message ----- From: Chinita Pester, CMA Sent: 10/18/2022   5:08 PM EDT To: Laurey Morale, MD  She is already taking 20mg  every day. Please advise

## 2022-10-25 NOTE — Telephone Encounter (Signed)
Patient advised and verbalized understanding. Med list updated to reflect changes. Patient will have repeat labs done at 9/19 appointment with [facility] office.   Meds ordered this encounter  Medications   DISCONTD: potassium chloride SA (KLOR-CON M20) 20 MEQ tablet    Sig: Take 1 tablet (20 mEq total) by mouth every other day.    Please cancel all previous orders for current medication. Change in dosage or pill size.   furosemide (LASIX) 20 MG tablet    Sig: Take 1 tablet (20 mg total) by mouth every other day.    Please cancel all previous orders for current medication. Change in dosage or pill size.   potassium chloride SA (KLOR-CON M20) 20 MEQ tablet    Sig: Take 1 tablet (20 mEq total) by mouth daily.    Please cancel all previous orders for current medication. Change in dosage or pill size.

## 2022-11-06 NOTE — Progress Notes (Unsigned)
Cardiology Office Note    Patient Name: Shannon [name] Date of Encounter: 11/06/2022  Primary Care Provider:  Donato Schultz, FNP Primary Cardiologist:  Nanetta Batty, MD Primary Electrophysiologist: None   Past Medical History    Past Medical History:  Diagnosis Date   Anxiety    Arthritis    "right knee" (05/26/2014)   Asthma    CHF (congestive heart failure) (HCC)    chronic diastolic heart failure - sees Dr. Shirlee Latch   Chronic lower back pain    Dysrhythmia    irregular heart beat   GERD (gastroesophageal reflux disease)    HLD (hyperlipidemia)    Hypertension    Pneumonia    Stroke [facility])    TIA - ?2021   TIA (transient ischemic attack) 04/2018    History of Present Illness   Shannon [name] is a 72 y.o. 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   Ms. Shoulders was last seen on 08/05/2022 for posthospital follow-up.  She was euvolemic on examination and was staying active walking frequently.  2D echo was completed 07/2022 showing improved EF of 45-50% with stable surgical replaced AVR and MV with mild MR and moderate TR, normal PASP with moderately dilated LA and mildly dilated RA.  She was seen by Dr. Shirlee Latch on 09/16/2022 and was doing well with no dyspnea with stable weight occasional chest discomfort at surgical site.  She was advised to stop Eliquis and continue ASA and Plavix.  She was continued on GDMT with losartan, Farxiga and spironolactone was increased to 25 mg with beta-blocker still held due to first-degree AVB.  Patient had Lasix decreased to 20 mg every other day due to elevated creatinine.  During today's visit the patient reports*** .  Patient denies chest pain, palpitations, dyspnea, PND, orthopnea, nausea, vomiting, dizziness, syncope, edema, weight gain, or early satiety.  ***Notes: -Patient will need repeat BMET  today -Last ischemic evaluation: -Last echo: -Interim ED visits: Review of Systems  Please see the history of present illness.    All other systems reviewed and are otherwise negative except as noted above.  Physical Exam    Wt Readings from Last 3 Encounters:  09/16/22 136 lb 9.6 oz (62 kg)  08/05/22 137 lb (62.1 kg)  07/16/22 135 lb (61.2 kg)   ZO:XWRUE were no vitals filed for this visit.,There is no height or weight on file to calculate BMI. GEN: Well nourished, well developed in no acute distress Neck: No JVD; No carotid bruits Pulmonary: Clear to auscultation without rales, wheezing or rhonchi  Cardiovascular: Normal rate. Regular rhythm. Normal S1. Normal S2.   Murmurs: There is no murmur.  ABDOMEN: Soft, non-tender, non-distended EXTREMITIES:  No edema; No deformity   EKG/LABS/ Recent Cardiac Studies   ECG personally reviewed by me today - ***  Risk Assessment/Calculations:   {Does this patient have ATRIAL FIBRILLATION?:[id number]}      Lab Results  Component Value Date   WBC 5.1 09/16/2022   HGB 13.8 09/16/2022   HCT 45.1 09/16/2022   MCV 92.2 09/16/2022   PLT 232 09/16/2022   Lab Results  Component Value Date   CREATININE 1.67 (H) 10/18/2022   BUN 24 (H) 10/18/2022   NA 136 10/18/2022   K 4.0 10/18/2022   CL 104 10/18/2022   CO2 20 (L) 10/18/2022   Lab Results  Component Value Date   CHOL  208 (H) 09/16/2022   HDL 89 09/16/2022   LDLCALC 106 (H) 09/16/2022   TRIG 64 09/16/2022   CHOLHDL 2.3 09/16/2022    Lab Results  Component Value Date   HGBA1C 7.1 (H) 05/03/2022   Assessment & Plan    1.Chronic systolic CHF: -Patient is euvolemic today on examination -Patient currently followed in the advanced heart failure clinic with medications being titrated  2.Severe MVR/AVR s/p replacement: -s/p successful MVR/AVR repair with Stable AV and MV prosthesis -SBE prophylaxis discussed  3.Nonobstructive coronary artery disease: -s/p coronary calcium  scoring with calcium score of 507 with aortic atherosclerosis noted and PET/CT with coronary calcifications noted. -Continue Lipitor 80 mg, ASA 81 mg  4.Pulmonary hypertension: -Group 2 pulmonary venous hypertension -Continue Lasix***  5. CKD stage IIIa: -Most recent creatinine was*** -Patient will need repeat BMET today***      Disposition: Follow-up with Nanetta Batty, MD or APP in *** months {Are you ordering a CV Procedure (e.g. stress test, cath, DCCV, TEE, etc)?   Press F2        :[id number]}   Signed, Napoleon Form, Leodis Rains, NP 11/06/2022, 6:04 PM [facility]

## 2022-11-07 ENCOUNTER — Ambulatory Visit: Payer: Medicare HMO | Attending: Nurse Practitioner | Admitting: Nurse Practitioner

## 2022-11-07 ENCOUNTER — Ambulatory Visit: Payer: Medicare HMO | Attending: Nurse Practitioner

## 2022-11-07 ENCOUNTER — Encounter: Payer: Self-pay | Admitting: Nurse Practitioner

## 2022-11-07 VITALS — BP 108/70 | HR 83 | Ht 66.0 in | Wt 140.0 lb

## 2022-11-07 DIAGNOSIS — Z9889 Other specified postprocedural states: Secondary | ICD-10-CM | POA: Diagnosis not present

## 2022-11-07 DIAGNOSIS — R000 Tachycardia, unspecified: Secondary | ICD-10-CM

## 2022-11-07 DIAGNOSIS — G459 Transient cerebral ischemic attack, unspecified: Secondary | ICD-10-CM

## 2022-11-07 DIAGNOSIS — I5022 Chronic systolic (congestive) heart failure: Secondary | ICD-10-CM | POA: Diagnosis not present

## 2022-11-07 DIAGNOSIS — N183 Chronic kidney disease, stage 3 unspecified: Secondary | ICD-10-CM

## 2022-11-07 DIAGNOSIS — Z952 Presence of prosthetic heart valve: Secondary | ICD-10-CM | POA: Diagnosis not present

## 2022-11-07 DIAGNOSIS — I2510 Atherosclerotic heart disease of native coronary artery without angina pectoris: Secondary | ICD-10-CM

## 2022-11-07 DIAGNOSIS — K625 Hemorrhage of anus and rectum: Secondary | ICD-10-CM

## 2022-11-07 LAB — CBC
Hematocrit: 40.8 % (ref 34.0–46.6)
Hemoglobin: 13.4 g/dL (ref 11.1–15.9)
MCH: 30 pg (ref 26.6–33.0)
MCHC: 32.8 g/dL (ref 31.5–35.7)
MCV: 91 fL (ref 79–97)
Platelets: 331 10*3/uL (ref 150–450)
RBC: 4.47 x10E6/uL (ref 3.77–5.28)
RDW: 17.5 % — ABNORMAL HIGH (ref 11.7–15.4)
WBC: 7.1 10*3/uL (ref 3.4–10.8)

## 2022-11-07 NOTE — Telephone Encounter (Signed)
The patient has been notified of the result and verbalized understanding.  All questions (if any) were answered. Ethelda Chick, RN 11/07/2022 5:07 PM

## 2022-11-07 NOTE — Progress Notes (Unsigned)
Enrolled for [employer] to mail a ZIO XT long term holter monitor to the patients address on file.   Dr. Allyson Sabal to read.

## 2022-11-07 NOTE — Telephone Encounter (Signed)
Please contact Ms Hackel and let her know that blood counts are stable.  Please see result note.)Please advise her to monitor for increased bleeding in her stool and to contact us if she continue to have bleeding or feels any fatigue or SOB.  Thanks, Alden Server

## 2022-11-07 NOTE — Patient Instructions (Signed)
Medication Instructions:  The current medical regimen is effective;  continue present plan and medications as directed. Please refer to the Current Medication list given to you today.  *If you need a refill on your cardiac medications before your next appointment, please call your pharmacy*  Lab Work: STAT CBC WITH BMET TODAY If you have labs (blood work) drawn today and your tests are completely normal, you will receive your results only by:  MyChart Message (if you have MyChart) OR  A paper copy in the mail If you have any lab test that is abnormal or we need to change your treatment, we will call you to review the results.  Testing/Procedures: Your physician has requested you wear a ZIO patch monitor for 7 days-SEE BELOW  Other Instructions PLEASE READ AND FOLLOW ATTACHED LOW SODIUM HEART HEALTHY DIET MAY USE DULCOLAX OR METAMUCIL WEIGH DAILY AND CALL IF WEIGHT IS UP 3 POUNDS IN A DAY  Follow-Up: At [facility], you and your health needs are our priority.  As part of our continuing mission to provide you with exceptional heart care, we have created designated Provider Care Teams.  These Care Teams include your primary Cardiologist (physician) and Advanced Practice Providers (APPs -  Physician Assistants and Nurse Practitioners) who all work together to provide you with the care you need, when you need it.  Your next appointment:   6 month(s)  Provider:   Nanetta Batty, MD      Christena Deem- Long Term Monitor Instructions  Your physician has requested you wear a ZIO patch monitor for 7 days.  This is a single patch monitor. [employer] supplies one patch monitor per enrollment. Additional stickers are not available. Please do not apply patch if you will be having a Nuclear Stress Test,  Echocardiogram, Cardiac CT, MRI, or Chest Xray during the period you would be wearing the  monitor. The patch cannot be worn during these tests. You cannot remove and re-apply the  ZIO XT patch  monitor.  Your ZIO patch monitor will be mailed 3 day USPS to your address on file. It may take 3-5 days  to receive your monitor after you have been enrolled.  Once you have received your monitor, please review the enclosed instructions. Your monitor  has already been registered assigning a specific monitor serial # to you.  Billing and Patient Assistance Program Information  We have supplied [employer] with any of your insurance information on file for billing purposes. [employer] offers a sliding scale Patient Assistance Program for patients that do not have  insurance, or whose insurance does not completely cover the cost of the ZIO monitor.  You must apply for the Patient Assistance Program to qualify for this discounted rate.  To apply, please call [employer] at [phone], select option 4, select option 2, ask to apply for  Patient Assistance Program. Meredeth Ide will ask your household income, and how many people  are in your household. They will quote your out-of-pocket cost based on that information.  [employer] will also be able to set up a 66-month, interest-free payment plan if needed.  Applying the monitor   Shave hair from upper left chest.  Hold abrader disc by orange tab. Rub abrader in 40 strokes over the upper left chest as  indicated in your monitor instructions.  Clean area with 4 enclosed alcohol pads. Let dry.  Apply patch as indicated in monitor instructions. Patch will be placed under collarbone on left  side of chest with arrow  pointing upward.  Rub patch adhesive wings for 2 minutes. Remove white label marked "1". Remove the white  label marked "2". Rub patch adhesive wings for 2 additional minutes.  While looking in a mirror, press and release button in center of patch. A small green light will  flash 3-4 times. This will be your only indicator that the monitor has been turned on.  Do not shower for the first 24 hours. You may shower after the first 24 hours.  Press the  button if you feel a symptom. You will hear a small click. Record Date, Time and  Symptom in the Patient Logbook.  When you are ready to remove the patch, follow instructions on the last 2 pages of Patient  Logbook. Stick patch monitor onto the last page of Patient Logbook.  Place Patient Logbook in the blue and white box. Use locking tab on box and tape box closed  securely. The blue and white box has prepaid postage on it. Please place it in the mailbox as  soon as possible. Your physician should have your test results approximately 7 days after the  monitor has been mailed back to [facility].  Call [facility] Customer Care at [phone] if you have questions regarding  your ZIO XT patch monitor. Call them immediately if you see an orange light blinking on your  monitor.  If your monitor falls off in less than 4 days, contact our Monitor department at [phone].  If your monitor becomes loose or falls off after 4 days call [employer] at [phone] for  suggestions on securing your monitor

## 2022-11-08 LAB — BASIC METABOLIC PANEL
BUN/Creatinine Ratio: 16 (ref 12–28)
BUN: 22 mg/dL (ref 8–27)
CO2: 20 mmol/L (ref 20–29)
Calcium: 9.9 mg/dL (ref 8.7–10.3)
Chloride: 102 mmol/L (ref 96–106)
Creatinine, Ser: 1.41 mg/dL — ABNORMAL HIGH (ref 0.57–1.00)
Glucose: 84 mg/dL (ref 70–99)
Potassium: 4.9 mmol/L (ref 3.5–5.2)
Sodium: 141 mmol/L (ref 134–144)
eGFR: 39 mL/min/{1.73_m2} — ABNORMAL LOW (ref >59)

## 2022-11-12 DIAGNOSIS — R000 Tachycardia, unspecified: Secondary | ICD-10-CM

## 2022-12-06 ENCOUNTER — Telehealth: Payer: Self-pay | Admitting: Cardiovascular Disease

## 2022-12-06 NOTE — Telephone Encounter (Signed)
Call pt to inform her that she needed to contact her PCP to get a refill on medication Buspirone, because this medication is not a cardiac medication. I advised pt if she has any other problems, questions or concerns, to give our office a call back. Pt verbalized understanding.

## 2022-12-06 NOTE — Telephone Encounter (Signed)
*  STAT* If patient is at the pharmacy, call can be transferred to refill team.   1. Which medications need to be refilled? (please list name of each medication and dose if known)   busPIRone (BUSPAR) 10 MG tablet     2. Would you like to learn more about the convenience, safety, & potential cost savings by using the [facility]?     3. Are you open to using the [facility] (Type [facility].   4. Which pharmacy/location (including street and city if local pharmacy) is medication to be sent to?  [id number] - [place], [place] - [address]     5. Do they need a 30 day or 90 day supply? 90 day    Patient is completely out of medication.

## 2022-12-16 NOTE — Progress Notes (Signed)
ADVANCED HF CLINIC NOTE   Primary Care: Donato Schultz, FNP HF Cardiologist: Dr. Shirlee Latch  HPI: 72 y.o. female w/ h/o chronic diastolic heart failure, HTN, HLD, h/o TIA, GERD and asthma. Former smoker (quit 72 y/o). Noted to have high coronary calcium score of 507 in March 2022. Referred to lipid clinic for aggressive tx of HLD but did not undergo cath in absence of symptoms. Echo 04/2018 showed normal LVEF and RV. No significant valvular dysfunction.    Recently referred back to cardiology for surgical clearance prior to undergoing planned EGD and EBUS (ordered given progressive dysphagia, wt loss and abnormal chest CT). She endorsed exertional dyspnea w/ inability to complete of activity w/o symptoms. Echo was done and showed drop in EF down to 40-45% w/ diffuse HK, severe LAE mod-severe MR, severely elevated RVSP, RV normal, mod-severe RAE and mod-severe TR. Subsequently referred for [facility].    [facility] 1/24 showed no significant CAD, only 10% pRCA narrowing, elevated R+ L heart filling pressures and marginal output, (PA saturation 48%, mean RA pressure 18 mmHg, PA pressure 50/28, mean PA pressure 37 mmHg, pulmonary capillary wedge pressure 35/36, mean pulmonary capillary wedge pressure 30 mmHg, cardiac output 3.92 L/min, cardiac index 2.27).    She was direct admitted from the cath lab for IV diuresis. TEE showed EF 30-35%, mild RV dysfunction, severe MR, moderate TR, moderate AI.  Cardiac MRI showed LV EF 18%, RV EF 25%, ?severe AI, moderate-severe MR (regurgitant fraction not likely accurate with low SV), non-coronary mid-wall LGE in the basal septal wall.  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  She had dysphagia and underwent EGD/colonoscopy showing esophageal candidiasis, unremarkable colonoscopy. Hospitalization also complicated by episode of SVT requiring adenosine. Her GDMT was titrated, but limited by soft BP. Structural Heart  team to review TEE to decide on mTEER candidacy. She was discharged home, weight 134 lbs.  Admitted 3/24 for scheduled MVR/AVR for severe MR, moderate AI and TR. S/p successful bioprosthetic MVR/AVR. AHF consulted for management of HF. Post op echo in 3/24 showed EF 30-35%, RV mod reduced, stable AV and MV prosthesis. After pressors/inotropes weaned off, GDMT titrated but limited by soft BPs. Had accelerated junction rhythm after surgery and started on amiodarone. She was discharged home, weight 137.5 lbs.  Echo in 6/24 showed EF 45-50%, normal RV, moderate TR, bioprosthetic MV with mean gradient 4.5 mmHg, bioprosthetic aortic valve with mean gradient 15 mmHg.   Today she returns for followup of CHF.  No dyspnea unless lifting something heavy.  Weight stable.  No BRBPR/melena. Ok walking up a flight of stairs. Occasional chest pain at surgical site with stretching of chest. No palpitations.   ECG (personally reviewed): NSR 1st degree AVB, LVH  Labs (1/24): K 4.4, creatinine 1.42, AST 85, ALT 73 Labs (1/24): K 3.6, creatinine 2.03 Labs (4/24): K 3.3, creatinine 1.02, Mg 1.9, TSH 1.9 Labs (5/24): K 4.2, Scr 1.22, hgb 12 Labs (6/24): K 3.5, creatinine 1.35  PMH: 1. 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  2. COPD: Prior smoker.  3. Mediastinal lymphadenopathy: PET-CT with hypermetabolic lymphadenopathy, ?lymphoproliferative disorder vs primary bronchogenic carcinoma vs sarcoidosis.  4. CKD stage 3 5. SVT: Occurred in 1/24, required adenosine.  6. Chronic systolic CHF:  Nonischemic cardiomyopathy.  - Echo (3/20): EF 50-55%, RV normal, mild MR - Echo (1/24): EF 40-45%, Severe LAE, mod-severe MR, severely elevated RVSP, RV normal, mod-severe RAE, mod-severe TR  - TEE (1/24): EF 30-35%, severe MR,  moderate TR, moderate AI - Cardiac MRI (1/24): LVEF 18%, RVEF 25%, moderate AI visually but regurgitant fraction 49% suggests severe AI, moderate-severe MR,  non-coronary mid-wall LGE in the basal septal wall,  cannot rule out cardiac sarcoidosis (unexplained mediastinal LAN) - R/LHC (1/24): Mild nonobstructive CAD; RA mean 18, PA 50/28 (mean 37), PCWP 30, CO/CI: 3.92/ 2.27 - Post Op echo (3/24): EF 30-35%, RV moderately reduced - Echo (6/24): EF 45-50%, normal RV, moderate TR, bioprosthetic MV with mean gradient 4.5 mmHg, bioprosthetic aortic valve with mean gradient 15 mmHg.  7. Valvular heart disease:  TEE in 1/24 showed severe MR.  The valve is abnormal with thickening of the leaflet tips and poor coapatation, ?underlying rheumatic or inflammatory etiology though not classic appearance for rheumatic heart disease and no mitral stenosis. Difficult to quantify mitral regurgitation by MRI due to low stroke volume (do not think regurgitant fraction is accurate). The aortic valve is also abnormal with thickening and doming.  Aortic insufficiency was moderate by TEE though regurgitant volume by cMRI suggested severe AI (I still suspect truly in moderate range).  - s/p bioprosthetic MVR and AVR 3/24 - Echo (3/24, post-op): EF 30-35%, RV mod reduced, stable AV and MV prosthesis. 8. GERD  Review of Systems: All systems reviewed and negative except as per HPI.   Current Outpatient Medications  Medication Sig Dispense Refill   albuterol (PROVENTIL) (2.5 MG/3ML) 0.083% nebulizer solution Take 3 mLs (2.5 mg total) by nebulization every 6 (six) hours as needed for wheezing or shortness of breath. 75 mL 12   aspirin EC 81 MG tablet Take 1 tablet (81 mg total) by mouth daily. Swallow whole. 30 tablet 12   atorvastatin (LIPITOR) 80 MG tablet Take 1 tablet (80 mg total) by mouth daily at 6 PM. 30 tablet 2   busPIRone (BUSPAR) 10 MG tablet Take 10 mg by mouth 2 (two) times daily.     Cholecalciferol (VITAMIN D) 50 MCG (2000 UT) tablet Take 2,000 Units by mouth daily.     clopidogrel (PLAVIX) 75 MG tablet Take 1 tablet (75 mg total) by mouth daily. 90 tablet 3   dapagliflozin propanediol (FARXIGA) 10 MG TABS tablet Take  1 tablet (10 mg total) by mouth daily. 30 tablet 11   desloratadine (CLARINEX) 5 MG tablet Take 5 mg by mouth daily.     ezetimibe (ZETIA) 10 MG tablet Take 10 mg by mouth daily.     FLUoxetine (PROZAC) 20 MG capsule Take 20 mg by mouth daily.     fluticasone (FLONASE) 50 MCG/ACT nasal spray Place 1 spray into both nostrils daily.     fluticasone-salmeterol (WIXELA INHUB) 500-50 MCG/ACT AEPB Inhale 1 puff into the lungs in the morning and at bedtime. 60 each 6   furosemide (LASIX) 20 MG tablet Take 10 mg by mouth every other day. Pt takes 10 mg 1/2 tablet every other day.     losartan (COZAAR) 25 MG tablet Take 1 tablet (25 mg total) by mouth at bedtime. 90 tablet 3   montelukast (SINGULAIR) 10 MG tablet Take 1 tablet (10 mg total) by mouth at bedtime. 30 tablet 11   pantoprazole (PROTONIX) 40 MG tablet TAKE 1 TABLET BY MOUTH EVERY DAY 90 tablet 1   potassium chloride SA (KLOR-CON M20) 20 MEQ tablet Take 1 tablet (20 mEq total) by mouth daily.     spironolactone (ALDACTONE) 25 MG tablet Take 1 tablet (25 mg total) by mouth daily. 90 tablet 3   No current facility-administered  medications for this visit.   Allergies  Allergen Reactions   Amoxicillin-Pot Clavulanate Itching   Lactose Intolerance (Gi) Diarrhea   Other Other (See Comments)    08/06/2018 -    06/05/2016 -    09/15/2014 -    06/16/2014 -   Social History   Socioeconomic History   Marital status: Widowed    Spouse name: Not on file   Number of children: Not on file   Years of education: Not on file   Highest education level: Not on file  Occupational History   Not on file  Tobacco Use   Smoking status: Former    Current packs/day: 0.00    Average packs/day: 0.5 packs/day for 30.0 years (15.0 ttl pk-yrs)    Types: Cigarettes    Start date: 11/09/1968    Quit date: 11/10/1998    Years since quitting: 24.1   Smokeless tobacco: Never  Vaping Use   Vaping status: Never Used  Substance and Sexual Activity   Alcohol use:  Yes    Comment: occasional   Drug use: No   Sexual activity: Not Currently  Other Topics Concern   Not on file  Social History Narrative   Not on file   Social Determinants of Health   Financial Resource Strain: Not on File (06/07/2021)   Received from [employer], [employer]    Financial Resource Strain: 0  Food Insecurity: Not on File (11/14/2022)   Received from [employer] Insecurity    Food: 0  Transportation Needs: No Transportation Needs (05/09/2022)   PRAPARE - Administrator, Civil Service (Medical): No    Lack of Transportation (Non-Medical): No  Physical Activity: Not on File (06/07/2021)   Received from [place], [place]   Physical Activity    Physical Activity: 0  Stress: Not on File (06/07/2021)   Received from [facility], [place]   Stress    Stress: 0  Social Connections: Not on File (11/02/2022)   Received from [facility]   Social Connections    Connectedness: 0  Intimate Partner Violence: Not At Risk (05/09/2022)   Humiliation, Afraid, Rape, and Kick questionnaire    Fear of Current or Ex-Partner: No    Emotionally Abused: No    Physically Abused: No    Sexually Abused: No   Family History  Problem Relation Age of Onset   Hypertension Mother    Hypertension Father    Colon cancer Father    Hypertension Sister    There were no vitals taken for this visit.  Wt Readings from Last 3 Encounters:  11/07/22 63.5 kg (140 lb)  09/16/22 62 kg (136 lb 9.6 oz)  08/05/22 62.1 kg (137 lb)   PHYSICAL EXAM: General: NAD Neck: No JVD, no thyromegaly or thyroid nodule.  Lungs: Clear to auscultation bilaterally with normal respiratory effort. CV: Nondisplaced PMI.  Heart regular S1/S2, no S3/S4, 1/6 SEM RUSB.  No peripheral edema.  No carotid bruit.  Normal pedal pulses.  Abdomen: Soft, nontender, no hepatosplenomegaly, no distention.  Skin: Intact without lesions or rashes.  Neurologic: Alert and oriented x 3.  Psych: Normal affect. Extremities: No  clubbing or cyanosis.  HEENT: Normal.   ASSESSMENT & PLAN: 1. Valvular heart disease: TEE showed severe MR.  The valve is abnormal with thickening of the leaflet tips and poor coapatation, ?underlying rheumatic or inflammatory etiology though not classic appearance for rheumatic heart disease and no mitral stenosis. The aortic valve was also abnormal  with thickening and doming.  Aortic insufficiency was moderate by TEE though regurgitant volume by cMRI suggested severe AI (I still suspect truly in moderate range). The structural heart team reviewed TEE images and felt she is not suitable for mTEER, so she underwent bioprosthetic MVR/AVR 05/07/22.  Echo in 6/24 showed stable bioprosthetic aortic and mitral valves.  - Will need SBE prophylaxis with dental work. - She can stop Eliquis at this point, restart combination of ASA 81 and Plavix (was on Plavix pre-op for CVA).  - Refer for cardiac rehab.  2. Chronic Systolic CHF: Echo in 3/20 showed low normal EF 50-55%, no significant MR. Echo in 1/24 EF 40-45%, mild RV dilation, moderate pulmonary HTN, mod-severe MR and moderate-severe TR. RHC/LHC (1/24) with elevated left and right heart filling pressures, pulmonary venous hypertension, preserved cardiac output.  NICM. Cause is uncertain, ? valvular disease. TEE done 03/01/22 showed EF 30-35%, mild RV dysfunction, severe MR, moderate TR, moderate AI.  Cardiac MRI 1/12 showed LV EF 18%, RV EF 25%, ? severe AI, moderate-severe MR (regurgitant fraction not likely accurate with low SV), non-coronary mid-wall LGE in the basal septal wall.  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 Post-op Echo (3/24) showed EF 30-35%, RV mod reduced. Echo in 6/24 showed improved EF 45-50%, normal RV, moderate TR, bioprosthetic MV with mean gradient 4.5 mmHg, bioprosthetic aortic valve with mean gradient 15 mmHg.  NYHA class II, not volume overloaded on exam.  - Continue losartan 25 mg  daily, BP did not tolerate Entresto in the past.  - Continue Farxiga 10 mg daily. - With EF up to 45-50%, she can stop digoxin.   - Increase spironolactone to 25 mg daily and stop KCl supplement.  - Continue Lasix 40 mg daily. - Hold off on beta blocker with 1st degree AVB. - BMET/BNP today and repeat BMET in 10 days.  3. Pulmonary hypertension: Group 2 pulmonary venous hypertension.  4. H/o TIA/CVA: Previously on Plavix long term. - Stop Eliquis, will maintain her on Plavix + ASA 81.  5. CKD stage 3:  - Continue Farxiga.  6. Accelerated junctional rhythm: Post-Op. Started on amiodarone in the hospital. - I do not think that there is an indication for amiodarone at this time, will stop it.   Follow up in 3 months with APP.   Anderson [facility] 12/16/22

## 2022-12-17 ENCOUNTER — Other Ambulatory Visit (HOSPITAL_COMMUNITY): Payer: Self-pay | Admitting: Family Medicine

## 2022-12-17 ENCOUNTER — Encounter (HOSPITAL_COMMUNITY): Payer: Self-pay

## 2022-12-17 ENCOUNTER — Ambulatory Visit (HOSPITAL_COMMUNITY)
Admission: RE | Admit: 2022-12-17 | Discharge: 2022-12-17 | Disposition: A | Discharge status: Home / Self Care | Payer: Medicare HMO | Source: Ambulatory Visit | Attending: Family Medicine | Admitting: Family Medicine

## 2022-12-17 VITALS — BP 118/84 | HR 83 | Wt 140.0 lb

## 2022-12-17 DIAGNOSIS — I428 Other cardiomyopathies: Secondary | ICD-10-CM | POA: Diagnosis not present

## 2022-12-17 DIAGNOSIS — Z8673 Personal history of transient ischemic attack (TIA), and cerebral infarction without residual deficits: Secondary | ICD-10-CM | POA: Diagnosis not present

## 2022-12-17 DIAGNOSIS — I110 Hypertensive heart disease with heart failure: Secondary | ICD-10-CM | POA: Diagnosis present

## 2022-12-17 DIAGNOSIS — N183 Chronic kidney disease, stage 3 unspecified: Secondary | ICD-10-CM | POA: Insufficient documentation

## 2022-12-17 DIAGNOSIS — I498 Other specified cardiac arrhythmias: Secondary | ICD-10-CM

## 2022-12-17 DIAGNOSIS — I272 Pulmonary hypertension, unspecified: Secondary | ICD-10-CM

## 2022-12-17 DIAGNOSIS — J45909 Unspecified asthma, uncomplicated: Secondary | ICD-10-CM | POA: Diagnosis not present

## 2022-12-17 DIAGNOSIS — K219 Gastro-esophageal reflux disease without esophagitis: Secondary | ICD-10-CM | POA: Insufficient documentation

## 2022-12-17 DIAGNOSIS — I5022 Chronic systolic (congestive) heart failure: Secondary | ICD-10-CM | POA: Insufficient documentation

## 2022-12-17 DIAGNOSIS — Z7902 Long term (current) use of antithrombotics/antiplatelets: Secondary | ICD-10-CM | POA: Insufficient documentation

## 2022-12-17 DIAGNOSIS — I130 Hypertensive heart and chronic kidney disease with heart failure and stage 1 through stage 4 chronic kidney disease, or unspecified chronic kidney disease: Secondary | ICD-10-CM | POA: Insufficient documentation

## 2022-12-17 DIAGNOSIS — Z952 Presence of prosthetic heart valve: Secondary | ICD-10-CM | POA: Diagnosis not present

## 2022-12-17 DIAGNOSIS — Z79899 Other long term (current) drug therapy: Secondary | ICD-10-CM | POA: Diagnosis not present

## 2022-12-17 DIAGNOSIS — E785 Hyperlipidemia, unspecified: Secondary | ICD-10-CM | POA: Diagnosis present

## 2022-12-17 DIAGNOSIS — Z9889 Other specified postprocedural states: Secondary | ICD-10-CM

## 2022-12-17 DIAGNOSIS — Z87891 Personal history of nicotine dependence: Secondary | ICD-10-CM | POA: Diagnosis not present

## 2022-12-17 DIAGNOSIS — I5032 Chronic diastolic (congestive) heart failure: Secondary | ICD-10-CM | POA: Diagnosis present

## 2022-12-17 DIAGNOSIS — I340 Nonrheumatic mitral (valve) insufficiency: Secondary | ICD-10-CM | POA: Insufficient documentation

## 2022-12-17 DIAGNOSIS — G459 Transient cerebral ischemic attack, unspecified: Secondary | ICD-10-CM

## 2022-12-17 DIAGNOSIS — I2722 Pulmonary hypertension due to left heart disease: Secondary | ICD-10-CM | POA: Diagnosis not present

## 2022-12-17 LAB — BASIC METABOLIC PANEL
Anion gap: 8 (ref 5–15)
BUN: 26 mg/dL — ABNORMAL HIGH (ref 8–23)
CO2: 21 mmol/L — ABNORMAL LOW (ref 22–32)
Calcium: 9.1 mg/dL (ref 8.9–10.3)
Chloride: 110 mmol/L (ref 98–111)
Creatinine, Ser: 1.94 mg/dL — ABNORMAL HIGH (ref 0.44–1.00)
GFR, Estimated: 27 mL/min — ABNORMAL LOW (ref >60)
Glucose, Bld: 104 mg/dL — ABNORMAL HIGH (ref 70–99)
Potassium: 4.9 mmol/L (ref 3.5–5.1)
Sodium: 139 mmol/L (ref 135–145)

## 2022-12-17 LAB — BRAIN NATRIURETIC PEPTIDE: B Natriuretic Peptide: 109.9 pg/mL — ABNORMAL HIGH (ref 0.0–100.0)

## 2022-12-17 MED ORDER — FUROSEMIDE 20 MG PO TABS: 20.0000 mg | ORAL_TABLET | ORAL | 3 refills | Status: AC | PRN

## 2022-12-17 MED ORDER — POTASSIUM CHLORIDE CRYS ER 20 MEQ PO TBCR
20.0000 meq | EXTENDED_RELEASE_TABLET | ORAL | Status: DC
Start: 2022-12-17 — End: 2023-09-16

## 2022-12-17 NOTE — Patient Instructions (Addendum)
Thank you for coming in today  If you had labs drawn today, any labs that are abnormal the clinic will call you No news is good news  Medications: RESTARt farxiga 10 mg daily Change lasix to 20 mg For weight gain of 3 lbs in 24 hours or 5 lbs in a week Change Potassium to 20 meq as needed when taking lasix dose  Follow up appointments:  Your physician recommends that you schedule a follow-up appointment in:  3-4 months With Dr. Earlean Shawl will receive a reminder letter in the mail a few months in advance. If you don't receive a letter, please call our office to schedule the follow-up appointment.     Do the following things EVERYDAY: Weigh yourself in the morning before breakfast. Write it down and keep it in a log. Take your medicines as prescribed Eat low salt foods--Limit salt (sodium) to 2000 mg per day.  Stay as active as you can everyday Limit all fluids for the day to less than 2 liters   At the [facility], you and your health needs are our priority. As part of our continuing mission to provide you with exceptional heart care, we have created designated Provider Care Teams. These Care Teams include your primary Cardiologist (physician) and Advanced Practice Providers (APPs- Physician Assistants and Nurse Practitioners) who all work together to provide you with the care you need, when you need it.   You may see any of the following providers on your designated Care Team at your next follow up: Dr Arvilla Meres Dr Marca Ancona Dr. Marcos Eke, NP Robbie Lis, [place] [facility], [place] Brynda Peon, NP Karle Plumber, PharmD   Please be sure to bring in all your medications bottles to every appointment.    Thank you for choosing [facility]  If you have any questions or concerns before your next appointment please send [place] a message through [place]a or call our office at  [phone].    TO LEAVE A MESSAGE FOR THE NURSE SELECT OPTION 2, PLEASE LEAVE A MESSAGE INCLUDING: YOUR NAME DATE OF BIRTH CALL BACK NUMBER REASON FOR CALL**this is important as we prioritize the call backs  YOU WILL RECEIVE A CALL BACK THE SAME DAY AS LONG AS YOU CALL BEFORE 4:00 PM

## 2022-12-18 ENCOUNTER — Telehealth (HOSPITAL_COMMUNITY): Payer: Self-pay

## 2022-12-18 DIAGNOSIS — I5022 Chronic systolic (congestive) heart failure: Secondary | ICD-10-CM

## 2022-12-18 NOTE — Telephone Encounter (Addendum)
Patient's labs has been ordered and appointment scheduled. Pt aware, agreeable, and verbalized understanding.   Message from Jacklynn Ganong sent at 12/17/2022  5:05 PM EDT ----- No change to plan discussed at visit.  Repeat BMET in 10 days

## 2022-12-20 ENCOUNTER — Other Ambulatory Visit: Payer: Self-pay | Admitting: Family

## 2022-12-20 ENCOUNTER — Encounter: Payer: Self-pay | Admitting: Family

## 2022-12-20 DIAGNOSIS — Z13820 Encounter for screening for osteoporosis: Secondary | ICD-10-CM

## 2022-12-24 ENCOUNTER — Other Ambulatory Visit: Payer: Self-pay | Admitting: Family

## 2022-12-24 ENCOUNTER — Ambulatory Visit
Admission: RE | Admit: 2022-12-24 | Discharge: 2022-12-24 | Disposition: A | Discharge status: Home / Self Care | Payer: Medicare HMO | Source: Ambulatory Visit | Attending: Family | Admitting: Family

## 2022-12-24 DIAGNOSIS — M545 Low back pain, unspecified: Secondary | ICD-10-CM

## 2022-12-27 ENCOUNTER — Ambulatory Visit (HOSPITAL_COMMUNITY)
Admission: RE | Admit: 2022-12-27 | Discharge: 2022-12-27 | Disposition: A | Discharge status: Home / Self Care | Payer: Medicare HMO | Source: Ambulatory Visit | Attending: Cardiology | Admitting: Cardiology

## 2022-12-27 DIAGNOSIS — I5022 Chronic systolic (congestive) heart failure: Secondary | ICD-10-CM | POA: Insufficient documentation

## 2022-12-27 LAB — BASIC METABOLIC PANEL
Anion gap: 8 (ref 5–15)
BUN: 30 mg/dL — ABNORMAL HIGH (ref 8–23)
CO2: 21 mmol/L — ABNORMAL LOW (ref 22–32)
Calcium: 9 mg/dL (ref 8.9–10.3)
Chloride: 109 mmol/L (ref 98–111)
Creatinine, Ser: 1.88 mg/dL — ABNORMAL HIGH (ref 0.44–1.00)
GFR, Estimated: 28 mL/min — ABNORMAL LOW (ref >60)
Glucose, Bld: 83 mg/dL (ref 70–99)
Potassium: 4.4 mmol/L (ref 3.5–5.1)
Sodium: 138 mmol/L (ref 135–145)

## 2023-01-02 ENCOUNTER — Ambulatory Visit: Payer: Medicare HMO | Admitting: Physician Assistant

## 2023-01-02 DIAGNOSIS — M5441 Lumbago with sciatica, right side: Secondary | ICD-10-CM

## 2023-01-02 DIAGNOSIS — G8929 Other chronic pain: Secondary | ICD-10-CM

## 2023-01-02 MED ORDER — METHYLPREDNISOLONE 4 MG PO TBPK: ORAL_TABLET | ORAL | 0 refills | Status: DC

## 2023-01-02 NOTE — Progress Notes (Signed)
Office Visit Note   Patient: Shannon [name]           Date of Birth: 1950-06-30           MRN: [id number] Visit Date: 01/02/2023              Requested by: Donato Schultz, FNP [address] [place],  [place] [address] PCP: Donato Schultz, FNP   Assessment & Plan: Visit Diagnoses:  1. Chronic right-sided low back pain with right-sided sciatica     Plan: Shannon [name] is a pleasant 72 year old woman with a long history of right lower back pain and sciatica going down her right leg.  Unfortunately this is gotten significantly worse over the last few weeks.  She has never had surgery she does not know if she has had any particular injury.  She did see her primary care physician who tried over the "to her ibuprofen.  Pain is exacerbated by sitting or lying on her right side.  Denies any loss of bowel or bladder control.  She is extremely uncomfortable but neurovascularly intact and neurologically intact.  She is only tried a steroid shot I am to try her on a steroid taper.  In the meantime I think she would benefit from some physical therapy and an MRI of her back.  She has had previous injections they have not been as helpful at all.  I think at this point she has limited options she is even tried chiropractic care.  She has significant degenerative changes in her spine.  I will refer her to Dr. Christell Constant for evaluation  Follow-Up Instructions: No follow-ups on file.   Orders:  Orders Placed This Encounter  Procedures   MR Lumbar Spine w/o contrast   Ambulatory referral to Physical Therapy   Ambulatory referral to Orthopedic Surgery   Meds ordered this encounter  Medications   methylPREDNISolone (MEDROL DOSEPAK) 4 MG TBPK tablet    Sig: Take as directed with food    Dispense:  21 tablet    Refill:  0      Procedures: No procedures performed   Clinical Data: No additional findings.   Subjective: Chief Complaint  Patient presents with   Lower Back - Pain     HPI Pleasant 72 year old woman presents today with a history of right lower back pain and sciatica.  Comes in today with a 3-week history of increasing pain.  Has tried ibuprofen as well as steroid shot in her hip.  Without much avail or help.  She is also tried chiropractic care and has had steroid injections in her back as well without relief.  Denies any loss of bowel or bladder control Review of Systems  All other systems reviewed and are negative.    Objective: Vital Signs: There were no vitals taken for this visit.  Physical Exam Constitutional:      Appearance: Normal appearance.  Skin:    General: Skin is warm and dry.  Neurological:     General: No focal deficit present.     Mental Status: She is alert.  Psychiatric:        Mood and Affect: Mood normal.        Behavior: Behavior normal.    Ortho Exam Obviously very uncomfortable she has full extension flexion of her legs.  Note no tenderness in her hip she is focally tender in lower back good to her foot.  She her strength is 5 out of 5 limited only by  pain.  Pain with sitting on her right side Specialty Comments:  No specialty comments available.  Imaging: No results found.   PMFS History: Patient Active Problem List   Diagnosis Date Noted   Hyponatremia 05/16/2022   Acute gout of right ankle 05/16/2022   S/P AVR (aortic valve replacement) 05/07/2022   Adenopathy 04/03/2022   Mitral regurgitation 02/28/2022   Acute on chronic combined systolic and diastolic CHF (congestive heart failure) (HCC) 02/28/2022   COPD with acute exacerbation (HCC)    Acute respiratory failure with hypoxia (HCC)    Nodule of upper lobe of left lung    Asthma exacerbation 06/19/2021   Community acquired pneumonia 06/19/2021   OSA (obstructive sleep apnea) 03/29/2020   Palpitations 08/17/2019   TIA (transient ischemic attack) 05/04/2018   Chest pain 05/26/2014   AKI (acute kidney injury) (HCC) 05/26/2014   Pain in the chest  05/26/2014   Diarrhea 05/26/2014   Alcohol intoxication (HCC)    Hypokalemia    G E R D 07/02/2006   Dysphagia 07/02/2006   HYPERCHOLESTEROLEMIA 04/17/2006   HYPERTENSION, BENIGN SYSTEMIC 04/17/2006   Allergic rhinitis 04/17/2006   Past Medical History:  Diagnosis Date   Anxiety    Arthritis    "right knee" (05/26/2014)   Asthma    CHF (congestive heart failure) (HCC)    chronic diastolic heart failure - sees Dr. Shirlee Latch   Chronic lower back pain    Dysrhythmia    irregular heart beat   GERD (gastroesophageal reflux disease)    HLD (hyperlipidemia)    Hypertension    Pneumonia    Stroke [facility])    TIA - ?2021   TIA (transient ischemic attack) 04/2018    Family History  Problem Relation Age of Onset   Hypertension Mother    Hypertension Father    Colon cancer Father    Hypertension Sister     Past Surgical History:  Procedure Laterality Date   AORTIC VALVE REPLACEMENT N/A 05/07/2022   Procedure: AORTIC VALVE REPLACEMENT (AVR) USING INSPIRIS VALVE SIZE ;  Surgeon: Eugenio Hoes, MD;  Location: [facility] OR;  Service: Open Heart Surgery;  Laterality: N/A;   BRONCHIAL BIOPSY  07/10/2021   Procedure: BRONCHIAL BIOPSIES;  Surgeon: Josephine Igo, DO;  Location: [facility] ENDOSCOPY;  Service: Pulmonary;;   BRONCHIAL BRUSHINGS  07/10/2021   Procedure: BRONCHIAL BRUSHINGS;  Surgeon: Josephine Igo, DO;  Location: [facility] ENDOSCOPY;  Service: Pulmonary;;   BRONCHIAL NEEDLE ASPIRATION BIOPSY  07/10/2021   Procedure: BRONCHIAL NEEDLE ASPIRATION BIOPSIES;  Surgeon: Josephine Igo, DO;  Location: [facility] ENDOSCOPY;  Service: Pulmonary;;   BRONCHIAL NEEDLE ASPIRATION BIOPSY  04/16/2022   Procedure: BRONCHIAL NEEDLE ASPIRATION BIOPSIES;  Surgeon: Josephine Igo, DO;  Location: [facility] ENDOSCOPY;  Service: Cardiopulmonary;;   BRONCHIAL WASHINGS  07/10/2021   Procedure: BRONCHIAL WASHINGS;  Surgeon: Josephine Igo, DO;  Location: [facility] ENDOSCOPY;  Service: Pulmonary;;   COLONOSCOPY WITH PROPOFOL N/A 03/03/2022    Procedure: COLONOSCOPY WITH PROPOFOL;  Surgeon: Jeani Hawking, MD;  Location: [facility] ENDOSCOPY;  Service: Gastroenterology;  Laterality: N/A;   ESOPHAGOGASTRODUODENOSCOPY N/A 05/27/2014   Procedure: ESOPHAGOGASTRODUODENOSCOPY (EGD);  Surgeon: Jeani Hawking, MD;  Location: [facility] ENDOSCOPY;  Service: Endoscopy;  Laterality: N/A;   ESOPHAGOGASTRODUODENOSCOPY (EGD) WITH PROPOFOL N/A 03/03/2022   Procedure: ESOPHAGOGASTRODUODENOSCOPY (EGD) WITH PROPOFOL;  Surgeon: Jeani Hawking, MD;  Location: [facility] ENDOSCOPY;  Service: Gastroenterology;  Laterality: N/A;   FRACTURE SURGERY     MITRAL VALVE REPLACEMENT N/A 05/07/2022   Procedure: MITRAL VALVE (MV)  REPLACEMENT USING MOSAIC 310 CINCH SIZE ;  Surgeon: Eugenio Hoes, MD;  Location: [facility] OR;  Service: Open Heart Surgery;  Laterality: N/A;   PATELLA FRACTURE SURGERY Right ~ 2009   RIGHT/LEFT HEART CATH AND CORONARY ANGIOGRAPHY N/A 02/28/2022   Procedure: RIGHT/LEFT HEART CATH AND CORONARY ANGIOGRAPHY;  Surgeon: Corky Crafts, MD;  Location[facility] INVASIVE CV LAB;  Service: Cardiovascular;  Laterality: N/A;   SAVORY DILATION N/A 03/03/2022   Procedure: SAVORY DILATION;  Surgeon: Jeani Hawking, MD;  Location: [facility] ENDOSCOPY;  Service: Gastroenterology;  Laterality: N/A;   TEE WITHOUT CARDIOVERSION N/A 03/01/2022   Procedure: TRANSESOPHAGEAL ECHOCARDIOGRAM (TEE);  Surgeon: Laurey Morale, MD;  Location: [facility] ENDOSCOPY;  Service: Cardiovascular;  Laterality: N/A;   TEE WITHOUT CARDIOVERSION N/A 05/07/2022   Procedure: TRANSESOPHAGEAL ECHOCARDIOGRAM;  Surgeon: Eugenio Hoes, MD;  Location: [facility] OR;  Service: Open Heart Surgery;  Laterality: N/A;   TONSILLECTOMY  ~ 1970   VAGINAL HYSTERECTOMY  1980's   VIDEO BRONCHOSCOPY WITH ENDOBRONCHIAL ULTRASOUND Bilateral 04/16/2022   Procedure: VIDEO BRONCHOSCOPY WITH ENDOBRONCHIAL ULTRASOUND;  Surgeon: Josephine Igo, DO;  Location: [facility] ENDOSCOPY;  Service: Cardiopulmonary;  Laterality: Bilateral;   VIDEO BRONCHOSCOPY WITH RADIAL  ENDOBRONCHIAL ULTRASOUND  07/10/2021   Procedure: RADIAL ENDOBRONCHIAL ULTRASOUND;  Surgeon: Josephine Igo, DO;  Location: [facility] ENDOSCOPY;  Service: Pulmonary;;   Social History   Occupational History   Not on file  Tobacco Use   Smoking status: Former    Current packs/day: 0.00    Average packs/day: 0.5 packs/day for 30.0 years (15.0 ttl pk-yrs)    Types: Cigarettes    Start date: 11/09/1968    Quit date: 11/10/1998    Years since quitting: 24.1   Smokeless tobacco: Never  Vaping Use   Vaping status: Never Used  Substance and Sexual Activity   Alcohol use: Yes    Comment: occasional   Drug use: No   Sexual activity: Not Currently

## 2023-01-09 ENCOUNTER — Other Ambulatory Visit (INDEPENDENT_AMBULATORY_CARE_PROVIDER_SITE_OTHER): Payer: Medicare HMO

## 2023-01-09 ENCOUNTER — Encounter: Payer: Self-pay | Admitting: Orthopedic Surgery

## 2023-01-09 ENCOUNTER — Ambulatory Visit (INDEPENDENT_AMBULATORY_CARE_PROVIDER_SITE_OTHER): Payer: Medicare HMO | Admitting: Orthopedic Surgery

## 2023-01-09 VITALS — BP 127/79 | HR 89 | Ht 66.0 in | Wt 142.0 lb

## 2023-01-09 DIAGNOSIS — M5441 Lumbago with sciatica, right side: Secondary | ICD-10-CM

## 2023-01-09 DIAGNOSIS — G8929 Other chronic pain: Secondary | ICD-10-CM

## 2023-01-09 MED ORDER — PREGABALIN 75 MG PO CAPS: 75.0000 mg | ORAL_CAPSULE | Freq: Two times a day (BID) | ORAL | 0 refills | Status: DC

## 2023-01-09 NOTE — Progress Notes (Addendum)
Orthopedic Spine Surgery Office Note  Assessment: Patient is a 72 y.o. female with low back pain that radiates into her right lower extremity along the posterior aspect, suspect radiculopathy   Plan: -Explained that initially conservative treatment is tried as a significant number of patients may experience relief with these treatment modalities. Discussed that the conservative treatments include:  -activity modification  -physical therapy  -over the counter pain medications  -medrol dosepak  -lumbar steroid injections -Patient has tried tylenol, ibuprofen, medrol dosepak, intramuscular steroid injection -Told her she can use 1000mg  of tylenol up three times per day -Lyrica denied by insurance. Sent in gabapentin instead -MRI has been previously ordered, will see her afterwards to discuss it and possible treatments for her -Patient should return to office in 3 weeks, x-rays at next visit: none   Patient expressed understanding of the plan and all questions were answered to the patient's satisfaction.   ___________________________________________________________________________   History:  Patient is a 72 y.o. female who presents today for lumbar spine.  Patient has a history of chronic low back pain that has gotten worse within the last month as she has developed radiating right leg pain.  She states that there is no trauma or injury that preceded the onset of the right leg pain.  She feels a goes down the posterior aspect of the right thigh and into the right leg. She is not having any pain radiating into the left leg.    Weakness: denies Symptoms of imbalance: denies Paresthesias and numbness: yes, gets paresthesias down the posterior aspect of the right leg in the same distribution as the pain. No other numbness or paresthesias  Bowel or bladder incontinence: denies Saddle anesthesia: denies  Treatments tried: tylenol, ibuprofen, medrol dosepak, intramuscular steroid  injection  Review of systems: Denies fevers and chills, night sweats, unexplained weight loss, history of cancer. Has had pain that wakes her at night  Past medical history: Anxiety HLD HTN GERD History of stroke CHF Chronic low back pain  Allergies: amoxicillin  Past surgical history:  Aortic valve replacement Mitral valve replacement Hysterectomy Tonsillectomy Right patella fracture ORIF  Social history: Denies use of nicotine product (smoking, vaping, patches, smokeless) Alcohol use: yes, approximately 3 drinks per week Denies recreational drug use   Physical Exam:  BMI of 22.9  General: no acute distress, appears stated age Neurologic: alert, answering questions appropriately, following commands Respiratory: unlabored breathing on room air, symmetric chest rise Psychiatric: appropriate affect, normal cadence to speech   MSK (spine):  -Strength exam      Left  Right EHL    5/5  3/5 TA    5/5  5/5 GSC    5/5  5/5 Knee extension  5/5  5/5 Hip flexion   5/5  5/5  -Sensory exam    Sensation intact to light touch in L3-S1 nerve distributions of bilateral lower extremities  -Achilles DTR: 2/4 on the left, 2/4 on the right -Patellar tendon DTR: 2/4 on the left, 2/4 on the right  -Straight leg raise: negative bilaterally -Femoral nerve stretch test: negative bilaterally -Clonus: no beats bilaterally  -Left hip exam: no pain through range of motion, negative stinchfield, negative faber -Right hip exam: no pain through range of motion, negative stinchfield, negative faber  Imaging: XRs of the lumbar spine from 12/24/2022 and 01/09/2023 were independently reviewed and interpreted, showing 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 Disc height loss at all lumbar levels. No fracture or dislocation seen. No  evidence of instability on flexion/extension views.    Patient name: Shannon [name] Patient MRN: [id number] Date of visit: 01/09/23

## 2023-01-10 MED ORDER — GABAPENTIN 300 MG PO CAPS: 300.0000 mg | ORAL_CAPSULE | Freq: Three times a day (TID) | ORAL | 0 refills | Status: DC

## 2023-01-10 NOTE — Addendum Note (Signed)
Addended by: Willia Craze on: 01/10/2023 11:39 AM   Modules accepted: Orders

## 2023-01-23 ENCOUNTER — Ambulatory Visit: Payer: Medicare HMO

## 2023-02-08 ENCOUNTER — Ambulatory Visit
Admission: RE | Admit: 2023-02-08 | Discharge: 2023-02-08 | Disposition: A | Discharge status: Home / Self Care | Payer: Medicare HMO | Source: Ambulatory Visit | Attending: Physician Assistant

## 2023-02-08 DIAGNOSIS — G8929 Other chronic pain: Secondary | ICD-10-CM

## 2023-02-20 ENCOUNTER — Ambulatory Visit: Payer: Medicare HMO | Admitting: Orthopedic Surgery

## 2023-02-20 DIAGNOSIS — M5416 Radiculopathy, lumbar region: Secondary | ICD-10-CM | POA: Diagnosis not present

## 2023-02-20 NOTE — Progress Notes (Signed)
 Orthopedic Spine Surgery Office Note   Assessment: Patient is a 73 y.o. female with low back pain that radiates into her right lower extremity along the posterior aspect, suspect L5 radiculopathy     Plan: -Patient has tried tylenol , ibuprofen, medrol  dosepak, intramuscular steroid injection, gabapentin  -Recommended diagnostic/therapeutic lumbar injection -I explained that given the distribution of her pain, I feel that her symptoms are coming from the L5 nerve. There is lateral recess stenosis at L4/5 and foraminal stenosis at L5/S1 that could both affect the L5 nerve -Patient should return to office in 4-5 weeks, x-rays at next visit: none     Patient expressed understanding of the plan and all questions were answered to the patient's satisfaction.    ___________________________________________________________________________     History:   Patient is a 73 y.o. female who presents today for follow up on her lumbar spine. Her pain is not as bad as it was when she was last seen in the office. However, she is still having low back pain that radiates into her right lower extremity. She feels it radiating into her posterior thigh and into her posterior leg. No pain radiating into the left leg. She has not developed any new symptoms since she was last seen.    Treatments tried: tylenol , ibuprofen, medrol  dosepak, intramuscular steroid injection, gabapentin      Physical Exam:   General: no acute distress, appears stated age Neurologic: alert, answering questions appropriately, following commands Respiratory: unlabored breathing on room air, symmetric chest rise Psychiatric: appropriate affect, normal cadence to speech     MSK (spine):   -Strength exam                                                   Left                  Right EHL                              5/5                  3/5 TA                                 5/5                  5/5 GSC                             5/5                   5/5 Knee extension            5/5                  5/5 Hip flexion                    5/5                  5/5   -Sensory exam                           Sensation intact to light  touch in L3-S1 nerve distributions of bilateral lower extremities   -Achilles DTR: 2/4 on the left, 2/4 on the right -Patellar tendon DTR: 2/4 on the left, 2/4 on the right   -Straight leg raise: negative bilaterally -Femoral nerve stretch test: negative bilaterally -Clonus: no beats bilaterally   Imaging: XRs of the lumbar spine from 12/24/2022 and 01/09/2023 were previously independently reviewed and interpreted, showing 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 Disc height loss at all lumbar levels. No fracture or dislocation seen. No evidence of instability on flexion/extension views.    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 Central and lateral recess stenosis at L4/5. Foraminal stenosis is seen at bilaterally at L5/S1 (R>L).     Patient name: Shannon [name] Patient MRN: [id number] Date of visit: 02/20/23

## 2023-02-21 ENCOUNTER — Ambulatory Visit: Payer: Medicare HMO | Admitting: Sports Medicine

## 2023-03-03 ENCOUNTER — Other Ambulatory Visit: Payer: Self-pay | Admitting: Pulmonary Disease

## 2023-03-03 DIAGNOSIS — J4540 Moderate persistent asthma, uncomplicated: Secondary | ICD-10-CM

## 2023-03-12 ENCOUNTER — Other Ambulatory Visit: Payer: Self-pay | Admitting: Pulmonary Disease

## 2023-03-12 DIAGNOSIS — K219 Gastro-esophageal reflux disease without esophagitis: Secondary | ICD-10-CM

## 2023-03-13 ENCOUNTER — Other Ambulatory Visit: Payer: Self-pay

## 2023-03-13 ENCOUNTER — Ambulatory Visit: Payer: Medicare HMO | Admitting: Physical Medicine and Rehabilitation

## 2023-03-13 DIAGNOSIS — M5416 Radiculopathy, lumbar region: Secondary | ICD-10-CM

## 2023-03-13 MED ORDER — METHYLPREDNISOLONE ACETATE 40 MG/ML IJ SUSP
40.0000 mg | Freq: Once | INTRAMUSCULAR | Status: AC
Start: 2023-03-13 — End: 2023-03-13
  Administered 2023-03-13: 40 mg

## 2023-03-13 NOTE — Progress Notes (Signed)
Functional Pain Scale - descriptive words and definitions  Uncomfortable (3)  Pain is present but can complete all ADL's/sleep is slightly affected and passive distraction only gives marginal relief. Mild range order  Average Pain 5  R > L  +Driver, -BT, -Dye Allergies.

## 2023-03-13 NOTE — Patient Instructions (Signed)

## 2023-03-15 ENCOUNTER — Other Ambulatory Visit: Payer: Self-pay | Admitting: General Practice

## 2023-03-15 ENCOUNTER — Other Ambulatory Visit (HOSPITAL_COMMUNITY): Payer: Self-pay | Admitting: Cardiology

## 2023-03-19 ENCOUNTER — Other Ambulatory Visit: Payer: Self-pay | Admitting: Pulmonary Disease

## 2023-03-19 DIAGNOSIS — J4540 Moderate persistent asthma, uncomplicated: Secondary | ICD-10-CM

## 2023-03-20 NOTE — Procedures (Signed)
Lumbosacral Transforaminal Epidural Steroid Injection - Sub-Pedicular Approach with Fluoroscopic Guidance  Patient: Shannon [name]      Date of Birth: 1950/04/26 MRN: [id number] PCP: Donato Schultz, FNP      Visit Date: 03/13/2023   Universal Protocol:    Date/Time: 03/13/2023  Consent Given By: the patient  Position: PRONE  Additional Comments: Vital signs were monitored before and after the procedure. Patient was prepped and draped in the usual sterile fashion. The correct patient, procedure, and site was verified.   Injection Procedure Details:   Procedure diagnoses: Radiculopathy, lumbar region [M54.16]    Meds Administered:  Meds ordered this encounter  Medications   methylPREDNISolone acetate (DEPO-MEDROL) injection 40 mg    Laterality: Right  Location/Site: L5  Needle:5.0 in., 22 ga.  Short bevel or Quincke spinal needle  Needle Placement: Transforaminal  Findings:    -Comments: Excellent flow of contrast along the nerve, nerve root and into the epidural space.  Procedure Details: After squaring off the end-plates to get a true AP view, the C-arm was positioned so that an oblique view of the foramen as noted above was visualized. The target area is just inferior to the "nose of the scotty dog" or sub pedicular. The soft tissues overlying this structure were infiltrated with 2-3 ml. of 1% Lidocaine without Epinephrine.  The spinal needle was inserted toward the target using a "trajectory" view along the fluoroscope beam.  Under AP and lateral visualization, the needle was advanced so it did not puncture dura and was located close the 6 O'Clock position of the pedical in AP tracterory. Biplanar projections were used to confirm position. Aspiration was confirmed to be negative for CSF and/or blood. A 1-2 ml. volume of Isovue-250 was injected and flow of contrast was noted at each level. Radiographs were obtained for documentation purposes.   After attaining the  desired flow of contrast documented above, a 0.5 to 1.0 ml test dose of 0.25% Marcaine was injected into each respective transforaminal space.  The patient was observed for 90 seconds post injection.  After no sensory deficits were reported, and normal lower extremity motor function was noted,   the above injectate was administered so that equal amounts of the injectate were placed at each foramen (level) into the transforaminal epidural space.   Additional Comments:  The patient tolerated the procedure well Dressing: 2 x 2 sterile gauze and Band-Aid    Post-procedure details: Patient was observed during the procedure. Post-procedure instructions were reviewed.  Patient left the clinic in stable condition.

## 2023-03-20 NOTE — Progress Notes (Signed)
Shannon [name] - 73 y.o. female MRN [id number]  Date of birth: Dec 04, 1950  Office Visit Note: Visit Date: 03/13/2023 PCP: Donato Schultz, FNP Referred by: London Sheer, MD  Subjective: Chief Complaint  Patient presents with   Lower Back - Pain   HPI:  Shannon [name] is a 73 y.o. female who comes in today at the request of Dr. Willia Craze for planned Right L5-S1 Lumbar Transforaminal epidural steroid injection with fluoroscopic guidance.  The patient has failed conservative care including home exercise, medications, time and activity modification.  This injection will be diagnostic and hopefully therapeutic.  Please see requesting physician notes for further details and justification.   ROS Otherwise per HPI.  Assessment & Plan: Visit Diagnoses:    ICD-10-CM   1. Radiculopathy, lumbar region  M54.16 XR C-ARM NO REPORT    Epidural Steroid injection    methylPREDNISolone acetate (DEPO-MEDROL) injection 40 mg      Plan: No additional findings.   Meds & Orders:  Meds ordered this encounter  Medications   methylPREDNISolone acetate (DEPO-MEDROL) injection 40 mg    Orders Placed This Encounter  Procedures   XR C-ARM NO REPORT   Epidural Steroid injection    Follow-up: Return for visit to requesting provider as needed.   Procedures: No procedures performed  Lumbosacral Transforaminal Epidural Steroid Injection - Sub-Pedicular Approach with Fluoroscopic Guidance  Patient: Shannon [name]      Date of Birth: 12-05-1950 MRN: [id number] PCP: Donato Schultz, FNP      Visit Date: 03/13/2023   Universal Protocol:    Date/Time: 03/13/2023  Consent Given By: the patient  Position: PRONE  Additional Comments: Vital signs were monitored before and after the procedure. Patient was prepped and draped in the usual sterile fashion. The correct patient, procedure, and site was verified.   Injection Procedure Details:   Procedure diagnoses: Radiculopathy,  lumbar region [M54.16]    Meds Administered:  Meds ordered this encounter  Medications   methylPREDNISolone acetate (DEPO-MEDROL) injection 40 mg    Laterality: Right  Location/Site: L5  Needle:5.0 in., 22 ga.  Short bevel or Quincke spinal needle  Needle Placement: Transforaminal  Findings:    -Comments: Excellent flow of contrast along the nerve, nerve root and into the epidural space.  Procedure Details: After squaring off the end-plates to get a true AP view, the C-arm was positioned so that an oblique view of the foramen as noted above was visualized. The target area is just inferior to the "nose of the scotty dog" or sub pedicular. The soft tissues overlying this structure were infiltrated with 2-3 ml. of 1% Lidocaine without Epinephrine.  The spinal needle was inserted toward the target using a "trajectory" view along the fluoroscope beam.  Under AP and lateral visualization, the needle was advanced so it did not puncture dura and was located close the 6 O'Clock position of the pedical in AP tracterory. Biplanar projections were used to confirm position. Aspiration was confirmed to be negative for CSF and/or blood. A 1-2 ml. volume of Isovue-250 was injected and flow of contrast was noted at each level. Radiographs were obtained for documentation purposes.   After attaining the desired flow of contrast documented above, a 0.5 to 1.0 ml test dose of 0.25% Marcaine was injected into each respective transforaminal space.  The patient was observed for 90 seconds post injection.  After no sensory deficits were reported, and normal lower extremity motor function was noted,   the  above injectate was administered so that equal amounts of the injectate were placed at each foramen (level) into the transforaminal epidural space.   Additional Comments:  The patient tolerated the procedure well Dressing: 2 x 2 sterile gauze and Band-Aid    Post-procedure details: Patient was observed  during the procedure. Post-procedure instructions were reviewed.  Patient left the clinic in stable condition.    Clinical History: MRI LUMBAR SPINE WITHOUT CONTRAST   TECHNIQUE: Multiplanar, multisequence MR imaging of the lumbar spine was performed. No intravenous contrast was administered.   COMPARISON:  Radiographs 01/09/2023 and 12/24/2022.  MRI 01/20/2008.   FINDINGS: Technical note: Despite efforts by the technologist and patient, moderate motion artifact is present on today's exam and could not be eliminated. This reduces exam sensitivity and specificity.   Segmentation: Conventional anatomy assumed, with the last open disc space designated L5-S1.Concordant with prior imaging.   Alignment: Convex right scoliosis. Near anatomic lateral alignment with minimal degenerative anterolisthesis at L4-5.   Vertebrae: No worrisome osseous lesion, acute fracture or pars defect. Multilevel endplate degenerative changes have mildly progressed compared with the remote MRI. The lumbar pedicles are somewhat short on a congenital basis. Mild sacroiliac degenerative changes bilaterally.   Conus medullaris: Extends to the L1 level. The conus and cauda equina appear normal.   Paraspinal and other soft tissues: No significant paraspinal findings.   Disc levels:   Sagittal images demonstrate disc bulging at T12-L1 without resulting spinal stenosis or significant foraminal narrowing.   L1-2: Mildly progressive loss of disc height with annular disc bulging and endplate osteophytes asymmetric to the left. Mild facet and ligamentous hypertrophy. No significant central spinal stenosis. Mild left lateral recess and left foraminal narrowing.   L2-3: Chronic loss of disc height with annular disc bulging and endplate osteophytes. Mild facet and ligamentous hypertrophy. No significant spinal stenosis or nerve root encroachment.   L3-4: Chronic loss of disc height with annular disc bulging  and endplate osteophytes asymmetric to the left. Mild facet and ligamentous hypertrophy. Resulting mild spinal stenosis with mild lateral recess narrowing bilaterally. Mild right and moderate left foraminal narrowing, similar to previous study.   L4-5: Mildly progressive loss of disc height with annular disc bulging and endplate osteophytes asymmetric to the right. Moderate facet and ligamentous hypertrophy. Mildly progressive multifactorial spinal stenosis, still moderate. Moderate foraminal narrowing now appears more symmetric.   L5-S1: Progressive loss of disc height with annular disc bulging and endplate osteophytes asymmetric to the right. Mild facet and ligamentous hypertrophy. Interval increased narrowing of the right lateral recess and right foramen with possible encroachment on the right L5 and S1 nerve roots. The left foramen appears mildly narrowed.   IMPRESSION: 1. 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 2. 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 Moderate foraminal narrowing bilaterally at this level now appears more symmetric. 3. Interval increased narrowing of the right lateral recess and right foramen at L5-S1 with possible encroachment on the right L5 and S1 nerve roots. 4. Stable mild spinal stenosis at L3-4 with mild right and moderate left foraminal narrowing. 5. No acute findings demonstrated.     Electronically Signed   By: Carey Bullocks M.D.   On: 02/25/2023 12:12     Objective:  VS:  HT:    WT:   BMI:     BP:   HR: bpm  TEMP: ( )  RESP:  Physical Exam  Vitals and nursing note reviewed.  Constitutional:      General: She is not in acute distress.    Appearance: Normal appearance. She is not ill-appearing.  HENT:     Head: Normocephalic and atraumatic.     Right Ear: External ear normal.     Left Ear:  External ear normal.  Eyes:     Extraocular Movements: Extraocular movements intact.  Cardiovascular:     Rate and Rhythm: Normal rate.     Pulses: Normal pulses.  Pulmonary:     Effort: Pulmonary effort is normal. No respiratory distress.  Abdominal:     General: There is no distension.     Palpations: Abdomen is soft.  Musculoskeletal:        General: Tenderness present.     Cervical back: Neck supple.     Right lower leg: No edema.     Left lower leg: No edema.     Comments: Patient has good distal strength with no pain over the greater trochanters.  No clonus or focal weakness.  Skin:    Findings: No erythema, lesion or rash.  Neurological:     General: No focal deficit present.     Mental Status: She is alert and oriented to person, place, and time.     Sensory: No sensory deficit.     Motor: No weakness or abnormal muscle tone.     Coordination: Coordination normal.  Psychiatric:        Mood and Affect: Mood normal.        Behavior: Behavior normal.      Imaging: No results found.

## 2023-03-27 ENCOUNTER — Other Ambulatory Visit: Payer: Self-pay | Admitting: Family

## 2023-03-27 ENCOUNTER — Ambulatory Visit (INDEPENDENT_AMBULATORY_CARE_PROVIDER_SITE_OTHER): Payer: 59 | Admitting: Orthopedic Surgery

## 2023-03-27 DIAGNOSIS — Z1231 Encounter for screening mammogram for malignant neoplasm of breast: Secondary | ICD-10-CM

## 2023-03-27 DIAGNOSIS — M5416 Radiculopathy, lumbar region: Secondary | ICD-10-CM | POA: Diagnosis not present

## 2023-03-27 NOTE — Progress Notes (Signed)
 Orthopedic Spine Surgery Office Note   Assessment: Patient is a 73 y.o. female with low back pain that radiates into her right lower extremity along the posterior aspect, suspect L5 radiculopathy     Plan: -Patient has tried tylenol , ibuprofen, medrol  dosepak, intramuscular steroid injection, gabapentin , lumbar steroid injection -Patient got 100% percent relief with her injection, so she wanted to try another one. Referral provided to her today -I told her if she does not get good relief with this injection, then her options would be surgical decompression or pain management. Would plan for L4 hemilaminotomy and L5 foraminotomy -Patient should return to office in 5-6 weeks, x-rays at next visit: none     Patient expressed understanding of the plan and all questions were answered to the patient's satisfaction.    ___________________________________________________________________________     History:   Patient is a 73 y.o. female who presents today for follow up on her lumbar spine. Her pain is not as bad as it was when she was last seen in the office. However, she is still having low back pain that radiates into her right lower extremity. She feels it radiating into her posterior thigh and into her posterior leg. No pain radiating into the left leg. She has not developed any new symptoms since she was last seen.    Treatments tried: tylenol , ibuprofen, medrol  dosepak, intramuscular steroid injection, gabapentin , lumbar steroid injections     Physical Exam:   General: no acute distress, appears stated age Neurologic: alert, answering questions appropriately, following commands Respiratory: unlabored breathing on room air, symmetric chest rise Psychiatric: appropriate affect, normal cadence to speech     MSK (spine):   -Strength exam                                                   Left                  Right EHL                              5/5                  3/5 TA                                  5/5                  5/5 GSC                             5/5                  5/5 Knee extension            5/5                  5/5 Hip flexion                    5/5                  5/5   -Sensory exam  Sensation intact to light touch in L3-S1 nerve distributions of bilateral lower extremities   -Achilles DTR: 2/4 on the left, 2/4 on the right -Patellar tendon DTR: 2/4 on the left, 2/4 on the right   -Straight leg raise: negative bilaterally -Femoral nerve stretch test: negative bilaterally -Clonus: no beats bilaterally   Imaging: XRs of the lumbar spine from 12/24/2022 and 01/09/2023 were previously independently reviewed and interpreted, showing 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 Disc height loss at all lumbar levels. No fracture or dislocation seen. No evidence of instability on flexion/extension views.    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 Central and lateral recess stenosis at L4/5. Foraminal stenosis is seen at bilaterally at L5/S1 (R>L).      Patient name: Shannon [name] Patient MRN: [id number] Date of visit: 03/27/23

## 2023-04-02 ENCOUNTER — Telehealth: Payer: Self-pay | Admitting: Pulmonary Disease

## 2023-04-02 NOTE — Telephone Encounter (Signed)
Patient notified that she will need an appointment to get refills. Over a year since last visit. She has an appointment with Dr young and will have to get scheduled with her primary provider after.

## 2023-04-02 NOTE — Telephone Encounter (Signed)
Patient states needs refill for [place]. Pharmacy is [facility]. Patient phone number is [phone].

## 2023-04-07 NOTE — Progress Notes (Deleted)
 Shannon [name], female    DOB: 02/11/1951   MRN: [id number]   Brief patient profile:  ***  *** referred to pulmonary clinic 04/09/2023 by *** for ***        History of Present Illness  04/09/2023  Pulmonary/ 1st office eval/Hurbert Duran  No chief complaint on file.    Dyspnea:  *** Cough: *** Sleep: *** SABA use: *** 02 use:*** LDSCT:***  No obvious day to day or daytime pattern/variability or assoc excess/ purulent sputum or mucus plugs or hemoptysis or cp or chest tightness, subjective wheeze or overt sinus or hb symptoms.    Also denies any obvious fluctuation of symptoms with weather or environmental changes or other aggravating or alleviating factors except as outlined above   No unusual exposure hx or h/o childhood pna/ asthma or knowledge of premature birth.  Current Allergies, Complete Past Medical History, Past Surgical History, Family History, and Social History were reviewed in [employer] record.  ROS  The following are not active complaints unless bolded Hoarseness, sore throat, dysphagia, dental problems, itching, sneezing,  nasal congestion or discharge of excess mucus or purulent secretions, ear ache,   fever, chills, sweats, unintended wt loss or wt gain, classically pleuritic or exertional cp,  orthopnea pnd or arm/hand swelling  or leg swelling, presyncope, palpitations, abdominal pain, anorexia, nausea, vomiting, diarrhea  or change in bowel habits or change in bladder habits, change in stools or change in urine, dysuria, hematuria,  rash, arthralgias, visual complaints, headache, numbness, weakness or ataxia or problems with walking or coordination,  change in mood or  memory.             Outpatient Medications Prior to Visit  Medication Sig Dispense Refill   albuterol (PROVENTIL) (2.5 MG/3ML) 0.083% nebulizer solution Take 3 mLs (2.5 mg total) by nebulization every 6 (six) hours as needed for wheezing or shortness of breath. 75 mL 12    aspirin EC 81 MG tablet Take 1 tablet (81 mg total) by mouth daily. Swallow whole. 30 tablet 12   atorvastatin (LIPITOR) 80 MG tablet Take 1 tablet (80 mg total) by mouth daily at 6 PM. 30 tablet 2   busPIRone (BUSPAR) 10 MG tablet Take 10 mg by mouth 2 (two) times daily.     Cholecalciferol (VITAMIN D) 50 MCG (2000 UT) tablet Take 2,000 Units by mouth daily.     clopidogrel (PLAVIX) 75 MG tablet Take 1 tablet (75 mg total) by mouth daily. 90 tablet 3   desloratadine (CLARINEX) 5 MG tablet Take 5 mg by mouth daily.     ezetimibe (ZETIA) 10 MG tablet Take 10 mg by mouth daily.     FLUoxetine (PROZAC) 20 MG capsule Take 20 mg by mouth daily.     fluticasone (FLONASE) 50 MCG/ACT nasal spray Place 1 spray into both nostrils daily.     fluticasone-salmeterol (WIXELA INHUB) 500-50 MCG/ACT AEPB Inhale 1 puff into the lungs in the morning and at bedtime. 60 each 6   furosemide (LASIX) 20 MG tablet Take 1 tablet (20 mg total) by mouth as needed. For weight gain of 3 lbs in 24 hours or 5 lbs in a week 90 tablet 3   gabapentin (NEURONTIN) 300 MG capsule Take 1 capsule (300 mg total) by mouth 3 (three) times daily. 90 capsule 0   losartan (COZAAR) 25 MG tablet Take 1 tablet (25 mg total) by mouth at bedtime. 90 tablet 3   methylPREDNISolone (MEDROL DOSEPAK) 4 MG TBPK  tablet Take as directed with food 21 tablet 0   montelukast (SINGULAIR) 10 MG tablet Take 1 tablet (10 mg total) by mouth at bedtime. 30 tablet 11   pantoprazole (PROTONIX) 40 MG tablet TAKE 1 TABLET BY MOUTH EVERY DAY 90 tablet 1   potassium chloride SA (KLOR-CON M20) 20 MEQ tablet Take 1 tablet (20 mEq total) by mouth as directed. Take when taking lasix dose     spironolactone (ALDACTONE) 25 MG tablet Take 1 tablet (25 mg total) by mouth daily. 90 tablet 3   No facility-administered medications prior to visit.    Past Medical History:  Diagnosis Date   Anxiety    Arthritis    "right knee" (05/26/2014)   Asthma    CHF (congestive heart  failure) (HCC)    chronic diastolic heart failure - sees Dr. Shirlee Latch   Chronic lower back pain    Dysrhythmia    irregular heart beat   GERD (gastroesophageal reflux disease)    HLD (hyperlipidemia)    Hypertension    Pneumonia    Stroke [facility])    TIA - ?2021   TIA (transient ischemic attack) 04/2018      Objective:     There were no vitals taken for this visit.         Assessment   No problem-specific Assessment & Plan notes found for this encounter.     Sandrea Hughs, MD 04/07/2023

## 2023-04-09 ENCOUNTER — Ambulatory Visit: Payer: 59 | Admitting: Internal Medicine

## 2023-04-10 ENCOUNTER — Encounter: Payer: 59 | Admitting: Physical Medicine and Rehabilitation

## 2023-04-18 ENCOUNTER — Ambulatory Visit
Admission: RE | Admit: 2023-04-18 | Discharge: 2023-04-18 | Disposition: A | Discharge status: Home / Self Care | Payer: Medicare HMO | Source: Ambulatory Visit | Attending: Family | Admitting: Family

## 2023-04-18 DIAGNOSIS — Z1231 Encounter for screening mammogram for malignant neoplasm of breast: Secondary | ICD-10-CM

## 2023-04-23 ENCOUNTER — Ambulatory Visit: Payer: 59 | Admitting: Physical Medicine and Rehabilitation

## 2023-04-23 ENCOUNTER — Other Ambulatory Visit: Payer: Self-pay

## 2023-04-23 VITALS — BP 123/77 | HR 98

## 2023-04-23 DIAGNOSIS — M5416 Radiculopathy, lumbar region: Secondary | ICD-10-CM

## 2023-04-23 MED ORDER — METHYLPREDNISOLONE ACETATE 40 MG/ML IJ SUSP
40.0000 mg | Freq: Once | INTRAMUSCULAR | Status: AC
  Administered 2023-04-23: 40 mg

## 2023-04-23 NOTE — Progress Notes (Signed)
 Pain Scale   Average Pain 4        +Driver, -BT- Eliquis, - No Dye Allergies.

## 2023-04-23 NOTE — Patient Instructions (Signed)

## 2023-04-25 NOTE — Procedures (Signed)
 S1 Lumbosacral Transforaminal Epidural Steroid Injection - Sub-Pedicular Approach with Fluoroscopic Guidance   Patient: Shannon [name]      Date of Birth: October 19, 1950 MRN: [id number] PCP: Donato Schultz, FNP      Visit Date: 04/23/2023   Universal Protocol:    Date/Time: 03/07/252:52 PM  Consent Given By: the patient  Position:  PRONE  Additional Comments: Vital signs were monitored before and after the procedure. Patient was prepped and draped in the usual sterile fashion. The correct patient, procedure, and site was verified.   Injection Procedure Details:  Procedure Site One Meds Administered:  Meds ordered this encounter  Medications   methylPREDNISolone acetate (DEPO-MEDROL) injection 40 mg    Laterality: Right  Location/Site:  S1 Foramen   Needle size: 22 ga.  Needle type: Spinal  Needle Placement: Transforaminal  Findings:   -Comments: Excellent flow of contrast along the nerve, nerve root and into the epidural space.  Epidurogram: Contrast epidurogram showed no nerve root cut off or restricted flow pattern.  Procedure Details: After squaring off the sacral end-plate to get a true AP view, the C-arm was positioned so that the best possible view of the S1 foramen was visualized. The soft tissues overlying this structure were infiltrated with 2-3 ml. of 1% Lidocaine without Epinephrine.    The spinal needle was inserted toward the target using a "trajectory" view along the fluoroscope beam.  Under AP and lateral visualization, the needle was advanced so it did not puncture dura. Biplanar projections were used to confirm position. Aspiration was confirmed to be negative for CSF and/or blood. A 1-2 ml. volume of Isovue-250 was injected and flow of contrast was noted at each level. Radiographs were obtained for documentation purposes.   After attaining the desired flow of contrast documented above, a 0.5 to 1.0 ml test dose of 0.25% Marcaine was injected into  each respective transforaminal space.  The patient was observed for 90 seconds post injection.  After no sensory deficits were reported, and normal lower extremity motor function was noted,   the above injectate was administered so that equal amounts of the injectate were placed at each foramen (level) into the transforaminal epidural space.   Additional Comments:  The patient tolerated the procedure well Dressing: Band-Aid with 2 x 2 sterile gauze    Post-procedure details: Patient was observed during the procedure. Post-procedure instructions were reviewed.  Patient left the clinic in stable condition.

## 2023-04-25 NOTE — Progress Notes (Signed)
 Shannon [name] - 73 y.o. female MRN [id number]  Date of birth: 13-Oct-1950  Office Visit Note: Visit Date: 04/23/2023 PCP: Donato Schultz, FNP Referred by: London Sheer, MD  Subjective: Chief Complaint  Patient presents with   Lower Back - Pain   HPI:  Shannon [name] is a 73 y.o. female who comes in today at the request of Dr. Willia Craze for planned Right L5-S1 Lumbar transforaminal epidural steroid injection with fluoroscopic guidance.  The patient has failed conservative care including home exercise, medications, time and activity modification.  This injection will be diagnostic and hopefully therapeutic.  Please see requesting physician notes for further details and justification.  Likely of brief review patient has lateral recess and foraminal narrowing at L5-S1 on the right that could affect either the L5 or S1 nerve root.  Prior L5 injection gave her really great relief but only lasted a couple weeks.  Diagnostically showing the L5 foramen is at least a big component of her pain.  However on clinical exam today she is having a lot of pain posteriorly down the leg to the calf in a classic S1 distribution.  We elected today to complete an S1 transforaminal injection.   ROS Otherwise per HPI.  Assessment & Plan: Visit Diagnoses:    ICD-10-CM   1. Radiculopathy, lumbar region  M54.16 XR C-ARM NO REPORT    Epidural Steroid injection    methylPREDNISolone acetate (DEPO-MEDROL) injection 40 mg      Plan: No additional findings.   Meds & Orders:  Meds ordered this encounter  Medications   methylPREDNISolone acetate (DEPO-MEDROL) injection 40 mg    Orders Placed This Encounter  Procedures   XR C-ARM NO REPORT   Epidural Steroid injection    Follow-up: Return for visit to requesting provider as needed.   Procedures: No procedures performed  S1 Lumbosacral Transforaminal Epidural Steroid Injection - Sub-Pedicular Approach with Fluoroscopic Guidance   Patient:  Shannon [name]      Date of Birth: 1950-02-23 MRN: [id number] PCP: Donato Schultz, FNP      Visit Date: 04/23/2023   Universal Protocol:    Date/Time: 03/07/252:52 PM  Consent Given By: the patient  Position:  PRONE  Additional Comments: Vital signs were monitored before and after the procedure. Patient was prepped and draped in the usual sterile fashion. The correct patient, procedure, and site was verified.   Injection Procedure Details:  Procedure Site One Meds Administered:  Meds ordered this encounter  Medications   methylPREDNISolone acetate (DEPO-MEDROL) injection 40 mg    Laterality: Right  Location/Site:  S1 Foramen   Needle size: 22 ga.  Needle type: Spinal  Needle Placement: Transforaminal  Findings:   -Comments: Excellent flow of contrast along the nerve, nerve root and into the epidural space.  Epidurogram: Contrast epidurogram showed no nerve root cut off or restricted flow pattern.  Procedure Details: After squaring off the sacral end-plate to get a true AP view, the C-arm was positioned so that the best possible view of the S1 foramen was visualized. The soft tissues overlying this structure were infiltrated with 2-3 ml. of 1% Lidocaine without Epinephrine.    The spinal needle was inserted toward the target using a "trajectory" view along the fluoroscope beam.  Under AP and lateral visualization, the needle was advanced so it did not puncture dura. Biplanar projections were used to confirm position. Aspiration was confirmed to be negative for CSF and/or blood. A 1-2 ml. volume of  Isovue-250 was injected and flow of contrast was noted at each level. Radiographs were obtained for documentation purposes.   After attaining the desired flow of contrast documented above, a 0.5 to 1.0 ml test dose of 0.25% Marcaine was injected into each respective transforaminal space.  The patient was observed for 90 seconds post injection.  After no sensory deficits  were reported, and normal lower extremity motor function was noted,   the above injectate was administered so that equal amounts of the injectate were placed at each foramen (level) into the transforaminal epidural space.   Additional Comments:  The patient tolerated the procedure well Dressing: Band-Aid with 2 x 2 sterile gauze    Post-procedure details: Patient was observed during the procedure. Post-procedure instructions were reviewed.  Patient left the clinic in stable condition.   Clinical History: MRI LUMBAR SPINE WITHOUT CONTRAST   TECHNIQUE: Multiplanar, multisequence MR imaging of the lumbar spine was performed. No intravenous contrast was administered.   COMPARISON:  Radiographs 01/09/2023 and 12/24/2022.  MRI 01/20/2008.   FINDINGS: Technical note: Despite efforts by the technologist and patient, moderate motion artifact is present on today's exam and could not be eliminated. This reduces exam sensitivity and specificity.   Segmentation: Conventional anatomy assumed, with the last open disc space designated L5-S1.Concordant with prior imaging.   Alignment: Convex right scoliosis. Near anatomic lateral alignment with minimal degenerative anterolisthesis at L4-5.   Vertebrae: No worrisome osseous lesion, acute fracture or pars defect. Multilevel endplate degenerative changes have mildly progressed compared with the remote MRI. The lumbar pedicles are somewhat short on a congenital basis. Mild sacroiliac degenerative changes bilaterally.   Conus medullaris: Extends to the L1 level. The conus and cauda equina appear normal.   Paraspinal and other soft tissues: No significant paraspinal findings.   Disc levels:   Sagittal images demonstrate disc bulging at T12-L1 without resulting spinal stenosis or significant foraminal narrowing.   L1-2: Mildly progressive loss of disc height with annular disc bulging and endplate osteophytes asymmetric to the left. Mild  facet and ligamentous hypertrophy. No significant central spinal stenosis. Mild left lateral recess and left foraminal narrowing.   L2-3: Chronic loss of disc height with annular disc bulging and endplate osteophytes. Mild facet and ligamentous hypertrophy. No significant spinal stenosis or nerve root encroachment.   L3-4: Chronic loss of disc height with annular disc bulging and endplate osteophytes asymmetric to the left. Mild facet and ligamentous hypertrophy. Resulting mild spinal stenosis with mild lateral recess narrowing bilaterally. Mild right and moderate left foraminal narrowing, similar to previous study.   L4-5: Mildly progressive loss of disc height with annular disc bulging and endplate osteophytes asymmetric to the right. Moderate facet and ligamentous hypertrophy. Mildly progressive multifactorial spinal stenosis, still moderate. Moderate foraminal narrowing now appears more symmetric.   L5-S1: Progressive loss of disc height with annular disc bulging and endplate osteophytes asymmetric to the right. Mild facet and ligamentous hypertrophy. Interval increased narrowing of the right lateral recess and right foramen with possible encroachment on the right L5 and S1 nerve roots. The left foramen appears mildly narrowed.   IMPRESSION: 1. 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 2. 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 Moderate foraminal narrowing bilaterally at this level now appears more symmetric. 3. Interval increased narrowing of the right lateral recess and right foramen at L5-S1 with possible encroachment on the right L5 and S1 nerve  roots. 4. Stable mild spinal stenosis at L3-4 with mild right and moderate left foraminal narrowing. 5. No acute findings demonstrated.     Electronically Signed   By: Carey Bullocks  M.D.   On: 02/25/2023 12:12     Objective:  VS:  HT:    WT:   BMI:     BP:123/77  HR:98bpm  TEMP: ( )  RESP:  Physical Exam Vitals and nursing note reviewed.  Constitutional:      General: She is not in acute distress.    Appearance: Normal appearance. She is not ill-appearing.  HENT:     Head: Normocephalic and atraumatic.     Right Ear: External ear normal.     Left Ear: External ear normal.  Eyes:     Extraocular Movements: Extraocular movements intact.  Cardiovascular:     Rate and Rhythm: Normal rate.     Pulses: Normal pulses.  Pulmonary:     Effort: Pulmonary effort is normal. No respiratory distress.  Abdominal:     General: There is no distension.     Palpations: Abdomen is soft.  Musculoskeletal:        General: Tenderness present.     Cervical back: Neck supple.     Right lower leg: No edema.     Left lower leg: No edema.     Comments: Patient has good distal strength with no pain over the greater trochanters.  No clonus or focal weakness.  Skin:    Findings: No erythema, lesion or rash.  Neurological:     General: No focal deficit present.     Mental Status: She is alert and oriented to person, place, and time.     Sensory: No sensory deficit.     Motor: No weakness or abnormal muscle tone.     Coordination: Coordination normal.  Psychiatric:        Mood and Affect: Mood normal.        Behavior: Behavior normal.      Imaging: No results found.

## 2023-05-08 ENCOUNTER — Ambulatory Visit: Payer: Medicare HMO | Admitting: Orthopedic Surgery

## 2023-06-23 ENCOUNTER — Other Ambulatory Visit (HOSPITAL_COMMUNITY): Payer: Self-pay | Admitting: Family Medicine

## 2023-06-30 ENCOUNTER — Ambulatory Visit: Payer: 59 | Admitting: Internal Medicine

## 2023-07-03 ENCOUNTER — Other Ambulatory Visit: Payer: Self-pay | Admitting: Pulmonary Disease

## 2023-07-03 DIAGNOSIS — K219 Gastro-esophageal reflux disease without esophagitis: Secondary | ICD-10-CM

## 2023-07-21 ENCOUNTER — Telehealth (HOSPITAL_COMMUNITY): Payer: Self-pay | Admitting: *Deleted

## 2023-07-21 NOTE — Telephone Encounter (Signed)
 Called to confirm/remind patient of their appointment at the [facility] on 07/22/23.     Appointment:              [x] Confirmed             [] Left mess              [] No answer/No voice mail             [] Phone not in service   Patient reminded to bring all medications and/or complete list.   Confirmed patient has transportation. Gave directions, instructed to utilize valet parking.

## 2023-07-22 ENCOUNTER — Ambulatory Visit (HOSPITAL_COMMUNITY)
Admission: RE | Admit: 2023-07-22 | Discharge: 2023-07-22 | Disposition: A | Discharge status: Home / Self Care | Source: Ambulatory Visit | Attending: Adult Health | Admitting: Adult Health

## 2023-07-22 ENCOUNTER — Encounter (HOSPITAL_COMMUNITY): Payer: Self-pay

## 2023-07-22 VITALS — BP 122/80 | HR 75 | Ht 67.0 in | Wt 156.2 lb

## 2023-07-22 DIAGNOSIS — N183 Chronic kidney disease, stage 3 unspecified: Secondary | ICD-10-CM | POA: Diagnosis not present

## 2023-07-22 DIAGNOSIS — Z7984 Long term (current) use of oral hypoglycemic drugs: Secondary | ICD-10-CM | POA: Insufficient documentation

## 2023-07-22 DIAGNOSIS — I272 Pulmonary hypertension, unspecified: Secondary | ICD-10-CM | POA: Insufficient documentation

## 2023-07-22 DIAGNOSIS — K219 Gastro-esophageal reflux disease without esophagitis: Secondary | ICD-10-CM | POA: Insufficient documentation

## 2023-07-22 DIAGNOSIS — Z79899 Other long term (current) drug therapy: Secondary | ICD-10-CM | POA: Insufficient documentation

## 2023-07-22 DIAGNOSIS — I130 Hypertensive heart and chronic kidney disease with heart failure and stage 1 through stage 4 chronic kidney disease, or unspecified chronic kidney disease: Secondary | ICD-10-CM | POA: Diagnosis not present

## 2023-07-22 DIAGNOSIS — E785 Hyperlipidemia, unspecified: Secondary | ICD-10-CM | POA: Diagnosis not present

## 2023-07-22 DIAGNOSIS — I5022 Chronic systolic (congestive) heart failure: Secondary | ICD-10-CM

## 2023-07-22 DIAGNOSIS — J45909 Unspecified asthma, uncomplicated: Secondary | ICD-10-CM | POA: Diagnosis not present

## 2023-07-22 DIAGNOSIS — Z7902 Long term (current) use of antithrombotics/antiplatelets: Secondary | ICD-10-CM | POA: Diagnosis not present

## 2023-07-22 DIAGNOSIS — Z87891 Personal history of nicotine dependence: Secondary | ICD-10-CM | POA: Diagnosis not present

## 2023-07-22 DIAGNOSIS — Z953 Presence of xenogenic heart valve: Secondary | ICD-10-CM | POA: Diagnosis not present

## 2023-07-22 DIAGNOSIS — Z8673 Personal history of transient ischemic attack (TIA), and cerebral infarction without residual deficits: Secondary | ICD-10-CM | POA: Insufficient documentation

## 2023-07-22 DIAGNOSIS — Z7982 Long term (current) use of aspirin: Secondary | ICD-10-CM | POA: Insufficient documentation

## 2023-07-22 NOTE — Patient Instructions (Signed)
 Great to see you today!!!  Please continue current medications  Do the following things EVERYDAY: Weigh yourself in the morning before breakfast. Write it down and keep it in a log. Take your medicines as prescribed Eat low salt foods--Limit salt (sodium) to 2000 mg per day.  Stay as active as you can everyday Limit all fluids for the day to less than 2 liters  Your physician recommends that you schedule a follow-up appointment in: 3 months with an echocardiogram  If you have any questions or concerns before your next appointment please send us  a message through [place] or call our office at [phone].    TO LEAVE A MESSAGE FOR THE NURSE SELECT OPTION 2, PLEASE LEAVE A MESSAGE INCLUDING: YOUR NAME DATE OF BIRTH CALL BACK NUMBER REASON FOR CALL**this is important as we prioritize the call backs  YOU WILL RECEIVE A CALL BACK THE SAME DAY AS LONG AS YOU CALL BEFORE 4:00 PM  At the [facility], you and your health needs are our priority. As part of our continuing mission to provide you with exceptional heart care, we have created designated Provider Care Teams. These Care Teams include your primary Cardiologist (physician) and Advanced Practice Providers (APPs- Physician Assistants and Nurse Practitioners) who all work together to provide you with the care you need, when you need it.   You may see any of the following providers on your designated Care Team at your next follow up: Dr Jules Oar Dr Peder Bourdon Dr. Alwin Baars Dr. Arta Lark Amy Marijane Shoulders, NP Ruddy Corral, [facility] [place], [place] Dennise Fitz, NP [place] Lee, NP Shawnee Dellen, NP Luster Salters, PharmD Bevely Brush, PharmD   Please be sure to bring in all your medications bottles to every appointment.    Thank you for choosing [facility]

## 2023-07-22 NOTE — Progress Notes (Signed)
 ADVANCED HF CLINIC NOTE   Primary Care: Luba Running, FNP HF Cardiologist: Dr. Mitzie Anda  HPI: 73 y.o. female w/ h/o chronic diastolic heart failure, HTN, HLD, h/o TIA, GERD and asthma. Former smoker (quit 73 y/o). Noted to have high coronary calcium  score of 507 in March 2022. Referred to lipid clinic for aggressive tx of HLD but did not undergo cath in absence of symptoms. Echo 04/2018 showed normal LVEF and RV. No significant valvular dysfunction.    Recently referred back to cardiology for surgical clearance prior to undergoing planned EGD and EBUS (ordered given progressive dysphagia, wt loss and abnormal chest CT). She endorsed exertional dyspnea w/ inability to complete of activity w/o symptoms. Echo was done and showed drop in EF down to 40-45% w/ diffuse HK, severe LAE mod-severe MR, severely elevated RVSP, RV normal, mod-severe RAE and mod-severe TR. Subsequently referred for [facility].    [facility] 1/24 showed no significant CAD, only 10% pRCA narrowing, elevated R+ L heart filling pressures and marginal output, (PA saturation 48%, mean RA pressure 18 mmHg, PA pressure 50/28, mean PA pressure 37 mmHg, pulmonary capillary wedge pressure 35/36, mean pulmonary capillary wedge pressure 30 mmHg, cardiac output 3.92 L/min, cardiac index 2.27).    She was direct admitted from the cath lab for IV diuresis. TEE showed EF 30-35%, mild RV dysfunction, severe MR, moderate TR, moderate AI.  Cardiac MRI showed LV EF 18%, RV EF 25%, ?severe AI, moderate-severe MR (regurgitant fraction not likely accurate with low SV), non-coronary mid-wall LGE in the basal septal wall.  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  She had dysphagia and underwent EGD/colonoscopy showing esophageal candidiasis, unremarkable colonoscopy. Hospitalization also complicated by episode of SVT requiring adenosine . Her GDMT was titrated, but limited by soft BP. Structural Heart  team to review TEE to decide on mTEER candidacy. She was discharged home, weight 134 lbs.  Admitted 3/24 for scheduled MVR/AVR for severe MR, moderate AI and TR. S/p successful bioprosthetic MVR/AVR. AHF consulted for management of HF. Post op echo in 3/24 showed EF 30-35%, RV mod reduced, stable AV and MV prosthesis. After pressors/inotropes weaned off, GDMT titrated but limited by soft BPs. Had accelerated junction rhythm after surgery and started on amiodarone . She was discharged home, weight 137.5 lbs.  Echo in 6/24 showed EF 45-50%, normal RV, moderate TR, bioprosthetic MV with mean gradient 4.5 mmHg, bioprosthetic aortic valve with mean gradient 15 mmHg.   Today she returns for HF follow up.Overall feeling fine. Denies PND/Orthopnea. Every now gets short of breath. Appetite ok. No fever or chills. Weight at home has been stable. Taking all medications. Taking lasix  if weight goes up 2 pounds. Usually taking lasix  3-4 times a week.    PMH: 1. TIA/CVA: She has been on Plavix .  2. COPD: Prior smoker.  3. Mediastinal lymphadenopathy: PET-CT with hypermetabolic lymphadenopathy, ?lymphoproliferative disorder vs primary bronchogenic carcinoma vs sarcoidosis.  4. CKD stage 3 5. SVT: Occurred in 1/24, required adenosine .  6. Chronic systolic CHF:  Nonischemic cardiomyopathy.  - Echo (3/20): EF 50-55%, RV normal, mild MR - Echo (1/24): EF 40-45%, Severe LAE, mod-severe MR, severely elevated RVSP, RV normal, mod-severe RAE, mod-severe TR  - TEE (1/24): EF 30-35%, severe MR, moderate TR, moderate AI - Cardiac MRI (1/24): LVEF 18%, RVEF 25%, moderate AI visually but regurgitant fraction 49% suggests severe AI, moderate-severe MR,  non-coronary mid-wall LGE in the basal septal wall, cannot rule out cardiac sarcoidosis (unexplained mediastinal LAN) -  R/LHC (1/24): Mild nonobstructive CAD; RA mean 18, PA 50/28 (mean 37), PCWP 30, CO/CI: 3.92/ 2.27 - Post Op echo (3/24): EF 30-35%, RV moderately reduced -  Echo (6/24): EF 45-50%, normal RV, moderate TR, bioprosthetic MV with mean gradient 4.5 mmHg, bioprosthetic aortic valve with mean gradient 15 mmHg.  7. Valvular heart disease:  TEE in 1/24 showed severe MR.  The valve is abnormal with thickening of the leaflet tips and poor coapatation, ?underlying rheumatic or inflammatory etiology though not classic appearance for rheumatic heart disease and no mitral stenosis. Difficult to quantify mitral regurgitation by MRI due to low stroke volume (do not think regurgitant fraction is accurate). The aortic valve is also abnormal with thickening and doming.  Aortic insufficiency was moderate by TEE though regurgitant volume by cMRI suggested severe AI (I still suspect truly in moderate range).  - s/p bioprosthetic MVR and AVR 3/24 - Echo (3/24, post-op): EF 30-35%, RV mod reduced, stable AV and MV prosthesis. 8. GERD  Review of Systems: All systems reviewed and negative except as per HPI.   Current Outpatient Medications  Medication Sig Dispense Refill   albuterol  (PROVENTIL ) (2.5 MG/3ML) 0.083% nebulizer solution Take 3 mLs (2.5 mg total) by nebulization every 6 (six) hours as needed for wheezing or shortness of breath. 75 mL 12   aspirin  EC 81 MG tablet Take 1 tablet (81 mg total) by mouth daily. Swallow whole. 30 tablet 12   atorvastatin  (LIPITOR ) 80 MG tablet Take 1 tablet (80 mg total) by mouth daily at 6 PM. 30 tablet 2   busPIRone  (BUSPAR ) 10 MG tablet Take 10 mg by mouth 2 (two) times daily.     Cholecalciferol (VITAMIN D ) 50 MCG (2000 UT) tablet Take 2,000 Units by mouth daily.     clopidogrel  (PLAVIX ) 75 MG tablet Take 1 tablet (75 mg total) by mouth daily. 90 tablet 3   desloratadine (CLARINEX) 5 MG tablet Take 5 mg by mouth daily.     ezetimibe  (ZETIA ) 10 MG tablet Take 10 mg by mouth daily.     FLUoxetine  (PROZAC ) 20 MG capsule Take 20 mg by mouth daily.     fluticasone  (FLONASE ) 50 MCG/ACT nasal spray Place 1 spray into both nostrils daily.      fluticasone -salmeterol (WIXELA INHUB) 500-50 MCG/ACT AEPB Inhale 1 puff into the lungs in the morning and at bedtime. 60 each 6   furosemide  (LASIX ) 20 MG tablet Take 1 tablet (20 mg total) by mouth as needed. For weight gain of 3 lbs in 24 hours or 5 lbs in a week 90 tablet 3   losartan  (COZAAR ) 25 MG tablet TAKE 0.5 TABLETS BY MOUTH AT BEDTIME. 45 tablet 3   montelukast  (SINGULAIR ) 10 MG tablet Take 1 tablet (10 mg total) by mouth at bedtime. 30 tablet 11   pantoprazole  (PROTONIX ) 40 MG tablet TAKE 1 TABLET BY MOUTH EVERY DAY 90 tablet 1   potassium chloride  SA (KLOR-CON  M20) 20 MEQ tablet Take 1 tablet (20 mEq total) by mouth as directed. Take when taking lasix  dose     spironolactone  (ALDACTONE ) 25 MG tablet Take 1 tablet (25 mg total) by mouth daily. 90 tablet 3   No current facility-administered medications for this encounter.   Allergies  Allergen Reactions   Amoxicillin-Pot Clavulanate Itching   Lactose Intolerance (Gi) Diarrhea   Other Other (See Comments)    08/06/2018 -    06/05/2016 -    09/15/2014 -    06/16/2014 -   Social History  Socioeconomic History   Marital status: Widowed    Spouse name: Not on file   Number of children: Not on file   Years of education: Not on file   Highest education level: Not on file  Occupational History   Not on file  Tobacco Use   Smoking status: Former    Current packs/day: 0.00    Average packs/day: 0.5 packs/day for 30.0 years (15.0 ttl pk-yrs)    Types: Cigarettes    Start date: 11/09/1968    Quit date: 11/10/1998    Years since quitting: 24.7   Smokeless tobacco: Never  Vaping Use   Vaping status: Never Used  Substance and Sexual Activity   Alcohol use: Yes    Comment: occasional   Drug use: No   Sexual activity: Not Currently  Other Topics Concern   Not on file  Social History Narrative   Not on file   Social Drivers of Health   Financial Resource Strain: Not at Risk (12/19/2022)   Received from [employer]    Financial Resource Strain: 1  Food Insecurity: Not at Risk (12/19/2022)   Received from [facility] Insecurity    Food: 1  Transportation Needs: Not at Risk (12/19/2022)   Received from [employer] Needs    Transportation: 1  Physical Activity: Not on File (06/07/2021)   Received from [place], [place]   Physical Activity    Physical Activity: 0  Stress: Not on File (06/07/2021)   Received from [facility], [place]   Stress    Stress: 0  Social Connections: Not on File (11/02/2022)   Received from [facility]   Social Connections    Connectedness: 0  Intimate Partner Violence: Not At Risk (05/09/2022)   Humiliation, Afraid, Rape, and Kick questionnaire    Fear of Current or Ex-Partner: No    Emotionally Abused: No    Physically Abused: No    Sexually Abused: No   Family History  Problem Relation Age of Onset   Hypertension Mother    Hypertension Father    Colon cancer Father    Hypertension Sister    BP 122/80   Pulse 75   Ht 5\' 7"  (1.702 m)   Wt 70.9 kg (156 lb 3.2 oz)   SpO2 97%   BMI 24.46 kg/m   Wt Readings from Last 3 Encounters:  07/22/23 70.9 kg (156 lb 3.2 oz)  01/09/23 64.4 kg (142 lb)  12/17/22 63.5 kg (140 lb)   PHYSICAL EXAM: General:   No resp difficulty Neck: supple. no JVD.  Cor: PMI nondisplaced. Regular rate & rhythm. No rubs, gallops or murmurs. Lungs: clear Abdomen: soft, nontender, nondistended.  Extremities: no cyanosis, clubbing, rash, edema Neuro: alert & oriented x3  ASSESSMENT & PLAN: 1. Valvular heart disease: TEE showed severe MR.  The valve is abnormal with thickening of the leaflet tips and poor coapatation, ?underlying rheumatic or inflammatory etiology though not classic appearance for rheumatic heart disease and no mitral stenosis. The aortic valve was also abnormal with thickening and doming.  Aortic insufficiency was moderate by TEE though regurgitant volume by cMRI suggested severe AI (I still suspect truly in  moderate range). The structural heart team reviewed TEE images and felt she is not suitable for mTEER, so she underwent bioprosthetic MVR/AVR 05/07/22.  Echo in 6/24 showed stable bioprosthetic aortic and mitral valves.  Repeat Echo next visit.  - Will need SBE prophylaxis with dental work. - Continue ASA  81 and Plavix  (was on Plavix  pre-op for CVA).  - She declined cardiac rehab. 2. Chronic Systolic CHF: Echo in 3/20 showed low normal EF 50-55%, no significant MR. Echo in 1/24 EF 40-45%, mild RV dilation, moderate pulmonary HTN, mod-severe MR and moderate-severe TR. RHC/LHC (1/24) with elevated left and right heart filling pressures, pulmonary venous hypertension, preserved cardiac output.  NICM. Cause is uncertain, ? valvular disease. TEE done 03/01/22 showed EF 30-35%, mild RV dysfunction, severe MR, moderate TR, moderate AI.  Cardiac MRI 1/12 showed LV EF 18%, RV EF 25%, ? severe AI, moderate-severe MR (regurgitant fraction not likely accurate with low SV), non-coronary mid-wall LGE in the basal septal wall.  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 Post-op Echo (3/24) showed EF 30-35%, RV mod reduced. Echo in 6/24 showed improved EF 45-50%, normal RV, moderate TR, bioprosthetic MV with mean gradient 4.5 mmHg, bioprosthetic aortic valve with mean gradient 15 mmHg.   NYHA II. Appears euvolemic. Continue lasix  20 mg daily as needed.  - Continue  Farxiga  10 mg daily - Continue losartan  25 mg daily, BP did not tolerate Entresto  in the past.  - Continue spironolactone  25 mg daily. - Hold off on beta blocker with 1st degree AVB. 3. Pulmonary hypertension: Group 2 pulmonary venous hypertension.  4. H/o TIA/CVA: Previously on Plavix  long term. - Continue Plavix  + ASA 81.  5. CKD stage 3: Baseline SCr 1.3-1.5 Most recent creatinine 1.7.  Continue Farxiga .  She has been referred to [facility]  6. H/O Accelerated junctional rhythm: Post-Op.    Follow up in 3-4 months with Dr Mitzie Anda and an Echo.  '  Zackrey Dyar NP-C  07/22/23

## 2023-07-24 ENCOUNTER — Other Ambulatory Visit (HOSPITAL_COMMUNITY): Payer: Self-pay | Admitting: Cardiology

## 2023-08-05 ENCOUNTER — Other Ambulatory Visit: Payer: Medicare HMO

## 2023-08-13 ENCOUNTER — Ambulatory Visit: Admitting: Pulmonary Disease

## 2023-09-01 ENCOUNTER — Other Ambulatory Visit: Payer: Self-pay | Admitting: Nephrology

## 2023-09-01 DIAGNOSIS — N1832 Chronic kidney disease, stage 3b: Secondary | ICD-10-CM

## 2023-09-03 ENCOUNTER — Ambulatory Visit
Admission: RE | Admit: 2023-09-03 | Discharge: 2023-09-03 | Disposition: A | Discharge status: Home / Self Care | Source: Ambulatory Visit | Attending: Nephrology | Admitting: Nephrology

## 2023-09-03 DIAGNOSIS — N1832 Chronic kidney disease, stage 3b: Secondary | ICD-10-CM

## 2023-09-14 ENCOUNTER — Other Ambulatory Visit (HOSPITAL_COMMUNITY): Payer: Self-pay | Admitting: Cardiology

## 2023-09-15 ENCOUNTER — Other Ambulatory Visit: Payer: Self-pay | Admitting: Nurse Practitioner

## 2023-09-15 ENCOUNTER — Other Ambulatory Visit (HOSPITAL_COMMUNITY): Payer: Self-pay | Admitting: Cardiology

## 2023-09-15 DIAGNOSIS — I5022 Chronic systolic (congestive) heart failure: Secondary | ICD-10-CM

## 2023-10-28 ENCOUNTER — Telehealth (HOSPITAL_COMMUNITY): Payer: Self-pay | Admitting: Cardiology

## 2023-10-28 NOTE — Telephone Encounter (Signed)
 Called to confirm/remind patient of their appointment at the [facility] on 10/28/23.   Appointment:   [] Confirmed  [x] Left mess   [] No answer/No voice mail  [] VM Full/unable to leave message  [] Phone not in service  Patient reminded to bring all medications and/or complete list.  Confirmed patient has transportation. Gave directions, instructed to utilize valet parking.

## 2023-10-29 ENCOUNTER — Ambulatory Visit (HOSPITAL_COMMUNITY): Payer: Self-pay | Admitting: Cardiology

## 2023-10-29 ENCOUNTER — Ambulatory Visit (HOSPITAL_BASED_OUTPATIENT_CLINIC_OR_DEPARTMENT_OTHER)
Admission: RE | Admit: 2023-10-29 | Discharge: 2023-10-29 | Disposition: A | Discharge status: Home / Self Care | Source: Ambulatory Visit | Attending: Cardiology | Admitting: Cardiology

## 2023-10-29 ENCOUNTER — Encounter (HOSPITAL_COMMUNITY): Payer: Self-pay | Admitting: Cardiology

## 2023-10-29 ENCOUNTER — Other Ambulatory Visit (HOSPITAL_COMMUNITY): Payer: Self-pay

## 2023-10-29 ENCOUNTER — Ambulatory Visit (HOSPITAL_COMMUNITY)
Admission: RE | Admit: 2023-10-29 | Discharge: 2023-10-29 | Disposition: A | Discharge status: Home / Self Care | Source: Ambulatory Visit | Attending: Family | Admitting: Family

## 2023-10-29 VITALS — BP 128/80 | HR 77 | Wt 158.6 lb

## 2023-10-29 DIAGNOSIS — Z87891 Personal history of nicotine dependence: Secondary | ICD-10-CM | POA: Insufficient documentation

## 2023-10-29 DIAGNOSIS — Z7902 Long term (current) use of antithrombotics/antiplatelets: Secondary | ICD-10-CM | POA: Diagnosis not present

## 2023-10-29 DIAGNOSIS — I130 Hypertensive heart and chronic kidney disease with heart failure and stage 1 through stage 4 chronic kidney disease, or unspecified chronic kidney disease: Secondary | ICD-10-CM | POA: Insufficient documentation

## 2023-10-29 DIAGNOSIS — K219 Gastro-esophageal reflux disease without esophagitis: Secondary | ICD-10-CM | POA: Diagnosis not present

## 2023-10-29 DIAGNOSIS — Z952 Presence of prosthetic heart valve: Secondary | ICD-10-CM | POA: Insufficient documentation

## 2023-10-29 DIAGNOSIS — E785 Hyperlipidemia, unspecified: Secondary | ICD-10-CM | POA: Diagnosis not present

## 2023-10-29 DIAGNOSIS — I2722 Pulmonary hypertension due to left heart disease: Secondary | ICD-10-CM | POA: Diagnosis not present

## 2023-10-29 DIAGNOSIS — Z8673 Personal history of transient ischemic attack (TIA), and cerebral infarction without residual deficits: Secondary | ICD-10-CM | POA: Diagnosis not present

## 2023-10-29 DIAGNOSIS — I5022 Chronic systolic (congestive) heart failure: Secondary | ICD-10-CM | POA: Insufficient documentation

## 2023-10-29 DIAGNOSIS — N183 Chronic kidney disease, stage 3 unspecified: Secondary | ICD-10-CM | POA: Diagnosis not present

## 2023-10-29 DIAGNOSIS — J4489 Other specified chronic obstructive pulmonary disease: Secondary | ICD-10-CM | POA: Insufficient documentation

## 2023-10-29 DIAGNOSIS — Z7982 Long term (current) use of aspirin: Secondary | ICD-10-CM | POA: Insufficient documentation

## 2023-10-29 DIAGNOSIS — I361 Nonrheumatic tricuspid (valve) insufficiency: Secondary | ICD-10-CM | POA: Insufficient documentation

## 2023-10-29 DIAGNOSIS — G4730 Sleep apnea, unspecified: Secondary | ICD-10-CM | POA: Insufficient documentation

## 2023-10-29 LAB — ECHOCARDIOGRAM COMPLETE
AR max vel: 1.06 cm2
AV Area VTI: 1.01 cm2
AV Area mean vel: 1.02 cm2
AV Mean grad: 10 mmHg
AV Peak grad: 18 mmHg
Ao pk vel: 2.12 m/s
MV VTI: 1.35 cm2
S' Lateral: 3.6 cm
Single Plane A4C EF: 41.1 %

## 2023-10-29 LAB — BASIC METABOLIC PANEL WITH GFR
Anion gap: 9 (ref 5–15)
BUN: 16 mg/dL (ref 8–23)
CO2: 23 mmol/L (ref 22–32)
Calcium: 9.6 mg/dL (ref 8.9–10.3)
Chloride: 106 mmol/L (ref 98–111)
Creatinine, Ser: 1.25 mg/dL — ABNORMAL HIGH (ref 0.44–1.00)
GFR, Estimated: 45 mL/min — ABNORMAL LOW (ref >60)
Glucose, Bld: 107 mg/dL — ABNORMAL HIGH (ref 70–99)
Potassium: 4 mmol/L (ref 3.5–5.1)
Sodium: 138 mmol/L (ref 135–145)

## 2023-10-29 LAB — BRAIN NATRIURETIC PEPTIDE: B Natriuretic Peptide: 56.8 pg/mL (ref 0.0–100.0)

## 2023-10-29 NOTE — Progress Notes (Signed)
  Echocardiogram 2D Echocardiogram has been performed.  Shannon [name] 10/29/2023, 12:15 PM

## 2023-10-29 NOTE — Patient Instructions (Signed)
 There has been no changes to your medications.  Labs done today, your results will be available in MyChart, we will contact you for abnormal readings.  Your physician recommends that you schedule a follow-up appointment in: 4 months.  If you have any questions or concerns before your next appointment please send [place] a message through [employer] or call our office at [phone].    TO LEAVE A MESSAGE FOR THE NURSE SELECT OPTION 2, PLEASE LEAVE A MESSAGE INCLUDING: YOUR NAME DATE OF BIRTH CALL BACK NUMBER REASON FOR CALL**this is important as we prioritize the call backs  YOU WILL RECEIVE A CALL BACK THE SAME DAY AS LONG AS YOU CALL BEFORE 4:00 PM  At the [facility], you and your health needs are our priority. As part of our continuing mission to provide you with exceptional heart care, we have created designated Provider Care Teams. These Care Teams include your primary Cardiologist (physician) and Advanced Practice Providers (APPs- Physician Assistants and Nurse Practitioners) who all work together to provide you with the care you need, when you need it.   You may see any of the following providers on your designated Care Team at your next follow up: Dr Arvilla Meres Dr Marca Ancona Dr. Dorthula Nettles Dr. Clearnce Hasten Amy Filbert Schilder, NP Robbie Lis, [place] [facility] [place], [place] Brynda Peon, NP [place] Lee, NP Clarisa Kindred, NP Karle Plumber, PharmD Enos Fling, PharmD   Please be sure to bring in all your medications bottles to every appointment.    Thank you for choosing [facility]

## 2023-10-30 ENCOUNTER — Ambulatory Visit (HOSPITAL_COMMUNITY): Payer: Self-pay | Admitting: Adult Health

## 2023-10-30 NOTE — Progress Notes (Signed)
 ADVANCED HF CLINIC NOTE   Primary Care: Duwaine Annabella SAILOR, FNP HF Cardiologist: Dr. Rolan  Chief complaint: CHF  HPI: 73 y.o. female w/ h/o chronic diastolic heart failure, HTN, HLD, h/o TIA, GERD and asthma. Former smoker (quit 73 y/o). Noted to have high coronary calcium  score of 507 in March 2022. Referred to lipid clinic for aggressive tx of HLD but did not undergo cath in absence of symptoms. Echo 04/2018 showed normal LVEF and RV. No significant valvular dysfunction.    Recently referred back to cardiology for surgical clearance prior to undergoing planned EGD and EBUS (ordered given progressive dysphagia, wt loss and abnormal chest CT). She endorsed exertional dyspnea w/ inability to complete of activity w/o symptoms. Echo was done and showed drop in EF down to 40-45% w/ diffuse HK, severe LAE mod-severe MR, severely elevated RVSP, RV normal, mod-severe RAE and mod-severe TR. Subsequently referred for [facility].    [facility] 1/24 showed no significant CAD, only 10% pRCA narrowing, elevated R+ L heart filling pressures and marginal output, (PA saturation 48%, mean RA pressure 18 mmHg, PA pressure 50/28, mean PA pressure 37 mmHg, pulmonary capillary wedge pressure 35/36, mean pulmonary capillary wedge pressure 30 mmHg, cardiac output 3.92 L/min, cardiac index 2.27).  She was direct admitted from the cath lab for IV diuresis. TEE showed EF 30-35%, mild RV dysfunction, severe MR, moderate TR, moderate AI. Cardiac MRI showed LV EF 18%, RV EF 25%, ?severe AI, moderate-severe MR (regurgitant fraction not likely accurate with low SV), non-coronary mid-wall LGE in the basal septal wall.  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  She had dysphagia and underwent EGD/colonoscopy showing esophageal candidiasis, unremarkable colonoscopy. Hospitalization also complicated by episode of SVT requiring adenosine . Her GDMT was titrated, but limited by soft BP.   She was discharged home, weight 134 lbs.  Admitted 3/24 for scheduled MVR/AVR for severe MR, moderate AI and TR. S/p successful bioprosthetic MVR/AVR. AHF consulted for management of HF. Post op echo in 3/24 showed EF 30-35%, RV mod reduced, stable AV and MV prosthesis. After pressors/inotropes weaned off, GDMT titrated but limited by soft BPs. Had accelerated junction rhythm after surgery and started on amiodarone . She was discharged home, weight 137.5 lbs.  Echo in 6/24 showed EF 45-50%, normal RV, moderate TR, bioprosthetic MV with mean gradient 4.5 mmHg, bioprosthetic aortic valve with mean gradient 15 mmHg.   Echo was done today and reviewed, EF 50%, septal-lateral dyssynchrony, mild RV enlargement/mild RV dysfunction, bioprosthetic MV with mean gradient 3 and no significant MR, bioprosthetic AoV with mean gradient 10 mmHg, moderate TR, IVC normal in size.   Today she returns for HF follow up.  Weight up 2 lbs.  She gets short of breath pulling trashcans back and forth to the street but no dyspnea walking on flat ground.  No chest pain.  No lightheadedness or palpitations.  She is in NSR today.    ECG (personally reviewed): NSR, IVCD 118 msec, 1st degree AVB 218 msec  Labs (4/25): LDL 92 Labs (7/25): K 4.8, creatinine 1.5  PMH: 1. TIA/CVA: She has been on Plavix .  2. COPD: Prior smoker.  3. Mediastinal lymphadenopathy: PET-CT with hypermetabolic lymphadenopathy, ?lymphoproliferative disorder vs primary bronchogenic carcinoma vs sarcoidosis.  4. CKD stage 3 5. SVT: Occurred in 1/24, required adenosine .  6. Chronic systolic CHF:  Nonischemic cardiomyopathy.  - Echo (3/20): EF 50-55%, RV normal, mild MR - Echo (1/24): EF 40-45%, Severe LAE, mod-severe MR, severely elevated  RVSP, RV normal, mod-severe RAE, mod-severe TR  - TEE (1/24): EF 30-35%, severe MR, moderate TR, moderate AI - Cardiac MRI (1/24): LVEF 18%, RVEF 25%, moderate AI visually but regurgitant fraction 49% suggests severe AI,  moderate-severe MR,  non-coronary mid-wall LGE in the basal septal wall, cannot rule out cardiac sarcoidosis (unexplained mediastinal LAN) - R/LHC (1/24): Mild nonobstructive CAD; RA mean 18, PA 50/28 (mean 37), PCWP 30, CO/CI: 3.92/ 2.27 - Post Op echo (3/24): EF 30-35%, RV moderately reduced - Echo (6/24): EF 45-50%, normal RV, moderate TR, bioprosthetic MV with mean gradient 4.5 mmHg, bioprosthetic aortic valve with mean gradient 15 mmHg.  - Echo (9/25): EF 50%, septal-lateral dyssynchrony, mild RV enlargement/mild RV dysfunction, bioprosthetic MV with mean gradient 3 and no significant MR, bioprosthetic AoV with mean gradient 10 mmHg, moderate TR, IVC normal in size.  7. Valvular heart disease:  TEE in 1/24 showed severe MR.  The valve is abnormal with thickening of the leaflet tips and poor coapatation, ?underlying rheumatic or inflammatory etiology though not classic appearance for rheumatic heart disease and no mitral stenosis. Difficult to quantify mitral regurgitation by MRI due to low stroke volume (do not think regurgitant fraction is accurate). The aortic valve is also abnormal with thickening and doming.  Aortic insufficiency was moderate by TEE though regurgitant volume by cMRI suggested severe AI (I still suspect truly in moderate range).  - s/p bioprosthetic MVR and AVR 3/24 - Echo (3/24, post-op): EF 30-35%, RV mod reduced, stable AV and MV prosthesis. - Echo (9/25): Stable bioprosthetic aortic and mitral valves, moderate TR.  8. GERD  Review of Systems: All systems reviewed and negative except as per HPI.   Current Outpatient Medications  Medication Sig Dispense Refill   albuterol  (PROVENTIL ) (2.5 MG/3ML) 0.083% nebulizer solution Take 3 mLs (2.5 mg total) by nebulization every 6 (six) hours as needed for wheezing or shortness of breath. 75 mL 12   aspirin  EC 81 MG tablet Take 1 tablet (81 mg total) by mouth daily. Swallow whole. 30 tablet 12   atorvastatin  (LIPITOR ) 80 MG tablet  Take 1 tablet (80 mg total) by mouth daily at 6 PM. 30 tablet 2   Cholecalciferol (VITAMIN D ) 50 MCG (2000 UT) tablet Take 2,000 Units by mouth daily.     clopidogrel  (PLAVIX ) 75 MG tablet TAKE 1 TABLET BY MOUTH EVERY DAY 90 tablet 3   desloratadine (CLARINEX) 5 MG tablet Take 5 mg by mouth daily.     ezetimibe  (ZETIA ) 10 MG tablet Take 10 mg by mouth daily.     FARXIGA  10 MG TABS tablet TAKE 1 TABLET BY MOUTH EVERY DAY 90 tablet 3   FLUoxetine  (PROZAC ) 20 MG capsule Take 20 mg by mouth daily.     fluticasone  (FLONASE ) 50 MCG/ACT nasal spray Place 1 spray into both nostrils as needed for allergies or rhinitis.     losartan  (COZAAR ) 25 MG tablet Take 25 mg by mouth at bedtime.     montelukast  (SINGULAIR ) 10 MG tablet Take 1 tablet (10 mg total) by mouth at bedtime. 30 tablet 11   pantoprazole  (PROTONIX ) 40 MG tablet TAKE 1 TABLET BY MOUTH EVERY DAY 90 tablet 1   spironolactone  (ALDACTONE ) 25 MG tablet TAKE 1 TABLET (25 MG TOTAL) BY MOUTH DAILY. 90 tablet 3   fluticasone -salmeterol (WIXELA INHUB) 500-50 MCG/ACT AEPB Inhale 1 puff into the lungs in the morning and at bedtime. 60 each 6   furosemide  (LASIX ) 20 MG tablet Take 1 tablet (20 mg  total) by mouth as needed. For weight gain of 3 lbs in 24 hours or 5 lbs in a week (Patient not taking: Reported on 10/29/2023) 90 tablet 3   KLOR-CON  M20 20 MEQ tablet TAKE 1 TABLET BY MOUTH EVERY DAY (Patient not taking: Reported on 10/29/2023) 90 tablet 3   No current facility-administered medications for this encounter.   Allergies  Allergen Reactions   Amoxicillin-Pot Clavulanate Itching   Lactose Intolerance (Gi) Diarrhea   Other Other (See Comments)    08/06/2018 -    06/05/2016 -    09/15/2014 -    06/16/2014 -   Social History   Socioeconomic History   Marital status: Widowed    Spouse name: Not on file   Number of children: Not on file   Years of education: Not on file   Highest education level: Not on file  Occupational History   Not on file   Tobacco Use   Smoking status: Former    Current packs/day: 0.00    Average packs/day: 0.5 packs/day for 30.0 years (15.0 ttl pk-yrs)    Types: Cigarettes    Start date: 11/09/1968    Quit date: 11/10/1998    Years since quitting: 24.9   Smokeless tobacco: Never  Vaping Use   Vaping status: Never Used  Substance and Sexual Activity   Alcohol use: Yes    Comment: occasional   Drug use: No   Sexual activity: Not Currently  Other Topics Concern   Not on file  Social History Narrative   Not on file   Social Drivers of Health   Financial Resource Strain: Not at Risk (12/19/2022)   Received from [employer]    How hard is it for you to pay for the very basics like food, housing, heating, medical care, and medications?: 1  Food Insecurity: Not at Risk (12/19/2022)   Received from [facility] Insecurity    Within the past 12 months, you worried that your food would run out before you got money to buy more.: 1  Transportation Needs: Not at Risk (12/19/2022)   Received from [employer] Needs    In the past 12 months, has lack of transportation kept you from medical appointments, meetings, work or from getting things needed for daily living?: 1  Physical Activity: Not on File (06/07/2021)   Received from [facility]   Physical Activity    Physical Activity: 0  Stress: Not on File (06/07/2021)   Received from [facility]   Stress    Stress: 0  Social Connections: Not on File (11/02/2022)   Received from [facility]   Social Connections    Connectedness: 0  Intimate Partner Violence: Not At Risk (05/09/2022)   Humiliation, Afraid, Rape, and Kick questionnaire    Fear of Current or Ex-Partner: No    Emotionally Abused: No    Physically Abused: No    Sexually Abused: No   Family History  Problem Relation Age of Onset   Hypertension Mother    Hypertension Father    Colon cancer Father    Hypertension Sister    BP 128/80   Pulse 77   Wt 71.9 kg (158 lb 9.6 oz)    SpO2 97%   BMI 24.84 kg/m   Wt Readings from Last 3 Encounters:  10/29/23 71.9 kg (158 lb 9.6 oz)  07/22/23 70.9 kg (156 lb 3.2 oz)  01/09/23 64.4 kg (142 lb)   PHYSICAL EXAM: General: NAD Neck:  No JVD, no thyromegaly or thyroid  nodule.  Lungs: Occasional rhonchi CV: Nondisplaced PMI.  Heart regular S1/S2, no S3/S4, 1/6 SEM RUSB.  No peripheral edema.  No carotid bruit.  Normal pedal pulses.  Abdomen: Soft, nontender, no hepatosplenomegaly, no distention.  Skin: Intact without lesions or rashes.  Neurologic: Alert and oriented x 3.  Psych: Normal affect. Extremities: No clubbing or cyanosis.  HEENT: Normal.   ASSESSMENT & PLAN: 1. Valvular heart disease: TEE in 1/24 showed severe MR.  The valve was abnormal with thickening of the leaflet tips and poor coapatation, ?underlying rheumatic or inflammatory etiology though not classic appearance for rheumatic heart disease and no mitral stenosis. The aortic valve was also abnormal with thickening and doming.  Aortic insufficiency was moderate by TEE though regurgitant volume by cMRI suggested severe AI. The structural heart team reviewed TEE images and felt she is not suitable for mTEER, so she underwent bioprosthetic MVR/AVR 05/07/22.  Echo done today showed stable bioprosthetic aortic and mitral valves with normal function, there was probably moderate TR.  - Will need SBE prophylaxis with dental work. - Continue ASA 81 and Plavix  (was on Plavix  pre-op for CVA).  2. Chronic Systolic CHF: Nonischemic cardiomyopathy.  Echo in 3/20 showed low normal EF 50-55%, no significant MR. Echo in 1/24 EF 40-45%, mild RV dilation, moderate pulmonary HTN, mod-severe MR and moderate-severe TR. RHC/LHC (1/24) with elevated left and right heart filling pressures, pulmonary venous hypertension, preserved cardiac output.  TEE done 03/01/22 showed EF 30-35%, mild RV dysfunction, severe MR, moderate TR, moderate AI.  Cardiac MRI 1/12 showed LV EF 18%, RV EF 25%, ?  severe AI, moderate-severe MR (regurgitant fraction not likely accurate with low SV), non-coronary mid-wall LGE in the basal septal wall.  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 Overall, NICM was probably primarily due to valvular heart disease.  Post-op Echo (3/24) showed EF 30-35%, RV mod reduced. Echo in 6/24 showed improved EF 45-50%, normal RV, moderate TR, bioprosthetic MV with mean gradient 4.5 mmHg, bioprosthetic aortic valve with mean gradient 15 mmHg.  Echo today showed EF 50%, septal-lateral dyssynchrony, mild RV enlargement/mild RV dysfunction, bioprosthetic MV with mean gradient 3 and no significant MR, bioprosthetic AoV with mean gradient 10 mmHg, moderate TR, IVC normal in size.  NYHA II, not volume overloaded on exam. - She does not appear to need Lasix .  - Continue  Farxiga  10 mg daily - Continue losartan  25 mg daily, BP did not tolerate Entresto  in the past.  - Continue spironolactone  25 mg daily.  BMET/BNP today.  - Hold off on beta blocker with 1st degree AVB (though this is shorter today). 3. Pulmonary hypertension: Group 2 pulmonary venous hypertension.  4. H/o TIA/CVA:On Plavix  long term. - Continue Plavix  + ASA 81.  5. CKD stage 3: Baseline SCr 1.3-1.5 - Continue Farxiga .  - She has been referred to [facility]  - BMET today.  6. H/O Accelerated junctional rhythm: Post-Op. NSR today.   Follow up in 4 months with APP.   I spent 31 minutes reviewing data, interviewing patient and family, and organizing the orders/followup.  Ezra Shuck 10/30/23

## 2023-11-06 IMAGING — CT CT CHEST W/O CM
2 of 5 series · 15 of 36 positions shown, 18 images · non-contrast
Comparison: Chest x-ray from the previous day.

CLINICAL DATA: New left upper lobe wedge-shaped density.



[Series 4: thins · axial · 0.77mm/px · z∈[+1147,+1399]mm · 12 of 357 slices shown, 15 images]
[im 21/357  mediastinal]
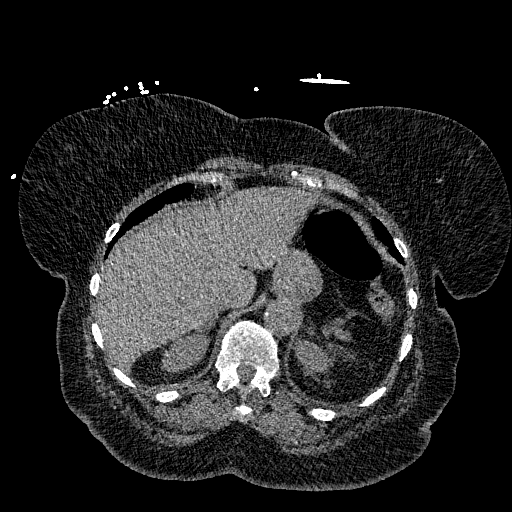
[im 21/357  lung]
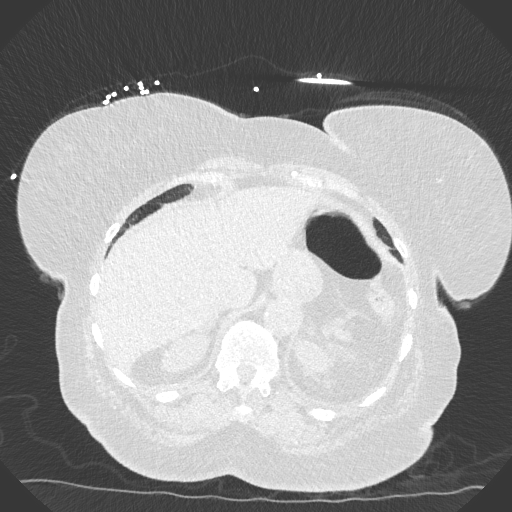
[im 63/357  lung]
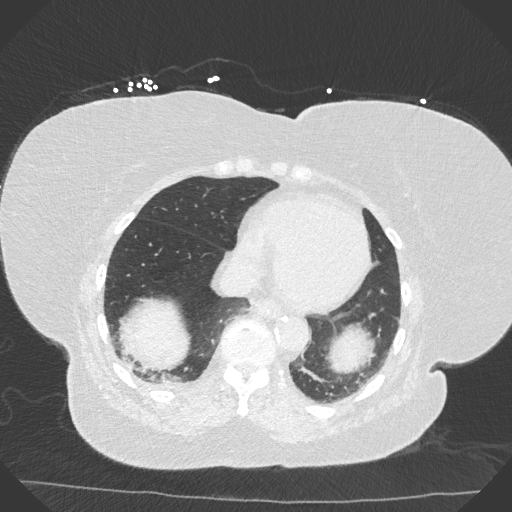
[im 84/357  lung]
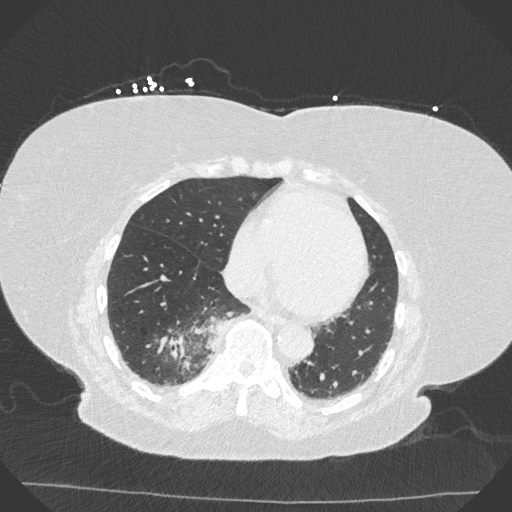
[im 105/357  lung]
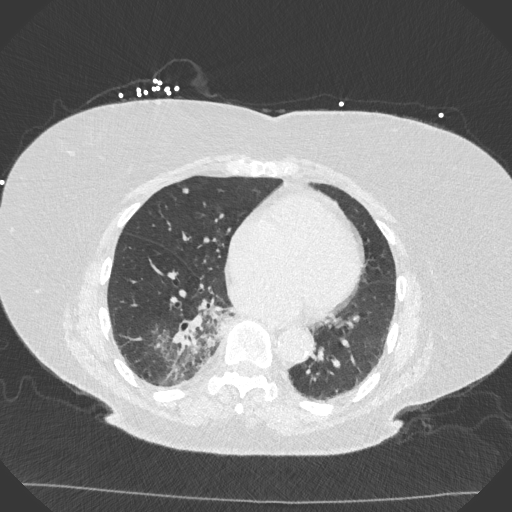
[im 147/357  mediastinal]
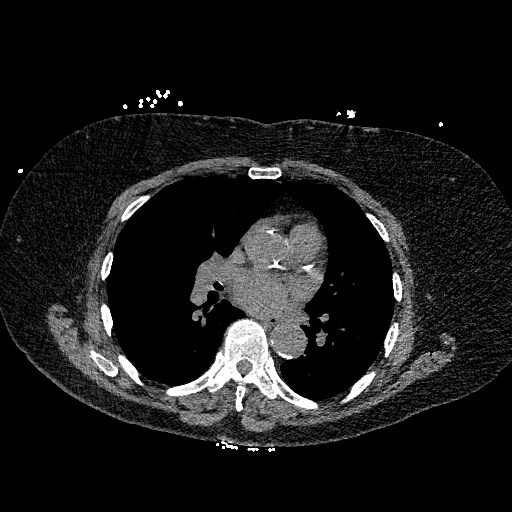
[im 147/357  lung]
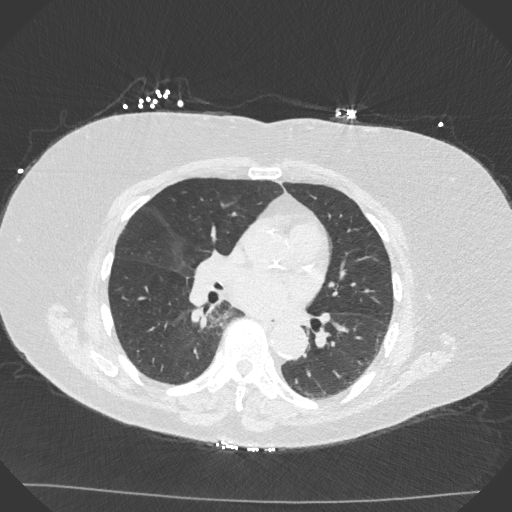
[im 168/357  lung]
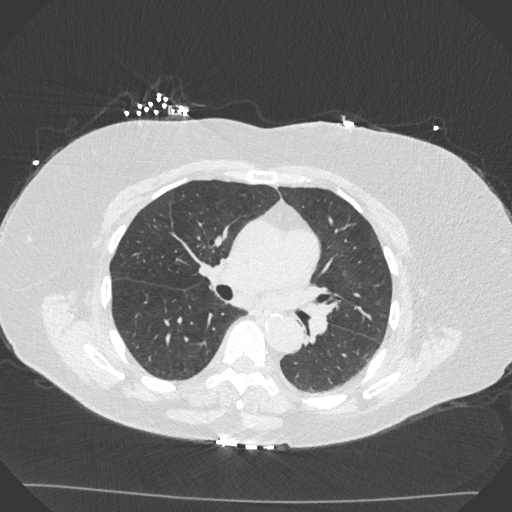
[im 189/357  lung]
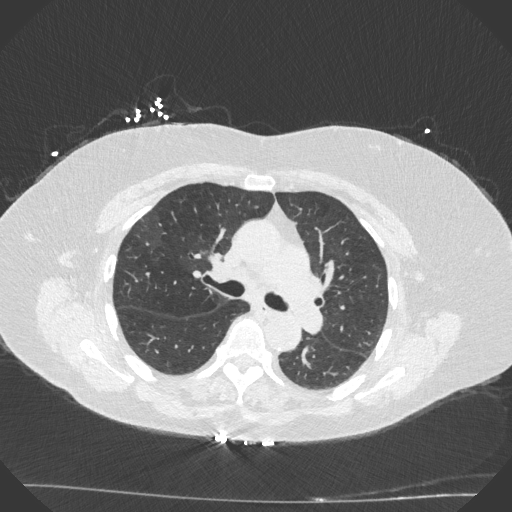
[im 231/357  lung]
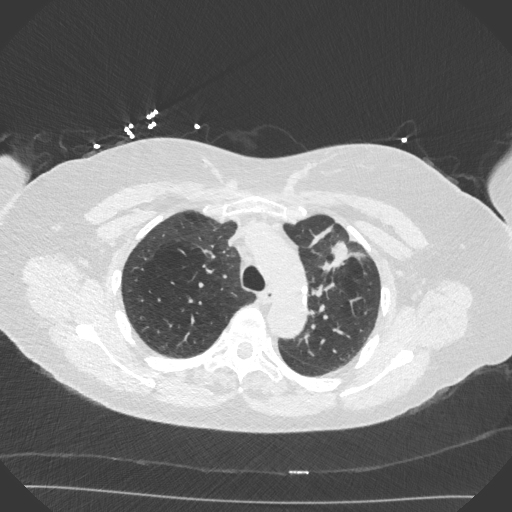
[im 252/357  mediastinal]
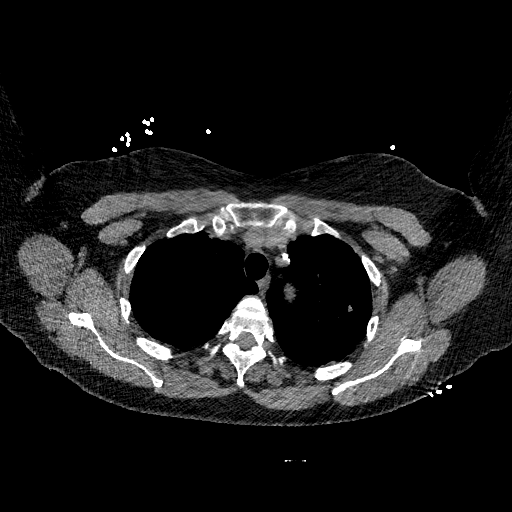
[im 252/357  lung]
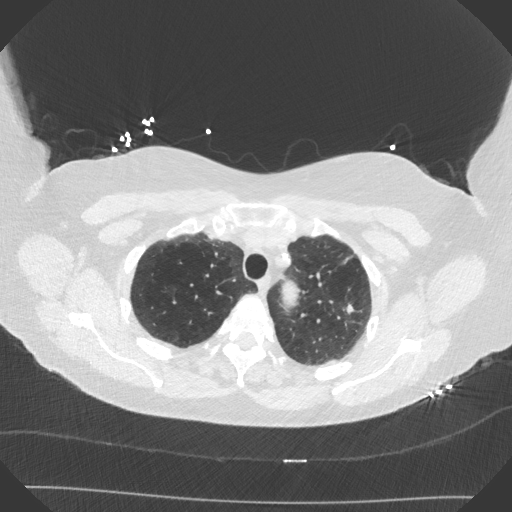
[im 273/357  lung]
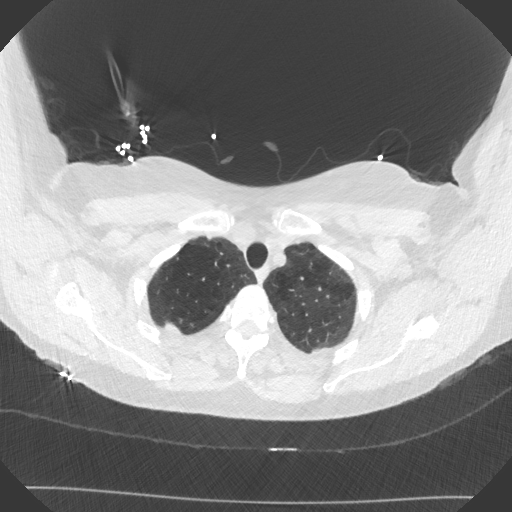
[im 315/357  lung]
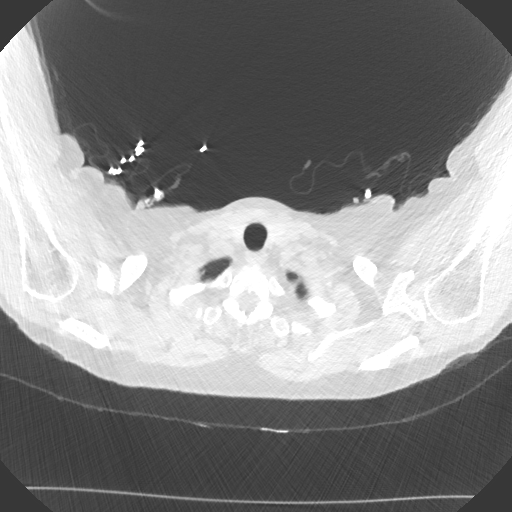
[im 336/357  lung]
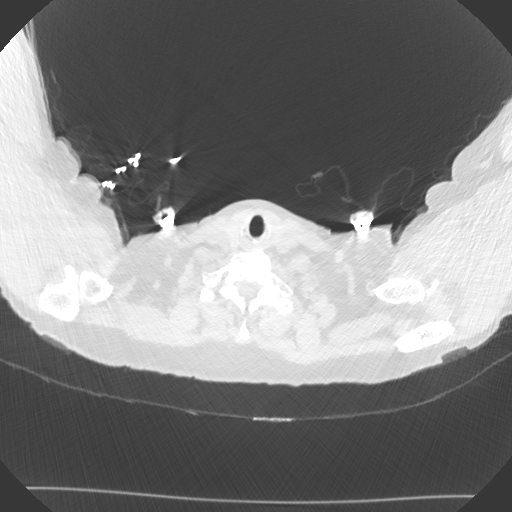

[Series 6: coronal · coronal · 0.59mm/px · 3 of 86 slices shown]
[im 18/86  lung]
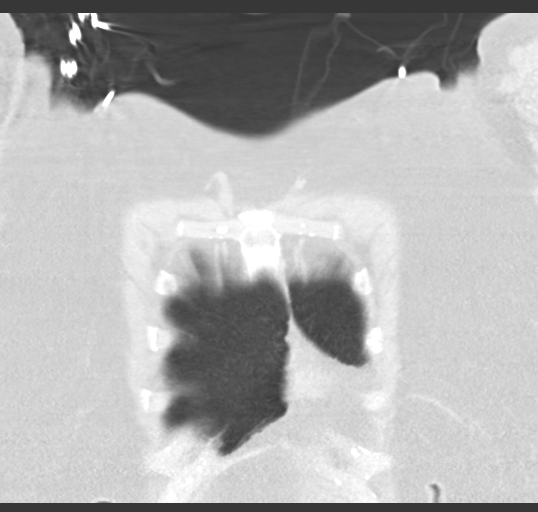
[im 35/86  lung]
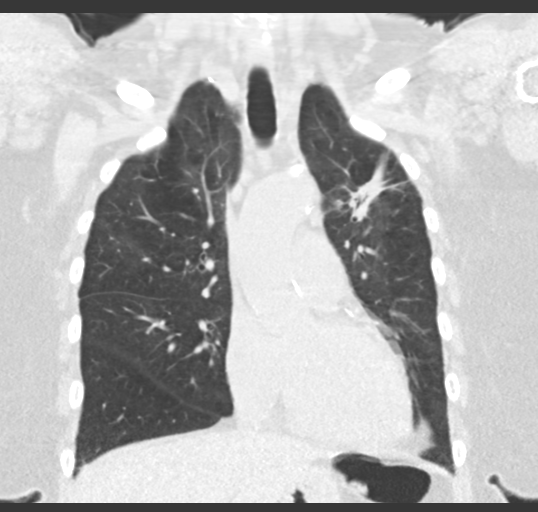
[im 52/86  lung]
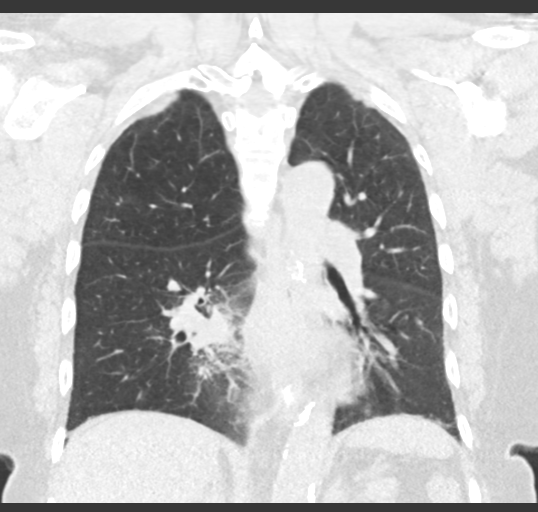

[15 of 36 positions shown; findings below may reference images not displayed]

FINDINGS: Cardiovascular: Limited due to lack of IV contrast. Atherosclerotic
calcifications are noted without aneurysmal dilatation of the aorta.
Coronary calcifications are seen. No cardiac enlargement is seen.

Mediastinum/Nodes: Thoracic inlet is within normal limits. No
sizable hilar or mediastinal adenopathy is noted. The esophagus is
within normal limits.

Lungs/Pleura: Right lung is well aerated with focal infiltrate in
the medial aspect of the right lower lobe consistent with acute
pneumonia. No associated effusion is noted. Diffuse emphysematous
changes are seen. In the left upper lobe, there is a somewhat
rounded spiculated lesion which measures approximately 2.7 by a
cm in dimension. A focal 5 mm upper lobe nodule is noted in the left
upper lobe as well best seen on image number 43 of series 8. Diffuse
emphysematous changes are noted. No sizable effusion is noted.

Upper Abdomen: Visualized upper abdomen shows no acute abnormality.

Musculoskeletal: No rib abnormality is noted. No lytic or sclerotic
lesion is seen.
IMPRESSION: Spiculated lesion in the left upper lobe with associated small
nodule suspicious for neoplasm till proven otherwise.
Multidisciplinary workup is recommended.

Focal infiltrate in the medial aspect of the right lower lobe.

Aortic Atherosclerosis (35CIY-GLQ.Q) and Emphysema (35CIY-BFL.F).

## 2023-11-26 IMAGING — CR DG CHEST 1V PORT
1 series · 1 of 1 positions shown · non-contrast
Comparison: June 19, 2021

CLINICAL DATA: 71-year-old female with LEFT lobe lesion post
bronchoscopy.

EXAM:
PORTABLE CHEST 1 VIEW

[AP]
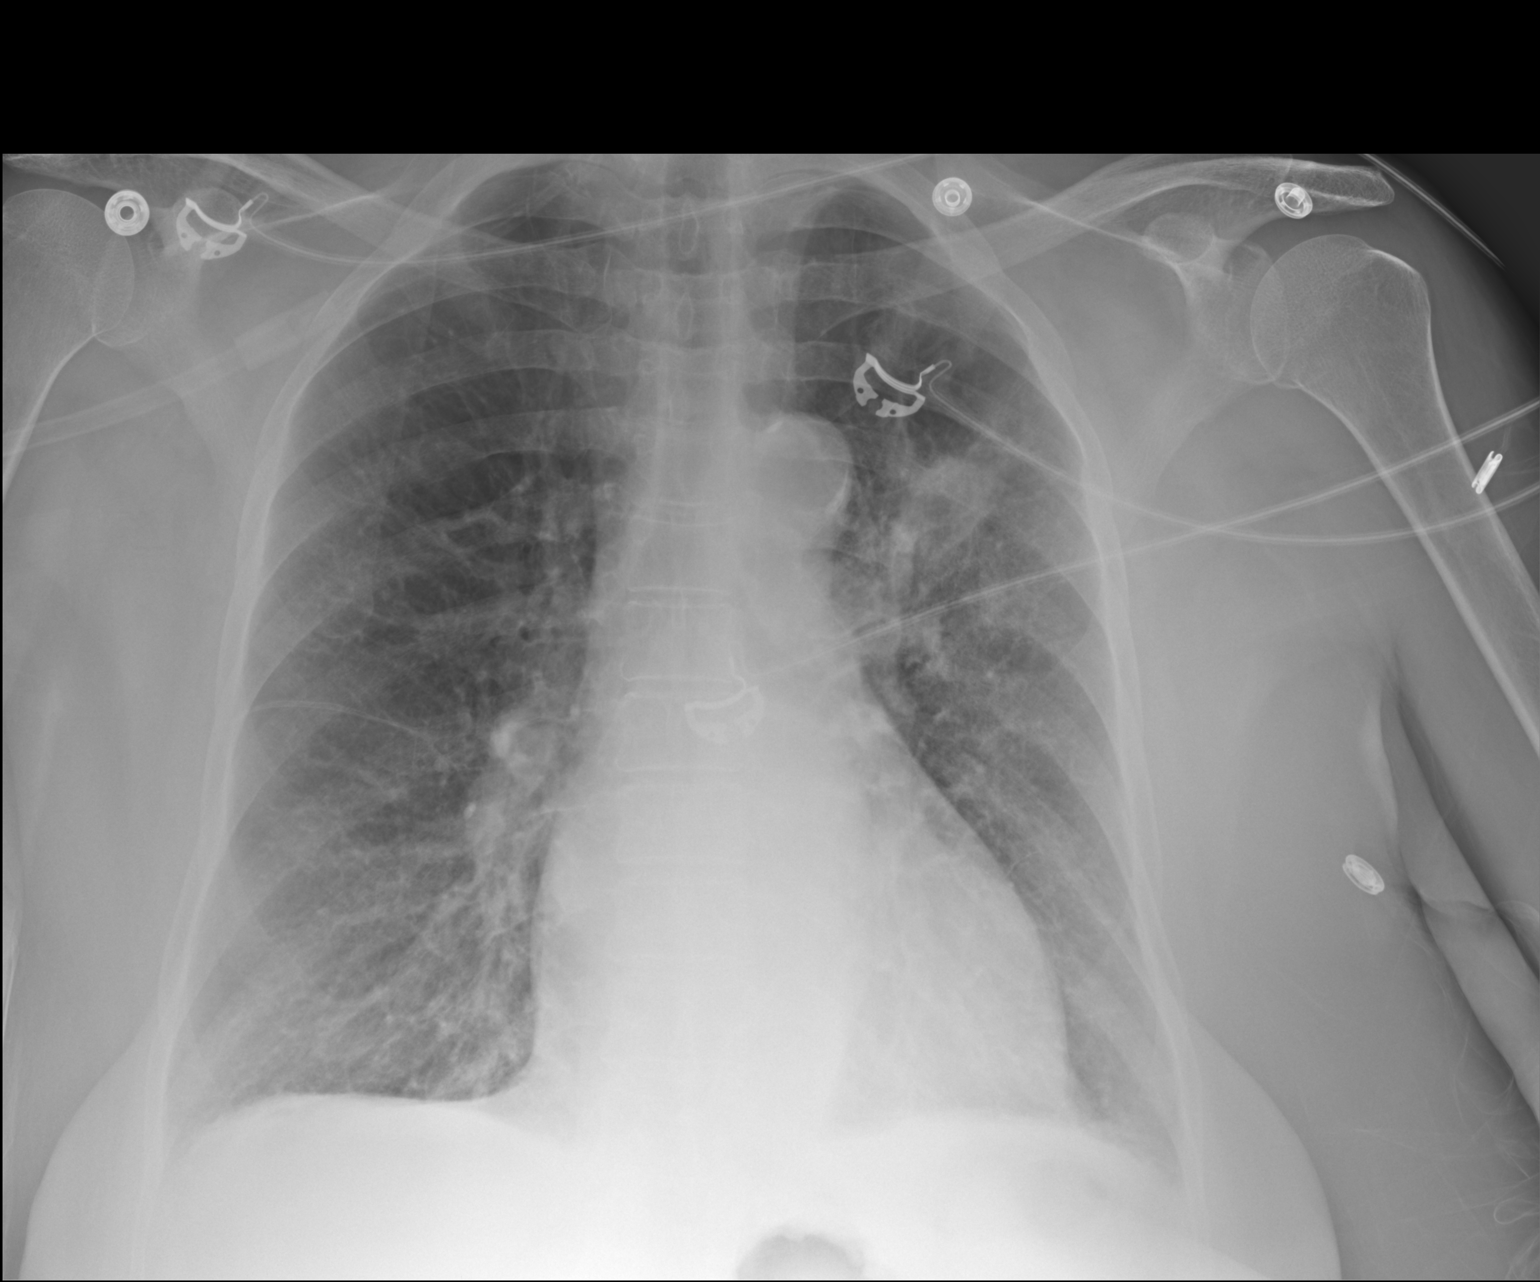

[1 of 1 positions shown; findings below may reference images not displayed]

FINDINGS: EKG leads project over the chest.

Trachea midline.

Cardiomediastinal contours are stable. LEFT supra hilar lesion more
conspicuous currently measuring up to 2.2 x 2.6 cm potentially
related to bronchoscopic biopsy changes.

Lungs are otherwise clear. No visible pneumothorax.

On limited assessment there is no acute skeletal finding.
IMPRESSION: LEFT supra hilar lesion more conspicuous currently potentially
related to bronchoscopic biopsy changes. No visible pneumothorax or
other complicating features.

## 2023-12-01 ENCOUNTER — Telehealth (HOSPITAL_COMMUNITY): Payer: Self-pay

## 2023-12-01 NOTE — Telephone Encounter (Signed)
 Surgical Clearance faxed via Epic

## 2023-12-01 NOTE — Telephone Encounter (Signed)
  ADVANCED HEART FAILURE CLINIC   Pre-operative Risk Assessment   HEARTCARE STAFF-IMPORTANT INSTRUCTIONS 1 Red and Blue Text will auto delete once note is signed or closed. 2 Press F2 to navigate through template.   3 On drop down lists, L click to select >> R click to activate next field 4 Reason for Visit format is IMPORTANT!!  See Directions on No. 2 below. 5 Please review chart to determine if there is already a clearance note open for this procedure!!  DO NOT duplicate if a note already exists!!    :1}      Request for Surgical Clearance    Procedure:  Cataract and Partial Cornea Transplant  Date of Surgery:  Clearance 12/11/23                                 Surgeon:   Surgeon's Group or Practice Name:  [facility]  Phone number:   Fax number:  [phone]   Type of Clearance Requested:   - Medical  - Pharmacy:  Hold Plavix  5 days prior, Farxiga  4 days prior Losartan  evening prior and spironolactone  morning of procedure.      Type of Anesthesia:  General    Additional requests/questions:  Please advise surgeon/provider what medications should be held. Please fax a copy of Clearance to the surgeon's office.  Signed, Lisa CHRISTELLA Sergeant   12/01/2023, 10:04 AM   Advanced Heart Failure Clinic Harlene Gainer, FNP [facility] [address] Heart and Vascular [place] [address] [phone] (office) [phone] (fax)

## 2023-12-22 ENCOUNTER — Encounter: Payer: Self-pay | Admitting: Radiology

## 2024-01-30 ENCOUNTER — Ambulatory Visit: Payer: Self-pay | Admitting: Pulmonary Disease

## 2024-01-30 NOTE — Telephone Encounter (Signed)
° °  FYI Only or Action Required?: Action required by provider: update on patient condition. Patient to UC today  Patient is followed in Pulmonology for asthma, COPD, last seen on 03/25/2022 by Kara Dorn NOVAK, MD.  Called Nurse Triage reporting Shortness of Breath and Cough.  Symptoms began several weeks ago.  Interventions attempted: Rescue inhaler.  Symptoms are: gradually worsening.  Triage Disposition: See HCP Within 4 Hours (Or PCP Triage)  Patient/caregiver understands and will follow disposition?: Yes Copied from CRM #[id number]. Topic: Clinical - Red Word Triage >> Jan 30, 2024  2:05 PM Corean SAUNDERS wrote: Red Word that prompted transfer to Nurse Triage: Patient states her breathing has become worse and has a bad cough. Reason for Disposition  Wheezing is present  Answer Assessment - Initial Assessment Questions 1. ONSET: When did the cough begin?      Two weeks ago, but has worsened over the past 2-3 days  3. SPUTUM: Describe the color of your sputum (e.g., none, dry cough; clear, white, yellow, green)     clear 4. HEMOPTYSIS: Are you coughing up any blood? If Yes, ask: How much? (e.g., flecks, streaks, tablespoons, etc.)     No blood seen, but can taste blood after coughing hard 5. DIFFICULTY BREATHING: Are you having difficulty breathing? If Yes, ask: How bad is it? (e.g., mild, moderate, severe)      Chest feels heavy and tight with breathing and causes cough 6. FEVER: Do you have a fever? If Yes, ask: What is your temperature, how was it measured, and when did it start?     denies 7. CARDIAC HISTORY: Do you have any history of heart disease? (e.g., heart attack, congestive heart failure)      HTN 8. LUNG HISTORY: Do you have any history of lung disease?  (e.g., pulmonary embolus, asthma, emphysema)     Asthma, COPD  10. OTHER SYMPTOMS: Do you have any other symptoms? (e.g., runny nose, wheezing, chest pain)       Nasal congestion,  wheezing  Protocols used: Cough - Acute Productive-A-AH

## 2024-01-30 NOTE — Telephone Encounter (Signed)
 Encoruaged patient to seek urgent care made an appointment for Monday at 1:00pm with Tammy parrett to address in office.NFN

## 2024-02-02 ENCOUNTER — Ambulatory Visit: Admitting: Adult Health

## 2024-02-09 ENCOUNTER — Encounter: Payer: Self-pay | Admitting: Pulmonary Disease

## 2024-02-09 ENCOUNTER — Ambulatory Visit: Admitting: Pulmonary Disease

## 2024-02-09 VITALS — BP 134/84 | HR 92 | Ht 67.0 in | Wt 162.2 lb

## 2024-02-09 DIAGNOSIS — J4540 Moderate persistent asthma, uncomplicated: Secondary | ICD-10-CM

## 2024-02-09 DIAGNOSIS — J441 Chronic obstructive pulmonary disease with (acute) exacerbation: Secondary | ICD-10-CM

## 2024-02-09 DIAGNOSIS — Z87891 Personal history of nicotine dependence: Secondary | ICD-10-CM

## 2024-02-09 MED ORDER — ALBUTEROL SULFATE HFA 108 (90 BASE) MCG/ACT IN AERS: 2.0000 | INHALATION_SPRAY | Freq: Four times a day (QID) | RESPIRATORY_TRACT | 11 refills | Status: AC | PRN

## 2024-02-09 MED ORDER — FLUTICASONE-SALMETEROL 500-50 MCG/ACT IN AEPB: 1.0000 | INHALATION_SPRAY | Freq: Two times a day (BID) | RESPIRATORY_TRACT | 6 refills | Status: AC

## 2024-02-09 MED ORDER — PREDNISONE 10 MG PO TABS: 40.0000 mg | ORAL_TABLET | Freq: Every day | ORAL | 0 refills | Status: AC

## 2024-02-09 MED ORDER — MONTELUKAST SODIUM 10 MG PO TABS: 10.0000 mg | ORAL_TABLET | Freq: Every day | ORAL | 11 refills | Status: AC

## 2024-02-09 MED ORDER — AZITHROMYCIN 250 MG PO TABS: ORAL_TABLET | ORAL | 0 refills | Status: DC

## 2024-02-09 NOTE — Assessment & Plan Note (Signed)
  Orders:   predniSONE  (DELTASONE ) 10 MG tablet; Take 4 tablets (40 mg total) by mouth daily with breakfast.   azithromycin  (ZITHROMAX ) 250 MG tablet; Take as directed

## 2024-02-09 NOTE — Patient Instructions (Addendum)
 Start prednisone  40mg  daily x 5 days  Start Zpak antibiotic for 5 days  Resume wixella inhaler 500-50mcg 1 puff twice daily -rinse mouth out after each use  Use albuterol  inhaler 1-2 puffs every 4-6 hours as needed  Follow up in 6 months, call sooner if needed

## 2024-02-09 NOTE — Progress Notes (Signed)
 "  Established Patient Pulmonology Office Visit   Subjective:  Patient ID: Shannon [name], female    DOB: [birth year]  MRN: [id number]  CC:  Chief Complaint  Patient presents with   Medical Management of Chronic Issues    Pt states SOB, cough x 3 weeks     Discussed the use of AI scribe software for clinical note transcription with the patient, who gave verbal consent to proceed.  History of Present Illness Shannon [name] is a 73 year old female with COPD who presents with increased coughing and shortness of breath.  She has had worsening cough and shortness of breath. The cough is worse in the morning, persists through the day, and is productive of clear mucus. She notes increased wheezing over the past couple of weeks that is disturbing her sleep.  She has COPD and previously used Wixela one puff twice daily, but stopped in January when it ran out. She now uses albuterol  frequently as needed and her current inhaler is expired.  She had a bronchoscopy on February 27th with 4R negative for malignancy.  Her medical history includes COPD, asthma, hypertension, and GERD. She is a former smoker.        ROS   Current Medications[1]      Objective:  BP 134/84   Pulse 92   Ht 5' 7 (1.702 m) Comment: PER PT  Wt 162 lb 3.2 oz (73.6 kg)   SpO2 96%   BMI 25.40 kg/m     Physical Exam Constitutional:      General: She is not in acute distress.    Appearance: Normal appearance.  Eyes:     General: No scleral icterus.    Conjunctiva/sclera: Conjunctivae normal.  Cardiovascular:     Rate and Rhythm: Normal rate and regular rhythm.  Pulmonary:     Breath sounds: Wheezing present. No rhonchi or rales.  Musculoskeletal:     Right lower leg: No edema.     Left lower leg: No edema.  Skin:    General: Skin is warm and dry.  Neurological:     General: No focal deficit present.      Diagnostic Review:  Last CBC Lab Results  Component Value Date   WBC 7.1  11/07/2022   HGB 13.4 11/07/2022   HCT 40.8 11/07/2022   MCV 91 11/07/2022   MCH 30.0 11/07/2022   RDW 17.5 (H) 11/07/2022   PLT 331 11/07/2022   Last metabolic panel Lab Results  Component Value Date   GLUCOSE 107 (H) 10/29/2023   NA 138 10/29/2023   K 4.0 10/29/2023   CL 106 10/29/2023   CO2 23 10/29/2023   BUN 16 10/29/2023   CREATININE 1.25 (H) 10/29/2023   GFRNONAA 45 (L) 10/29/2023   CALCIUM  9.6 10/29/2023   PHOS 2.6 05/11/2022   PROT 6.7 05/03/2022   ALBUMIN  1.8 (L) 05/10/2022   LABGLOB 2.4 02/26/2022   AGRATIO 1.8 02/26/2022   BILITOT 1.6 (H) 05/03/2022   ALKPHOS 94 05/03/2022   AST 28 05/03/2022   ALT 21 05/03/2022   ANIONGAP 9 10/29/2023       Assessment & Plan:   Assessment & Plan COPD with acute exacerbation (HCC)  Orders:   predniSONE  (DELTASONE ) 10 MG tablet; Take 4 tablets (40 mg total) by mouth daily with breakfast.   azithromycin  (ZITHROMAX ) 250 MG tablet; Take as directed  Moderate persistent asthma without complication  Orders:   fluticasone -salmeterol (WIXELA INHUB) 500-50 MCG/ACT AEPB; Inhale 1 puff into the  lungs in the morning and at bedtime.   montelukast  (SINGULAIR ) 10 MG tablet; Take 1 tablet (10 mg total) by mouth at bedtime.   albuterol  (VENTOLIN  HFA) 108 (90 Base) MCG/ACT inhaler; Inhale 2 puffs into the lungs every 6 (six) hours as needed for wheezing or shortness of breath.   Assessment and Plan Assessment & Plan COPD with acute exacerbation Increased coughing, wheezing, and nocturnal symptoms. Clear mucus production. Symptoms indicate need for maintenance and acute management. - Restart Wixela inhaler, one puff twice daily. - Prescribed prednisone  40 mg daily for 5 days. - Prescribed azithromycin  (Z-Pak), two tablets on the first day, then one tablet daily for the next four days. - Refilled albuterol  inhaler prescription.  Moderate persistent asthma Increased use of albuterol  inhaler. Previous use of montelukast  and Flonase   nasal spray for allergy symptoms. - Refilled montelukast  prescription. - Continue Flonase  nasal spray as needed for allergy symptoms.      No follow-ups on file.   Dorn KATHEE Chill, MD     [1]  Current Outpatient Medications:    albuterol  (PROVENTIL ) (2.5 MG/3ML) 0.083% nebulizer solution, Take 3 mLs (2.5 mg total) by nebulization every 6 (six) hours as needed for wheezing or shortness of breath., Disp: 75 mL, Rfl: 12   aspirin  EC 81 MG tablet, Take 1 tablet (81 mg total) by mouth daily. Swallow whole., Disp: 30 tablet, Rfl: 12   atorvastatin  (LIPITOR ) 80 MG tablet, Take 1 tablet (80 mg total) by mouth daily at 6 PM., Disp: 30 tablet, Rfl: 2   Cholecalciferol (VITAMIN D ) 50 MCG (2000 UT) tablet, Take 2,000 Units by mouth daily., Disp: , Rfl:    clopidogrel  (PLAVIX ) 75 MG tablet, TAKE 1 TABLET BY MOUTH EVERY DAY, Disp: 90 tablet, Rfl: 3   desloratadine (CLARINEX) 5 MG tablet, Take 5 mg by mouth daily., Disp: , Rfl:    ezetimibe  (ZETIA ) 10 MG tablet, Take 10 mg by mouth daily., Disp: , Rfl:    FARXIGA  10 MG TABS tablet, TAKE 1 TABLET BY MOUTH EVERY DAY, Disp: 90 tablet, Rfl: 3   FLUoxetine  (PROZAC ) 20 MG capsule, Take 20 mg by mouth daily., Disp: , Rfl:    fluticasone  (FLONASE ) 50 MCG/ACT nasal spray, Place 1 spray into both nostrils as needed for allergies or rhinitis., Disp: , Rfl:    fluticasone -salmeterol (WIXELA INHUB) 500-50 MCG/ACT AEPB, Inhale 1 puff into the lungs in the morning and at bedtime., Disp: 60 each, Rfl: 6   KLOR-CON  M20 20 MEQ tablet, TAKE 1 TABLET BY MOUTH EVERY DAY, Disp: 90 tablet, Rfl: 3   losartan  (COZAAR ) 25 MG tablet, Take 25 mg by mouth at bedtime., Disp: , Rfl:    montelukast  (SINGULAIR ) 10 MG tablet, Take 1 tablet (10 mg total) by mouth at bedtime., Disp: 30 tablet, Rfl: 11   pantoprazole  (PROTONIX ) 40 MG tablet, TAKE 1 TABLET BY MOUTH EVERY DAY, Disp: 90 tablet, Rfl: 1   spironolactone  (ALDACTONE ) 25 MG tablet, TAKE 1 TABLET (25 MG TOTAL) BY MOUTH DAILY.,  Disp: 90 tablet, Rfl: 3   furosemide  (LASIX ) 20 MG tablet, Take 1 tablet (20 mg total) by mouth as needed. For weight gain of 3 lbs in 24 hours or 5 lbs in a week (Patient not taking: Reported on 02/09/2024), Disp: 90 tablet, Rfl: 3  "

## 2024-02-16 ENCOUNTER — Encounter: Payer: Self-pay | Admitting: Pulmonary Disease

## 2024-02-27 ENCOUNTER — Telehealth (HOSPITAL_COMMUNITY): Payer: Self-pay

## 2024-02-27 NOTE — Progress Notes (Signed)
 "  ADVANCED HF CLINIC NOTE   Primary Care: Duwaine Annabella SAILOR, FNP Nephrology: Dr. Perri HF Cardiologist: Dr. Rolan  HPI: 74 y.o. female w/ h/o chronic diastolic heart failure, HTN, HLD, h/o TIA, GERD and asthma. Former smoker (quit 74 y/o). Noted to have high coronary calcium  score of 507 in March 2022. Referred to lipid clinic for aggressive tx of HLD but did not undergo cath in absence of symptoms. Echo 04/2018 showed normal LVEF and RV. No significant valvular dysfunction.    Recently referred back to cardiology for surgical clearance prior to undergoing planned EGD and EBUS (ordered given progressive dysphagia, wt loss and abnormal chest CT). She endorsed exertional dyspnea w/ inability to complete of activity w/o symptoms. Echo was done and showed drop in EF down to 40-45% w/ diffuse HK, severe LAE mod-severe MR, severely elevated RVSP, RV normal, mod-severe RAE and mod-severe TR. Subsequently referred for [facility].    [facility] 1/24 showed no significant CAD, only 10% pRCA narrowing, elevated R+ L heart filling pressures and marginal output, (PA saturation 48%, mean RA pressure 18 mmHg, PA pressure 50/28, mean PA pressure 37 mmHg, pulmonary capillary wedge pressure 35/36, mean pulmonary capillary wedge pressure 30 mmHg, cardiac output 3.92 L/min, cardiac index 2.27).  She was direct admitted from the cath lab for IV diuresis. TEE showed EF 30-35%, mild RV dysfunction, severe MR, moderate TR, moderate AI. Cardiac MRI showed LV EF 18%, RV EF 25%, ?severe AI, moderate-severe MR (regurgitant fraction not likely accurate with low SV), non-coronary mid-wall LGE in the basal septal wall.  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  She had dysphagia and underwent EGD/colonoscopy showing esophageal candidiasis, unremarkable colonoscopy. Hospitalization also complicated by episode of SVT requiring adenosine . Her GDMT was titrated, but limited by soft BP.   She was discharged home, weight 134 lbs.  Admitted 3/24 for scheduled MVR/AVR for severe MR, moderate AI and TR. S/p successful bioprosthetic MVR/AVR. AHF consulted for management of HF. Post op echo in 3/24 showed EF 30-35%, RV mod reduced, stable AV and MV prosthesis. After pressors/inotropes weaned off, GDMT titrated but limited by soft BPs. Had accelerated junction rhythm after surgery and started on amiodarone . She was discharged home, weight 137.5 lbs.  Echo in 6/24 showed EF 45-50%, normal RV, moderate TR, bioprosthetic MV with mean gradient 4.5 mmHg, bioprosthetic aortic valve with mean gradient 15 mmHg.   Echo 9/25 showed EF 50%, septal-lateral dyssynchrony, mild RV enlargement/mild RV dysfunction, bioprosthetic MV with mean gradient 3 and no significant MR, bioprosthetic AoV with mean gradient 10 mmHg, moderate TR, IVC normal in size.   Today she returns for HF follow up. Overall feeling fair,. she is more SOB today. Recently treated for COPD exacerbation, finished her steroid and abx. She is SOB walking short distances. Legs feel weak, she is afraid she is going to fall. Denies  palpitations, abnormal bleeding, CP, edema, or PND/Orthopnea. Appetite ok. Weight at home 170 pounds. Taking all medications.   ReDs reading: 32 %, normal  ECG (personally reviewed): SR with 1AVB, PVCs  Labs (4/25): LDL 92 Labs (7/25): K 4.8, creatinine 1.5 Labs (9/25): K 4.0, creatinine 1.25  PMH: 1. TIA/CVA: She has been on Plavix .  2. COPD: Prior smoker.  3. Mediastinal lymphadenopathy: PET-CT with hypermetabolic lymphadenopathy, ?lymphoproliferative disorder vs primary bronchogenic carcinoma vs sarcoidosis.  4. CKD stage 3 5. SVT: Occurred in 1/24, required adenosine .  6. Chronic systolic CHF:  Nonischemic cardiomyopathy.  - Echo (3/20): EF  50-55%, RV normal, mild MR - Echo (1/24): EF 40-45%, Severe LAE, mod-severe MR, severely elevated RVSP, RV normal, mod-severe RAE, mod-severe TR  - TEE (1/24): EF  30-35%, severe MR, moderate TR, moderate AI - Cardiac MRI (1/24): LVEF 18%, RVEF 25%, moderate AI visually but regurgitant fraction 49% suggests severe AI, moderate-severe MR,  non-coronary mid-wall LGE in the basal septal wall, cannot rule out cardiac sarcoidosis (unexplained mediastinal LAN) - R/LHC (1/24): Mild nonobstructive CAD; RA mean 18, PA 50/28 (mean 37), PCWP 30, CO/CI: 3.92/ 2.27 - Post Op echo (3/24): EF 30-35%, RV moderately reduced - Echo (6/24): EF 45-50%, normal RV, moderate TR, bioprosthetic MV with mean gradient 4.5 mmHg, bioprosthetic aortic valve with mean gradient 15 mmHg.  - Echo (9/25): EF 50%, septal-lateral dyssynchrony, mild RV enlargement/mild RV dysfunction, bioprosthetic MV with mean gradient 3 and no significant MR, bioprosthetic AoV with mean gradient 10 mmHg, moderate TR, IVC normal in size.  7. Valvular heart disease:  TEE in 1/24 showed severe MR.  The valve is abnormal with thickening of the leaflet tips and poor coapatation, ?underlying rheumatic or inflammatory etiology though not classic appearance for rheumatic heart disease and no mitral stenosis. Difficult to quantify mitral regurgitation by MRI due to low stroke volume (do not think regurgitant fraction is accurate). The aortic valve is also abnormal with thickening and doming.  Aortic insufficiency was moderate by TEE though regurgitant volume by cMRI suggested severe AI (I still suspect truly in moderate range).  - s/p bioprosthetic MVR and AVR 3/24 - Echo (3/24, post-op): EF 30-35%, RV mod reduced, stable AV and MV prosthesis. - Echo (9/25): Stable bioprosthetic aortic and mitral valves, moderate TR.  8. GERD  Review of Systems: All systems reviewed and negative except as per HPI.   Current Outpatient Medications  Medication Sig Dispense Refill   albuterol  (PROVENTIL ) (2.5 MG/3ML) 0.083% nebulizer solution Take 3 mLs (2.5 mg total) by nebulization every 6 (six) hours as needed for wheezing or shortness of  breath. 75 mL 12   albuterol  (VENTOLIN  HFA) 108 (90 Base) MCG/ACT inhaler Inhale 2 puffs into the lungs every 6 (six) hours as needed for wheezing or shortness of breath. 8.5 g 11   aspirin  EC 81 MG tablet Take 1 tablet (81 mg total) by mouth daily. Swallow whole. 30 tablet 12   atorvastatin  (LIPITOR ) 80 MG tablet Take 1 tablet (80 mg total) by mouth daily at 6 PM. 30 tablet 2   Cholecalciferol (VITAMIN D ) 50 MCG (2000 UT) tablet Take 2,000 Units by mouth daily.     clopidogrel  (PLAVIX ) 75 MG tablet TAKE 1 TABLET BY MOUTH EVERY DAY 90 tablet 3   desloratadine (CLARINEX) 5 MG tablet Take 5 mg by mouth daily.     ezetimibe  (ZETIA ) 10 MG tablet Take 10 mg by mouth daily.     FARXIGA  10 MG TABS tablet TAKE 1 TABLET BY MOUTH EVERY DAY 90 tablet 3   FLUoxetine  (PROZAC ) 20 MG capsule Take 20 mg by mouth daily.     fluticasone  (FLONASE ) 50 MCG/ACT nasal spray Place 1 spray into both nostrils as needed for allergies or rhinitis.     fluticasone -salmeterol (WIXELA INHUB) 500-50 MCG/ACT AEPB Inhale 1 puff into the lungs in the morning and at bedtime. 60 each 6   losartan  (COZAAR ) 25 MG tablet Take 25 mg by mouth at bedtime.     montelukast  (SINGULAIR ) 10 MG tablet Take 1 tablet (10 mg total) by mouth at bedtime. 30 tablet 11  pantoprazole  (PROTONIX ) 40 MG tablet TAKE 1 TABLET BY MOUTH EVERY DAY 90 tablet 1   predniSONE  (DELTASONE ) 10 MG tablet Take 4 tablets (40 mg total) by mouth daily with breakfast. 20 tablet 0   spironolactone  (ALDACTONE ) 25 MG tablet TAKE 1 TABLET (25 MG TOTAL) BY MOUTH DAILY. 90 tablet 3   furosemide  (LASIX ) 20 MG tablet Take 1 tablet (20 mg total) by mouth as needed. For weight gain of 3 lbs in 24 hours or 5 lbs in a week (Patient not taking: Reported on 03/01/2024) 90 tablet 3   KLOR-CON  M20 20 MEQ tablet TAKE 1 TABLET BY MOUTH EVERY DAY (Patient not taking: Reported on 03/01/2024) 90 tablet 3   No current facility-administered medications for this encounter.   Allergies  Allergen  Reactions   Amoxicillin-Pot Clavulanate Itching   Lactose Intolerance (Gi) Diarrhea   Other Other (See Comments)    08/06/2018 -    06/05/2016 -    09/15/2014 -    06/16/2014 -   Social History   Socioeconomic History   Marital status: Widowed    Spouse name: Not on file   Number of children: Not on file   Years of education: Not on file   Highest education level: Not on file  Occupational History   Not on file  Tobacco Use   Smoking status: Former    Current packs/day: 0.00    Average packs/day: 0.5 packs/day for 30.0 years (15.0 ttl pk-yrs)    Types: Cigarettes    Start date: 11/09/1968    Quit date: 11/10/1998    Years since quitting: 25.3   Smokeless tobacco: Never  Vaping Use   Vaping status: Never Used  Substance and Sexual Activity   Alcohol use: Yes    Comment: occasional   Drug use: No   Sexual activity: Not Currently  Other Topics Concern   Not on file  Social History Narrative   Not on file   Social Drivers of Health   Tobacco Use: Medium Risk (02/16/2024)   Patient History    Smoking Tobacco Use: Former    Smokeless Tobacco Use: Never    Passive Exposure: Not on file  Financial Resource Strain: Not at Risk (12/19/2022)   Received from [employer]    How hard is it for you to pay for the very basics like food, housing, heating, medical care, and medications?: 1  Food Insecurity: Not at Risk (12/19/2022)   Received from [facility] Insecurity    Within the past 12 months, you worried that your food would run out before you got money to buy more.: 1  Transportation Needs: Not at Risk (12/19/2022)   Received from [employer] Needs    In the past 12 months, has lack of transportation kept you from medical appointments, meetings, work or from getting things needed for daily living?: 1  Physical Activity: Not on File (06/07/2021)   Received from [facility]   Physical Activity    Physical Activity: 0  Stress: Not on File  (06/07/2021)   Received from [facility]   Stress    Stress: 0  Social Connections: Not on File (11/02/2022)   Received from [facility]   Social Connections    Connectedness: 0  Intimate Partner Violence: Not At Risk (05/09/2022)   Humiliation, Afraid, Rape, and Kick questionnaire    Fear of Current or Ex-Partner: No    Emotionally Abused: No    Physically Abused: No  Sexually Abused: No  Depression (PHQ2-9): Not on file  Alcohol Screen: Not on file  Housing: Low Risk (05/09/2022)   Housing    Last Housing Risk Score: 0  Utilities: Not At Risk (05/09/2022)   [facility] Utilities    Threatened with loss of utilities: No  Health Literacy: Not on file   Family History  Problem Relation Age of Onset   Hypertension Mother    Hypertension Father    Colon cancer Father    Hypertension Sister    Wt Readings from Last 3 Encounters:  03/01/24 77.7 kg (171 lb 6.4 oz)  02/09/24 73.6 kg (162 lb 3.2 oz)  10/29/23 71.9 kg (158 lb 9.6 oz)   BP (!) 150/110 (BP Location: Left Arm, Patient Position: Sitting, Cuff Size: Normal)   Pulse 97   Resp 16   Wt 77.7 kg (171 lb 6.4 oz)   SpO2 98%   BMI 26.85 kg/m   PHYSICAL EXAM: General:  NAD. No resp difficulty, walked into clinic HEENT: Normal Neck: Supple. No JVD. Cor: Regular rate & rhythm. No rubs, gallops or murmurs. Lungs: Clear, diminished in bases Abdomen: Soft, nontender, nondistended.  Extremities: No cyanosis, clubbing, rash, edema Neuro: Alert & oriented x 3, moves all 4 extremities w/o difficulty. Affect pleasant.   ASSESSMENT & PLAN: 1. Valvular heart disease: TEE in 1/24 showed severe MR.  The valve was abnormal with thickening of the leaflet tips and poor coapatation, ?underlying rheumatic or inflammatory etiology though not classic appearance for rheumatic heart disease and no mitral stenosis. The aortic valve was also abnormal with thickening and doming.  Aortic insufficiency was moderate by TEE though regurgitant volume by cMRI suggested  severe AI. The structural heart team reviewed TEE images and felt she is not suitable for mTEER, so she underwent bioprosthetic MVR/AVR 05/07/22.  Echo  9/25 showed stable bioprosthetic aortic and mitral valves with normal function, there was probably moderate TR.  - Will need SBE prophylaxis with dental work. - Continue ASA 81 and Plavix  (was on Plavix  pre-op for CVA).  2. Chronic Systolic CHF: Nonischemic cardiomyopathy.  Echo in 3/20 showed low normal EF 50-55%, no significant MR. Echo in 1/24 EF 40-45%, mild RV dilation, moderate pulmonary HTN, mod-severe MR and moderate-severe TR. RHC/LHC (1/24) with elevated left and right heart filling pressures, pulmonary venous hypertension, preserved cardiac output.  TEE done 03/01/22 showed EF 30-35%, mild RV dysfunction, severe MR, moderate TR, moderate AI.  Cardiac MRI 1/12 showed LV EF 18%, RV EF 25%, ? severe AI, moderate-severe MR (regurgitant fraction not likely accurate with low SV), non-coronary mid-wall LGE in the basal septal wall.  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 Overall, NICM was probably primarily due to valvular heart disease.  Post-op Echo (3/24) showed EF 30-35%, RV mod reduced. Echo in 6/24 showed improved EF 45-50%, normal RV, moderate TR, bioprosthetic MV with mean gradient 4.5 mmHg, bioprosthetic aortic valve with mean gradient 15 mmHg.  Echo 9/25 showed EF 50%, septal-lateral dyssynchrony, mild RV enlargement/mild RV dysfunction, bioprosthetic MV with mean gradient 3 and no significant MR, bioprosthetic AoV with mean gradient 10 mmHg, moderate TR, IVC normal in size.  NYHA II-early III, not volume overloaded on exam. ReDs 32%. Suspect current symptoms more pulmonary-driven (she has moderate asthma). - She does not appear to need Lasix .  - Continue Farxiga  10 mg daily. - Continue losartan  25 mg daily, BP did not tolerate Entresto  in the past.  - Continue spironolactone  25 mg  daily.   BMET/BNP today.  - Hold off on beta blocker with 1st degree AVB, PR 232 msec today. 3. Pulmonary hypertension: Group 2 pulmonary venous hypertension.  4. H/o TIA/CVA:On Plavix  long term. - Continue Plavix  + ASA 81.  5. CKD stage 3: Baseline SCr 1.3-1.5 - Continue Farxiga . BMET today. - She has been referred to [facility]  6. H/O Accelerated junctional rhythm: Post-Op. NSR on ECG today.  7. PVCs: feels palpitations. Several PVCs on ECG today. She does not snore. - Place 2 week Zio to quantify burden. - Check BMET, TSH, CBC and iron panel today. 8. Elevated BP: BP elevated in clinic today, but generally well-controlled at home. - Continue current medications - Check BP at home and log  Follow up in 4 months with Dr. Rolan Raisin [facility] FNP-BC 03/01/2024 "

## 2024-02-27 NOTE — Telephone Encounter (Signed)
 Called to confirm/remind patient of their appointment at the [facility] on 03/02/23.   Appointment:   [x] Confirmed  [] Left mess   [] No answer/No voice mail  [] VM Full/unable to leave message  [] Phone not in service  Patient reminded to bring all medications and/or complete list.  Confirmed patient has transportation. Gave directions, instructed to utilize valet parking.

## 2024-03-01 ENCOUNTER — Ambulatory Visit (HOSPITAL_COMMUNITY)
Admission: RE | Admit: 2024-03-01 | Discharge: 2024-03-01 | Disposition: A | Source: Ambulatory Visit | Attending: Cardiology | Admitting: Cardiology

## 2024-03-01 ENCOUNTER — Other Ambulatory Visit (HOSPITAL_COMMUNITY): Payer: Self-pay | Admitting: Cardiology

## 2024-03-01 ENCOUNTER — Ambulatory Visit (HOSPITAL_COMMUNITY)
Admission: RE | Admit: 2024-03-01 | Discharge: 2024-03-01 | Disposition: A | Source: Ambulatory Visit | Attending: Family Medicine

## 2024-03-01 ENCOUNTER — Encounter (HOSPITAL_COMMUNITY): Payer: Self-pay

## 2024-03-01 VITALS — BP 150/110 | HR 97 | Resp 16 | Wt 171.4 lb

## 2024-03-01 DIAGNOSIS — R030 Elevated blood-pressure reading, without diagnosis of hypertension: Secondary | ICD-10-CM | POA: Diagnosis not present

## 2024-03-01 DIAGNOSIS — R002 Palpitations: Secondary | ICD-10-CM

## 2024-03-01 DIAGNOSIS — N1831 Chronic kidney disease, stage 3a: Secondary | ICD-10-CM

## 2024-03-01 DIAGNOSIS — Z952 Presence of prosthetic heart valve: Secondary | ICD-10-CM

## 2024-03-01 DIAGNOSIS — I428 Other cardiomyopathies: Secondary | ICD-10-CM | POA: Insufficient documentation

## 2024-03-01 DIAGNOSIS — N183 Chronic kidney disease, stage 3 unspecified: Secondary | ICD-10-CM | POA: Diagnosis not present

## 2024-03-01 DIAGNOSIS — J4489 Other specified chronic obstructive pulmonary disease: Secondary | ICD-10-CM | POA: Insufficient documentation

## 2024-03-01 DIAGNOSIS — I130 Hypertensive heart and chronic kidney disease with heart failure and stage 1 through stage 4 chronic kidney disease, or unspecified chronic kidney disease: Secondary | ICD-10-CM | POA: Insufficient documentation

## 2024-03-01 DIAGNOSIS — E785 Hyperlipidemia, unspecified: Secondary | ICD-10-CM | POA: Diagnosis not present

## 2024-03-01 DIAGNOSIS — I493 Ventricular premature depolarization: Secondary | ICD-10-CM

## 2024-03-01 DIAGNOSIS — I272 Pulmonary hypertension, unspecified: Secondary | ICD-10-CM | POA: Insufficient documentation

## 2024-03-01 DIAGNOSIS — Z7902 Long term (current) use of antithrombotics/antiplatelets: Secondary | ICD-10-CM | POA: Diagnosis not present

## 2024-03-01 DIAGNOSIS — Z7984 Long term (current) use of oral hypoglycemic drugs: Secondary | ICD-10-CM | POA: Insufficient documentation

## 2024-03-01 DIAGNOSIS — Z7982 Long term (current) use of aspirin: Secondary | ICD-10-CM | POA: Diagnosis not present

## 2024-03-01 DIAGNOSIS — Z953 Presence of xenogenic heart valve: Secondary | ICD-10-CM | POA: Insufficient documentation

## 2024-03-01 DIAGNOSIS — R0602 Shortness of breath: Secondary | ICD-10-CM | POA: Insufficient documentation

## 2024-03-01 DIAGNOSIS — Z79899 Other long term (current) drug therapy: Secondary | ICD-10-CM | POA: Diagnosis not present

## 2024-03-01 DIAGNOSIS — Z8673 Personal history of transient ischemic attack (TIA), and cerebral infarction without residual deficits: Secondary | ICD-10-CM | POA: Insufficient documentation

## 2024-03-01 DIAGNOSIS — Z87891 Personal history of nicotine dependence: Secondary | ICD-10-CM | POA: Diagnosis not present

## 2024-03-01 DIAGNOSIS — R531 Weakness: Secondary | ICD-10-CM | POA: Insufficient documentation

## 2024-03-01 DIAGNOSIS — I5022 Chronic systolic (congestive) heart failure: Secondary | ICD-10-CM | POA: Insufficient documentation

## 2024-03-01 DIAGNOSIS — G459 Transient cerebral ischemic attack, unspecified: Secondary | ICD-10-CM | POA: Diagnosis not present

## 2024-03-01 DIAGNOSIS — I5032 Chronic diastolic (congestive) heart failure: Secondary | ICD-10-CM | POA: Diagnosis present

## 2024-03-01 LAB — BASIC METABOLIC PANEL WITH GFR
Anion gap: 12 (ref 5–15)
BUN: 18 mg/dL (ref 8–23)
CO2: 24 mmol/L (ref 22–32)
Calcium: 9.1 mg/dL (ref 8.9–10.3)
Chloride: 112 mmol/L — ABNORMAL HIGH (ref 98–111)
Creatinine, Ser: 1.32 mg/dL — ABNORMAL HIGH (ref 0.44–1.00)
GFR, Estimated: 42 mL/min — ABNORMAL LOW
Glucose, Bld: 87 mg/dL (ref 70–99)
Potassium: 3.9 mmol/L (ref 3.5–5.1)
Sodium: 147 mmol/L — ABNORMAL HIGH (ref 135–145)

## 2024-03-01 LAB — IRON AND TIBC
Iron: 43 ug/dL (ref 28–170)
Saturation Ratios: 11 % (ref 10.4–31.8)
TIBC: 386 ug/dL (ref 250–450)
UIBC: 344 ug/dL

## 2024-03-01 LAB — CBC
HCT: 39.3 % (ref 36.0–46.0)
Hemoglobin: 12.5 g/dL (ref 12.0–15.0)
MCH: 30.8 pg (ref 26.0–34.0)
MCHC: 31.8 g/dL (ref 30.0–36.0)
MCV: 96.8 fL (ref 80.0–100.0)
Platelets: 201 K/uL (ref 150–400)
RBC: 4.06 MIL/uL (ref 3.87–5.11)
RDW: 16.4 % — ABNORMAL HIGH (ref 11.5–15.5)
WBC: 5.1 K/uL (ref 4.0–10.5)
nRBC: 0 % (ref 0.0–0.2)

## 2024-03-01 LAB — TSH: TSH: 0.651 u[IU]/mL (ref 0.350–4.500)

## 2024-03-01 LAB — FERRITIN: Ferritin: 55 ng/mL (ref 11–307)

## 2024-03-01 NOTE — Progress Notes (Signed)
"   ReDS Vest / Clip - 03/01/24 1500       ReDS Vest / Clip   Station Marker C    Ruler Value 30    ReDS Value Range Low volume    ReDS Actual Value 32          "

## 2024-03-01 NOTE — Patient Instructions (Addendum)
 Thank you for coming in today  If you had labs drawn today, any labs that are abnormal the clinic will call you No news is good news  Your provider has recommended that  you wear a Zio Patch for 14 days.  This monitor will record your heart rhythm for our review.  IF you have any symptoms while wearing the monitor please press the button.  If you have any issues with the patch or you notice a red or orange light on it please call the company at [phone].  Once you remove the patch please mail it back to the company as soon as possible so we can get the results.   Medications: No changes    Follow up appointments:  Your physician recommends that you schedule a follow-up appointment in:  6 months With Dr. Rolan Please call our office to schedule the follow-up appointment in May 2026 for July 2026.    Do the following things EVERYDAY: Weigh yourself in the morning before breakfast. Write it down and keep it in a log. Take your medicines as prescribed Eat low salt foods--Limit salt (sodium) to 2000 mg per day.  Stay as active as you can everyday Limit all fluids for the day to less than 2 liters   At the [facility], you and your health needs are our priority. As part of our continuing mission to provide you with exceptional heart care, we have created designated Provider Care Teams. These Care Teams include your primary Cardiologist (physician) and Advanced Practice Providers (APPs- Physician Assistants and Nurse Practitioners) who all work together to provide you with the care you need, when you need it.   You may see any of the following providers on your designated Care Team at your next follow up: Dr Toribio Fuel Dr Ezra Rolan Dr. Ria Gardenia Greig Lenetta, NP Caffie Shed, [place] [facility] [place], [place] Beckey Coe, NP Tinnie Redman, PharmD   Please be sure to bring in all your medications bottles to every appointment.    Thank  you for choosing [facility]  If you have any questions or concerns before your next appointment please send us  a message through [place] or call our office at [phone].    TO LEAVE A MESSAGE FOR THE NURSE SELECT OPTION 2, PLEASE LEAVE A MESSAGE INCLUDING: YOUR NAME DATE OF BIRTH CALL BACK NUMBER REASON FOR CALL**this is important as we prioritize the call backs  YOU WILL RECEIVE A CALL BACK THE SAME DAY AS LONG AS YOU CALL BEFORE 4:00 PM

## 2024-03-02 ENCOUNTER — Ambulatory Visit (HOSPITAL_COMMUNITY): Payer: Self-pay | Admitting: Family Medicine

## 2024-03-26 ENCOUNTER — Ambulatory Visit (HOSPITAL_COMMUNITY): Payer: Self-pay | Admitting: Cardiology

## 2024-03-26 NOTE — Telephone Encounter (Addendum)
" °  Pt aware, agreeable, and verbalized understanding  ----- Message from Ezra Shuck, MD sent at 03/26/2024 10:33 AM EST ----- No worrisome arrhythmias.  "

## 2024-03-26 NOTE — Addendum Note (Signed)
 Encounter addended by: Debarah Garrison MATSU, RN on: 03/26/2024 10:31 AM  Actions taken: Imaging Exam ended
